# Patient Record
Sex: Male | Born: 1950
Health system: Southern US, Community
[De-identification: ages and names within clinical notes are randomized; demographics above are authoritative.]

## PROBLEM LIST (undated history)

## (undated) ENCOUNTER — Emergency Department: Discharge status: Absent without leave | Payer: PPO

## (undated) DIAGNOSIS — M1990 Unspecified osteoarthritis, unspecified site: Secondary | ICD-10-CM

## (undated) DIAGNOSIS — I340 Nonrheumatic mitral (valve) insufficiency: Secondary | ICD-10-CM

## (undated) DIAGNOSIS — M5030 Other cervical disc degeneration, unspecified cervical region: Secondary | ICD-10-CM

## (undated) DIAGNOSIS — M069 Rheumatoid arthritis, unspecified: Secondary | ICD-10-CM

## (undated) DIAGNOSIS — R771 Abnormality of globulin: Secondary | ICD-10-CM

## (undated) DIAGNOSIS — J45909 Unspecified asthma, uncomplicated: Secondary | ICD-10-CM

## (undated) DIAGNOSIS — N400 Enlarged prostate without lower urinary tract symptoms: Secondary | ICD-10-CM

## (undated) DIAGNOSIS — F325 Major depressive disorder, single episode, in full remission: Secondary | ICD-10-CM

## (undated) DIAGNOSIS — G43909 Migraine, unspecified, not intractable, without status migrainosus: Secondary | ICD-10-CM

## (undated) DIAGNOSIS — E782 Mixed hyperlipidemia: Secondary | ICD-10-CM

## (undated) DIAGNOSIS — G4733 Obstructive sleep apnea (adult) (pediatric): Secondary | ICD-10-CM

## (undated) DIAGNOSIS — F32A Depression, unspecified: Secondary | ICD-10-CM

## (undated) DIAGNOSIS — J439 Emphysema, unspecified: Secondary | ICD-10-CM

## (undated) DIAGNOSIS — F419 Anxiety disorder, unspecified: Secondary | ICD-10-CM

## (undated) DIAGNOSIS — M542 Cervicalgia: Secondary | ICD-10-CM

## (undated) DIAGNOSIS — J449 Chronic obstructive pulmonary disease, unspecified: Secondary | ICD-10-CM

## (undated) DIAGNOSIS — I10 Essential (primary) hypertension: Secondary | ICD-10-CM

## (undated) DIAGNOSIS — M48062 Spinal stenosis, lumbar region with neurogenic claudication: Secondary | ICD-10-CM

## (undated) DIAGNOSIS — F329 Major depressive disorder, single episode, unspecified: Secondary | ICD-10-CM

## (undated) DIAGNOSIS — I872 Venous insufficiency (chronic) (peripheral): Secondary | ICD-10-CM

## (undated) DIAGNOSIS — I2510 Atherosclerotic heart disease of native coronary artery without angina pectoris: Secondary | ICD-10-CM

## (undated) DIAGNOSIS — M5136 Other intervertebral disc degeneration, lumbar region: Secondary | ICD-10-CM

## (undated) DIAGNOSIS — M79673 Pain in unspecified foot: Secondary | ICD-10-CM

## (undated) DIAGNOSIS — M47816 Spondylosis without myelopathy or radiculopathy, lumbar region: Secondary | ICD-10-CM

## (undated) DIAGNOSIS — M1710 Unilateral primary osteoarthritis, unspecified knee: Secondary | ICD-10-CM

## (undated) HISTORY — DX: Obstructive sleep apnea (adult) (pediatric): G47.33

## (undated) HISTORY — DX: Cervicalgia: M54.2

## (undated) HISTORY — DX: Mixed hyperlipidemia: E78.2

## (undated) HISTORY — PX: OTHER SURGICAL HISTORY: SHX169

## (undated) HISTORY — PX: CARDIAC CATHETERIZATION: SHX172

## (undated) HISTORY — DX: Other cervical disc degeneration, unspecified cervical region: M50.30

## (undated) HISTORY — PX: NASAL SEPTUM SURGERY: SHX37

## (undated) HISTORY — DX: Essential (primary) hypertension: I10

## (undated) HISTORY — DX: Atherosclerotic heart disease of native coronary artery without angina pectoris: I25.10

## (undated) HISTORY — DX: Depression, unspecified: F32.A

## (undated) HISTORY — DX: Spondylosis without myelopathy or radiculopathy, lumbar region: M47.816

## (undated) HISTORY — DX: Unilateral primary osteoarthritis, unspecified knee: M17.10

## (undated) HISTORY — DX: Other intervertebral disc degeneration, lumbar region: M51.36

## (undated) HISTORY — DX: Major depressive disorder, single episode, in full remission: F32.5

## (undated) HISTORY — PX: CHOLECYSTECTOMY: SHX55

## (undated) HISTORY — DX: Spinal stenosis, lumbar region with neurogenic claudication: M48.062

## (undated) HISTORY — DX: Pain in unspecified foot: M79.673

## (undated) HISTORY — PX: KNEE SURGERY: SHX244

## (undated) HISTORY — DX: Major depressive disorder, single episode, unspecified: F32.9

## (undated) HISTORY — DX: Venous insufficiency (chronic) (peripheral): I87.2

## (undated) HISTORY — PX: COLONOSCOPY: SHX174

---

## 2002-03-12 ENCOUNTER — Encounter: Payer: Self-pay | Admitting: Cardiology

## 2002-03-12 ENCOUNTER — Encounter: Admission: RE | Admit: 2002-03-12 | Discharge: 2002-03-12 | Payer: Self-pay | Admitting: Cardiology

## 2004-06-27 ENCOUNTER — Ambulatory Visit: Payer: Self-pay | Admitting: Anesthesiology

## 2004-09-21 ENCOUNTER — Ambulatory Visit: Payer: Self-pay | Admitting: Anesthesiology

## 2004-10-04 ENCOUNTER — Ambulatory Visit: Payer: Self-pay | Admitting: Anesthesiology

## 2004-10-26 ENCOUNTER — Ambulatory Visit: Payer: Self-pay | Admitting: Anesthesiology

## 2004-11-02 ENCOUNTER — Ambulatory Visit: Payer: Self-pay | Admitting: Anesthesiology

## 2004-11-04 ENCOUNTER — Ambulatory Visit: Payer: Self-pay | Admitting: Gastroenterology

## 2004-11-24 ENCOUNTER — Ambulatory Visit: Payer: Self-pay | Admitting: Anesthesiology

## 2005-12-12 ENCOUNTER — Ambulatory Visit: Payer: Self-pay | Admitting: Unknown Physician Specialty

## 2006-08-22 ENCOUNTER — Ambulatory Visit: Payer: Self-pay | Admitting: Unknown Physician Specialty

## 2006-10-10 ENCOUNTER — Ambulatory Visit: Payer: Self-pay | Admitting: Unknown Physician Specialty

## 2008-04-24 ENCOUNTER — Ambulatory Visit: Payer: Self-pay | Admitting: Unknown Physician Specialty

## 2008-07-30 ENCOUNTER — Ambulatory Visit: Payer: Self-pay | Admitting: Internal Medicine

## 2008-09-01 ENCOUNTER — Ambulatory Visit: Payer: Self-pay | Admitting: Internal Medicine

## 2009-03-29 ENCOUNTER — Ambulatory Visit: Payer: Self-pay | Admitting: Physician Assistant

## 2009-06-29 ENCOUNTER — Ambulatory Visit: Payer: Self-pay | Admitting: Anesthesiology

## 2009-08-05 ENCOUNTER — Ambulatory Visit: Payer: Self-pay | Admitting: Anesthesiology

## 2009-08-26 ENCOUNTER — Ambulatory Visit: Payer: Self-pay | Admitting: Anesthesiology

## 2009-10-06 ENCOUNTER — Ambulatory Visit: Payer: Self-pay | Admitting: Anesthesiology

## 2010-11-23 ENCOUNTER — Ambulatory Visit: Payer: Self-pay

## 2010-11-29 ENCOUNTER — Ambulatory Visit: Payer: Self-pay | Admitting: Gastroenterology

## 2010-12-02 LAB — PATHOLOGY REPORT

## 2012-03-14 ENCOUNTER — Ambulatory Visit: Payer: Self-pay | Admitting: Internal Medicine

## 2014-02-16 DIAGNOSIS — E782 Mixed hyperlipidemia: Secondary | ICD-10-CM

## 2014-02-16 DIAGNOSIS — I2510 Atherosclerotic heart disease of native coronary artery without angina pectoris: Secondary | ICD-10-CM | POA: Insufficient documentation

## 2014-02-16 HISTORY — DX: Atherosclerotic heart disease of native coronary artery without angina pectoris: I25.10

## 2014-02-16 HISTORY — DX: Mixed hyperlipidemia: E78.2

## 2014-03-10 DIAGNOSIS — F325 Major depressive disorder, single episode, in full remission: Secondary | ICD-10-CM | POA: Insufficient documentation

## 2014-03-10 HISTORY — DX: Major depressive disorder, single episode, in full remission: F32.5

## 2014-04-13 DIAGNOSIS — R739 Hyperglycemia, unspecified: Secondary | ICD-10-CM | POA: Insufficient documentation

## 2014-04-13 DIAGNOSIS — E118 Type 2 diabetes mellitus with unspecified complications: Secondary | ICD-10-CM | POA: Insufficient documentation

## 2014-08-12 DIAGNOSIS — M179 Osteoarthritis of knee, unspecified: Secondary | ICD-10-CM

## 2014-08-12 DIAGNOSIS — M1710 Unilateral primary osteoarthritis, unspecified knee: Secondary | ICD-10-CM

## 2014-08-12 DIAGNOSIS — M1990 Unspecified osteoarthritis, unspecified site: Secondary | ICD-10-CM | POA: Insufficient documentation

## 2014-08-12 HISTORY — DX: Osteoarthritis of knee, unspecified: M17.9

## 2014-08-12 HISTORY — DX: Unilateral primary osteoarthritis, unspecified knee: M17.10

## 2015-01-25 ENCOUNTER — Other Ambulatory Visit: Payer: Self-pay | Admitting: Physical Medicine and Rehabilitation

## 2015-01-25 DIAGNOSIS — M5416 Radiculopathy, lumbar region: Secondary | ICD-10-CM

## 2015-02-05 ENCOUNTER — Ambulatory Visit
Admission: RE | Admit: 2015-02-05 | Discharge: 2015-02-05 | Disposition: A | Discharge status: Home / Self Care | Payer: PPO | Source: Ambulatory Visit | Attending: Physical Medicine and Rehabilitation | Admitting: Physical Medicine and Rehabilitation

## 2015-02-05 DIAGNOSIS — M5416 Radiculopathy, lumbar region: Secondary | ICD-10-CM

## 2015-02-05 DIAGNOSIS — M4806 Spinal stenosis, lumbar region: Secondary | ICD-10-CM | POA: Diagnosis not present

## 2015-02-05 DIAGNOSIS — M47896 Other spondylosis, lumbar region: Secondary | ICD-10-CM | POA: Insufficient documentation

## 2015-02-05 DIAGNOSIS — G544 Lumbosacral root disorders, not elsewhere classified: Secondary | ICD-10-CM | POA: Diagnosis not present

## 2015-02-24 DIAGNOSIS — Z0000 Encounter for general adult medical examination without abnormal findings: Secondary | ICD-10-CM | POA: Insufficient documentation

## 2015-02-24 DIAGNOSIS — Z79899 Other long term (current) drug therapy: Secondary | ICD-10-CM | POA: Insufficient documentation

## 2015-05-11 DIAGNOSIS — M5416 Radiculopathy, lumbar region: Secondary | ICD-10-CM | POA: Diagnosis not present

## 2015-05-11 DIAGNOSIS — M4806 Spinal stenosis, lumbar region: Secondary | ICD-10-CM | POA: Diagnosis not present

## 2015-05-11 DIAGNOSIS — M5136 Other intervertebral disc degeneration, lumbar region: Secondary | ICD-10-CM | POA: Diagnosis not present

## 2015-05-18 DIAGNOSIS — M47816 Spondylosis without myelopathy or radiculopathy, lumbar region: Secondary | ICD-10-CM | POA: Diagnosis not present

## 2015-05-18 DIAGNOSIS — M4806 Spinal stenosis, lumbar region: Secondary | ICD-10-CM | POA: Diagnosis not present

## 2015-05-18 DIAGNOSIS — M545 Low back pain: Secondary | ICD-10-CM | POA: Diagnosis not present

## 2015-05-18 DIAGNOSIS — M5136 Other intervertebral disc degeneration, lumbar region: Secondary | ICD-10-CM | POA: Diagnosis not present

## 2015-06-09 DIAGNOSIS — I25119 Atherosclerotic heart disease of native coronary artery with unspecified angina pectoris: Secondary | ICD-10-CM | POA: Diagnosis not present

## 2015-06-09 DIAGNOSIS — J069 Acute upper respiratory infection, unspecified: Secondary | ICD-10-CM | POA: Diagnosis not present

## 2015-06-24 DIAGNOSIS — R05 Cough: Secondary | ICD-10-CM | POA: Diagnosis not present

## 2015-06-24 DIAGNOSIS — E782 Mixed hyperlipidemia: Secondary | ICD-10-CM | POA: Diagnosis not present

## 2015-06-24 DIAGNOSIS — I10 Essential (primary) hypertension: Secondary | ICD-10-CM

## 2015-06-24 DIAGNOSIS — I2510 Atherosclerotic heart disease of native coronary artery without angina pectoris: Secondary | ICD-10-CM | POA: Diagnosis not present

## 2015-06-24 HISTORY — DX: Essential (primary) hypertension: I10

## 2015-08-11 DIAGNOSIS — J40 Bronchitis, not specified as acute or chronic: Secondary | ICD-10-CM | POA: Diagnosis not present

## 2015-11-16 DIAGNOSIS — J209 Acute bronchitis, unspecified: Secondary | ICD-10-CM | POA: Diagnosis not present

## 2015-11-16 DIAGNOSIS — R05 Cough: Secondary | ICD-10-CM | POA: Diagnosis not present

## 2016-01-03 DIAGNOSIS — I2510 Atherosclerotic heart disease of native coronary artery without angina pectoris: Secondary | ICD-10-CM | POA: Diagnosis not present

## 2016-01-03 DIAGNOSIS — E782 Mixed hyperlipidemia: Secondary | ICD-10-CM | POA: Diagnosis not present

## 2016-01-03 DIAGNOSIS — I10 Essential (primary) hypertension: Secondary | ICD-10-CM | POA: Diagnosis not present

## 2016-01-03 DIAGNOSIS — Z0000 Encounter for general adult medical examination without abnormal findings: Secondary | ICD-10-CM | POA: Diagnosis not present

## 2016-04-18 DIAGNOSIS — M48062 Spinal stenosis, lumbar region with neurogenic claudication: Secondary | ICD-10-CM

## 2016-04-18 DIAGNOSIS — Z0289 Encounter for other administrative examinations: Secondary | ICD-10-CM | POA: Insufficient documentation

## 2016-04-18 DIAGNOSIS — M5030 Other cervical disc degeneration, unspecified cervical region: Secondary | ICD-10-CM | POA: Insufficient documentation

## 2016-04-18 DIAGNOSIS — G894 Chronic pain syndrome: Secondary | ICD-10-CM | POA: Insufficient documentation

## 2016-04-18 DIAGNOSIS — M5136 Other intervertebral disc degeneration, lumbar region: Secondary | ICD-10-CM | POA: Insufficient documentation

## 2016-04-18 HISTORY — DX: Spinal stenosis, lumbar region with neurogenic claudication: M48.062

## 2016-07-05 DIAGNOSIS — R5383 Other fatigue: Secondary | ICD-10-CM | POA: Diagnosis not present

## 2016-07-05 DIAGNOSIS — F325 Major depressive disorder, single episode, in full remission: Secondary | ICD-10-CM | POA: Diagnosis not present

## 2016-07-05 DIAGNOSIS — G4733 Obstructive sleep apnea (adult) (pediatric): Secondary | ICD-10-CM | POA: Insufficient documentation

## 2016-07-05 DIAGNOSIS — R739 Hyperglycemia, unspecified: Secondary | ICD-10-CM | POA: Diagnosis not present

## 2016-07-05 DIAGNOSIS — I2510 Atherosclerotic heart disease of native coronary artery without angina pectoris: Secondary | ICD-10-CM | POA: Diagnosis not present

## 2016-07-05 DIAGNOSIS — E782 Mixed hyperlipidemia: Secondary | ICD-10-CM | POA: Diagnosis not present

## 2016-07-05 DIAGNOSIS — M1990 Unspecified osteoarthritis, unspecified site: Secondary | ICD-10-CM | POA: Diagnosis not present

## 2016-07-05 DIAGNOSIS — I10 Essential (primary) hypertension: Secondary | ICD-10-CM | POA: Diagnosis not present

## 2016-07-05 HISTORY — DX: Obstructive sleep apnea (adult) (pediatric): G47.33

## 2016-09-06 DIAGNOSIS — R5383 Other fatigue: Secondary | ICD-10-CM | POA: Diagnosis not present

## 2016-09-08 DIAGNOSIS — G4733 Obstructive sleep apnea (adult) (pediatric): Secondary | ICD-10-CM | POA: Diagnosis not present

## 2016-11-07 DIAGNOSIS — I10 Essential (primary) hypertension: Secondary | ICD-10-CM | POA: Diagnosis not present

## 2016-11-07 DIAGNOSIS — I2510 Atherosclerotic heart disease of native coronary artery without angina pectoris: Secondary | ICD-10-CM | POA: Diagnosis not present

## 2016-11-07 DIAGNOSIS — I872 Venous insufficiency (chronic) (peripheral): Secondary | ICD-10-CM | POA: Diagnosis not present

## 2016-11-07 DIAGNOSIS — E782 Mixed hyperlipidemia: Secondary | ICD-10-CM | POA: Diagnosis not present

## 2016-11-07 HISTORY — DX: Venous insufficiency (chronic) (peripheral): I87.2

## 2017-01-22 ENCOUNTER — Ambulatory Visit
Payer: PPO | Attending: Student in an Organized Health Care Education/Training Program | Admitting: Student in an Organized Health Care Education/Training Program

## 2017-01-22 ENCOUNTER — Encounter: Payer: Self-pay | Admitting: Student in an Organized Health Care Education/Training Program

## 2017-01-22 VITALS — BP 141/97 | HR 91 | Temp 98.0°F | Resp 18 | Ht 68.0 in | Wt 213.0 lb

## 2017-01-22 DIAGNOSIS — F329 Major depressive disorder, single episode, unspecified: Secondary | ICD-10-CM | POA: Diagnosis not present

## 2017-01-22 DIAGNOSIS — M51369 Other intervertebral disc degeneration, lumbar region without mention of lumbar back pain or lower extremity pain: Secondary | ICD-10-CM | POA: Insufficient documentation

## 2017-01-22 DIAGNOSIS — M48061 Spinal stenosis, lumbar region without neurogenic claudication: Secondary | ICD-10-CM | POA: Insufficient documentation

## 2017-01-22 DIAGNOSIS — G894 Chronic pain syndrome: Secondary | ICD-10-CM | POA: Insufficient documentation

## 2017-01-22 DIAGNOSIS — M5136 Other intervertebral disc degeneration, lumbar region: Secondary | ICD-10-CM | POA: Diagnosis not present

## 2017-01-22 DIAGNOSIS — M5030 Other cervical disc degeneration, unspecified cervical region: Secondary | ICD-10-CM

## 2017-01-22 DIAGNOSIS — M47816 Spondylosis without myelopathy or radiculopathy, lumbar region: Secondary | ICD-10-CM | POA: Insufficient documentation

## 2017-01-22 DIAGNOSIS — Z7982 Long term (current) use of aspirin: Secondary | ICD-10-CM | POA: Insufficient documentation

## 2017-01-22 HISTORY — DX: Other intervertebral disc degeneration, lumbar region without mention of lumbar back pain or lower extremity pain: M51.369

## 2017-01-22 HISTORY — DX: Other cervical disc degeneration, unspecified cervical region: M50.30

## 2017-01-22 HISTORY — DX: Spondylosis without myelopathy or radiculopathy, lumbar region: M47.816

## 2017-01-22 HISTORY — DX: Other intervertebral disc degeneration, lumbar region: M51.36

## 2017-01-22 NOTE — Patient Instructions (Addendum)
1. UDS today 2. Schedule for bilateral lumbar facets  Facet Blocks Patient Information  Description: The facets are joints in the spine between the vertebrae.  Like any joints in the body, facets can become irritated and painful.  Arthritis can also effect the facets.  By injecting steroids and local anesthetic in and around these joints, we can temporarily block the nerve supply to them.  Steroids act directly on irritated nerves and tissues to reduce selling and inflammation which often leads to decreased pain.  Facet blocks may be done anywhere along the spine from the neck to the low back depending upon the location of your pain.   After numbing the skin with local anesthetic (like Novocaine), a small needle is passed onto the facet joints under x-ray guidance.  You may experience a sensation of pressure while this is being done.  The entire block usually lasts about 15-25 minutes.   Conditions which may be treated by facet blocks:   Low back/buttock pain  Neck/shoulder pain  Certain types of headaches  Preparation for the injection:  1. Do not eat any solid food or dairy products within 8 hours of your appointment. 2. You may drink clear liquid up to 3 hours before appointment.  Clear liquids include water, black coffee, juice or soda.  No milk or cream please. 3. You may take your regular medication, including pain medications, with a sip of water before your appointment.  Diabetics should hold regular insulin (if taken separately) and take 1/2 normal NPH dose the morning of the procedure.  Carry some sugar containing items with you to your appointment. 4. A driver must accompany you and be prepared to drive you home after your procedure. 5. Bring all your current medications with you. 6. An IV may be inserted and sedation may be given at the discretion of the physician. 7. A blood pressure cuff, EKG and other monitors will often be applied during the procedure.  Some patients may  need to have extra oxygen administered for a short period. 8. You will be asked to provide medical information, including your allergies and medications, prior to the procedure.  We must know immediately if you are taking blood thinners (like Coumadin/Warfarin) or if you are allergic to IV iodine contrast (dye).  We must know if you could possible be pregnant.  Possible side-effects:   Bleeding from needle site  Infection (rare, may require surgery)  Nerve injury (rare)  Numbness & tingling (temporary)  Difficulty urinating (rare, temporary)  Spinal headache (a headache worse with upright posture)  Light-headedness (temporary)  Pain at injection site (serveral days)  Decreased blood pressure (rare, temporary)  Weakness in arm/leg (temporary)  Pressure sensation in back/neck (temporary)   Call if you experience:   Fever/chills associated with headache or increased back/neck pain  Headache worsened by an upright position  New onset, weakness or numbness of an extremity below the injection site  Hives or difficulty breathing (go to the emergency room)  Inflammation or drainage at the injection site(s)  Severe back/neck pain greater than usual  New symptoms which are concerning to you  Please note:  Although the local anesthetic injected can often make your back or neck feel good for several hours after the injection, the pain will likely return. It takes 3-7 days for steroids to work.  You may not notice any pain relief for at least one week.  If effective, we will often do a series of 2-3 injections spaced 3-6 weeks  apart to maximally decrease your pain.  After the initial series, you may be a candidate for a more permanent nerve block of the facets.  If you have any questions, please call #[phone] [facility]

## 2017-01-22 NOTE — Progress Notes (Signed)
Patient's Name: Gary [name]  MRN: [id number]  Referring Provider: Verline Lema, MD  DOB: [birth year]  PCP: Kirk Ruths, MD  DOS: 01/22/2017  Note by: Gillis Santa, MD  Service setting: Ambulatory outpatient  Specialty: Interventional Pain Management  Location: [facility] (AMB) Pain Management Facility  Visit type: Initial Patient Evaluation  Patient type: New Patient   Primary Reason(s) for Visit: Encounter for initial evaluation of one or more chronic problems (new to examiner) potentially causing chronic pain, and posing a threat to normal musculoskeletal function. (Level of risk: High) CC: Back Pain (lower)  HPI  Gary [name] is a 66 y.o. year old, male patient, who comes today to see [place] for the first time for an initial evaluation of his chronic pain. He has Spondylosis without myelopathy or radiculopathy, lumbar region; Lumbar degenerative disc disease; Degenerative disc disease, cervical; Chronic pain syndrome; and Facet arthropathy, lumbar on his problem list. Today he comes in for evaluation of his Back Pain (lower)  Pain Assessment: Location: Lower Back Radiating: both buttocks and down both legs to the ankles Onset: More than a month ago Duration: Chronic pain Quality: Constant (annoying) Severity: 7 /10 (self-reported pain score)  Note: Reported level is compatible with observation.                   When using our objective Pain Scale, levels between 6 and 10/10 are said to belong in an emergency room, as it progressively worsens from a 6/10, described as severely limiting, requiring emergency care not usually available at an outpatient pain management facility. At a 6/10 level, communication becomes difficult and requires great effort. Assistance to reach the emergency department may be required. Facial flushing and profuse sweating along with potentially dangerous increases in heart rate and blood pressure will be evident. Effect on ADL: difficulty doing daily  activities Timing: Constant Modifying factors: nothing  Onset and Duration: Present longer than 3 months Cause of pain: Work related accident or event Severity: Getting worse, NAS-11 at its worse: 8/10, NAS-11 at its best: 86/10, NAS-11 now: 7/10 and NAS-11 on the average: 8/10 Timing: Morning, Noon, Afternoon, Evening, Night, Not influenced by the time of the day, During activity or exercise, After activity or exercise and After a period of immobility Aggravating Factors: Bending, Kneeling, Lifiting, Squatting and Stooping  Alleviating Factors: nothing alleviates pain Associated Problems: Depression, Swelling and Pain that wakes patient up   Quality of Pain: Aching, Annoying and Uncomfortable Previous Examinations or Tests: MRI scan Previous Treatments: Chiropractic manipulations, Epidural steroid injections, Narcotic medications, Physical Therapy, Stretching exercises and TENS  The patient comes into the clinics today for the first time for a chronic pain management evaluation.    66 year old male with a history of chronic low back pain bilaterally that extends into the bilateral buttocks and posterior thighs and occasionally radiates past his knee into his toes. Patient states that he was in the [place] [employer] and was an Engineer, technical sales for approximately 8 years and that his occupation has resulted in his worsening low back pain and disability. Patient has had lumbar epidural steroid injections in the past, 8 total. He has lumbar spinal stenosis, lumbar degenerative disc disease and lumbar spondylosis as well as cervical spondylosis, And degenerative disc disease. Patient is status post left knee replacement and endorses bilateral knee pain.  Patient is currently on hydrocodone 10 mg up to 3 times a day as needed for severe pain. Patient is also on Cymbalta 60 mg, ibuprofen  400 mg couple of times a week and tramadol 50 mg for breakthrough pain. He is done physical therapy in the past and tries  to do some of those exercises at home.  Today I took the time to provide the patient with information regarding my pain practice. The patient was informed that my practice is divided into two sections: an interventional pain management section, as well as a completely separate and distinct medication management section. I explained that I have procedure days for my interventional therapies, and evaluation days for follow-ups and medication management. Because of the amount of documentation required during both, they are kept separated. This means that there is the possibility that he may be scheduled for a procedure on one day, and medication management the next. I have also informed him that because of staffing and facility limitations, I no longer take patients for medication management only. To illustrate the reasons for this, I gave the patient the example of surgeons, and how inappropriate it would be to refer a patient to his/her care, just to write for the post-surgical antibiotics on a surgery done by a different surgeon.   Because interventional pain management is my board-certified specialty, the patient was informed that joining my practice means that they are open to any and all interventional therapies. I made it clear that this does not mean that they will be forced to have any procedures done. What this means is that I believe interventional therapies to be essential part of the diagnosis and proper management of chronic pain conditions. Therefore, patients not interested in these interventional alternatives will be better served under the care of a different practitioner.  The patient was also made aware of my Comprehensive Pain Management Safety Guidelines where by joining my practice, they limit all of their nerve blocks and joint injections to those done by our practice, for as long as we are retained to manage their care.   Historic Controlled Substance Pharmacotherapy Review  PMP and  historical list of controlled substances: hydrocodone 10 mg, quantity 90, last fill 01/11/2017 MME/day: 49m/day Medications: The patient did not bring the medication(s) to the appointment, as requested in our "New Patient Package" Pharmacodynamics: Desired effects: Analgesia: The patient reports >50% benefit. Reported improvement in function: The patient reports medication allows him to accomplish basic ADLs. Clinically meaningful improvement in function (CMIF): Sustained CMIF goals met Perceived effectiveness: Described as relatively effective, allowing for increase in activities of daily living (ADL) Undesirable effects: Side-effects or Adverse reactions: None reported Historical Monitoring: The patient  reports that he does not use drugs. List of all UDS Test(s): No results found for: MDMA, COCAINSCRNUR, PCPSCRNUR, PCPQUANT, CANNABQUANT, THCU, EWynantskillList of all Serum Drug Screening Test(s):  No results found for: AMPHSCRSER, BARBSCRSER, BENZOSCRSER, COCAINSCRSER, PCPSCRSER, PCPQUANT, THCSCRSER, CANNABQUANT, OPIATESCRSER, OXYSCRSER, PROPOXSCRSER Historical Background Evaluation: [place] PMP: Six (6) year initial data search conducted.             PMP NARX Score Report:  Narcotic: 431 Sedative: 160 Stimulant: 0  Department of public safety, offender search: (Editor, commissioningInformation) Non-contributory Risk Assessment Profile: Aberrant behavior: None observed or detected today Risk factors for fatal opioid overdose: None identified today PMP NARX Overdose Risk Score: 210 Fatal overdose hazard ratio (HR): Calculation deferred Non-fatal overdose hazard ratio (HR): Calculation deferred Risk of opioid abuse or dependence: 0.7-3.0% with doses ? 36 MME/day and 6.1-26% with doses ? 120 MME/day. Substance use disorder (SUD) risk level: Moderate Opioid risk tool (ORT) (Total Score): 4  Opioid Risk Tool - 01/22/17 1034      Family History of Substance Abuse   Alcohol Positive Male   Illegal  Drugs Negative   Rx Drugs Negative     Personal History of Substance Abuse   Alcohol Negative   Illegal Drugs Negative   Rx Drugs Negative     Age   Age between 68-45 years  No     History of Preadolescent Sexual Abuse   History of Preadolescent Sexual Abuse Negative or Male     Psychological Disease   Psychological Disease Negative   Depression Positive     Total Score   Opioid Risk Tool Scoring 4   Opioid Risk Interpretation Moderate Risk     ORT Scoring interpretation table:  Score <3 = Low Risk for SUD  Score between 4-7 = Moderate Risk for SUD  Score >8 = High Risk for Opioid Abuse     Pharmacologic Plan: Continue therapy as ispatient prefers to continue medication management with primary care physician.            Initial impression: No immediate contraindications found.   Previous Procedures: 03/18/2015: Bilateral L3-4 transforaminal ESI (minimal relief) 02/25/2015: Bilateral L5-S1 transforaminal ESI (minimal relief) 08/26/2009: L3-4 interlaminar ESI 08/05/2009: L5-S1 interlaminar ESI 06/29/2009: L4-5 interlaminar ESI 11/24/2004: L3-4 interlaminar ESI 10/26/2004: L2-3 interlaminar ESI 06/27/2004: L4-5 interlaminar ESI He had multiple epidural steroid injections prior to 2006.  Meds   Current Outpatient Prescriptions:  .  aspirin 325 MG tablet, Take 325 mg by mouth daily., Disp: , Rfl:  .  buPROPion (WELLBUTRIN XL) 300 MG 24 hr tablet, Take 300 mg by mouth daily., Disp: , Rfl:  .  busPIRone (BUSPAR) 15 MG tablet, Take 15 mg by mouth 2 (two) times daily., Disp: , Rfl:  .  esomeprazole (NEXIUM) 20 MG packet, Take 20 mg by mouth daily before breakfast., Disp: , Rfl:  .  gabapentin (NEURONTIN) 300 MG capsule, Take 300 mg by mouth 3 (three) times daily., Disp: , Rfl:   Imaging Review  Cervical Imaging:  Cervical MR wo contrast:  Results for orders placed in visit on 11/02/04  MR C [facility] W/O Cm   Narrative   PRIOR REPORT IMPORTED FROM THE [facility]  WORKFLOW SYSTEM   REASON FOR EXAM:  Cervical radiculopathy  COMMENTS:   PROCEDURE:     MR  - MR CERVICAL SPINE WO CONT  - Nov 02 2004  2:59PM   RESULT:     Non-Gadolinium MR images of the cervical spine show severe  spinal stenosis at the C3-4 level with obliteration of the CSF signal on  the  sagittal and axial T2 weighted images. This appears to be a multifactorial  stenosis with bony spurring, facet hypertrophy and some diffuse disc bulge  contributing to the stenosis. There does appear to be foraminal stenosis  bilaterally at this level.   At the C4-5 level, there is similar stenosis to a lesser extent showing a  mild to moderate stenosis which is also multifactorial.   At the C5-6 level there is diffuse disc bulge, greater to the RIGHT,  slightly deforming the spinal cord. This appears to be combined with some  bony protrusion and facet hypertrophy. There is evidence of bilateral  foraminal narrowing, greater on the RIGHT than the LEFT, at this level.   At the C6-7 level there is moderate to severe spinal stenosis which also  appears to be multifactorial. There is disc space narrowing at these  levels.  No definite abnormal cord signal is seen.   IMPRESSION:     Multifocal/multilevel areas of spinal stenosis. There are  areas of foraminal stenosis present bilaterally at multiple levels.  Neurosurgical consultation and follow-up would be recommended. No evidence  of syrinx or abnormal cord signal. There is some mild cord deformity noted  at the C5-6 level.       Lumbosacral Imaging: Lumbar MR wo contrast:  Results for orders placed during the hospital encounter of 02/05/15  MR Lumbar Spine Wo Contrast   Narrative CLINICAL DATA:  Low back pain extending into both legs. Pain on and off for years.  EXAM: MRI LUMBAR SPINE WITHOUT CONTRAST  TECHNIQUE: Multiplanar, multisequence MR imaging of the lumbar spine was performed. No intravenous contrast was  administered.  COMPARISON:  11/23/2010.  FINDINGS: Segmentation: Normal.  Alignment: 3 mm anterolisthesis L4-5. 5 mm retrolisthesis L2-3. These are degenerative/ facet related.  Vertebrae: No worrisome osseous lesion.  Conus medullaris: Normal in size, signal, and location.  Paraspinal tissues: No evidence for hydronephrosis or paravertebral mass.  Disc levels:  L1-L2: Central and leftward protrusion. Mild facet arthropathy. LEFT subarticular zone and foraminal zone narrowing. LEFT L2 nerve root impingement is possible.  L2-L3: 5 mm retrolisthesis. Near-complete loss of interspace height. Osseous spurring predominantly from the inferior aspect of L2 contributes to severe spinal stenosis. Shallow central protrusion. Superimposed facet arthropathy and ligamentum flavum hypertrophy. BILATERAL L2 and L3 nerve root impingement.  L3-L4: Mild bulge. Asymmetric posterior element hypertrophy on the LEFT. Mild stenosis. Subarticular zone and foraminal zone narrowing is not established.  L4-L5: 3 mm anterolisthesis is facet mediated. There is moderate facet and severe ligamentum flavum hypertrophy. Nodular character to the LEFT ligamentum flavum suggests possible superimposed 6 mm synovial cyst. Severe spinal stenosis. Broad-based central protrusion. LEFT greater than RIGHT subarticular zone and foraminal zone narrowing affecting the L4 and L5 nerve root impingement.  L5-S1:  Mild bulge.  Moderate facet arthropathy.  No impingement.  Unremarkable SI joints and visualized pelvis.  Compared with 2012, there is progression of disease at L2-3 and L4-5.  IMPRESSION: Severe spinal stenosis at L2-3 is related to retrolisthesis, osseous spurring, disc material, and posterior element hypertrophy. BILATERAL L2 and L3 nerve root impingement.  Severe spinal stenosis is also present at L4-5, with 3 mm of anterolisthesis, posterior element hypertrophy, disc material, possible 6 mm LEFT  synovial cyst contribute to LEFT greater than RIGHT L4 and L5 nerve root impingement.  Multilevel spondylosis elsewhere, with possible subarticular zone narrowing at L1-2 on the LEFT. Disc disease.   Electronically Signed   By: Staci Righter M.D.   On: 02/05/2015 15:28    Lumbar MR wo contrast:  Results for orders placed in visit on 11/23/10  MR L Spine Ltd W/O Cm   Narrative   PRIOR REPORT IMPORTED FROM THE [facility] WORKFLOW SYSTEM   REASON FOR EXAM:    low back pain  COMMENTS:   PROCEDURE:     MR  - MR LUMBAR SPINE WO CONTRAST  - Nov 23 2010  8:47AM   RESULT:     This study was compared to a prior study dated 08/26/2009.   TECHNIQUE: Multiplanar and multisequence imaging of the lumbar spine was  obtained without the administration of gadolinium.   FINDINGS: Multiplanar and multisequence imaging of the lumbar spine was  obtained without the administration of gadolinium.   The conus medullaris terminates at an L1-L2 level. The cauda equina  demonstrate no evidence of clumping or thickening.  The osseous structures demonstrates moderate to severe Modic endplate  changes at the L2-L3 disc space level. There significant disc space  narrowing as well as endplate hypertrophic spurring. These findings appear  stable when compared to the previous study.   T12-L1 level, there is no evidence of thecal sac stenosis or  neuroforaminal  narrowing.   At the L1-L2 level, stable thecal sac narrowing is appreciated primarily  secondary to a broad-based disc bulge. The neural foramina at this level  appear unchanged and patent.   At the L2-L3 disc space level, severe multifactorial thecal sac stenosis  is  appreciated which appears slightly increased. This is secondary to a  broad-based disc bulge, endplate hypertrophic spurring as well as facet  and  ligamentum flavum hypertrophy. There is neuroforaminal narrowing on the  right which appears stable and appears the moderate to  severe.  Neuroforaminal narrowing on the left also appears to be moderate to severe  and is unchanged.   At the L3-L4 disc space level, there is no evidence of significant thecal  sac stenosis. Minimal neuroforaminal narrowing bilaterally is appreciated.  This appears to be secondary to facet hypertrophy.   At the L4-L5 disc space level, mild to moderate multifactorial thecal sac  stenosis is appreciated secondary to ligamentum flavum and facet  hypertrophy  as well as a broad-based disc bulge. There is mild bilateral  neuroforaminal  narrowing which appears stable.   At the L5-S1 level, mild subligamentous disc bulge is appreciated which  appears stable. There is no evidence of significant neuroforaminal  narrowing.   IMPRESSION:   Slight increase in thecal sac stenosis at the L2-3 disc space level and  otherwise no significant change in the lumbar spine when compared to  previous study.   Thank you for the opportunity to contribute to the care of your patient.         Complexity Note: Imaging results reviewed. Results shared with Mr. Kreiter, using Layman's terms.                         ROS  Cardiovascular History: Daily Aspirin intake Pulmonary or Respiratory History: Difficulty blowing air out (Emphysema), Shortness of breath, Smoking, Snoring , Coughing up mucus (Bronchitis) and Temporary stoppage of breathing during sleep Neurological History: No reported neurological signs or symptoms such as seizures, abnormal skin sensations, urinary and/or fecal incontinence, being born with an abnormal open spine and/or a tethered spinal cord Review of Past Neurological Studies: No results found for this or any previous visit. Psychological-Psychiatric History: Anxiousness and Depressed Gastrointestinal History: Vomiting blood (Ulcers), Heartburn due to stomach pushing into lungs (Hiatal hernia) and Reflux or heatburn Genitourinary History: No reported renal or genitourinary signs  or symptoms such as difficulty voiding or producing urine, peeing blood, non-functioning kidney, kidney stones, difficulty emptying the bladder, difficulty controlling the flow of urine, or chronic kidney disease Hematological History: No reported hematological signs or symptoms such as prolonged bleeding, low or poor functioning platelets, bruising or bleeding easily, hereditary bleeding problems, low energy levels due to low hemoglobin or being anemic Endocrine History: No reported endocrine signs or symptoms such as high or low blood sugar, rapid heart rate due to high thyroid levels, obesity or weight gain due to slow thyroid or thyroid disease Rheumatologic History: No reported rheumatological signs and symptoms such as fatigue, joint pain, tenderness, swelling, redness, heat, stiffness, decreased range of motion, with or without associated rash Musculoskeletal History: Negative for myasthenia  gravis, muscular dystrophy, multiple sclerosis or malignant hyperthermia Work History: Disabled  Allergies  Mr. Steig has No Known Allergies.  Laboratory Chemistry  Inflammation Markers (CRP: Acute Phase) (ESR: Chronic Phase) No results found for: CRP, ESRSEDRATE               Renal Function Markers No results found for: BUN, CREATININE, GFRAA, GFRNONAA               Hepatic Function Markers No results found for: AST, ALT, ALBUMIN, ALKPHOS, HCVAB               Electrolytes No results found for: NA, K, CL, CALCIUM, MG               Neuropathy Markers No results found for: [id number]               Bone Pathology Markers No results found for: Hendricks Milo, VD125OH2TOT, [id number], [id number], 25OHVITD1, 25OHVITD2, 25OHVITD3, CALCIUM, TESTOFREE, TESTOSTERONE               Coagulation Parameters No results found for: INR, LABPROT, APTT, PLT               Cardiovascular Markers No results found for: BNP, HGB, HCT               Note: Lab results reviewed.  [place]  Drug: Mr. Mathers   reports that he does not use drugs. Alcohol:  reports that he does not drink alcohol. Tobacco:  reports that he quit smoking about 4 years ago. He has never used smokeless tobacco. Medical:  has a past medical history of Depression. Family: family history includes Heart disease in his mother.  Past Surgical History:  Procedure Laterality Date  . CHOLECYSTECTOMY    . deviated septum repair    . KNEE SURGERY Left    Active Ambulatory Problems    Diagnosis Date Noted  . Spondylosis without myelopathy or radiculopathy, lumbar region 01/22/2017  . Lumbar degenerative disc disease 01/22/2017  . Degenerative disc disease, cervical 01/22/2017  . Chronic pain syndrome 01/22/2017  . Facet arthropathy, lumbar 01/22/2017   Resolved Ambulatory Problems    Diagnosis Date Noted  . No Resolved Ambulatory Problems   Past Medical History:  Diagnosis Date  . Depression    Constitutional Exam  General appearance: Well nourished, well developed, and well hydrated. In no apparent acute distress Vitals:   01/22/17 1025  BP: (!) 141/97  Pulse: 91  Resp: 18  Temp: 98 F (36.7 C)  TempSrc: Oral  SpO2: 95%  Weight: 213 lb (96.6 kg)  Height: '5\' 8"'  (1.727 m)   BMI Assessment: Estimated body mass index is 32.39 kg/m as calculated from the following:   Height as of this encounter: '5\' 8"'  (1.727 m).   Weight as of this encounter: 213 lb (96.6 kg).  BMI interpretation table: BMI level Category Range association with higher incidence of chronic pain  <18 kg/m2 Underweight   18.5-24.9 kg/m2 Ideal body weight   25-29.9 kg/m2 Overweight Increased incidence by 20%  30-34.9 kg/m2 Obese (Class I) Increased incidence by 68%  35-39.9 kg/m2 Severe obesity (Class II) Increased incidence by 136%  >40 kg/m2 Extreme obesity (Class III) Increased incidence by 254%   BMI Readings from Last 4 Encounters:  01/22/17 32.39 kg/m   Wt Readings from Last 4 Encounters:  01/22/17 213 lb (96.6 kg)  02/05/15 192 lb  (87.1 kg)  Psych/Mental status: Alert, oriented x 3 (person, place, & time)  Eyes: PERLA Respiratory: No evidence of acute respiratory distress  Cervical Spine Area Exam  Skin & Axial Inspection: No masses, redness, edema, swelling, or associated skin lesions Alignment: Symmetrical Functional ROM: Unrestricted ROM      Stability: No instability detected Muscle Tone/Strength: Functionally intact. No obvious neuro-muscular anomalies detected. Sensory (Neurological): Unimpaired Palpation: No palpable anomalies              Upper Extremity (UE) Exam    Side: Right upper extremity  Side: Left upper extremity  Skin & Extremity Inspection: Skin color, temperature, and hair growth are WNL. No peripheral edema or cyanosis. No masses, redness, swelling, asymmetry, or associated skin lesions. No contractures.  Skin & Extremity Inspection: Skin color, temperature, and hair growth are WNL. No peripheral edema or cyanosis. No masses, redness, swelling, asymmetry, or associated skin lesions. No contractures.  Functional ROM: Unrestricted ROM          Functional ROM: Unrestricted ROM          Muscle Tone/Strength: Functionally intact. No obvious neuro-muscular anomalies detected.  Muscle Tone/Strength: Functionally intact. No obvious neuro-muscular anomalies detected.  Sensory (Neurological): Unimpaired          Sensory (Neurological): Unimpaired          Palpation: No palpable anomalies              Palpation: No palpable anomalies              Specialized Test(s): Deferred         Specialized Test(s): Deferred          Thoracic Spine Area Exam  Skin & Axial Inspection: No masses, redness, or swelling Alignment: Symmetrical Functional ROM: Unrestricted ROM Stability: No instability detected Muscle Tone/Strength: Functionally intact. No obvious neuro-muscular anomalies detected. Sensory (Neurological): Unimpaired Muscle strength & Tone: No palpable anomalies  Lumbar Spine Area Exam  Skin &  Axial Inspection: No masses, redness, or swelling Alignment: Symmetrical Functional ROM: Unrestricted ROM      Stability: No instability detected Muscle Tone/Strength: Functionally intact. No obvious neuro-muscular anomalies detected. Sensory (Neurological): Unimpaired Palpation: Complains of area being tender to palpation Bilateral Fist Percussion Testoverlying lumbar facets Provocative Tests: Lumbar Hyperextension and rotation test: Positive bilaterally for facet joint pain. Lumbar Lateral bending test: Positive due to pain. Patrick's Maneuver: evaluation deferred today                    Gait & Posture Assessment  Ambulation: Unassisted Gait: Relatively normal for age and body habitus Posture: WNL   Lower Extremity Exam    Side: Right lower extremity  Side: Left lower extremity  Skin & Extremity Inspection: Skin color, temperature, and hair growth are WNL. No peripheral edema or cyanosis. No masses, redness, swelling, asymmetry, or associated skin lesions. No contractures.  Skin & Extremity Inspection: Skin color, temperature, and hair growth are WNL. No peripheral edema or cyanosis. No masses, redness, swelling, asymmetry, or associated skin lesions. No contractures.  Functional ROM: Unrestricted ROM          Functional ROM: Unrestricted ROM          Muscle Tone/Strength: Functionally intact. No obvious neuro-muscular anomalies detected.  Muscle Tone/Strength: Functionally intact. No obvious neuro-muscular anomalies detected.  Sensory (Neurological): Unimpaired  Sensory (Neurological): Unimpaired  Palpation: No palpable anomalies  Palpation: No palpable anomalies   Assessment  Primary Diagnosis & Pertinent Problem List: The primary encounter diagnosis was Spondylosis without myelopathy or radiculopathy, lumbar region. Diagnoses of Lumbar  degenerative disc disease, Degenerative disc disease, cervical, Chronic pain syndrome, and Facet arthropathy, lumbar were also pertinent to this  visit.  Visit Diagnosis (New problems to examiner): 1. Spondylosis without myelopathy or radiculopathy, lumbar region   2. Lumbar degenerative disc disease   3. Degenerative disc disease, cervical   4. Chronic pain syndrome   5. Facet arthropathy, lumbar    General Recommendations: The pain condition that the patient suffers from is best treated with a multidisciplinary approach that involves an increase in physical activity to prevent de-conditioning and worsening of the pain cycle, as well as psychological counseling (formal and/or informal) to address the co-morbid psychological affects of pain. Treatment will often involve judicious use of pain medications and interventional procedures to decrease the pain, allowing the patient to participate in the physical activity that will ultimately produce long-lasting pain reductions. The goal of the multidisciplinary approach is to return the patient to a higher level of overall function and to restore their ability to perform activities of daily living.  66 year old male with chronic axial low back pain that radiates into bilateral buttocks and thighs secondary to severe lumbar degenerative disc disease, lumbar stenosis, lumbar spondylosis. Patient also has neck pain related to cervical degenerative disc disease, bilateral knee pain status post left total knee arthroplasty. Primary pain complaint is axial low back. He states that the medications do help to a certain extent. He has had epidural steroid injections with variable effectiveness.  On exam today the patient has severe pain with facet loading and lateral rotation bilaterally. He has pain overlying the lumbar facets to palpation. His radiographic imaging is significant for lumbar spondylosis and lumbar facet arthropathy most pronounced at L3-L5 bilaterally. We discussed performing bilateral lumbar facet blocks with goal radiofrequency ablation if effective. Risks and benefits were discussed. Patient  agreement with plan was to proceed.  In regards to medication management, the patient is comfortable seeing his [place] provider in [place]. He prefers to continue medication management with them. This is reasonable. I will help the patient through interventional therapeutic modalities and if need be can provide medication recommendations for now continue as is.  Plan: -Urine drug screen today. -Bilateral lumbar facet blocks for lumbar spondylosis at L3/L4, L4/L5 and L5/S1 with sedation.  Ordered Lab-work, Procedure(s), Referral(s), & Consult(s): Orders Placed This Encounter  Procedures  . LUMBAR FACET(MEDIAL BRANCH NERVE BLOCK) MBNB  . Compliance Drug Analysis, Ur   Pharmacotherapy (current): Medications ordered:  No orders of the defined types were placed in this encounter.  Medications administered during this visit: Mr. Haug had no medications administered during this visit.   Pharmacological management options:  Continue medication management per PCP Interventional management options: Mr. Debold was informed that there is no guarantee that he would be a candidate for interventional therapies. The decision will be based on the results of diagnostic studies, as well as Mr. Abdelaziz risk profile.  Procedure(s) under consideration:  -bilateral lumbar facets -Lumbar epidural steroid injection -SI joint injection -Cervical medial branch nerve blocks   Provider-requested follow-up: Return for Procedure.  Future Appointments Date Time Provider [place]  01/29/2017 10:15 AM Gillis Santa, MD [facility] None    Primary Care Physician: Kirk Ruths, MD Location: [facility] Note by: Gillis Santa, M.D, Date: 01/22/2017; Time: 12:33 PM  Patient Instructions  1. UDS today 2. Schedule for bilateral lumbar facets  Facet Blocks Patient Information  Description: The facets are joints in the spine between the vertebrae.  Like any  joints in the body, facets can become irritated and painful.  Arthritis can also effect the facets.  By injecting steroids and local anesthetic in and around these joints, we can temporarily block the nerve supply to them.  Steroids act directly on irritated nerves and tissues to reduce selling and inflammation which often leads to decreased pain.  Facet blocks may be done anywhere along the spine from the neck to the low back depending upon the location of your pain.   After numbing the skin with local anesthetic (like Novocaine), a small needle is passed onto the facet joints under x-ray guidance.  You may experience a sensation of pressure while this is being done.  The entire block usually lasts about 15-25 minutes.   Conditions which may be treated by facet blocks:   Low back/buttock pain  Neck/shoulder pain  Certain types of headaches  Preparation for the injection:  1. Do not eat any solid food or dairy products within 8 hours of your appointment. 2. You may drink clear liquid up to 3 hours before appointment.  Clear liquids include water, black coffee, juice or soda.  No milk or cream please. 3. You may take your regular medication, including pain medications, with a sip of water before your appointment.  Diabetics should hold regular insulin (if taken separately) and take 1/2 normal NPH dose the morning of the procedure.  Carry some sugar containing items with you to your appointment. 4. A driver must accompany you and be prepared to drive you home after your procedure. 5. Bring all your current medications with you. 6. An IV may be inserted and sedation may be given at the discretion of the physician. 7. A blood pressure cuff, EKG and other monitors will often be applied during the procedure.  Some patients may need to have extra oxygen administered for a short period. 8. You will be asked to provide medical information, including your allergies and medications, prior to the procedure.   We must know immediately if you are taking blood thinners (like Coumadin/Warfarin) or if you are allergic to IV iodine contrast (dye).  We must know if you could possible be pregnant.  Possible side-effects:   Bleeding from needle site  Infection (rare, may require surgery)  Nerve injury (rare)  Numbness & tingling (temporary)  Difficulty urinating (rare, temporary)  Spinal headache (a headache worse with upright posture)  Light-headedness (temporary)  Pain at injection site (serveral days)  Decreased blood pressure (rare, temporary)  Weakness in arm/leg (temporary)  Pressure sensation in back/neck (temporary)   Call if you experience:   Fever/chills associated with headache or increased back/neck pain  Headache worsened by an upright position  New onset, weakness or numbness of an extremity below the injection site  Hives or difficulty breathing (go to the emergency room)  Inflammation or drainage at the injection site(s)  Severe back/neck pain greater than usual  New symptoms which are concerning to you  Please note:  Although the local anesthetic injected can often make your back or neck feel good for several hours after the injection, the pain will likely return. It takes 3-7 days for steroids to work.  You may not notice any pain relief for at least one week.  If effective, we will often do a series of 2-3 injections spaced 3-6 weeks apart to maximally decrease your pain.  After the initial series, you may be a candidate for a more permanent nerve block of the facets.  If you have  any questions, please call #[phone]) [facility]

## 2017-01-22 NOTE — Progress Notes (Signed)
Safety precautions to be maintained throughout the outpatient stay will include: orient to surroundings, keep bed in low position, maintain call bell within reach at all times, provide assistance with transfer out of bed and ambulation.  

## 2017-01-23 ENCOUNTER — Telehealth: Payer: Self-pay | Admitting: *Deleted

## 2017-01-23 ENCOUNTER — Other Ambulatory Visit: Payer: Self-pay

## 2017-01-23 NOTE — Telephone Encounter (Signed)
Patients wife called about the patient's pain. He was up all night, not able to get any relief. Please call asap.

## 2017-01-23 NOTE — Telephone Encounter (Signed)
Patient states having some neck pain and increased low back pain since yesterday's exam with Dr. Holley Raring. Wanted to know if the exam yesterday could have caused this. I explained that sometimes the exam may aggravate the symptoms but do not cause any harm to the patient. Instructed patient to use ice and take medications as prescribed. And to call us tomorrow or go to ED if persist.

## 2017-01-24 DIAGNOSIS — M542 Cervicalgia: Secondary | ICD-10-CM

## 2017-01-24 DIAGNOSIS — M545 Low back pain, unspecified: Secondary | ICD-10-CM | POA: Insufficient documentation

## 2017-01-24 HISTORY — DX: Cervicalgia: M54.2

## 2017-01-27 LAB — COMPLIANCE DRUG ANALYSIS, UR

## 2017-01-29 ENCOUNTER — Encounter: Payer: Self-pay | Admitting: Student in an Organized Health Care Education/Training Program

## 2017-01-29 ENCOUNTER — Ambulatory Visit (HOSPITAL_BASED_OUTPATIENT_CLINIC_OR_DEPARTMENT_OTHER): Payer: Non-veteran care | Admitting: Student in an Organized Health Care Education/Training Program

## 2017-01-29 ENCOUNTER — Ambulatory Visit
Admission: RE | Admit: 2017-01-29 | Discharge: 2017-01-29 | Disposition: A | Discharge status: Home / Self Care | Payer: Non-veteran care | Source: Ambulatory Visit | Attending: Student in an Organized Health Care Education/Training Program | Admitting: Student in an Organized Health Care Education/Training Program

## 2017-01-29 VITALS — BP 113/83 | HR 82 | Temp 96.4°F | Resp 14 | Ht 68.0 in | Wt 210.0 lb

## 2017-01-29 DIAGNOSIS — M5030 Other cervical disc degeneration, unspecified cervical region: Secondary | ICD-10-CM | POA: Insufficient documentation

## 2017-01-29 DIAGNOSIS — M47816 Spondylosis without myelopathy or radiculopathy, lumbar region: Secondary | ICD-10-CM

## 2017-01-29 DIAGNOSIS — G894 Chronic pain syndrome: Secondary | ICD-10-CM

## 2017-01-29 DIAGNOSIS — M5136 Other intervertebral disc degeneration, lumbar region: Secondary | ICD-10-CM | POA: Diagnosis not present

## 2017-01-29 MED ORDER — ROPIVACAINE HCL 2 MG/ML IJ SOLN
10.0000 mL | Freq: Once | INTRAMUSCULAR | Status: AC
  Administered 2017-01-29: 10 mL

## 2017-01-29 MED ORDER — LIDOCAINE HCL (PF) 1 % IJ SOLN
INTRAMUSCULAR | Status: AC
  Filled 2017-01-29: qty 10

## 2017-01-29 MED ORDER — FENTANYL CITRATE (PF) 100 MCG/2ML IJ SOLN
25.0000 ug | INTRAMUSCULAR | Status: DC | PRN
  Administered 2017-01-29: 50 ug via INTRAVENOUS

## 2017-01-29 MED ORDER — FENTANYL CITRATE (PF) 100 MCG/2ML IJ SOLN
INTRAMUSCULAR | Status: AC
  Filled 2017-01-29: qty 2

## 2017-01-29 MED ORDER — ROPIVACAINE HCL 2 MG/ML IJ SOLN
INTRAMUSCULAR | Status: AC
  Filled 2017-01-29: qty 10

## 2017-01-29 MED ORDER — LACTATED RINGERS IV SOLN
1000.0000 mL | Freq: Once | INTRAVENOUS | Status: AC
  Administered 2017-01-29: 1000 mL via INTRAVENOUS

## 2017-01-29 MED ORDER — LIDOCAINE HCL (PF) 1 % IJ SOLN
10.0000 mL | Freq: Once | INTRAMUSCULAR | Status: AC
  Administered 2017-01-29: 5 mL

## 2017-01-29 NOTE — Progress Notes (Signed)
Patient's Name: Gary [name]  MRN: [id number]  Referring Provider: Kirk Ruths, MD  DOB: 01/20/1951  PCP: Kirk Ruths, MD  DOS: 01/29/2017  Note by: Gillis Santa, MD  Service setting: Ambulatory outpatient  Specialty: Interventional Pain Management  Patient type: Established  Location: [facility] (AMB) Pain Management Facility  Visit type: Interventional Procedure   Primary Reason for Visit: Interventional Pain Management Treatment. CC: Back Pain (low)  Procedure:  Anesthesia, Analgesia, Anxiolysis:  Type: Diagnostic Medial Branch Facet Block #1 Region: Lumbar Level:  L3, L4, L5,  Medial Branch Level(s) Laterality: Bilateral  Type: Local Anesthesia with Moderate (Conscious) Sedation Local Anesthetic: Lidocaine 1% Route: Intravenous (IV) IV Access: Secured Sedation: Meaningful verbal contact was maintained at all times during the procedure  Indication(s): Analgesia and Anxiety   Indications: 1. Spondylosis without myelopathy or radiculopathy, lumbar region   2. Lumbar degenerative disc disease   3. Degenerative disc disease, cervical   4. Chronic pain syndrome   5. Facet arthropathy, lumbar    Pain Score: Pre-procedure: 8 /10 Post-procedure: 0-No pain/10  Pre-op Assessment:  Gary [name] is a 66 y.o. (year old), male patient, seen today for interventional treatment. He  has a past surgical history that includes Cholecystectomy; Knee surgery (Left); and deviated septum repair. Gary [name] has a current medication list which includes the following prescription(s): acetaminophen, aspirin, bupropion, buspirone, cholecalciferol, duloxetine, esomeprazole, fenofibrate, finasteride, gabapentin, hydrocodone-acetaminophen, albuterol sulfate, aspirin, bupropion, buspirone, duloxetine, esomeprazole, gabapentin, niacin-lovastatin, and pravastatin, and the following Facility-Administered Medications: fentanyl and lactated ringers. His primarily concern today is the Back Pain  (low)  Initial Vital Signs: Blood pressure 121/86, pulse 77, temperature 97.6 F (36.4 C), resp. rate 18, height 5\' 8"  (1.727 m), weight 210 lb (95.3 kg), SpO2 96 %. BMI: Estimated body mass index is 31.93 kg/m as calculated from the following:   Height as of this encounter: 5\' 8"  (1.727 m).   Weight as of this encounter: 210 lb (95.3 kg).  Risk Assessment: Allergies: Reviewed. He is allergic to niacin-lovastatin er; atorvastatin; simvastatin; and propoxyphene.  Allergy Precautions: None required Coagulopathies: Reviewed. None identified.  Blood-thinner therapy: None at this time Active Infection(s): Reviewed. None identified. Gary [name] is afebrile  Site Confirmation: Gary [name] was asked to confirm the procedure and laterality before marking the site Procedure checklist: Completed Consent: Before the procedure and under the influence of no sedative(s), amnesic(s), or anxiolytics, the patient was informed of the treatment options, risks and possible complications. To fulfill our ethical and legal obligations, as recommended by the American Medical Association's Code of Ethics, I have informed the patient of my clinical impression; the nature and purpose of the treatment or procedure; the risks, benefits, and possible complications of the intervention; the alternatives, including doing nothing; the risk(s) and benefit(s) of the alternative treatment(s) or procedure(s); and the risk(s) and benefit(s) of doing nothing. The patient was provided information about the general risks and possible complications associated with the procedure. These may include, but are not limited to: failure to achieve desired goals, infection, bleeding, organ or nerve damage, allergic reactions, paralysis, and death. In addition, the patient was informed of those risks and complications associated to Spine-related procedures, such as failure to decrease pain; infection (i.e.: Meningitis, epidural or intraspinal  abscess); bleeding (i.e.: epidural hematoma, subarachnoid hemorrhage, or any other type of intraspinal or peri-dural bleeding); organ or nerve damage (i.e.: Any type of peripheral nerve, nerve root, or spinal cord injury) with subsequent damage to sensory, motor, and/or autonomic systems, resulting  in permanent pain, numbness, and/or weakness of one or several areas of the body; allergic reactions; (i.e.: anaphylactic reaction); and/or death. Furthermore, the patient was informed of those risks and complications associated with the medications. These include, but are not limited to: allergic reactions (i.e.: anaphylactic or anaphylactoid reaction(s)); adrenal axis suppression; blood sugar elevation that in diabetics may result in ketoacidosis or comma; water retention that in patients with history of congestive heart failure may result in shortness of breath, pulmonary edema, and decompensation with resultant heart failure; weight gain; swelling or edema; medication-induced neural toxicity; particulate matter embolism and blood vessel occlusion with resultant organ, and/or nervous system infarction; and/or aseptic necrosis of one or more joints. Finally, the patient was informed that Medicine is not an exact science; therefore, there is also the possibility of unforeseen or unpredictable risks and/or possible complications that may result in a catastrophic outcome. The patient indicated having understood very clearly. We have given the patient no guarantees and we have made no promises. Enough time was given to the patient to ask questions, all of which were answered to the patient's satisfaction. Gary [name] has indicated that he wanted to continue with the procedure. Attestation: I, the ordering provider, attest that I have discussed with the patient the benefits, risks, side-effects, alternatives, likelihood of achieving goals, and potential problems during recovery for the procedure that I have provided  informed consent. Date: 01/29/2017; Time: 10:06 AM  Pre-Procedure Preparation:  Monitoring: As per clinic protocol. Respiration, ETCO2, SpO2, BP, heart rate and rhythm monitor placed and checked for adequate function Safety Precautions: Patient was assessed for positional comfort and pressure points before starting the procedure. Time-out: I initiated and conducted the "Time-out" before starting the procedure, as per protocol. The patient was asked to participate by confirming the accuracy of the "Time Out" information. Verification of the correct person, site, and procedure were performed and confirmed by me, the nursing staff, and the patient. "Time-out" conducted as per Joint Commission's Universal Protocol (UP.01.01.01). "Time-out" Date & Time: 01/29/2017; 1031 hrs.  Description of Procedure Process:   Position: Prone Target Area: For Lumbar Facet blocks, the target is the groove formed by the junction of the transverse process and superior articular process. For the L5 dorsal ramus, the target is the notch between superior articular process and sacral ala. Approach: Paramedial approach. Area Prepped: Entire Posterior Lumbosacral Region Prepping solution: ChloraPrep (2% chlorhexidine gluconate and 70% isopropyl alcohol) Safety Precautions: Aspiration looking for blood return was conducted prior to all injections. At no point did we inject any substances, as a needle was being advanced. No attempts were made at seeking any paresthesias. Safe injection practices and needle disposal techniques used. Medications properly checked for expiration dates. SDV (single dose vial) medications used. Description of the Procedure: Protocol guidelines were followed. The patient was placed in position over the fluoroscopy table. The target area was identified and the area prepped in the usual manner. Skin desensitized using vapocoolant spray. Skin & deeper tissues infiltrated with local anesthetic. Appropriate  amount of time allowed to pass for local anesthetics to take effect. The procedure needle was introduced through the skin, ipsilateral to the reported pain, and advanced to the target area. Employing the "Medial Branch Technique", the needles were advanced to the angle made by the superior and medial portion of the transverse process, and the lateral and inferior portion of the superior articulating process of the targeted vertebral bodies. This area is known as "Burton's Eye" or the "Eye of the  Scottish Dog". A procedure needle was introduced through the skin, and this time advanced to the angle made by the superior and medial border of the sacral ala, and the lateral border of the S1 vertebral body. Negative aspiration confirmed. Solution injected in intermittent fashion, asking for systemic symptoms every 0.5cc of injectate. The needles were then removed and the area cleansed, making sure to leave some of the prepping solution back to take advantage of its long term bactericidal properties.   Illustration of the posterior view of the lumbar spine and the posterior neural structures. Laminae of L2 through S1 are labeled. DPRL5, dorsal primary ramus of L5; DPRS1, dorsal primary ramus of S1; DPR3, dorsal primary ramus of L3; FJ, facet (zygapophyseal) joint L3-L4; I, inferior articular process of L4; LB1, lateral branch of dorsal primary ramus of L1; IAB, inferior articular branches from L3 medial branch (supplies L4-L5 facet joint); IBP, intermediate branch plexus; MB3, medial branch of dorsal primary ramus of L3; NR3, third lumbar nerve root; S, superior articular process of L5; SAB, superior articular branches from L4 (supplies L4-5 facet joint also); TP3, transverse process of L3.  Vitals:   01/29/17 1050 01/29/17 1055 01/29/17 1100 01/29/17 1110  BP: 114/76 92/63 94/83  113/83  Pulse: 80 79 82 80  Resp: 13 12 18 17   Temp:  (!) 96.4 F (35.8 C)    SpO2: 92% 94% 95% 95%  Weight:      Height:         Start Time: 1032 hrs. End Time: 1050 hrs. Materials:  Needle(s) Type: Regular needle Gauge: 22G Length: 3.5-in Medication(s): We administered lactated ringers, lidocaine (PF), ropivacaine (PF) 2 mg/mL (0.2%), and fentaNYL. Please see chart orders for dosing details. 1.5 cc of 0.2% ropivacaine at each level  Imaging Guidance (Spinal):  Type of Imaging Technique: Fluoroscopy Guidance (Spinal) Indication(s): Assistance in needle guidance and placement for procedures requiring needle placement in or near specific anatomical locations not easily accessible without such assistance. Exposure Time: Please see nurses notes. Contrast: None used. Fluoroscopic Guidance: I was personally present during the use of fluoroscopy. "Tunnel Vision Technique" used to obtain the best possible view of the target area. Parallax error corrected before commencing the procedure. "Direction-depth-direction" technique used to introduce the needle under continuous pulsed fluoroscopy. Once target was reached, antero-posterior, oblique, and lateral fluoroscopic projection used confirm needle placement in all planes. Images permanently stored in EMR. Interpretation: No contrast injected. I personally interpreted the imaging intraoperatively. Adequate needle placement confirmed in multiple planes. Permanent images saved into the patient's record.  Antibiotic Prophylaxis:  Indication(s): None identified Antibiotic given: None  Post-operative Assessment:  EBL: None Complications: No immediate post-treatment complications observed by team, or reported by patient. Note: The patient tolerated the entire procedure well. A repeat set of vitals were taken after the procedure and the patient was kept under observation following institutional policy, for this type of procedure. Post-procedural neurological assessment was performed, showing return to baseline, prior to discharge. The patient was provided with post-procedure discharge  instructions, including a section on how to identify potential problems. Should any problems arise concerning this procedure, the patient was given instructions to immediately contact us, at any time, without hesitation. In any case, we plan to contact the patient by telephone for a follow-up status report regarding this interventional procedure. Comments:  No additional relevant information. 5 out of 5 strength bilateral lower extremity: Plantar flexion, dorsiflexion, knee flexion, knee extension. Plan of Care    Imaging Orders  DG C-Arm 1-60 Min-No Report Procedure Orders    No procedure(s) ordered today    Medications ordered for procedure: Meds ordered this encounter  Medications  . lactated ringers infusion 1,000 mL  . lidocaine (PF) (XYLOCAINE) 1 % injection 10 mL  . ropivacaine (PF) 2 mg/mL (0.2%) (NAROPIN) injection 10 mL  . fentaNYL (SUBLIMAZE) injection 25-50 mcg    Make sure Narcan is available in the pyxis when using this medication. In the event of respiratory depression (RR< 8/min): Titrate NARCAN (naloxone) in increments of 0.1 to 0.2 mg IV at 2-3 minute intervals, until desired degree of reversal.   Medications administered: We administered lactated ringers, lidocaine (PF), ropivacaine (PF) 2 mg/mL (0.2%), and fentaNYL.  See the medical record for exact dosing, route, and time of administration.  New Prescriptions   No medications on file   Disposition: Discharge home  Discharge Date & Time: 01/29/2017;   hrs.   Physician-requested Follow-up: Return in about 2 weeks (around 02/12/2017) for Post Procedure Evaluation. No future appointments. Primary Care Physician: Kirk Ruths, MD Location: [facility] Outpatient Pain Management Facility Note by: Gillis Santa, MD Date: 01/29/2017; Time: 11:18 AM  Disclaimer:  Medicine is not an exact science. The only guarantee in medicine is that nothing is guaranteed. It is important to note that the decision to proceed  with this intervention was based on the information collected from the patient. The Data and conclusions were drawn from the patient's questionnaire, the interview, and the physical examination. Because the information was provided in large part by the patient, it cannot be guaranteed that it has not been purposely or unconsciously manipulated. Every effort has been made to obtain as much relevant data as possible for this evaluation. It is important to note that the conclusions that lead to this procedure are derived in large part from the available data. Always take into account that the treatment will also be dependent on availability of resources and existing treatment guidelines, considered by other Pain Management Practitioners as being common knowledge and practice, at the time of the intervention. For Medico-Legal purposes, it is also important to point out that variation in procedural techniques and pharmacological choices are the acceptable norm. The indications, contraindications, technique, and results of the above procedure should only be interpreted and judged by a Board-Certified Interventional Pain Specialist with extensive familiarity and expertise in the same exact procedure and technique.

## 2017-01-29 NOTE — Patient Instructions (Signed)
Pain Management Discharge Instructions  General Discharge Instructions :  If you need to reach your doctor call: Monday-Friday 8:00 am - 4:00 pm at [phone] or toll free [phone].  After clinic hours [phone] to have operator reach doctor.  Bring all of your medication bottles to all your appointments in the pain clinic.  To cancel or reschedule your appointment with Pain Management please remember to call 24 hours in advance to avoid a fee.  Refer to the educational materials which you have been given on: General Risks, I had my Procedure. Discharge Instructions, Post Sedation.  Post Procedure Instructions:  The drugs you were given will stay in your system until tomorrow, so for the next 24 hours you should not drive, make any legal decisions or drink any alcoholic beverages.  You may eat anything you prefer, but it is better to start with liquids then soups and crackers, and gradually work up to solid foods.  Please notify your doctor immediately if you have any unusual bleeding, trouble breathing or pain that is not related to your normal pain.  Depending on the type of procedure that was done, some parts of your body may feel week and/or numb.  This usually clears up by tonight or the next day.  Walk with the use of an assistive device or accompanied by an adult for the 24 hours.  You may use ice on the affected area for the first 24 hours.  Put ice in a Ziploc bag and cover with a towel and place against area 15 minutes on 15 minutes off.  You may switch to heat after 24 hours.GENERAL RISKS AND COMPLICATIONS  What are the risk, side effects and possible complications? Generally speaking, most procedures are safe.  However, with any procedure there are risks, side effects, and the possibility of complications.  The risks and complications are dependent upon the sites that are lesioned, or the type of nerve block to be performed.  The closer the procedure is to the spine,  the more serious the risks are.  Great care is taken when placing the radio frequency needles, block needles or lesioning probes, but sometimes complications can occur. 1. Infection: Any time there is an injection through the skin, there is a risk of infection.  This is why sterile conditions are used for these blocks.  There are four possible types of infection. 1. Localized skin infection. 2. Central Nervous System Infection-This can be in the form of Meningitis, which can be deadly. 3. Epidural Infections-This can be in the form of an epidural abscess, which can cause pressure inside of the spine, causing compression of the spinal cord with subsequent paralysis. This would require an emergency surgery to decompress, and there are no guarantees that the patient would recover from the paralysis. 4. Discitis-This is an infection of the intervertebral discs.  It occurs in about 1% of discography procedures.  It is difficult to treat and it may lead to surgery.        2. Pain: the needles have to go through skin and soft tissues, will cause soreness.       3. Damage to internal structures:  The nerves to be lesioned may be near blood vessels or    other nerves which can be potentially damaged.       4. Bleeding: Bleeding is more common if the patient is taking blood thinners such as  aspirin, Coumadin, Ticiid, Plavix, etc., or if he/she have some genetic predisposition  such as   hemophilia. Bleeding into the spinal canal can cause compression of the spinal  cord with subsequent paralysis.  This would require an emergency surgery to  decompress and there are no guarantees that the patient would recover from the  paralysis.       5. Pneumothorax:  Puncturing of a lung is a possibility, every time a needle is introduced in  the area of the chest or upper back.  Pneumothorax refers to free air around the  collapsed lung(s), inside of the thoracic cavity (chest cavity).  Another two possible  complications  related to a similar event would include: Hemothorax and Chylothorax.   These are variations of the Pneumothorax, where instead of air around the collapsed  lung(s), you may have blood or chyle, respectively.       6. Spinal headaches: They may occur with any procedures in the area of the spine.       7. Persistent CSF (Cerebro-Spinal Fluid) leakage: This is a rare problem, but may occur  with prolonged intrathecal or epidural catheters either due to the formation of a fistulous  track or a dural tear.       8. Nerve damage: By working so close to the spinal cord, there is always a possibility of  nerve damage, which could be as serious as a permanent spinal cord injury with  paralysis.       9. Death:  Although rare, severe deadly allergic reactions known as "Anaphylactic  reaction" can occur to any of the medications used.      10. Worsening of the symptoms:  We can always make thing worse.  What are the chances of something like this happening? Chances of any of this occuring are extremely low.  By statistics, you have more of a chance of getting killed in a motor vehicle accident: while driving to the hospital than any of the above occurring .  Nevertheless, you should be aware that they are possibilities.  In general, it is similar to taking a shower.  Everybody knows that you can slip, hit your head and get killed.  Does that mean that you should not shower again?  Nevertheless always keep in mind that statistics do not mean anything if you happen to be on the wrong side of them.  Even if a procedure has a 1 (one) in a 1,000,000 (million) chance of going wrong, it you happen to be that one..Also, keep in mind that by statistics, you have more of a chance of having something go wrong when taking medications.  Who should not have this procedure? If you are on a blood thinning medication (e.g. Coumadin, Plavix, see list of "Blood Thinners"), or if you have an active infection going on, you should not  have the procedure.  If you are taking any blood thinners, please inform your physician.  How should I prepare for this procedure?  Do not eat or drink anything at least six hours prior to the procedure.  Bring a driver with you .  It cannot be a taxi.  Come accompanied by an adult that can drive you back, and that is strong enough to help you if your legs get weak or numb from the local anesthetic.  Take all of your medicines the morning of the procedure with just enough water to swallow them.  If you have diabetes, make sure that you are scheduled to have your procedure done first thing in the morning, whenever possible.  If you have diabetes,   take only half of your insulin dose and notify our nurse that you have done so as soon as you arrive at the clinic.  If you are diabetic, but only take blood sugar pills (oral hypoglycemic), then do not take them on the morning of your procedure.  You may take them after you have had the procedure.  Do not take aspirin or any aspirin-containing medications, at least eleven (11) days prior to the procedure.  They may prolong bleeding.  Wear loose fitting clothing that may be easy to take off and that you would not mind if it got stained with Betadine or blood.  Do not wear any jewelry or perfume  Remove any nail coloring.  It will interfere with some of our monitoring equipment.  NOTE: Remember that this is not meant to be interpreted as a complete list of all possible complications.  Unforeseen problems may occur.  BLOOD THINNERS The following drugs contain aspirin or other products, which can cause increased bleeding during surgery and should not be taken for 2 weeks prior to and 1 week after surgery.  If you should need take something for relief of minor pain, you may take acetaminophen which is found in Tylenol,m Datril, Anacin-3 and Panadol. It is not blood thinner. The products listed below are.  Do not take any of the products listed below  in addition to any listed on your instruction sheet.  A.P.C or A.P.C with Codeine Codeine Phosphate Capsules #3 Ibuprofen Ridaura  ABC compound Congesprin Imuran rimadil  Advil Cope Indocin Robaxisal  Alka-Seltzer Effervescent Pain Reliever and Antacid Coricidin or Coricidin-D  Indomethacin Rufen  Alka-Seltzer plus Cold Medicine Cosprin Ketoprofen S-A-C Tablets  Anacin Analgesic Tablets or Capsules Coumadin Korlgesic Salflex  Anacin Extra Strength Analgesic tablets or capsules CP-2 Tablets Lanoril Salicylate  Anaprox Cuprimine Capsules Levenox Salocol  Anexsia-D Dalteparin Magan Salsalate  Anodynos Darvon compound Magnesium Salicylate Sine-off  Ansaid Dasin Capsules Magsal Sodium Salicylate  Anturane Depen Capsules Marnal Soma  APF Arthritis pain formula Dewitt's Pills Measurin Stanback  Argesic Dia-Gesic Meclofenamic Sulfinpyrazone  Arthritis Bayer Timed Release Aspirin Diclofenac Meclomen Sulindac  Arthritis pain formula Anacin Dicumarol Medipren Supac  Analgesic (Safety coated) Arthralgen Diffunasal Mefanamic Suprofen  Arthritis Strength Bufferin Dihydrocodeine Mepro Compound Suprol  Arthropan liquid Dopirydamole Methcarbomol with Aspirin Synalgos  ASA tablets/Enseals Disalcid Micrainin Tagament  Ascriptin Doan's Midol Talwin  Ascriptin A/D Dolene Mobidin Tanderil  Ascriptin Extra Strength Dolobid Moblgesic Ticlid  Ascriptin with Codeine Doloprin or Doloprin with Codeine Momentum Tolectin  Asperbuf Duoprin Mono-gesic Trendar  Aspergum Duradyne Motrin or Motrin IB Triminicin  Aspirin plain, buffered or enteric coated Durasal Myochrisine Trigesic  Aspirin Suppositories Easprin Nalfon Trillsate  Aspirin with Codeine Ecotrin Regular or Extra Strength Naprosyn Uracel  Atromid-S Efficin Naproxen Ursinus  Auranofin Capsules Elmiron Neocylate Vanquish  Axotal Emagrin Norgesic Verin  Azathioprine Empirin or Empirin with Codeine Normiflo Vitamin E  Azolid Emprazil Nuprin Voltaren  Bayer  Aspirin plain, buffered or children's or timed BC Tablets or powders Encaprin Orgaran Warfarin Sodium  Buff-a-Comp Enoxaparin Orudis Zorpin  Buff-a-Comp with Codeine Equegesic Os-Cal-Gesic   Buffaprin Excedrin plain, buffered or Extra Strength Oxalid   Bufferin Arthritis Strength Feldene Oxphenbutazone   Bufferin plain or Extra Strength Feldene Capsules Oxycodone with Aspirin   Bufferin with Codeine Fenoprofen Fenoprofen Pabalate or Pabalate-SF   Buffets II Flogesic Panagesic   Buffinol plain or Extra Strength Florinal or Florinal with Codeine Panwarfarin   Buf-Tabs Flurbiprofen Penicillamine   Butalbital Compound Four-way cold tablets   Penicillin   Butazolidin Fragmin Pepto-Bismol   Carbenicillin Geminisyn Percodan   Carna Arthritis Reliever Geopen Persantine   Carprofen Gold's salt Persistin   Chloramphenicol Goody's Phenylbutazone   Chloromycetin Haltrain Piroxlcam   Clmetidine heparin Plaquenil   Cllnoril Hyco-pap Ponstel   Clofibrate Hydroxy chloroquine Propoxyphen         Before stopping any of these medications, be sure to consult the physician who ordered them.  Some, such as Coumadin (Warfarin) are ordered to prevent or treat serious conditions such as "deep thrombosis", "pumonary embolisms", and other heart problems.  The amount of time that you may need off of the medication may also vary with the medication and the reason for which you were taking it.  If you are taking any of these medications, please make sure you notify your pain physician before you undergo any procedures.         Facet Joint Block, Care After Refer to this sheet in the next few weeks. These instructions provide you with information about caring for yourself after your procedure. Your health care provider may also give you more specific instructions. Your treatment has been planned according to current medical practices, but problems sometimes occur. Call your health care provider if you have any  problems or questions after your procedure. What can I expect after the procedure? After the procedure, it is common to have:  Some tenderness over the injection sites for 2 days after the procedure.  A temporary increase in blood sugar if you have diabetes.  Follow these instructions at home:  Keep track of the amount of pain relief you feel and how long it lasts.  Take over-the-counter and prescription medicines only as told by your health care provider. You may need to limit pain medicine within the first 4-6 hours after the procedure.  Remove your bandages (dressings) the morning after the procedure.  For the first 24 hours after the procedure: ? Do not apply heat near or over the injection sites. ? Do not take a bath or soak in water, such as in a pool or lake. ? Do not drive or operate heavy machinery unless approved by your health care provider. ? Avoid activities that require a lot of energy.  If the injection site is tender, try applying ice to the area. To do this: ? Put ice in a plastic bag. ? Place a towel between your skin and the bag. ? Leave the ice on for 20 minutes, 2-3 times a day.  Keep all follow-up visits as told by your health care provider. This is important. Contact a health care provider if:  Fluid is coming from an injection site.  There is significant bleeding or swelling at an injection site.  You have diabetes and your blood sugar is above 180 mg/dL. Get help right away if:  You have a fever.  You have worsening pain or swelling around an injection site.  There are red streaks around an injection site.  You develop severe pain that is not controlled by your medicines.  You develop a headache, stiff neck, nausea, or vomiting.  Your eyes become very sensitive to light.  You have weakness, paralysis, or tingling in your arms or legs that was not present before the procedure.  You have difficulty urinating or breathing. This information is  not intended to replace advice given to you by your health care provider. Make sure you discuss any questions you have with your health care provider. Document Released: 03/13/2012  Document Revised: 08/11/2015 Document Reviewed: 12/21/2014 Elsevier Interactive Patient Education  2018 [employer]. Facet Joint Block The facet joints connect the bones of the spine (vertebrae). They make it possible for you to bend, twist, and make other movements with your spine. They also keep you from bending too far, twisting too far, and making other excessive movements. A facet joint block is a procedure where a numbing medicine (anesthetic) is injected into a facet joint. Often, a type of anti-inflammatory medicine called a steroid is also injected. A facet joint block may be done to diagnose neck or back pain. If the pain gets better after a facet joint block, it means the pain is probably coming from the facet joint. If the pain does not get better, it means the pain is probably not coming from the facet joint. A facet joint block may also be done to relieve neck or back pain caused by an inflamed facet joint. A facet joint block is only done to relieve pain if the pain does not improve with other methods, such as medicine, exercise programs, and physical therapy. Tell a health care provider about:  Any allergies you have.  All medicines you are taking, including vitamins, herbs, eye drops, creams, and over-the-counter medicines.  Any problems you or family members have had with anesthetic medicines.  Any blood disorders you have.  Any surgeries you have had.  Any medical conditions you have.  Whether you are pregnant or may be pregnant. What are the risks? Generally, this is a safe procedure. However, problems may occur, including:  Bleeding.  Injury to a nerve near the injection site.  Pain at the injection site.  Weakness or numbness in areas controlled by nerves near the injection  site.  Infection.  Temporary fluid retention.  Allergic reactions to medicines or dyes.  Injury to other structures or organs near the injection site.  What happens before the procedure?  Follow instructions from your health care provider about eating or drinking restrictions.  Ask your health care provider about: ? Changing or stopping your regular medicines. This is especially important if you are taking diabetes medicines or blood thinners. ? Taking medicines such as aspirin and ibuprofen. These medicines can thin your blood. Do not take these medicines before your procedure if your health care provider instructs you not to.  Do not take any new dietary supplements or medicines without asking your health care provider first.  Plan to have someone take you home after the procedure. What happens during the procedure?  You may need to remove your clothing and dress in an open-back gown.  The procedure will be done while you are lying on an X-ray table. You will most likely be asked to lie on your stomach, but you may be asked to lie in a different position if an injection will be made in your neck.  Machines will be used to monitor your oxygen levels, heart rate, and blood pressure.  If an injection will be made in your neck, an IV tube will be inserted into one of your veins. Fluids and medicine will flow directly into your body through the IV tube.  The area over the facet joint where the injection will be made will be cleaned with soap. The surrounding skin will be covered with clean drapes.  A numbing medicine (local anesthetic) will be applied to your skin. Your skin may sting or burn for a moment.  A video X-ray machine (fluoroscopy) will be used to  locate the joint. In some cases, a CT scan may be used.  A contrast dye may be injected into the facet joint area to help locate the joint.  When the joint is located, an anesthetic will be injected into the joint through the  needle.  Your health care provider will ask you whether you feel pain relief. If you do feel relief, a steroid may be injected to provide pain relief for a longer period of time. If you do not feel relief or feel only partial relief, additional injections of an anesthetic may be made in other facet joints.  The needle will be removed.  Your skin will be cleaned.  A bandage (dressing) will be applied over each injection site. The procedure may vary among health care providers and hospitals. What happens after the procedure?  You will be observed for 15-30 minutes before being allowed to go home. This information is not intended to replace advice given to you by your health care provider. Make sure you discuss any questions you have with your health care provider. Document Released: 08/16/2006 Document Revised: 04/28/2015 Document Reviewed: 12/21/2014 Elsevier Interactive Patient Education  [employer].

## 2017-01-29 NOTE — Progress Notes (Signed)
Safety precautions to be maintained throughout the outpatient stay will include: orient to surroundings, keep bed in low position, maintain call bell within reach at all times, provide assistance with transfer out of bed and ambulation.  

## 2017-02-12 ENCOUNTER — Encounter: Payer: Self-pay | Admitting: Student in an Organized Health Care Education/Training Program

## 2017-02-12 ENCOUNTER — Ambulatory Visit
Payer: Non-veteran care | Attending: Student in an Organized Health Care Education/Training Program | Admitting: Student in an Organized Health Care Education/Training Program

## 2017-02-12 VITALS — BP 159/85 | HR 100 | Temp 97.7°F | Resp 16 | Ht 68.5 in | Wt 210.0 lb

## 2017-02-12 DIAGNOSIS — M47816 Spondylosis without myelopathy or radiculopathy, lumbar region: Secondary | ICD-10-CM | POA: Diagnosis not present

## 2017-02-12 DIAGNOSIS — G894 Chronic pain syndrome: Secondary | ICD-10-CM | POA: Insufficient documentation

## 2017-02-12 DIAGNOSIS — F329 Major depressive disorder, single episode, unspecified: Secondary | ICD-10-CM | POA: Diagnosis not present

## 2017-02-12 DIAGNOSIS — M542 Cervicalgia: Secondary | ICD-10-CM | POA: Insufficient documentation

## 2017-02-12 DIAGNOSIS — Z7982 Long term (current) use of aspirin: Secondary | ICD-10-CM | POA: Insufficient documentation

## 2017-02-12 DIAGNOSIS — M48061 Spinal stenosis, lumbar region without neurogenic claudication: Secondary | ICD-10-CM | POA: Diagnosis not present

## 2017-02-12 DIAGNOSIS — M545 Low back pain: Secondary | ICD-10-CM | POA: Diagnosis present

## 2017-02-12 DIAGNOSIS — M79605 Pain in left leg: Secondary | ICD-10-CM | POA: Diagnosis present

## 2017-02-12 DIAGNOSIS — M79604 Pain in right leg: Secondary | ICD-10-CM | POA: Diagnosis present

## 2017-02-12 DIAGNOSIS — Z79899 Other long term (current) drug therapy: Secondary | ICD-10-CM | POA: Insufficient documentation

## 2017-02-12 DIAGNOSIS — M25562 Pain in left knee: Secondary | ICD-10-CM | POA: Insufficient documentation

## 2017-02-12 DIAGNOSIS — M25561 Pain in right knee: Secondary | ICD-10-CM | POA: Insufficient documentation

## 2017-02-12 DIAGNOSIS — G629 Polyneuropathy, unspecified: Secondary | ICD-10-CM | POA: Insufficient documentation

## 2017-02-12 DIAGNOSIS — M5030 Other cervical disc degeneration, unspecified cervical region: Secondary | ICD-10-CM | POA: Insufficient documentation

## 2017-02-12 DIAGNOSIS — Z96652 Presence of left artificial knee joint: Secondary | ICD-10-CM | POA: Diagnosis not present

## 2017-02-12 DIAGNOSIS — M5136 Other intervertebral disc degeneration, lumbar region: Secondary | ICD-10-CM | POA: Insufficient documentation

## 2017-02-12 NOTE — Progress Notes (Signed)
Patient's Name: Gary [name]  MRN: [id number]  Referring Provider: Kirk Ruths, MD  DOB: Aug 19, 1950  PCP: Kirk Ruths, MD  DOS: 02/12/2017  Note by: Gillis Santa, MD  Service setting: Ambulatory outpatient  Specialty: Interventional Pain Management  Location: [facility] (AMB) Pain Management Facility    Patient type: Established   Primary Reason(s) for Visit: Encounter for post-procedure evaluation of chronic illness with mild to moderate exacerbation CC: Back Pain (lower) and Leg Pain (bilateral)  HPI  Gary [name] is a 66 y.o. year old, male patient, who comes today for a post-procedure evaluation. He has Spondylosis without myelopathy or radiculopathy, lumbar region; Lumbar degenerative disc disease; Degenerative disc disease, cervical; Chronic pain syndrome; and Facet arthropathy, lumbar on their problem list. His primarily concern today is the Back Pain (lower) and Leg Pain (bilateral)  Pain Assessment: Location: Lower, Left, Right Back Radiating: down the legs bilterally into the calves causing the legs to feel weak and non supporitve  Onset: More than a month ago Duration: Chronic pain Quality: Aching, Constant(weakness) Severity: 8 /10 (self-reported pain score)  Note: Reported level is inconsistent with clinical observations. Clinically the patient looks like a 3/10 A 3/10 is viewed as "Moderate" and described as significantly interfering with activities of daily living (ADL). It becomes difficult to feed, bathe, get dressed, get on and off the toilet or to perform personal hygiene functions. Difficult to get in and out of bed or a chair without assistance. Very distracting. With effort, it can be ignored when deeply involved in activities. Information on the proper use of the pain scale provided to the patient today. When using our objective Pain Scale, levels between 6 and 10/10 are said to belong in an emergency room, as it progressively worsens from a 6/10, described as  severely limiting, requiring emergency care not usually available at an outpatient pain management facility. At a 6/10 level, communication becomes difficult and requires great effort. Assistance to reach the emergency department may be required. Facial flushing and profuse sweating along with potentially dangerous increases in heart rate and blood pressure will be evident. Effect on ADL: gait is being affected by legs being weak.  denies any falls  Timing: Constant Modifying factors: ice  Gary [name] comes in today for post-procedure evaluation after the treatment done on 01/29/2017.  Further details on both, my assessment(s), as well as the proposed treatment plan, please see below.  Post-Procedure Assessment  01/29/2017 Procedure: Bilateral L3, L4, L5 medial branch nerve block Pre-procedure pain score:  8/10 Post-procedure pain score: 0/10         Influential Factors: BMI: 31.47 kg/m Intra-procedural challenges: None observed.         Assessment challenges: None detected.              Reported side-effects: None.        Post-procedural adverse reactions or complications: None reported         Sedation: Please see nurses note. When no sedatives are used, the analgesic levels obtained are directly associated to the effectiveness of the local anesthetics. However, when sedation is provided, the level of analgesia obtained during the initial 1 hour following the intervention, is believed to be the result of a combination of factors. These factors may include, but are not limited to: 1. The effectiveness of the local anesthetics used. 2. The effects of the analgesic(s) and/or anxiolytic(s) used. 3. The degree of discomfort experienced by the patient at the time of the procedure. 4.  The patients ability and reliability in recalling and recording the events. 5. The presence and influence of possible secondary gains and/or psychosocial factors. Reported result: Relief experienced during the 1st  hour after the procedure: 40 % (Ultra-Short Term Relief)            Interpretative annotation: Clinically appropriate result. Analgesia during this period is likely to be Local Anesthetic and/or IV Sedative (Analgesic/Anxiolytic) related.          Effects of local anesthetic: The analgesic effects attained during this period are directly associated to the localized infiltration of local anesthetics and therefore cary significant diagnostic value as to the etiological location, or anatomical origin, of the pain. Expected duration of relief is directly dependent on the pharmacodynamics of the local anesthetic used. Long-acting (4-6 hours) anesthetics used.  Reported result: Relief during the next 4 to 6 hour after the procedure: 40 % (Short-Term Relief)            Interpretative annotation: Clinically appropriate result. Analgesia during this period is likely to be Local Anesthetic-related.          Long-term benefit: Defined as the period of time past the expected duration of local anesthetics (1 hour for short-acting and 4-6 hours for long-acting). With the possible exception of prolonged sympathetic blockade from the local anesthetics, benefits during this period are typically attributed to, or associated with, other factors such as analgesic sensory neuropraxia, antiinflammatory effects, or beneficial biochemical changes provided by agents other than the local anesthetics.  Reported result: Extended relief following procedure: 20 % (Long-Term Relief)            Interpretative annotation: Clinically appropriate result. Good relief. No permanent benefit expected. Limited inflammation. Possible mechanical aggravating factors.          Current benefits: Defined as reported results that persistent at this point in time.   Analgesia: 0 %            Function: Back to baseline ROM: No improvement Interpretative annotation: No benefit. No long-term benefit expected. Results would argue against repeating  therapy.  Patient states that bilateral lumbar facet blocks were not particularly helpful and does not endorse greater than 80% improvement in his axial low back pain for a specified period of time.          Interpretation: Results would suggest failure of therapy in achieving desired goal(s).                  Plan:  Please see "Plan of Care" for details.        Laboratory Chemistry  Inflammation Markers (CRP: Acute Phase) (ESR: Chronic Phase) No results found for: CRP, ESRSEDRATE               Renal Function Markers No results found for: BUN, CREATININE, GFRAA, GFRNONAA               Hepatic Function Markers No results found for: AST, ALT, ALBUMIN, ALKPHOS, HCVAB               Electrolytes No results found for: NA, K, CL, CALCIUM, MG               Neuropathy Markers No results found for: [id number]               Bone Pathology Markers No results found for: Hendricks Milo, VD125OH2TOT, [id number], [id number], 25OHVITD1, 25OHVITD2, 25OHVITD3, CALCIUM, TESTOFREE, TESTOSTERONE  Coagulation Parameters No results found for: INR, LABPROT, APTT, PLT               Cardiovascular Markers No results found for: BNP, HGB, HCT               Note: Lab results reviewed.  Recent Diagnostic Imaging Results  DG C-Arm 1-60 Min-No Report Fluoroscopy was utilized by the requesting physician.  No radiographic  interpretation.   Complexity Note: Imaging results reviewed. Results shared with Mr. Chamberlin, using Layman's terms.                         Meds   Current Outpatient Medications:  .  acetaminophen (TYLENOL) 500 MG tablet, Take as needed by mouth. , Disp: , Rfl:  .  Albuterol Sulfate 108 (90 Base) MCG/ACT AEPB, Inhale as needed into the lungs. , Disp: , Rfl:  .  aspirin 81 MG tablet, Take by mouth., Disp: , Rfl:  .  buPROPion (WELLBUTRIN XL) 300 MG 24 hr tablet, Take 300 mg by mouth daily., Disp: , Rfl:  .  busPIRone (BUSPAR) 15 MG tablet, Take 15 mg by mouth 2 (two) times  daily., Disp: , Rfl:  .  DULoxetine (CYMBALTA) 60 MG capsule, Take by mouth., Disp: , Rfl:  .  esomeprazole (NEXIUM) 20 MG packet, Take 20 mg by mouth daily before breakfast., Disp: , Rfl:  .  fenofibrate (TRICOR) 48 MG tablet, Take 48 mg daily by mouth. , Disp: , Rfl:  .  finasteride (PROSCAR) 5 MG tablet, Take 5 mg daily by mouth. , Disp: , Rfl:  .  gabapentin (NEURONTIN) 300 MG capsule, Take 600 mg at bedtime by mouth. , Disp: , Rfl:  .  HYDROcodone-acetaminophen (NORCO) 10-325 MG tablet, Take 1 tablet by mouth 3 (three) times daily as needed. , Disp: , Rfl:  .  aspirin 325 MG tablet, Take 325 mg by mouth daily., Disp: , Rfl:  .  buPROPion (WELLBUTRIN XL) 300 MG 24 hr tablet, Take by mouth., Disp: , Rfl:  .  busPIRone (BUSPAR) 15 MG tablet, Take by mouth., Disp: , Rfl:  .  cholecalciferol (VITAMIN D) 1000 units tablet, Take by mouth., Disp: , Rfl:  .  DULoxetine (CYMBALTA) 60 MG capsule, , Disp: , Rfl:  .  esomeprazole (NEXIUM) 40 MG capsule, Take by mouth., Disp: , Rfl:  .  gabapentin (NEURONTIN) 300 MG capsule, Take by mouth., Disp: , Rfl:  .  niacin-lovastatin (ADVICOR) 1000-20 MG 24 hr tablet, Take by mouth., Disp: , Rfl:  .  pravastatin (PRAVACHOL) 20 MG tablet, Take by mouth., Disp: , Rfl:   ROS  Constitutional: Denies any fever or chills Gastrointestinal: No reported hemesis, hematochezia, vomiting, or acute GI distress Musculoskeletal: Denies any acute onset joint swelling, redness, loss of ROM, or weakness Neurological: No reported episodes of acute onset apraxia, aphasia, dysarthria, agnosia, amnesia, paralysis, loss of coordination, or loss of consciousness  Allergies  Mr. Penna is allergic to niacin-lovastatin er; atorvastatin; simvastatin; and propoxyphene.  [place]  Drug: Mr. Calvert  reports that he does not use drugs. Alcohol:  reports that he does not drink alcohol. Tobacco:  reports that he quit smoking about 4 years ago. he has never used smokeless tobacco. Medical:   has a past medical history of Depression. Surgical: Mr. Mansfield  has a past surgical history that includes Cholecystectomy; Knee surgery (Left); and deviated septum repair. Family: family history includes Heart disease in his mother.  Constitutional  Exam  General appearance: Well nourished, well developed, and well hydrated. In no apparent acute distress Vitals:   02/12/17 1333  BP: (!) 159/85  Pulse: 100  Resp: 16  Temp: 97.7 F (36.5 C)  TempSrc: Oral  SpO2: 96%  Weight: 210 lb (95.3 kg)  Height: 5' 8.5" (1.74 m)   BMI Assessment: Estimated body mass index is 31.47 kg/m as calculated from the following:   Height as of this encounter: 5' 8.5" (1.74 m).   Weight as of this encounter: 210 lb (95.3 kg).  BMI interpretation table: BMI level Category Range association with higher incidence of chronic pain  <18 kg/m2 Underweight   18.5-24.9 kg/m2 Ideal body weight   25-29.9 kg/m2 Overweight Increased incidence by 20%  30-34.9 kg/m2 Obese (Class I) Increased incidence by 68%  35-39.9 kg/m2 Severe obesity (Class II) Increased incidence by 136%  >40 kg/m2 Extreme obesity (Class III) Increased incidence by 254%   BMI Readings from Last 4 Encounters:  02/12/17 31.47 kg/m  01/29/17 31.93 kg/m  01/22/17 32.39 kg/m   Wt Readings from Last 4 Encounters:  02/12/17 210 lb (95.3 kg)  01/29/17 210 lb (95.3 kg)  01/22/17 213 lb (96.6 kg)  02/05/15 192 lb (87.1 kg)  Psych/Mental status: Alert, oriented x 3 (person, place, & time)       Eyes: PERLA Respiratory: No evidence of acute respiratory distress Cervical Spine Area Exam  Skin & Axial Inspection: No masses, redness, edema, swelling, or associated skin lesions Alignment: Symmetrical Functional ROM: Unrestricted ROM      Stability: No instability detected Muscle Tone/Strength: Functionally intact. No obvious neuro-muscular anomalies detected. Sensory (Neurological): Unimpaired Palpation: No palpable anomalies                      Upper Extremity (UE) Exam    Side: Right upper extremity  Side: Left upper extremity   Skin & Extremity Inspection: Skin color, temperature, and hair growth are WNL. No peripheral edema or cyanosis. No masses, redness, swelling, asymmetry, or associated skin lesions. No contractures.  Skin & Extremity Inspection: Skin color, temperature, and hair growth are WNL. No peripheral edema or cyanosis. No masses, redness, swelling, asymmetry, or associated skin lesions. No contractures.   Functional ROM: Unrestricted ROM          Functional ROM: Unrestricted ROM           Muscle Tone/Strength: Functionally intact. No obvious neuro-muscular anomalies detected.  Muscle Tone/Strength: Functionally intact. No obvious neuro-muscular anomalies detected.   Sensory (Neurological): Unimpaired          Sensory (Neurological): Unimpaired           Palpation: No palpable anomalies              Palpation: No palpable anomalies               Specialized Test(s): Deferred         Specialized Test(s): Deferred           Thoracic Spine Area Exam  Skin & Axial Inspection: No masses, redness, or swelling Alignment: Symmetrical Functional ROM: Unrestricted ROM Stability: No instability detected Muscle Tone/Strength: Functionally intact. No obvious neuro-muscular anomalies detected. Sensory (Neurological): Unimpaired Muscle strength & Tone: No palpable anomalies  Lumbar Spine Area Exam  Skin & Axial Inspection: No masses, redness, or swelling Alignment: Symmetrical Functional ROM: Unrestricted ROM      Stability: No instability detected Muscle Tone/Strength: Functionally intact. No obvious neuro-muscular anomalies detected. Sensory (  Neurological): Unimpaired Palpation: Complains of area being tender to palpation Bilateral Fist Percussion Testoverlying lumbar facets Provocative Tests: Lumbar Hyperextension and rotation test: Positive bilaterally for facet joint pain. Lumbar Lateral bending test:  Positive due to pain. Patrick's Maneuver: evaluation deferred today                    Gait & Posture Assessment  Ambulation: Unassisted Gait: Relatively normal for age and body habitus Posture: WNL   Lower Extremity Exam    Side: Right lower extremity  Side: Left lower extremity  Skin & Extremity Inspection: Skin color, temperature, and hair growth are WNL. No peripheral edema or cyanosis. No masses, redness, swelling, asymmetry, or associated skin lesions. No contractures.  Skin & Extremity Inspection: Skin color, temperature, and hair growth are WNL. No peripheral edema or cyanosis. No masses, redness, swelling, asymmetry, or associated skin lesions. No contractures.  Functional ROM: Unrestricted ROM          Functional ROM: Unrestricted ROM          Muscle Tone/Strength: Functionally intact. No obvious neuro-muscular anomalies detected.  Muscle Tone/Strength: Functionally intact. No obvious neuro-muscular anomalies detected.  Sensory (Neurological): Unimpaired  Sensory (Neurological): Unimpaired  Palpation: No palpable anomalies  Palpation: No palpable anomalies    Assessment  Primary Diagnosis & Pertinent Problem List: The primary encounter diagnosis was Spondylosis without myelopathy or radiculopathy, lumbar region. Diagnoses of Lumbar degenerative disc disease, Degenerative disc disease, cervical, and Chronic pain syndrome were also pertinent to this visit.  Status Diagnosis  Persistent Persistent Persistent 1. Spondylosis without myelopathy or radiculopathy, lumbar region   2. Lumbar degenerative disc disease   3. Degenerative disc disease, cervical   4. Chronic pain syndrome      General Recommendations: The pain condition that the patient suffers from is best treated with a multidisciplinary approach that involves an increase in physical activity to prevent de-conditioning and worsening of the pain cycle, as well as psychological counseling (formal and/or informal)  to address the co-morbid psychological affects of pain. Treatment will often involve judicious use of pain medications and interventional procedures to decrease the pain, allowing the patient to participate in the physical activity that will ultimately produce long-lasting pain reductions. The goal of the multidisciplinary approach is to return the patient to a higher level of overall function and to restore their ability to perform activities of daily living.  66 year old male with chronic axial low back pain that radiates into bilateral buttocks and thighs secondary to severe lumbar degenerative disc disease, lumbar stenosis, lumbar spondylosis. Patient also has neck pain related to cervical degenerative disc disease, bilateral knee pain status post left total knee arthroplasty. Primary pain complaint is axial low back. He states that the medications do help to a certain extent. He has had epidural steroid injections with variable effectiveness.  Patient is status post diagnostic bilateral lumbar facet medial branch nerve blocks on January 29, 2017.  Patient does not endorse any significant improvement in his pain symptoms or his functional status even for a short period of time after the procedure.  This would suggest a negative diagnostic block.  At this point we will not continue with additional diagnostic facet blocks or radiofrequency ablation.  In regards to the patient's chronic lumbar radiculopathy, we can consider repeating epidural steroid injection which the patient has had in the past.  In regards to medication management, [facility] and see if it is okay for him  to transfer his pain management care with the [facility].  Plan: -Follow-up in 3 months or earlier for medication management or procedure.  I can take over opioid therapy at that time. -Can consider repeating lumbar epidural steroid injection when worsening symptoms of  lumbar radiculopathy.  Patient wants to hold off at this time.  Interventional management options: Mr. Fouch was informed that there is no guarantee that he would be a candidate for interventional therapies. The decision will be based on the results of diagnostic studies, as well as Mr. Gorelik risk profile.  Procedure(s) under consideration:  -Lumbar epidural steroid injection -SI joint injection -Cervical medial branch nerve blocks  ------ -lumbar facets NOT effective (01/29/17)     Provider-requested follow-up: Return in about 10 weeks (around 04/23/2017) for Medication Management.  Future Appointments  Date Time Provider [place]  04/24/2017 12:15 PM Gillis Santa, MD [facility] None    Primary Care Physician: Kirk Ruths, MD Location: [facility] Note by: Gillis Santa, M.D Date: 02/12/2017; Time: 3:43 PM  Patient Instructions  Follow up in 10 weeks for medication management or earlier if having worsening pain.

## 2017-02-12 NOTE — Patient Instructions (Signed)
Follow up in 10 weeks for medication management or earlier if having worsening pain.

## 2017-02-12 NOTE — Progress Notes (Signed)
Safety precautions to be maintained throughout the outpatient stay will include: orient to surroundings, keep bed in low position, maintain call bell within reach at all times, provide assistance with transfer out of bed and ambulation.  

## 2017-03-05 ENCOUNTER — Telehealth: Payer: Self-pay

## 2017-03-05 NOTE — Telephone Encounter (Signed)
Dr Holley Raring,  Are you OK with prescribing medications for this patient. If so, please route to secretaries so they can schedule. Thanks!

## 2017-03-12 ENCOUNTER — Encounter: Payer: Self-pay | Admitting: Student in an Organized Health Care Education/Training Program

## 2017-03-12 ENCOUNTER — Other Ambulatory Visit: Payer: Self-pay

## 2017-03-12 ENCOUNTER — Ambulatory Visit
Payer: Non-veteran care | Attending: Student in an Organized Health Care Education/Training Program | Admitting: Student in an Organized Health Care Education/Training Program

## 2017-03-12 VITALS — BP 138/92 | HR 94 | Temp 98.2°F | Resp 18 | Ht 68.0 in | Wt 210.0 lb

## 2017-03-12 DIAGNOSIS — Z7982 Long term (current) use of aspirin: Secondary | ICD-10-CM | POA: Diagnosis not present

## 2017-03-12 DIAGNOSIS — M1288 Other specific arthropathies, not elsewhere classified, other specified site: Secondary | ICD-10-CM | POA: Diagnosis not present

## 2017-03-12 DIAGNOSIS — Z888 Allergy status to other drugs, medicaments and biological substances status: Secondary | ICD-10-CM | POA: Insufficient documentation

## 2017-03-12 DIAGNOSIS — Z9049 Acquired absence of other specified parts of digestive tract: Secondary | ICD-10-CM | POA: Insufficient documentation

## 2017-03-12 DIAGNOSIS — G894 Chronic pain syndrome: Secondary | ICD-10-CM | POA: Diagnosis not present

## 2017-03-12 DIAGNOSIS — Z79899 Other long term (current) drug therapy: Secondary | ICD-10-CM | POA: Insufficient documentation

## 2017-03-12 DIAGNOSIS — Z79891 Long term (current) use of opiate analgesic: Secondary | ICD-10-CM | POA: Insufficient documentation

## 2017-03-12 DIAGNOSIS — Z8249 Family history of ischemic heart disease and other diseases of the circulatory system: Secondary | ICD-10-CM | POA: Insufficient documentation

## 2017-03-12 DIAGNOSIS — M5136 Other intervertebral disc degeneration, lumbar region: Secondary | ICD-10-CM | POA: Insufficient documentation

## 2017-03-12 DIAGNOSIS — Z87891 Personal history of nicotine dependence: Secondary | ICD-10-CM | POA: Diagnosis not present

## 2017-03-12 DIAGNOSIS — M545 Low back pain: Secondary | ICD-10-CM | POA: Diagnosis present

## 2017-03-12 DIAGNOSIS — M47816 Spondylosis without myelopathy or radiculopathy, lumbar region: Secondary | ICD-10-CM | POA: Insufficient documentation

## 2017-03-12 DIAGNOSIS — F329 Major depressive disorder, single episode, unspecified: Secondary | ICD-10-CM | POA: Diagnosis not present

## 2017-03-12 MED ORDER — OXYCODONE-ACETAMINOPHEN 10-325 MG PO TABS: 1.0000 | ORAL_TABLET | Freq: Three times a day (TID) | ORAL | 0 refills | Status: DC | PRN

## 2017-03-12 MED ORDER — GABAPENTIN 300 MG PO CAPS: 600.0000 mg | ORAL_CAPSULE | Freq: Every day | ORAL | 4 refills | Status: DC

## 2017-03-12 MED ORDER — DULOXETINE HCL 60 MG PO CPEP: 60.0000 mg | ORAL_CAPSULE | Freq: Every day | ORAL | 4 refills | Status: DC

## 2017-03-12 NOTE — Patient Instructions (Signed)
1. Prescription for Gabapentin, Cymbalta 2. Opioid contract 3. Percocet prescription 10 mg up to three times a day as needed, Rx for 2 months

## 2017-03-12 NOTE — Progress Notes (Signed)
Safety precautions to be maintained throughout the outpatient stay will include: orient to surroundings, keep bed in low position, maintain call bell within reach at all times, provide assistance with transfer out of bed and ambulation.  

## 2017-03-12 NOTE — Progress Notes (Signed)
Patient's Name: Gary [name]  MRN: [id number]  Referring Provider: Kirk Ruths, MD  DOB: 09-18-1950  PCP: Kirk Ruths, MD  DOS: 03/12/2017  Note by: Gillis Santa, MD  Service setting: Ambulatory outpatient  Specialty: Interventional Pain Management  Location: [facility] (AMB) Pain Management Facility    Patient type: Established   Primary Reason(s) for Visit: Encounter for prescription drug management. (Level of risk: moderate)  CC: Back Pain (lower)  HPI  Gary [name] is a 66 y.o. year old, male patient, who comes today for a medication management evaluation. He has Spondylosis without myelopathy or radiculopathy, lumbar region; Lumbar degenerative disc disease; Degenerative disc disease, cervical; Chronic pain syndrome; and Facet arthropathy, lumbar on their problem list. His primarily concern today is the Back Pain (lower)  Pain Assessment: Location: Lower Back Radiating: backs of both upper legs Onset:   Duration: Chronic pain Quality: (aggravating) Severity: 8 /10 (self-reported pain score)  Note: Reported level is inconsistent with clinical observations. Clinically the patient looks like a 3/10 A 3/10 is viewed as "Moderate" and described as significantly interfering with activities of daily living (ADL). It becomes difficult to feed, bathe, get dressed, get on and off the toilet or to perform personal hygiene functions. Difficult to get in and out of bed or a chair without assistance. Very distracting. With effort, it can be ignored when deeply involved in activities.       When using our objective Pain Scale, levels between 6 and 10/10 are said to belong in an emergency room, as it progressively worsens from a 6/10, described as severely limiting, requiring emergency care not usually available at an outpatient pain management facility. At a 6/10 level, communication becomes difficult and requires great effort. Assistance to reach the emergency department may be required.  Facial flushing and profuse sweating along with potentially dangerous increases in heart rate and blood pressure will be evident. Effect on ADL:   Timing: Constant Modifying factors: nothing  Gary [name] was last scheduled for an appointment on 02/12/2017 for medication management. During today's appointment we reviewed [name] chronic pain status, as well as his outpatient medication regimen.  Patient returns today for follow-up.  Is hoping to have his medication management transferred from the [place] to our pain clinic.  Patient status post bilateral lumbar medial branch blocks on January 29, 2017 which were not effective even short-term for his axial low back pain that radiates into his buttocks.  Otherwise he describes his pain at baseline.  States that hydrocodone is not as effective and is utilizing an extra tablet a day, sometimes 4 tablets daily.  The patient  reports that he does not use drugs. His body mass index is 31.93 kg/m.  Further details on both, my assessment(s), as well as the proposed treatment plan, please see below.  Controlled Substance Pharmacotherapy Assessment REMS (Risk Evaluation and Mitigation Strategy)  Analgesic: Hydrocodone 10 mg 3 times daily as needed, quantity 90 MME/day: Approximately 30 mg/day.  [name], RN  03/12/2017 12:23 PM  Sign at close encounter Safety precautions to be maintained throughout the outpatient stay will include: orient to surroundings, keep bed in low position, maintain call bell within reach at all times, provide assistance with transfer out of bed and ambulation.    Pharmacokinetics: Liberation and absorption (onset of action): WNL Distribution (time to peak effect): WNL Metabolism and excretion (duration of action): WNL         Pharmacodynamics: Desired effects: Analgesia: [name] reports 50%  benefit. Functional ability: Patient reports that medication does help, but not nearly as much as he would like Clinically  meaningful improvement in function (CMIF): Sustained CMIF goals met Perceived effectiveness: Described as ineffective and would like to make some changes Undesirable effects: Side-effects or Adverse reactions: None reported Monitoring: [place] PMP: Online review of the past 79-monthperiod conducted. Compliant with practice rules and regulations Last UDS on record: Summary  Date Value Ref Range Status  01/22/2017 FINAL  Final    Comment:    ==================================================================== TOXAS[facility]RE COMP DRUG ANALYSIS,UR ==================================================================== Test                             Result       Flag       Units Drug Present and Declared for Prescription Verification   Gabapentin                     PRESENT      EXPECTED Drug Present not Declared for Prescription Verification   Hydrocodone                    62           UNEXPECTED ng/mg creat   Hydromorphone                  49           UNEXPECTED ng/mg creat    Sources of hydrocodone include scheduled prescription    medications. Hydromorphone is an expected metabolite of    hydrocodone. Hydromorphone is also available as a scheduled    prescription medication.   Acetaminophen                  PRESENT      UNEXPECTED Drug Absent but Declared for Prescription Verification   Bupropion                      Not Detected UNEXPECTED   Salicylate                     Not Detected UNEXPECTED    Aspirin, as indicated in the declared medication list, is not    always detected even when used as directed. ==================================================================== Test                      Result    Flag   Units      Ref Range   Creatinine              145              mg/dL      >=20 ==================================================================== Declared Medications:  The flagging and interpretation on this report are based on the  following declared medications.   Unexpected results may arise from  inaccuracies in the declared medications.  **Note: The testing scope of this panel includes these medications:  Bupropion (Wellbutrin)  Gabapentin (Neurontin)  **Note: The testing scope of this panel does not include small to  moderate amounts of these reported medications:  Aspirin  **Note: The testing scope of this panel does not include following  reported medications:  Buspirone (BuSpar)  Omeprazole (Nexium) ==================================================================== For clinical consultation, please call [phone]====    UDS interpretation: Compliant          Medication Assessment Form: Reviewed. Patient indicates being compliant with therapy Treatment  compliance: Compliant Risk Assessment Profile: Aberrant behavior: See prior evaluations. None observed or detected today Comorbid factors increasing risk of overdose: See prior notes. No additional risks detected today Risk of substance use disorder (SUD): Moderate Opioid Risk Tool - 01/22/17 1034      Family History of Substance Abuse   Alcohol  Positive Male    Illegal Drugs  Negative    Rx Drugs  Negative      Personal History of Substance Abuse   Alcohol  Negative    Illegal Drugs  Negative    Rx Drugs  Negative      Age   Age between 75-45 years   No      History of Preadolescent Sexual Abuse   History of Preadolescent Sexual Abuse  Negative or Male      Psychological Disease   Psychological Disease  Negative    Depression  Positive      Total Score   Opioid Risk Tool Scoring  4    Opioid Risk Interpretation  Moderate Risk      ORT Scoring interpretation table:  Score <3 = Low Risk for SUD  Score between 4-7 = Moderate Risk for SUD  Score >8 = High Risk for Opioid Abuse   Risk Mitigation Strategies:  Patient Counseling: Completed today. Counseling provided to patient as per "Patient  Counseling Document". Document signed by patient, attesting to counseling and understanding Patient-Prescriber Agreement (PPA): Obtained today  Notification to other healthcare providers: Done  Pharmacologic Plan: Hydrocodone to oxycodone change, 10 mg which is a dose escalation.  We will continue at this dose.  Notified patient no further dose increases.   Meds   Current Outpatient Medications:  .  acetaminophen (TYLENOL) 500 MG tablet, Take as needed by mouth. , Disp: , Rfl:  .  Albuterol Sulfate 108 (90 Base) MCG/ACT AEPB, Inhale as needed into the lungs. , Disp: , Rfl:  .  aspirin 325 MG tablet, Take 325 mg by mouth daily., Disp: , Rfl:  .  buPROPion (WELLBUTRIN XL) 300 MG 24 hr tablet, Take 300 mg by mouth daily., Disp: , Rfl:  .  busPIRone (BUSPAR) 15 MG tablet, Take 15 mg by mouth 2 (two) times daily., Disp: , Rfl:  .  DULoxetine (CYMBALTA) 60 MG capsule, Take 1 capsule (60 mg total) by mouth daily., Disp: 30 capsule, Rfl: 4 .  esomeprazole (NEXIUM) 20 MG packet, Take 20 mg by mouth daily before breakfast., Disp: , Rfl:  .  fenofibrate (TRICOR) 48 MG tablet, Take 48 mg daily by mouth. , Disp: , Rfl:  .  finasteride (PROSCAR) 5 MG tablet, Take 5 mg daily by mouth. , Disp: , Rfl:  .  gabapentin (NEURONTIN) 300 MG capsule, Take 2 capsules (600 mg total) by mouth at bedtime., Disp: 60 capsule, Rfl: 4 .  aspirin 81 MG tablet, Take by mouth., Disp: , Rfl:  .  buPROPion (WELLBUTRIN XL) 300 MG 24 hr tablet, Take by mouth., Disp: , Rfl:  .  busPIRone (BUSPAR) 15 MG tablet, Take by mouth., Disp: , Rfl:  .  cholecalciferol (VITAMIN D) 1000 units tablet, Take by mouth., Disp: , Rfl:  .  esomeprazole (NEXIUM) 40 MG capsule, Take by mouth., Disp: , Rfl:  .  niacin-lovastatin (ADVICOR) 1000-20 MG 24 hr tablet, Take by mouth., Disp: , Rfl:  .  oxyCODONE-acetaminophen (PERCOCET) 10-325 MG tablet, Take 1 tablet by mouth every 8 (eight) hours as needed for pain. For chronic pain, Disp: 90 tablet, Rfl:  0 .  pravastatin (PRAVACHOL) 20 MG tablet, Take by mouth., Disp: , Rfl:   ROS  Constitutional: Denies any fever or chills Gastrointestinal: No reported hemesis, hematochezia, vomiting, or acute GI distress Musculoskeletal: Denies any acute onset joint swelling, redness, loss of ROM, or weakness Neurological: No reported episodes of acute onset apraxia, aphasia, dysarthria, agnosia, amnesia, paralysis, loss of coordination, or loss of consciousness  Allergies  Mr. Daniello is allergic to niacin-lovastatin er; atorvastatin; simvastatin; and propoxyphene.  [place]  Drug: Mr. Peddie  reports that he does not use drugs. Alcohol:  reports that he does not drink alcohol. Tobacco:  reports that he quit smoking about 4 years ago. he has never used smokeless tobacco. Medical:  has a past medical history of Depression. Surgical: Mr. Tidwell  has a past surgical history that includes Cholecystectomy; Knee surgery (Left); and deviated septum repair. Family: family history includes Heart disease in his mother.  Constitutional Exam  General appearance: Well nourished, well developed, and well hydrated. In no apparent acute distress Vitals:   03/12/17 1221  BP: (!) 138/92  Pulse: 94  Resp: 18  Temp: 98.2 F (36.8 C)  TempSrc: Oral  SpO2: 96%  Weight: 210 lb (95.3 kg)  Height: '5\' 8"'  (1.727 m)   BMI Assessment: Estimated body mass index is 31.93 kg/m as calculated from the following:   Height as of this encounter: '5\' 8"'  (1.727 m).   Weight as of this encounter: 210 lb (95.3 kg).  BMI interpretation table: BMI level Category Range association with higher incidence of chronic pain  <18 kg/m2 Underweight   18.5-24.9 kg/m2 Ideal body weight   25-29.9 kg/m2 Overweight Increased incidence by 20%  30-34.9 kg/m2 Obese (Class I) Increased incidence by 68%  35-39.9 kg/m2 Severe obesity (Class II) Increased incidence by 136%  >40 kg/m2 Extreme obesity (Class III) Increased incidence by 254%   BMI  Readings from Last 4 Encounters:  03/12/17 31.93 kg/m  02/12/17 31.47 kg/m  01/29/17 31.93 kg/m  01/22/17 32.39 kg/m   Wt Readings from Last 4 Encounters:  03/12/17 210 lb (95.3 kg)  02/12/17 210 lb (95.3 kg)  01/29/17 210 lb (95.3 kg)  01/22/17 213 lb (96.6 kg)  Psych/Mental status: Alert, oriented x 3 (person, place, & time)       Eyes: PERLA Respiratory: No evidence of acute respiratory distress  Cervical Spine Area Exam  Skin & Axial Inspection: No masses, redness, edema, swelling, or associated skin lesions Alignment: Symmetrical Functional ROM: Unrestricted ROM      Stability: No instability detected Muscle Tone/Strength: Functionally intact. No obvious neuro-muscular anomalies detected. Sensory (Neurological): Unimpaired Palpation: No palpable anomalies              Upper Extremity (UE) Exam    Side: Right upper extremity  Side: Left upper extremity  Skin & Extremity Inspection: Skin color, temperature, and hair growth are WNL. No peripheral edema or cyanosis. No masses, redness, swelling, asymmetry, or associated skin lesions. No contractures.  Skin & Extremity Inspection: Skin color, temperature, and hair growth are WNL. No peripheral edema or cyanosis. No masses, redness, swelling, asymmetry, or associated skin lesions. No contractures.  Functional ROM: Unrestricted ROM          Functional ROM: Unrestricted ROM          Muscle Tone/Strength: Functionally intact. No obvious neuro-muscular anomalies detected.  Muscle Tone/Strength: Functionally intact. No obvious neuro-muscular anomalies detected.  Sensory (Neurological): Unimpaired          Sensory (  Neurological): Unimpaired          Palpation: No palpable anomalies              Palpation: No palpable anomalies              Specialized Test(s): Deferred         Specialized Test(s): Deferred          Thoracic Spine Area Exam  Skin & Axial Inspection: No masses, redness, or swelling Alignment: Symmetrical Functional  ROM: Unrestricted ROM Stability: No instability detected Muscle Tone/Strength: Functionally intact. No obvious neuro-muscular anomalies detected. Sensory (Neurological): Unimpaired Muscle strength & Tone: No palpable anomalies  Lumbar Spine Area Exam  Skin & Axial Inspection: No masses, redness, or swelling Alignment: Symmetrical Functional ROM: Unrestricted ROM      Stability: No instability detected Muscle Tone/Strength: Functionally intact. No obvious neuro-muscular anomalies detected. Sensory (Neurological): Unimpaired Palpation: No palpable anomalies       Provocative Tests: Lumbar Hyperextension and rotation test: Positive bilaterally for facet joint pain. Lumbar Lateral bending test: Positive due to pain. Patrick's Maneuver: evaluation deferred today                    Gait & Posture Assessment  Ambulation: Unassisted Gait: Relatively normal for age and body habitus Posture: WNL   Lower Extremity Exam    Side: Right lower extremity  Side: Left lower extremity  Skin & Extremity Inspection: Skin color, temperature, and hair growth are WNL. No peripheral edema or cyanosis. No masses, redness, swelling, asymmetry, or associated skin lesions. No contractures.  Skin & Extremity Inspection: Skin color, temperature, and hair growth are WNL. No peripheral edema or cyanosis. No masses, redness, swelling, asymmetry, or associated skin lesions. No contractures.  Functional ROM: Unrestricted ROM          Functional ROM: Unrestricted ROM          Muscle Tone/Strength: Functionally intact. No obvious neuro-muscular anomalies detected.  Muscle Tone/Strength: Functionally intact. No obvious neuro-muscular anomalies detected.  Sensory (Neurological): Unimpaired  Sensory (Neurological): Unimpaired  Palpation: No palpable anomalies  Palpation: No palpable anomalies   Assessment  Primary Diagnosis & Pertinent Problem List: The primary encounter diagnosis was Spondylosis without myelopathy or  radiculopathy, lumbar region. Diagnoses of Lumbar degenerative disc disease, Chronic pain syndrome, and Facet arthropathy, lumbar were also pertinent to this visit.  Status Diagnosis  Persistent Persistent Persistent 1. Spondylosis without myelopathy or radiculopathy, lumbar region   2. Lumbar degenerative disc disease   3. Chronic pain syndrome   4. Facet arthropathy, lumbar       66 year old male with axial low back pain that  radiates to bilateral lower extremities secondary to lumbar degenerative disc disease, lumbar facet arthropathy, lumbar spondylosis status post bilateral lumbar medial branch blocks which were not effective for his low back pain.  Patient has been receiving chronic pain management at the [facility] and is hoping to transfer his care here.  This is reasonable.  I will have the patient complete an opiate agreement and we will change his hydrocodone to oxycodone 10 mg 3 times daily.  I explained to the patient that going from hydrocodone 10 mg to oxycodone 10 mg is actually a dose escalation since hydrocodone is approximately 1.5 times stronger than oxycodone.  This should help with his worsening pain.  I also had a discussion about opioid-induced hyperalgesia and notify the patient that we would not be increasing his dose beyond this and that if he was  continuing to have ineffective pain relief from this dose that we could try opioid rotation or opioid holiday.  Plan: -Sign opiate agreement -Oxycodone 10 mg 3 times daily as needed, quantity 61-month  Prescription provided for 2 months -Refill for gabapentin 600 mg nightly -Refill for Cymbalta 60 mg nightly -Follow-up in 2 months    Plan of Care  Pharmacotherapy (Medications Ordered): Meds ordered this encounter  Medications  . gabapentin (NEURONTIN) 300 MG capsule    Sig: Take 2 capsules (600 mg total) by mouth at bedtime.    Dispense:  60 capsule    Refill:  4  . DULoxetine (CYMBALTA) 60 MG capsule    Sig: Take 1  capsule (60 mg total) by mouth daily.    Dispense:  30 capsule    Refill:  4  . DISCONTD: oxyCODONE-acetaminophen (PERCOCET) 10-325 MG tablet    Sig: Take 1 tablet by mouth every 8 (eight) hours as needed for pain.    Dispense:  90 tablet    Refill:  0    Do not place this medication, or any other prescription from our practice, on "Automatic Refill". Patient may have prescription filled one day early if pharmacy is closed on scheduled refill date.  To fill on or after: 03/12/17, 04/11/16  . DISCONTD: oxyCODONE-acetaminophen (PERCOCET) 10-325 MG tablet    Sig: Take 1 tablet by mouth every 8 (eight) hours as needed for pain.    Dispense:  90 tablet    Refill:  0    Do not place this medication, or any other prescription from our practice, on "Automatic Refill". Patient may have prescription filled one day early if pharmacy is closed on scheduled refill date.  To fill on or after: 03/12/17, 04/11/16  . DISCONTD: oxyCODONE-acetaminophen (PERCOCET) 10-325 MG tablet    Sig: Take 1 tablet by mouth every 8 (eight) hours as needed for pain. For chronic pain    Dispense:  90 tablet    Refill:  0    Do not place this medication, or any other prescription from our practice, on "Automatic Refill". Patient may have prescription filled one day early if pharmacy is closed on scheduled refill date.  To fill on or after: 03/12/17, 04/11/17  . oxyCODONE-acetaminophen (PERCOCET) 10-325 MG tablet    Sig: Take 1 tablet by mouth every 8 (eight) hours as needed for pain. For chronic pain    Dispense:  90 tablet    Refill:  0    Do not place this medication, or any other prescription from our practice, on "Automatic Refill". Patient may have prescription filled one day early if pharmacy is closed on scheduled refill date.  To fill on or after: 03/12/17, 04/11/17   Lab-work, procedure(s), and/or referral(s): No orders of the defined types were placed in this encounter.   Provider-requested follow-up: Return in about  8 weeks (around 05/07/2017) for Medication Management.  Future Appointments  Date Time Provider [place] 04/24/2017 12:15 PM [facility]illis Santa [facility]    Primary Care Physician: AKirk Ruths MD [facility] Note by: BGillis Santa M.D Date: 03/12/2017; Time: 3:59 PM  Patient Instructions  1. Prescription for Gabapentin, Cymbalta 2. Opioid contract 3. Percocet prescription 10 mg up to three times a day as needed, Rx for 2 months

## 2017-04-10 DIAGNOSIS — C61 Malignant neoplasm of prostate: Secondary | ICD-10-CM

## 2017-04-10 HISTORY — DX: Malignant neoplasm of prostate: C61

## 2017-04-24 ENCOUNTER — Other Ambulatory Visit: Payer: Self-pay

## 2017-04-24 ENCOUNTER — Ambulatory Visit
Payer: PPO | Attending: Student in an Organized Health Care Education/Training Program | Admitting: Student in an Organized Health Care Education/Training Program

## 2017-04-24 ENCOUNTER — Encounter: Payer: Self-pay | Admitting: Student in an Organized Health Care Education/Training Program

## 2017-04-24 VITALS — BP 140/93 | HR 95 | Temp 98.0°F | Resp 16 | Ht 68.0 in | Wt 210.0 lb

## 2017-04-24 DIAGNOSIS — M79652 Pain in left thigh: Secondary | ICD-10-CM | POA: Diagnosis not present

## 2017-04-24 DIAGNOSIS — Z87891 Personal history of nicotine dependence: Secondary | ICD-10-CM | POA: Insufficient documentation

## 2017-04-24 DIAGNOSIS — Z7982 Long term (current) use of aspirin: Secondary | ICD-10-CM | POA: Insufficient documentation

## 2017-04-24 DIAGNOSIS — Z79891 Long term (current) use of opiate analgesic: Secondary | ICD-10-CM | POA: Insufficient documentation

## 2017-04-24 DIAGNOSIS — M79651 Pain in right thigh: Secondary | ICD-10-CM | POA: Diagnosis not present

## 2017-04-24 DIAGNOSIS — M5136 Other intervertebral disc degeneration, lumbar region: Secondary | ICD-10-CM | POA: Diagnosis not present

## 2017-04-24 DIAGNOSIS — F329 Major depressive disorder, single episode, unspecified: Secondary | ICD-10-CM | POA: Diagnosis not present

## 2017-04-24 DIAGNOSIS — M47816 Spondylosis without myelopathy or radiculopathy, lumbar region: Secondary | ICD-10-CM | POA: Diagnosis not present

## 2017-04-24 DIAGNOSIS — M5030 Other cervical disc degeneration, unspecified cervical region: Secondary | ICD-10-CM | POA: Diagnosis not present

## 2017-04-24 DIAGNOSIS — G894 Chronic pain syndrome: Secondary | ICD-10-CM | POA: Diagnosis not present

## 2017-04-24 DIAGNOSIS — Z5181 Encounter for therapeutic drug level monitoring: Secondary | ICD-10-CM | POA: Insufficient documentation

## 2017-04-24 MED ORDER — OXYCODONE-ACETAMINOPHEN 10-325 MG PO TABS: 1.0000 | ORAL_TABLET | Freq: Three times a day (TID) | ORAL | 0 refills | Status: DC | PRN

## 2017-04-24 NOTE — Progress Notes (Signed)
Nursing Pain Medication Assessment:  Safety precautions to be maintained throughout the outpatient stay will include: orient to surroundings, keep bed in low position, maintain call bell within reach at all times, provide assistance with transfer out of bed and ambulation.  Medication Inspection Compliance: Pill count conducted under aseptic conditions, in front of the patient. Neither the pills nor the bottle was removed from the patient's sight at any time. Once count was completed pills were immediately returned to the patient in their original bottle.  Medication: Oxycodone/APAP Pill/Patch Count: 50 of 90 pills remain Pill/Patch Appearance: Markings consistent with prescribed medication Bottle Appearance: Standard pharmacy container. Clearly labeled. Filled Date: 01 / 02 / 2019 Last Medication intake:  Today

## 2017-04-24 NOTE — Progress Notes (Signed)
Patient's Name: Gary [name]  MRN: [id number]  Referring Provider: Kirk Ruths, MD  DOB: 07-18-1950  PCP: Kirk Ruths, MD  DOS: 04/24/2017  Note by: Gillis Santa, MD  Service setting: Ambulatory outpatient  Specialty: Interventional Pain Management  Location: [facility] (AMB) Pain Management Facility    Patient type: Established   Primary Reason(s) for Visit: Encounter for prescription drug management. (Level of risk: moderate)  CC: Back Pain (lower back) and Leg Pain (top of thighs bilaterally)  HPI  Gary [name] is a 67 y.o. year old, male patient, who comes today for a medication management evaluation. He has Spondylosis without myelopathy or radiculopathy, lumbar region; Lumbar degenerative disc disease; Degenerative disc disease, cervical; Chronic pain syndrome; and Facet arthropathy, lumbar on their problem list. His primarily concern today is the Back Pain (lower back) and Leg Pain (top of thighs bilaterally)  Pain Assessment: Location: Lower Back Radiating: to hips and buttocks bilaterally, to tops and back sides of both thighs Onset: More than a month ago Duration: Chronic pain Quality: Aching, Constant Severity: 7 /10 (self-reported pain score)  Note: Reported level is inconsistent with clinical observations. Clinically the patient looks like a 3/10 A 3/10 is viewed as "Moderate" and described as significantly interfering with activities of daily living (ADL). It becomes difficult to feed, bathe, get dressed, get on and off the toilet or to perform personal hygiene functions. Difficult to get in and out of bed or a chair without assistance. Very distracting. With effort, it can be ignored when deeply involved in activities.       When using our objective Pain Scale, levels between 6 and 10/10 are said to belong in an emergency room, as it progressively worsens from a 6/10, described as severely limiting, requiring emergency care not usually available at an outpatient pain  management facility. At a 6/10 level, communication becomes difficult and requires great effort. Assistance to reach the emergency department may be required. Facial flushing and profuse sweating along with potentially dangerous increases in heart rate and blood pressure will be evident. Effect on ADL: unable to mow lawn at one time, difficult to get up from chair/recliner Timing: Constant Modifying factors: medications, ice  Gary [name] was last scheduled for an appointment on 03/12/2017 for medication management. During today's appointment we reviewed Gary [name] chronic pain status, as well as his outpatient medication regimen.  The patient  reports that he does not use drugs. His body mass index is 31.93 kg/m.  Further details on both, my assessment(s), as well as the proposed treatment plan, please see below.  Controlled Substance Pharmacotherapy Assessment REMS (Risk Evaluation and Mitigation Strategy)  Analgesic: Oxycodone 10 mg 3 times daily as needed, quantity 90 MME/day: Approximately 45 mg/day.  Rise Patience  04/24/2017 12:58 PM  Signed Nursing Pain Medication Assessment:  Safety precautions to be maintained throughout the outpatient stay will include: orient to surroundings, keep bed in low position, maintain call bell within reach at all times, provide assistance with transfer out of bed and ambulation.  Medication Inspection Compliance: Pill count conducted under aseptic conditions, in front of the patient. Neither the pills nor the bottle was removed from the patient's sight at any time. Once count was completed pills were immediately returned to the patient in their original bottle.  Medication: Oxycodone/APAP Pill/Patch Count: 50 of 90 pills remain Pill/Patch Appearance: Markings consistent with prescribed medication Bottle Appearance: Standard pharmacy container. Clearly labeled. Filled Date: 01 / 02 / 2019 Last Medication intake:  Today   Pharmacokinetics: Liberation  and absorption (onset of action): WNL Distribution (time to peak effect): WNL Metabolism and excretion (duration of action): WNL         Pharmacodynamics: Desired effects: Analgesia: Gary [name] reports >50% benefit. Functional ability: Patient reports that medication allows him to accomplish basic ADLs Clinically meaningful improvement in function (CMIF): Sustained CMIF goals met Perceived effectiveness: Described as relatively effective, allowing for increase in activities of daily living (ADL) Undesirable effects: Side-effects or Adverse reactions: None reported Monitoring: [place] PMP: Online review of the past 11-monthperiod conducted. Unreported sources of opioid analgesics detected patient did have hydrocodone prescription, quantity 90 filled [place] [facility] who has managed his opioid therapy in the past.  I instructed the patient that this was against our clinic policy and that with future refills of opioid medications for chronic pain management will be grounds to avoid his opioid contract.  Patient endorsed understanding.  States that he will not obtain any additional prescriptions from his [facility] [place]. Last UDS on record: Summary  Date Value Ref Range Status  01/22/2017 FINAL  Final    Comment:    ==================================================================== [facility]RE COMP DRUG ANALYSIS,UR ==================================================================== Test                             Result       Flag       Units Drug Present and Declared for Prescription Verification   Gabapentin                     PRESENT      EXPECTED Drug Present not Declared for Prescription Verification   Hydrocodone                    62           UNEXPECTED ng/mg creat   Hydromorphone                  49           UNEXPECTED ng/mg creat    Sources of hydrocodone include scheduled prescription    medications. Hydromorphone is an expected metabolite of    hydrocodone.  Hydromorphone is also available as a scheduled    prescription medication.   Acetaminophen                  PRESENT      UNEXPECTED Drug Absent but Declared for Prescription Verification   Bupropion                      Not Detected UNEXPECTED   Salicylate                     Not Detected UNEXPECTED    Aspirin, as indicated in the declared medication list, is not    always detected even when used as directed. ==================================================================== Test                      Result    Flag   Units      Ref Range   Creatinine              145              mg/dL      >=20 ==================================================================== Declared Medications:  The flagging and interpretation on this report are based on the  following declared medications.  Unexpected results may arise from  inaccuracies in the declared medications.  **Note: The testing scope of this panel includes these medications:  Bupropion (Wellbutrin)  Gabapentin (Neurontin)  **Note: The testing scope of this panel does not include small to  moderate amounts of these reported medications:  Aspirin  **Note: The testing scope of this panel does not include following  reported medications:  Buspirone (BuSpar)  Omeprazole (Nexium) ==================================================================== For clinical consultation, please call [phone]. ====================================================================    UDS interpretation: Compliant          Medication Assessment Form: Reviewed. Patient indicates being compliant with therapy Treatment compliance: Compliant Risk Assessment Profile: Aberrant behavior: See prior evaluations. None observed or detected today Comorbid factors increasing risk of overdose: See prior notes. No additional risks detected today Risk of substance use disorder (SUD): Low Opioid Risk Tool - 04/24/17 1249      Family History of Substance Abuse    Alcohol  Negative    Illegal Drugs  Negative    Rx Drugs  Negative      Personal History of Substance Abuse   Alcohol  Negative    Illegal Drugs  Negative    Rx Drugs  Negative      Age   Age between 28-45 years   No      History of Preadolescent Sexual Abuse   History of Preadolescent Sexual Abuse  Negative or Male      Psychological Disease   Psychological Disease  Negative    Depression  Positive      Total Score   Opioid Risk Tool Scoring  1    Opioid Risk Interpretation  Low Risk      ORT Scoring interpretation table:  Score <3 = Low Risk for SUD  Score between 4-7 = Moderate Risk for SUD  Score >8 = High Risk for Opioid Abuse   Risk Mitigation Strategies:  Patient Counseling: Covered Patient-Prescriber Agreement (PPA): Present and active  Notification to other healthcare providers: Done  Pharmacologic Plan: No change in therapy, at this time.             Laboratory Chemistry  Inflammation Markers (CRP: Acute Phase) (ESR: Chronic Phase) No results found for: CRP, ESRSEDRATE, LATICACIDVEN               Rheumatology Markers No results found for: RF, ANA, LABURIC, URICUR, LYMEIGGIGMAB, LYMEABIGMQN              Renal Function Markers No results found for: BUN, CREATININE, GFRAA, GFRNONAA               Hepatic Function Markers No results found for: AST, ALT, ALBUMIN, ALKPHOS, HCVAB, AMYLASE, LIPASE, AMMONIA               Electrolytes No results found for: NA, K, CL, CALCIUM, MG, PHOS               Neuropathy Markers No results found for: VITAMINB12, FOLATE, HGBA1C, HIV               Bone Pathology Markers No results found for: VD25OH, [id number], TD3220UR4, [id number], 25OHVITD1, 25OHVITD2, 25OHVITD3, TESTOFREE, TESTOSTERONE               Coagulation Parameters No results found for: INR, LABPROT, APTT, PLT, DDIMER               Cardiovascular Markers No results found for: BNP, CKTOTAL, CKMB, TROPONINI, HGB, HCT  CA Markers No results  found for: CEA, CA125, LABCA2               Note: Lab results reviewed.  Recent Diagnostic Imaging Results  DG C-Arm 1-60 Min-No Report Fluoroscopy was utilized by the requesting physician.  No radiographic  interpretation.   Complexity Note: Imaging results reviewed. Results shared with Gary [name], using Layman's terms.                         Meds   Current Outpatient Medications:  .  acetaminophen (TYLENOL) 500 MG tablet, Take as needed by mouth. , Disp: , Rfl:  .  Albuterol Sulfate 108 (90 Base) MCG/ACT AEPB, Inhale as needed into the lungs. , Disp: , Rfl:  .  aspirin 81 MG tablet, Take by mouth., Disp: , Rfl:  .  buPROPion (WELLBUTRIN XL) 300 MG 24 hr tablet, Take 300 mg by mouth daily., Disp: , Rfl:  .  busPIRone (BUSPAR) 15 MG tablet, Take 15 mg by mouth 2 (two) times daily., Disp: , Rfl:  .  DULoxetine (CYMBALTA) 60 MG capsule, Take 1 capsule (60 mg total) by mouth daily., Disp: 30 capsule, Rfl: 4 .  esomeprazole (NEXIUM) 40 MG capsule, Take by mouth., Disp: , Rfl:  .  fenofibrate (TRICOR) 48 MG tablet, Take 48 mg daily by mouth. , Disp: , Rfl:  .  finasteride (PROSCAR) 5 MG tablet, Take 5 mg daily by mouth. , Disp: , Rfl:  .  gabapentin (NEURONTIN) 300 MG capsule, Take 2 capsules (600 mg total) by mouth at bedtime., Disp: 60 capsule, Rfl: 4 .  oxyCODONE-acetaminophen (PERCOCET) 10-325 MG tablet, Take 1 tablet by mouth every 8 (eight) hours as needed for pain. For chronic pain, Disp: 90 tablet, Rfl: 0 .  aspirin 325 MG tablet, Take 325 mg by mouth daily., Disp: , Rfl:  .  buPROPion (WELLBUTRIN XL) 300 MG 24 hr tablet, Take by mouth., Disp: , Rfl:  .  busPIRone (BUSPAR) 15 MG tablet, Take by mouth., Disp: , Rfl:  .  cholecalciferol (VITAMIN D) 1000 units tablet, Take by mouth., Disp: , Rfl:  .  esomeprazole (NEXIUM) 20 MG packet, Take 20 mg by mouth daily before breakfast., Disp: , Rfl:  .  niacin-lovastatin (ADVICOR) 1000-20 MG 24 hr tablet, Take by mouth., Disp: , Rfl:  .   pravastatin (PRAVACHOL) 20 MG tablet, Take by mouth., Disp: , Rfl:   ROS  Constitutional: Denies any fever or chills Gastrointestinal: No reported hemesis, hematochezia, vomiting, or acute GI distress Musculoskeletal: Denies any acute onset joint swelling, redness, loss of ROM, or weakness Neurological: No reported episodes of acute onset apraxia, aphasia, dysarthria, agnosia, amnesia, paralysis, loss of coordination, or loss of consciousness  Allergies  Gary [name] is allergic to niacin-lovastatin er; atorvastatin; simvastatin; and propoxyphene.  [place]  Drug: Gary [name]  reports that he does not use drugs. Alcohol:  reports that he does not drink alcohol. Tobacco:  reports that he quit smoking about 5 years ago. he has never used smokeless tobacco. Medical:  has a past medical history of Depression. Surgical: Gary [name]  has a past surgical history that includes Cholecystectomy; Knee surgery (Left); and deviated septum repair. Family: family history includes Heart disease in his mother.  Constitutional Exam  General appearance: Well nourished, well developed, and well hydrated. In no apparent acute distress Vitals:   04/24/17 1233  BP: (!) 140/93  Pulse: 95  Resp: 16  Temp: 98 F (  36.7 C)  TempSrc: Oral  SpO2: 95%  Weight: 210 lb (95.3 kg)  Height: '5\' 8"'  (1.727 m)   BMI Assessment: Estimated body mass index is 31.93 kg/m as calculated from the following:   Height as of this encounter: '5\' 8"'  (1.727 m).   Weight as of this encounter: 210 lb (95.3 kg).  BMI interpretation table: BMI level Category Range association with higher incidence of chronic pain  <18 kg/m2 Underweight   18.5-24.9 kg/m2 Ideal body weight   25-29.9 kg/m2 Overweight Increased incidence by 20%  30-34.9 kg/m2 Obese (Class I) Increased incidence by 68%  35-39.9 kg/m2 Severe obesity (Class II) Increased incidence by 136%  >40 kg/m2 Extreme obesity (Class III) Increased incidence by 254%   BMI Readings  from Last 4 Encounters:  04/24/17 31.93 kg/m  03/12/17 31.93 kg/m  02/12/17 31.47 kg/m  01/29/17 31.93 kg/m   Wt Readings from Last 4 Encounters:  04/24/17 210 lb (95.3 kg)  03/12/17 210 lb (95.3 kg)  02/12/17 210 lb (95.3 kg)  01/29/17 210 lb (95.3 kg)  Psych/Mental status: Alert, oriented x 3 (person, place, & time)       Eyes: PERLA Respiratory: No evidence of acute respiratory distress  Cervical Spine Area Exam  Skin & Axial Inspection: No masses, redness, edema, swelling, or associated skin lesions Alignment: Symmetrical Functional ROM: Unrestricted ROM      Stability: No instability detected Muscle Tone/Strength: Functionally intact. No obvious neuro-muscular anomalies detected. Sensory (Neurological): Unimpaired Palpation: No palpable anomalies              Upper Extremity (UE) Exam    Side: Right upper extremity  Side: Left upper extremity  Skin & Extremity Inspection: Skin color, temperature, and hair growth are WNL. No peripheral edema or cyanosis. No masses, redness, swelling, asymmetry, or associated skin lesions. No contractures.  Skin & Extremity Inspection: Skin color, temperature, and hair growth are WNL. No peripheral edema or cyanosis. No masses, redness, swelling, asymmetry, or associated skin lesions. No contractures.  Functional ROM: Unrestricted ROM          Functional ROM: Unrestricted ROM          Muscle Tone/Strength: Functionally intact. No obvious neuro-muscular anomalies detected.  Muscle Tone/Strength: Functionally intact. No obvious neuro-muscular anomalies detected.  Sensory (Neurological): Unimpaired          Sensory (Neurological): Unimpaired          Palpation: No palpable anomalies              Palpation: No palpable anomalies              Specialized Test(s): Deferred         Specialized Test(s): Deferred          Thoracic Spine Area Exam  Skin & Axial Inspection: No masses, redness, or swelling Alignment: Symmetrical Functional ROM:  Unrestricted ROM Stability: No instability detected Muscle Tone/Strength: Functionally intact. No obvious neuro-muscular anomalies detected. Sensory (Neurological): Unimpaired Muscle strength & Tone: No palpable anomalies   Lumbar Spine Area Exam  Skin & Axial Inspection: No masses, redness, or swelling Alignment: Symmetrical Functional ROM: Unrestricted ROM      Stability: No instability detected Muscle Tone/Strength: Functionally intact. No obvious neuro-muscular anomalies detected. Sensory (Neurological): Unimpaired Palpation: No palpable anomalies       Provocative Tests: Lumbar Hyperextension and rotation test: Positive bilaterally for facet joint pain. Lumbar Lateral bending test: Positive due to pain. Patrick's Maneuver: evaluation deferred today  Gait & Posture Assessment  Ambulation: Unassisted Gait: Relatively normal for age and body habitus Posture: WNL   Lower Extremity Exam    Side: Right lower extremity  Side: Left lower extremity  Skin & Extremity Inspection: Skin color, temperature, and hair growth are WNL. No peripheral edema or cyanosis. No masses, redness, swelling, asymmetry, or associated skin lesions. No contractures.  Skin & Extremity Inspection: Skin color, temperature, and hair growth are WNL. No peripheral edema or cyanosis. No masses, redness, swelling, asymmetry, or associated skin lesions. No contractures.  Functional ROM: Unrestricted ROM          Functional ROM: Unrestricted ROM          Muscle Tone/Strength: Functionally intact. No obvious neuro-muscular anomalies detected.  Muscle Tone/Strength: Functionally intact. No obvious neuro-muscular anomalies detected.  Sensory (Neurological): Unimpaired  Sensory (Neurological): Unimpaired  Palpation: No palpable anomalies  Palpation: No palpable anomalies     Assessment  Primary Diagnosis & Pertinent Problem List: The primary encounter diagnosis was Spondylosis without  myelopathy or radiculopathy, lumbar region. Diagnoses of Lumbar degenerative disc disease, Chronic pain syndrome, Facet arthropathy, lumbar, and Degenerative disc disease, cervical were also pertinent to this visit.  Status Diagnosis  Controlled Controlled Controlled 1. Spondylosis without myelopathy or radiculopathy, lumbar region   2. Lumbar degenerative disc disease   3. Chronic pain syndrome   4. Facet arthropathy, lumbar   5. Degenerative disc disease, cervical      67 year old male with axial low back pain that  radiates to bilateral lower extremities secondary to lumbar degenerative disc disease, lumbar facet arthropathy, lumbar spondylosis status post bilateral lumbar medial branch blocks which were not effective for his low back pain.  Patient presents today for follow-up and medication refill.  I had a discussion with the patient about his prescription for hydrocodone on December 6, quantity 90 from his [place] provider Dr. Henrene Pastor who had previously managed his opioid medications (confirmed [place] PMP).  I had an extensive discussion with the patient about our clinic policies and that it is unacceptable for him to fill opioid medications for chronic pain from another provider.  Patient was apologetic and endorsed understanding.  I instructed him that if this were to happen again in the future, his opioid contract with our clinic will be voided.   Otherwise we will refill the patient's medications as below, Percocet 10 mg 3 times daily as needed for 2 months.  Patient will continue his gabapentin 600 mg nightly, Cymbalta 600 mg nightly.  Patient will follow-up in 2 months for medication management.  Plan: -Oxycodone 10 mg 3 times daily as needed, quantity 20-month  Prescription provided for 2 months -Continue gabapentin 600 mg nightly -Continue Cymbalta 60 mg nightly -Follow-up in 2 months  Plan of Care  Pharmacotherapy (Medications Ordered): Meds ordered this encounter   Medications  . DISCONTD: oxyCODONE-acetaminophen (PERCOCET) 10-325 MG tablet    Sig: Take 1 tablet by mouth every 8 (eight) hours as needed for pain. For chronic pain    Dispense:  90 tablet    Refill:  0    Do not place this medication, or any other prescription from our practice, on "Automatic Refill". Patient may have prescription filled one day early if pharmacy is closed on scheduled refill date.  To fill on or after: 05/11/17, 06/08/17  . oxyCODONE-acetaminophen (PERCOCET) 10-325 MG tablet    Sig: Take 1 tablet by mouth every 8 (eight) hours as needed for pain. For chronic pain  Dispense:  90 tablet    Refill:  0    Do not place this medication, or any other prescription from our practice, on "Automatic Refill". Patient may have prescription filled one day early if pharmacy is closed on scheduled refill date.  To fill on or after: 05/11/17, 06/08/17    Time Note: Greater than 50% of the 25 minute(s) of face-to-face time spent with Gary [name], was spent in counseling/coordination of care regarding: the appropriate use of the pain scale, opioid tolerance, "Drug Holidays", the treatment plan, medication side effects, the appropriate use of his medications, realistic expectations, the medication agreement and the patient's responsibilities when it comes to controlled substances. Provider-requested follow-up: Return in about 8 weeks (around 06/19/2017) for Medication Management.  Future Appointments  Date Time Provider [place]  06/19/2017 11:30 AM Gillis Santa, MD [facility] None    Primary Care Physician: Kirk Ruths, MD Location: [facility] Note by: Gillis Santa, M.D Date: 04/24/2017; Time: 2:30 PM  There are no Patient Instructions on file for this visit.

## 2017-04-30 DIAGNOSIS — I10 Essential (primary) hypertension: Secondary | ICD-10-CM | POA: Diagnosis not present

## 2017-04-30 DIAGNOSIS — I2510 Atherosclerotic heart disease of native coronary artery without angina pectoris: Secondary | ICD-10-CM | POA: Diagnosis not present

## 2017-04-30 DIAGNOSIS — R0689 Other abnormalities of breathing: Secondary | ICD-10-CM | POA: Diagnosis not present

## 2017-04-30 DIAGNOSIS — I872 Venous insufficiency (chronic) (peripheral): Secondary | ICD-10-CM | POA: Diagnosis not present

## 2017-04-30 DIAGNOSIS — R0600 Dyspnea, unspecified: Secondary | ICD-10-CM | POA: Diagnosis not present

## 2017-04-30 DIAGNOSIS — G4733 Obstructive sleep apnea (adult) (pediatric): Secondary | ICD-10-CM | POA: Diagnosis not present

## 2017-04-30 DIAGNOSIS — E782 Mixed hyperlipidemia: Secondary | ICD-10-CM | POA: Diagnosis not present

## 2017-05-09 DIAGNOSIS — E782 Mixed hyperlipidemia: Secondary | ICD-10-CM | POA: Diagnosis not present

## 2017-05-10 DIAGNOSIS — R0689 Other abnormalities of breathing: Secondary | ICD-10-CM | POA: Diagnosis not present

## 2017-05-10 DIAGNOSIS — R0600 Dyspnea, unspecified: Secondary | ICD-10-CM | POA: Diagnosis not present

## 2017-05-14 DIAGNOSIS — F325 Major depressive disorder, single episode, in full remission: Secondary | ICD-10-CM | POA: Diagnosis not present

## 2017-05-14 DIAGNOSIS — R739 Hyperglycemia, unspecified: Secondary | ICD-10-CM | POA: Diagnosis not present

## 2017-05-14 DIAGNOSIS — E782 Mixed hyperlipidemia: Secondary | ICD-10-CM | POA: Diagnosis not present

## 2017-05-14 DIAGNOSIS — I10 Essential (primary) hypertension: Secondary | ICD-10-CM | POA: Diagnosis not present

## 2017-05-14 DIAGNOSIS — G4733 Obstructive sleep apnea (adult) (pediatric): Secondary | ICD-10-CM | POA: Diagnosis not present

## 2017-05-14 DIAGNOSIS — I2510 Atherosclerotic heart disease of native coronary artery without angina pectoris: Secondary | ICD-10-CM | POA: Diagnosis not present

## 2017-05-14 DIAGNOSIS — R6889 Other general symptoms and signs: Secondary | ICD-10-CM | POA: Diagnosis not present

## 2017-05-17 DIAGNOSIS — I25118 Atherosclerotic heart disease of native coronary artery with other forms of angina pectoris: Secondary | ICD-10-CM | POA: Diagnosis not present

## 2017-05-17 DIAGNOSIS — I10 Essential (primary) hypertension: Secondary | ICD-10-CM | POA: Diagnosis not present

## 2017-05-17 DIAGNOSIS — G4733 Obstructive sleep apnea (adult) (pediatric): Secondary | ICD-10-CM | POA: Diagnosis not present

## 2017-05-17 DIAGNOSIS — E782 Mixed hyperlipidemia: Secondary | ICD-10-CM | POA: Diagnosis not present

## 2017-06-19 ENCOUNTER — Other Ambulatory Visit: Payer: Self-pay

## 2017-06-19 ENCOUNTER — Ambulatory Visit
Payer: No Typology Code available for payment source | Attending: Student in an Organized Health Care Education/Training Program | Admitting: Student in an Organized Health Care Education/Training Program

## 2017-06-19 ENCOUNTER — Encounter: Payer: Self-pay | Admitting: Student in an Organized Health Care Education/Training Program

## 2017-06-19 VITALS — BP 131/92 | HR 95 | Temp 97.7°F | Resp 18 | Ht 68.0 in | Wt 210.0 lb

## 2017-06-19 DIAGNOSIS — M47816 Spondylosis without myelopathy or radiculopathy, lumbar region: Secondary | ICD-10-CM | POA: Insufficient documentation

## 2017-06-19 DIAGNOSIS — Z87891 Personal history of nicotine dependence: Secondary | ICD-10-CM | POA: Insufficient documentation

## 2017-06-19 DIAGNOSIS — Z5181 Encounter for therapeutic drug level monitoring: Secondary | ICD-10-CM | POA: Insufficient documentation

## 2017-06-19 DIAGNOSIS — G894 Chronic pain syndrome: Secondary | ICD-10-CM | POA: Diagnosis not present

## 2017-06-19 DIAGNOSIS — Z79899 Other long term (current) drug therapy: Secondary | ICD-10-CM | POA: Diagnosis not present

## 2017-06-19 DIAGNOSIS — M5030 Other cervical disc degeneration, unspecified cervical region: Secondary | ICD-10-CM

## 2017-06-19 DIAGNOSIS — M5136 Other intervertebral disc degeneration, lumbar region: Secondary | ICD-10-CM

## 2017-06-19 DIAGNOSIS — F329 Major depressive disorder, single episode, unspecified: Secondary | ICD-10-CM | POA: Insufficient documentation

## 2017-06-19 DIAGNOSIS — Z7982 Long term (current) use of aspirin: Secondary | ICD-10-CM | POA: Diagnosis not present

## 2017-06-19 MED ORDER — OXYCODONE-ACETAMINOPHEN 10-325 MG PO TABS: 1.0000 | ORAL_TABLET | Freq: Three times a day (TID) | ORAL | 0 refills | Status: DC | PRN

## 2017-06-19 NOTE — Patient Instructions (Signed)
You have been given 2 scripts for Percocet today.

## 2017-06-19 NOTE — Progress Notes (Signed)
Patient's Name: Gary [name]  MRN: [id number]  Referring Provider: Kirk Ruths, MD  DOB: Sep 19, 1950  PCP: Kirk Ruths, MD  DOS: 06/19/2017  Note by: Gillis Santa, MD  Service setting: Ambulatory outpatient  Specialty: Interventional Pain Management  Location: [facility] (AMB) Pain Management Facility    Patient type: Established   Primary Reason(s) for Visit: Encounter for prescription drug management. (Level of risk: moderate)  CC: Back Pain (low)  HPI  Gary [name] is a 67 y.o. year old, male patient, who comes today for a medication management evaluation. He has Spondylosis without myelopathy or radiculopathy, lumbar region; Lumbar degenerative disc disease; Degenerative disc disease, cervical; Chronic pain syndrome; and Facet arthropathy, lumbar on their problem list. His primarily concern today is the Back Pain (low)  Pain Assessment: Location: Lower Back Radiating: radiates to buttocks, hips and down legs to top of thigh Onset: More than a month ago Duration: Chronic pain Quality: Aching, Constant Severity: 8 /10 (self-reported pain score)  Note: Reported level is inconsistent with clinical observations. Clinically the patient looks like a 2/10 A 2/10 is viewed as "Mild to Moderate" and described as noticeable and distracting. Impossible to hide from other people. More frequent flare-ups. Still possible to adapt and function close to normal. It can be very annoying and may have occasional stronger flare-ups. With discipline, patients may get used to it and adapt.       When using our objective Pain Scale, levels between 6 and 10/10 are said to belong in an emergency room, as it progressively worsens from a 6/10, described as severely limiting, requiring emergency care not usually available at an outpatient pain management facility. At a 6/10 level, communication becomes difficult and requires great effort. Assistance to reach the emergency department may be required. Facial  flushing and profuse sweating along with potentially dangerous increases in heart rate and blood pressure will be evident. Effect on ADL:  limits Timing: Constant Modifying factors: meds, ice  Gary [name] was last scheduled for an appointment on 04/24/2017 for medication management. During today's appointment we reviewed Gary [name] chronic pain status, as well as his outpatient medication regimen.  The patient  reports that he does not use drugs. His body mass index is 31.93 kg/m.  Further details on both, my assessment(s), as well as the proposed treatment plan, please see below.  Controlled Substance Pharmacotherapy Assessment REMS (Risk Evaluation and Mitigation Strategy)  Analgesic:Oxycodone 10 mg 3 times daily as needed, quantity 90 MME/day:Approximately 11m/day.  TDewayne Shorter RN  06/19/2017 11:39 AM  Signed Nursing Pain Medication Assessment:  Safety precautions to be maintained throughout the outpatient stay will include: orient to surroundings, keep bed in low position, maintain call bell within reach at all times, provide assistance with transfer out of bed and ambulation.  Medication Inspection Compliance: Pill count conducted under aseptic conditions, in front of the patient. Neither the pills nor the bottle was removed from the patient's sight at any time. Once count was completed pills were immediately returned to the patient in their original bottle.  Medication: Oxycodone/APAP Pill/Patch Count: 57 of 90 pills remain Pill/Patch Appearance: Markings consistent with prescribed medication Bottle Appearance: Standard pharmacy container. Clearly labeled. Filled Date: 03 / 01/ 2019 Last Medication intake:  Today   Pharmacokinetics: Liberation and absorption (onset of action): WNL Distribution (time to peak effect): WNL Metabolism and excretion (duration of action): WNL         Pharmacodynamics: Desired effects: Analgesia: Mr. RAmsdenreports >50% benefit.  Functional  ability: Patient reports that medication allows him to accomplish basic ADLs Clinically meaningful improvement in function (CMIF): Sustained CMIF goals met Perceived effectiveness: Described as relatively effective, allowing for increase in activities of daily living (ADL) Undesirable effects: Side-effects or Adverse reactions: None reported Monitoring: [place] P[place]: Online review of the past 62-monthperiod conducted. Compliant with practice rules and regulations Last UDS on record: Summary  Date Value Ref Range Status  01/22/2017 FINAL  Final    Comment:    ==================================================================== [facility] COMP DRUG ANALYSIS,UR ==================================================================== Test                             Result       Flag       Units Drug Present and Declared for Prescription Verification   Gabapentin                     PRESENT      EXPECTED Drug Present not Declared for Prescription Verification   Hydrocodone                    62           UNEXPECTED ng/mg creat   Hydromorphone                  49           UNEXPECTED ng/mg creat    Sources of hydrocodone include scheduled prescription    medications. Hydromorphone is an expected metabolite of    hydrocodone. Hydromorphone is also available as a scheduled    prescription medication.   Acetaminophen                  PRESENT      UNEXPECTED Drug Absent but Declared for Prescription Verification   Bupropion                      Not Detected UNEXPECTED   Salicylate                     Not Detected UNEXPECTED    Aspirin, as indicated in the declared medication list, is not    always detected even when used as directed. ==================================================================== Test                      Result    Flag   Units      Ref Range   Creatinine              145              mg/dL      >=20 ==================================================================== Declared  Medications:  The flagging and interpretation on this report are based on the  following declared medications.  Unexpected results may arise from  inaccuracies in the declared medications.  **Note: The testing scope of this panel includes these medications:  Bupropion (Wellbutrin)  Gabapentin (Neurontin)  **Note: The testing scope of this panel does not include small to  moderate amounts of these reported medications:  Aspirin  **Note: The testing scope of this panel does not include following  reported medications:  Buspirone (BuSpar)  Omeprazole (Nexium) ==================================================================== For clinical consultation, please call [phone] ====================================================================    UDS interpretation: Compliant          Medication Assessment Form: Reviewed. Patient indicates being compliant with therapy Treatment compliance: Compliant Risk  Assessment Profile: Aberrant behavior: See prior evaluations. None observed or detected today Comorbid factors increasing risk of overdose: See prior notes. No additional risks detected today Risk of substance use disorder (SUD): Low Opioid Risk Tool - 04/24/17 1249      Family History of Substance Abuse   Alcohol  Negative    Illegal Drugs  Negative    Rx Drugs  Negative      Personal History of Substance Abuse   Alcohol  Negative    Illegal Drugs  Negative    Rx Drugs  Negative      Age   Age between 28-45 years   No      History of Preadolescent Sexual Abuse   History of Preadolescent Sexual Abuse  Negative or Male      Psychological Disease   Psychological Disease  Negative    Depression  Positive      Total Score   Opioid Risk Tool Scoring  1    Opioid Risk Interpretation  Low Risk      ORT Scoring interpretation table:  Score <3 = Low Risk for SUD  Score between 4-7 = Moderate Risk for SUD  Score >8 = High Risk for Opioid Abuse   Risk Mitigation  Strategies:  Patient Counseling: Covered Patient-Prescriber Agreement (PPA): Present and active  Notification to other healthcare providers: Done  Pharmacologic Plan: No change in therapy, at this time.             Laboratory Chemistry  Inflammation Markers (CRP: Acute Phase) (ESR: Chronic Phase) No results found for: CRP, ESRSEDRATE, LATICACIDVEN                       Rheumatology Markers No results found for: RF, ANA, LABURIC, URICUR, LYMEIGGIGMAB, LYMEABIGMQN              Renal Function Markers No results found for: BUN, CREATININE, GFRAA, GFRNONAA               Hepatic Function Markers No results found for: AST, ALT, ALBUMIN, ALKPHOS, HCVAB, AMYLASE, LIPASE, AMMONIA               Electrolytes No results found for: NA, K, CL, CALCIUM, MG, PHOS                      Neuropathy Markers No results found for: VITAMINB12, FOLATE, HGBA1C, HIV               Bone Pathology Markers No results found for: VD25OH, DC301TH4HOO, IL5797KQ2, SU0156FB3, 25OHVITD1, 25OHVITD2, 25OHVITD3, TESTOFREE, TESTOSTERONE                       Coagulation Parameters No results found for: INR, LABPROT, APTT, PLT, DDIMER               Cardiovascular Markers No results found for: BNP, CKTOTAL, CKMB, TROPONINI, HGB, HCT               CA Markers No results found for: CEA, CA125, LABCA2               Note: Lab results reviewed.  Recent Diagnostic Imaging Results  DG C-Arm 1-60 Min-No Report Fluoroscopy was utilized by the requesting physician.  No radiographic  interpretation.   Complexity Note: Imaging results reviewed. Results shared with Mr. Kriz, using Layman's terms.  Meds   Current Outpatient Medications:  .  acetaminophen (TYLENOL) 500 MG tablet, Take as needed by mouth. , Disp: , Rfl:  .  Albuterol Sulfate 108 (90 Base) MCG/ACT AEPB, Inhale as needed into the lungs. , Disp: , Rfl:  .  aspirin 81 MG tablet, Take by mouth., Disp: , Rfl:  .  buPROPion (WELLBUTRIN  XL) 300 MG 24 hr tablet, Take 300 mg by mouth daily., Disp: , Rfl:  .  busPIRone (BUSPAR) 15 MG tablet, Take 15 mg by mouth 2 (two) times daily., Disp: , Rfl:  .  cholecalciferol (VITAMIN D) 1000 units tablet, Take by mouth., Disp: , Rfl:  .  DULoxetine (CYMBALTA) 60 MG capsule, Take 1 capsule (60 mg total) by mouth daily., Disp: 30 capsule, Rfl: 4 .  esomeprazole (NEXIUM) 20 MG packet, Take 20 mg by mouth daily before breakfast., Disp: , Rfl:  .  fenofibrate (TRICOR) 48 MG tablet, Take 48 mg daily by mouth. , Disp: , Rfl:  .  finasteride (PROSCAR) 5 MG tablet, Take 5 mg daily by mouth. , Disp: , Rfl:  .  gabapentin (NEURONTIN) 300 MG capsule, Take 2 capsules (600 mg total) by mouth at bedtime., Disp: 60 capsule, Rfl: 4 .  niacin-lovastatin (ADVICOR) 1000-20 MG 24 hr tablet, Take by mouth., Disp: , Rfl:  .  oxyCODONE-acetaminophen (PERCOCET) 10-325 MG tablet, Take 1 tablet by mouth every 8 (eight) hours as needed for pain. For chronic pain, Disp: 90 tablet, Rfl: 0 .  pravastatin (PRAVACHOL) 20 MG tablet, Take by mouth., Disp: , Rfl:  .  aspirin 325 MG tablet, Take 325 mg by mouth daily., Disp: , Rfl:  .  buPROPion (WELLBUTRIN XL) 300 MG 24 hr tablet, Take by mouth., Disp: , Rfl:  .  busPIRone (BUSPAR) 15 MG tablet, Take by mouth., Disp: , Rfl:  .  esomeprazole (NEXIUM) 40 MG capsule, Take by mouth., Disp: , Rfl:   ROS  Constitutional: Denies any fever or chills Gastrointestinal: No reported hemesis, hematochezia, vomiting, or acute GI distress Musculoskeletal: Denies any acute onset joint swelling, redness, loss of ROM, or weakness Neurological: No reported episodes of acute onset apraxia, aphasia, dysarthria, agnosia, amnesia, paralysis, loss of coordination, or loss of consciousness  Allergies  Mr. Pelcher is allergic to niacin-lovastatin er; atorvastatin; simvastatin; and propoxyphene.  [place]  Drug: Mr. Lange  reports that he does not use drugs. Alcohol:  reports that he does not drink  alcohol. Tobacco:  reports that he quit smoking about 5 years ago. he has never used smokeless tobacco. Medical:  has a past medical history of Depression. Surgical: Mr. Mcnairy  has a past surgical history that includes Cholecystectomy; Knee surgery (Left); and deviated septum repair. Family: family history includes Heart disease in his mother.  Constitutional Exam  General appearance: Well nourished, well developed, and well hydrated. In no apparent acute distress Vitals:   06/19/17 1131 06/19/17 1133  BP:  (!) 131/92  Pulse: 95   Resp: 18   Temp: 97.7 F (36.5 C)   SpO2: 99%   Weight: 210 lb (95.3 kg)   Height: '5\' 8"'  (1.727 m)    BMI Assessment: Estimated body mass index is 31.93 kg/m as calculated from the following:   Height as of this encounter: '5\' 8"'  (1.727 m).   Weight as of this encounter: 210 lb (95.3 kg).  BMI interpretation table: BMI level Category Range association with higher incidence of chronic pain  <18 kg/m2 Underweight   18.5-24.9 kg/m2 Ideal body  weight   25-29.9 kg/m2 Overweight Increased incidence by 20%  30-34.9 kg/m2 Obese (Class I) Increased incidence by 68%  35-39.9 kg/m2 Severe obesity (Class II) Increased incidence by 136%  >40 kg/m2 Extreme obesity (Class III) Increased incidence by 254%   BMI Readings from Last 4 Encounters:  06/19/17 31.93 kg/m  04/24/17 31.93 kg/m  03/12/17 31.93 kg/m  02/12/17 31.47 kg/m   Wt Readings from Last 4 Encounters:  06/19/17 210 lb (95.3 kg)  04/24/17 210 lb (95.3 kg)  03/12/17 210 lb (95.3 kg)  02/12/17 210 lb (95.3 kg)  Psych/Mental status: Alert, oriented x 3 (person, place, & time)       Eyes: PERLA Respiratory: No evidence of acute respiratory distress  Cervical Spine Area Exam  Skin & Axial Inspection: No masses, redness, edema, swelling, or associated skin lesions Alignment: Symmetrical Functional ROM: Unrestricted ROM      Stability: No instability detected Muscle Tone/Strength: Functionally  intact. No obvious neuro-muscular anomalies detected. Sensory (Neurological): Unimpaired Palpation: No palpable anomalies              Upper Extremity (UE) Exam    Side: Right upper extremity  Side: Left upper extremity  Skin & Extremity Inspection: Skin color, temperature, and hair growth are WNL. No peripheral edema or cyanosis. No masses, redness, swelling, asymmetry, or associated skin lesions. No contractures.  Skin & Extremity Inspection: Skin color, temperature, and hair growth are WNL. No peripheral edema or cyanosis. No masses, redness, swelling, asymmetry, or associated skin lesions. No contractures.  Functional ROM: Unrestricted ROM          Functional ROM: Unrestricted ROM          Muscle Tone/Strength: Functionally intact. No obvious neuro-muscular anomalies detected.  Muscle Tone/Strength: Functionally intact. No obvious neuro-muscular anomalies detected.  Sensory (Neurological): Unimpaired          Sensory (Neurological): Unimpaired          Palpation: No palpable anomalies              Palpation: No palpable anomalies              Specialized Test(s): Deferred         Specialized Test(s): Deferred          Thoracic Spine Area Exam  Skin & Axial Inspection: No masses, redness, or swelling Alignment: Symmetrical Functional ROM: Unrestricted ROM Stability: No instability detected Muscle Tone/Strength: Functionally intact. No obvious neuro-muscular anomalies detected. Sensory (Neurological): Unimpaired Muscle strength & Tone: No palpable anomalies  Lumbar Spine Area Exam  Skin & Axial Inspection: No masses, redness, or swelling Alignment: Symmetrical Functional ROM: Unrestricted ROM      Stability: No instability detected Muscle Tone/Strength: Functionally intact. No obvious neuro-muscular anomalies detected. Sensory (Neurological): Unimpaired Palpation: No palpable anomalies       Provocative Tests: Lumbar Hyperextension and rotation test: Positive bilaterally for facet  joint pain. Lumbar Lateral bending test: Positive ipsilateral radicular pain, bilaterally. Positive for bilateral foraminal stenosis. Patrick's Maneuver: Positive for bilateral S-I arthralgia              Gait & Posture Assessment  Ambulation: Unassisted Gait: Relatively normal for age and body habitus Posture: WNL   Lower Extremity Exam    Side: Right lower extremity  Side: Left lower extremity  Skin & Extremity Inspection: Skin color, temperature, and hair growth are WNL. No peripheral edema or cyanosis. No masses, redness, swelling, asymmetry, or associated skin lesions. No contractures.  Skin & Extremity  Inspection: Skin color, temperature, and hair growth are WNL. No peripheral edema or cyanosis. No masses, redness, swelling, asymmetry, or associated skin lesions. No contractures.  Functional ROM: Unrestricted ROM          Functional ROM: Unrestricted ROM          Muscle Tone/Strength: Functionally intact. No obvious neuro-muscular anomalies detected.  Muscle Tone/Strength: Functionally intact. No obvious neuro-muscular anomalies detected.  Sensory (Neurological): Unimpaired  Sensory (Neurological): Unimpaired  Palpation: No palpable anomalies  Palpation: No palpable anomalies   Assessment  Primary Diagnosis & Pertinent Problem List: The primary encounter diagnosis was Spondylosis without myelopathy or radiculopathy, lumbar region. Diagnoses of Lumbar degenerative disc disease, Chronic pain syndrome, Facet arthropathy, lumbar, and Degenerative disc disease, cervical were also pertinent to this visit.  Status Diagnosis  Persistent Persistent Controlled 1. Spondylosis without myelopathy or radiculopathy, lumbar region   2. Lumbar degenerative disc disease   3. Chronic pain syndrome   4. Facet arthropathy, lumbar   5. Degenerative disc disease, cervical      General Recommendations: The pain condition that the patient suffers from is best treated with a multidisciplinary approach  that involves an increase in physical activity to prevent de-conditioning and worsening of the pain cycle, as well as psychological counseling (formal and/or informal) to address the co-morbid psychological affects of pain. Treatment will often involve judicious use of pain medications and interventional procedures to decrease the pain, allowing the patient to participate in the physical activity that will ultimately produce long-lasting pain reductions. The goal of the multidisciplinary approach is to return the patient to a higher level of overall function and to restore their ability to perform activities of daily living.  67 year old male with axial low back pain that radiates to bilateral lower extremities secondary to lumbar degenerative disc disease, lumbar facet arthropathy, lumbar spondylosis status post bilateral lumbar medial branch blocks which were not effective for his low back pain.  Patient presents today for follow-up and medication refill. Patient will continue his gabapentin 600 mg nightly, Cymbalta 600 mg nightly.  Patient will follow-up in 2 months for medication management.  Plan: -Oxycodone 10 mg 3 times daily as needed, quantity 29-monthPrescription provided for 2 months -Continue gabapentin 600 mg nightly -Continue Cymbalta 60 mg nightly -Follow-up in 2 months   Plan of Care  Pharmacotherapy (Medications Ordered): Meds ordered this encounter  Medications  . DISCONTD: oxyCODONE-acetaminophen (PERCOCET) 10-325 MG tablet    Sig: Take 1 tablet by mouth every 8 (eight) hours as needed for pain. For chronic pain    Dispense:  90 tablet    Refill:  0    Do not place this medication, or any other prescription from our practice, on "Automatic Refill". Patient may have prescription filled one day early if pharmacy is closed on scheduled refill date.  To fill on or after: 07/07/17, 08/06/17  . oxyCODONE-acetaminophen (PERCOCET) 10-325 MG tablet    Sig: Take 1 tablet by mouth  every 8 (eight) hours as needed for pain. For chronic pain    Dispense:  90 tablet    Refill:  0    Do not place this medication, or any other prescription from our practice, on "Automatic Refill". Patient may have prescription filled one day early if pharmacy is closed on scheduled refill date.  To fill on or after: 07/07/17, 08/06/17    Provider-requested follow-up: Return in about 10 weeks (around 08/28/2017) for Medication Management. Time Note: Greater than 50% of the 25 minute(s) of face-to-face  time spent with Mr. Furtick, was spent in counseling/coordination of care regarding: Mr. Gittleman primary cause of pain, medication side effects, realistic expectations, the medication agreement and the patient's responsibilities when it comes to controlled substances. Future Appointments  Date Time Provider [place]  08/28/2017 11:30 AM Gillis Santa, MD [facility] None    Primary Care Physician: Kirk Ruths, MD Location: [facility] Note by: Gillis Santa, M.D Date: 06/19/2017; Time: 2:40 PM  Patient Instructions  You have been given 2 scripts for Percocet today.

## 2017-06-19 NOTE — Progress Notes (Signed)
Nursing Pain Medication Assessment:  Safety precautions to be maintained throughout the outpatient stay will include: orient to surroundings, keep bed in low position, maintain call bell within reach at all times, provide assistance with transfer out of bed and ambulation.  Medication Inspection Compliance: Pill count conducted under aseptic conditions, in front of the patient. Neither the pills nor the bottle was removed from the patient's sight at any time. Once count was completed pills were immediately returned to the patient in their original bottle.  Medication: Oxycodone/APAP Pill/Patch Count: 57 of 90 pills remain Pill/Patch Appearance: Markings consistent with prescribed medication Bottle Appearance: Standard pharmacy container. Clearly labeled. Filled Date: 03 / 01/ 2019 Last Medication intake:  Today

## 2017-07-30 ENCOUNTER — Other Ambulatory Visit: Payer: Self-pay | Admitting: Student in an Organized Health Care Education/Training Program

## 2017-08-28 ENCOUNTER — Ambulatory Visit
Payer: PPO | Attending: Student in an Organized Health Care Education/Training Program | Admitting: Student in an Organized Health Care Education/Training Program

## 2017-08-28 ENCOUNTER — Other Ambulatory Visit: Payer: Self-pay | Admitting: Student in an Organized Health Care Education/Training Program

## 2017-08-28 ENCOUNTER — Encounter: Payer: Self-pay | Admitting: Student in an Organized Health Care Education/Training Program

## 2017-08-28 ENCOUNTER — Other Ambulatory Visit: Payer: Self-pay

## 2017-08-28 VITALS — BP 137/98 | HR 90 | Temp 98.2°F | Resp 18 | Ht 68.0 in | Wt 205.0 lb

## 2017-08-28 DIAGNOSIS — M545 Low back pain: Secondary | ICD-10-CM | POA: Diagnosis present

## 2017-08-28 DIAGNOSIS — F1120 Opioid dependence, uncomplicated: Secondary | ICD-10-CM | POA: Diagnosis not present

## 2017-08-28 DIAGNOSIS — M47816 Spondylosis without myelopathy or radiculopathy, lumbar region: Secondary | ICD-10-CM | POA: Insufficient documentation

## 2017-08-28 DIAGNOSIS — Z79891 Long term (current) use of opiate analgesic: Secondary | ICD-10-CM

## 2017-08-28 DIAGNOSIS — F329 Major depressive disorder, single episode, unspecified: Secondary | ICD-10-CM | POA: Insufficient documentation

## 2017-08-28 DIAGNOSIS — M5030 Other cervical disc degeneration, unspecified cervical region: Secondary | ICD-10-CM | POA: Insufficient documentation

## 2017-08-28 DIAGNOSIS — Z79899 Other long term (current) drug therapy: Secondary | ICD-10-CM | POA: Diagnosis not present

## 2017-08-28 DIAGNOSIS — M5136 Other intervertebral disc degeneration, lumbar region: Secondary | ICD-10-CM | POA: Diagnosis not present

## 2017-08-28 DIAGNOSIS — Z87891 Personal history of nicotine dependence: Secondary | ICD-10-CM | POA: Insufficient documentation

## 2017-08-28 DIAGNOSIS — Z7982 Long term (current) use of aspirin: Secondary | ICD-10-CM | POA: Diagnosis not present

## 2017-08-28 DIAGNOSIS — G894 Chronic pain syndrome: Secondary | ICD-10-CM | POA: Insufficient documentation

## 2017-08-28 MED ORDER — OXYCODONE-ACETAMINOPHEN 10-325 MG PO TABS: 1.0000 | ORAL_TABLET | Freq: Three times a day (TID) | ORAL | 0 refills | Status: DC | PRN

## 2017-08-28 NOTE — Progress Notes (Signed)
Nursing Pain Medication Assessment:  Safety precautions to be maintained throughout the outpatient stay will include: orient to surroundings, keep bed in low position, maintain call bell within reach at all times, provide assistance with transfer out of bed and ambulation.  Medication Inspection Compliance: Pill count conducted under aseptic conditions, in front of the patient. Neither the pills nor the bottle was removed from the patient's sight at any time. Once count was completed pills were immediately returned to the patient in their original bottle.  Medication: Oxycodone/APAP Pill/Patch Count: 23 of 90 pills remain Pill/Patch Appearance: Markings consistent with prescribed medication Bottle Appearance: Standard pharmacy container. Clearly labeled. Filled Date: 04/ 29/ 2019 Last Medication intake:  Today

## 2017-08-28 NOTE — Patient Instructions (Signed)
You have been given 3 scripts for Percocet today

## 2017-08-28 NOTE — Progress Notes (Signed)
Patient's Name: Gary [name]  MRN: [id number]  Referring Provider: Kirk Ruths, MD  DOB: 12/03/1950  PCP: Kirk Ruths, MD  DOS: 08/28/2017  Note by: Gillis Santa, MD  Service setting: Ambulatory outpatient  Specialty: Interventional Pain Management  Location: [facility] (AMB) Pain Management Facility    Patient type: Established   Primary Reason(s) for Visit: Encounter for prescription drug management. (Level of risk: moderate)  CC: Back Pain (low left)  HPI  Gary [name] is a 67 y.o. year old, male patient, who comes today for a medication management evaluation. He has Spondylosis without myelopathy or radiculopathy, lumbar region; Lumbar degenerative disc disease; Degenerative disc disease, cervical; Chronic pain syndrome; and Facet arthropathy, lumbar on their problem list. His primarily concern today is the Back Pain (low left)  Pain Assessment: Location: Lower Back Radiating: radiates into both legs at times Onset: More than a month ago Duration: Chronic pain Quality: Jabbing, Aching Severity: 9 /10 (subjective, self-reported pain score)  Note: Reported level is inconsistent with clinical observations. Clinically the patient looks like a 3/10 A 3/10 is viewed as "Moderate" and described as significantly interfering with activities of daily living (ADL). It becomes difficult to feed, bathe, get dressed, get on and off the toilet or to perform personal hygiene functions. Difficult to get in and out of bed or a chair without assistance. Very distracting. With effort, it can be ignored when deeply involved in activities. Information on the proper use of the pain scale provided to the patient today. When using our objective Pain Scale, levels between 6 and 10/10 are said to belong in an emergency room, as it progressively worsens from a 6/10, described as severely limiting, requiring emergency care not usually available at an outpatient pain management facility. At a 6/10 level,  communication becomes difficult and requires great effort. Assistance to reach the emergency department may be required. Facial flushing and profuse sweating along with potentially dangerous increases in heart rate and blood pressure will be evident. Effect on ADL:   Timing: Constant Modifying factors: medications BP: (!) 137/98(retake 136/101)  HR: 90  Gary [name] was last scheduled for an appointment on 07/30/2017 for medication management. During today's appointment we reviewed Mr. Nam chronic pain status, as well as his outpatient medication regimen.  The patient  reports that he does not use drugs. His body mass index is 31.17 kg/m.  Further details on both, my assessment(s), as well as the proposed treatment plan, please see below.  Controlled Substance Pharmacotherapy Assessment REMS (Risk Evaluation and Mitigation Strategy)  Analgesic:Oxycodone10 mg 3 times daily as needed, quantity 90 MME/day:Approximately21m/day.  TDewayne Shorter RN  08/28/2017 11:37 AM  Signed Nursing Pain Medication Assessment:  Safety precautions to be maintained throughout the outpatient stay will include: orient to surroundings, keep bed in low position, maintain call bell within reach at all times, provide assistance with transfer out of bed and ambulation.  Medication Inspection Compliance: Pill count conducted under aseptic conditions, in front of the patient. Neither the pills nor the bottle was removed from the patient's sight at any time. Once count was completed pills were immediately returned to the patient in their original bottle.  Medication: Oxycodone/APAP Pill/Patch Count: 23 of 90 pills remain Pill/Patch Appearance: Markings consistent with prescribed medication Bottle Appearance: Standard pharmacy container. Clearly labeled. Filled Date: 04/ 29/ 2019 Last Medication intake:  Today   Pharmacokinetics: Liberation and absorption (onset of action): WNL Distribution (time to peak  effect): WNL Metabolism and excretion (duration  of action): WNL         Pharmacodynamics: Desired effects: Analgesia: Gary [name] reports >50% benefit. Functional ability: Patient reports that medication allows him to accomplish basic ADLs Clinically meaningful improvement in function (CMIF): Sustained CMIF goals met Perceived effectiveness: Described as relatively effective, allowing for increase in activities of daily living (ADL) Undesirable effects: Side-effects or Adverse reactions: None reported Monitoring: [place] PMP: Online review of the past 61-monthperiod conducted. Compliant with practice rules and regulations Last UDS on record: Summary  Date Value Ref Range Status  01/22/2017 FINAL  Final    Comment:    ==================================================================== [facility] COMP DRUG ANALYSIS,UR ==================================================================== Test                             Result       Flag       Units Drug Present and Declared for Prescription Verification   Gabapentin                     PRESENT      EXPECTED Drug Present not Declared for Prescription Verification   Hydrocodone                    62           UNEXPECTED ng/mg creat   Hydromorphone                  49           UNEXPECTED ng/mg creat    Sources of hydrocodone include scheduled prescription    medications. Hydromorphone is an expected metabolite of    hydrocodone. Hydromorphone is also available as a scheduled    prescription medication.   Acetaminophen                  PRESENT      UNEXPECTED Drug Absent but Declared for Prescription Verification   Bupropion                      Not Detected UNEXPECTED   Salicylate                     Not Detected UNEXPECTED    Aspirin, as indicated in the declared medication list, is not    always detected even when used as directed. ==================================================================== Test                      Result     Flag   Units      Ref Range   Creatinine              145              mg/dL      >=20 ==================================================================== Declared Medications:  The flagging and interpretation on this report are based on the  following declared medications.  Unexpected results may arise from  inaccuracies in the declared medications.  **Note: The testing scope of this panel includes these medications:  Bupropion (Wellbutrin)  Gabapentin (Neurontin)  **Note: The testing scope of this panel does not include small to  moderate amounts of these reported medications:  Aspirin  **Note: The testing scope of this panel does not include following  reported medications:  Buspirone (BuSpar)  Omeprazole (Nexium) ==================================================================== For clinical consultation, please call ([phone] ====================================================================    UDS interpretation: Compliant  Medication Assessment Form: Reviewed. Patient indicates being compliant with therapy Treatment compliance: Compliant Risk Assessment Profile: Aberrant behavior: See prior evaluations. None observed or detected today Comorbid factors increasing risk of overdose: See prior notes. No additional risks detected today Risk of substance use disorder (SUD): Low Opioid Risk Tool - 08/28/17 1135      Family History of Substance Abuse   Alcohol  Negative    Illegal Drugs  Negative    Rx Drugs  Negative      Personal History of Substance Abuse   Alcohol  Negative    Illegal Drugs  Negative    Rx Drugs  Negative      Age   Age between 62-45 years   No      History of Preadolescent Sexual Abuse   History of Preadolescent Sexual Abuse  Negative or Male      Psychological Disease   Psychological Disease  Negative    Depression  Negative      Total Score   Opioid Risk Tool Scoring  0    Opioid Risk Interpretation  Low Risk      ORT  Scoring interpretation table:  Score <3 = Low Risk for SUD  Score between 4-7 = Moderate Risk for SUD  Score >8 = High Risk for Opioid Abuse   Risk Mitigation Strategies:  Patient Counseling: Covered Patient-Prescriber Agreement (PPA): Present and active  Notification to other healthcare providers: Done  Pharmacologic Plan: No change in therapy, at this time.             Laboratory Chemistry  Inflammation Markers (CRP: Acute Phase) (ESR: Chronic Phase) No results found for: CRP, ESRSEDRATE, LATICACIDVEN                       Rheumatology Markers No results found for: RF, ANA, LABURIC, URICUR, LYMEIGGIGMAB, LYMEABIGMQN                      Renal Function Markers No results found for: BUN, CREATININE, BCR, GFRAA, GFRNONAA                            Hepatic Function Markers No results found for: AST, ALT, ALBUMIN, ALKPHOS, HCVAB, AMYLASE, LIPASE, AMMONIA                      Electrolytes No results found for: NA, K, CL, CALCIUM, MG, PHOS                      Neuropathy Markers No results found for: VITAMINB12, FOLATE, HGBA1C, HIV                      Bone Pathology Markers No results found for: VD25OH, [id number], [id number], [id number], 25OHVITD1, 25OHVITD2, 25OHVITD3, TESTOFREE, TESTOSTERONE                       Coagulation Parameters No results found for: INR, LABPROT, APTT, PLT, DDIMER                      Cardiovascular Markers No results found for: BNP, CKTOTAL, CKMB, TROPONINI, HGB, HCT                       CA Markers No results found for: CEA, CA125, LABCA2  Note: Lab results reviewed.  Recent Diagnostic Imaging Results  DG C-Arm 1-60 Min-No Report Fluoroscopy was utilized by the requesting physician.  No radiographic  interpretation.   Complexity Note: Imaging results reviewed. Results shared with Mr. Heitman, using Layman's terms.                         Meds   Current Outpatient Medications:  .  acetaminophen (TYLENOL) 500 MG  tablet, Take as needed by mouth. , Disp: , Rfl:  .  Albuterol Sulfate 108 (90 Base) MCG/ACT AEPB, Inhale as needed into the lungs. , Disp: , Rfl:  .  aspirin 81 MG tablet, Take by mouth., Disp: , Rfl:  .  buPROPion (WELLBUTRIN XL) 300 MG 24 hr tablet, Take 300 mg by mouth daily., Disp: , Rfl:  .  busPIRone (BUSPAR) 15 MG tablet, Take 15 mg by mouth 2 (two) times daily., Disp: , Rfl:  .  cholecalciferol (VITAMIN D) 1000 units tablet, Take by mouth., Disp: , Rfl:  .  DULoxetine (CYMBALTA) 60 MG capsule, Take 1 capsule (60 mg total) by mouth daily., Disp: 30 capsule, Rfl: 4 .  esomeprazole (NEXIUM) 20 MG packet, Take 20 mg by mouth daily before breakfast., Disp: , Rfl:  .  fenofibrate (TRICOR) 48 MG tablet, Take 48 mg daily by mouth. , Disp: , Rfl:  .  finasteride (PROSCAR) 5 MG tablet, Take 5 mg daily by mouth. , Disp: , Rfl:  .  gabapentin (NEURONTIN) 300 MG capsule, Take 2 capsules (600 mg total) by mouth at bedtime., Disp: 60 capsule, Rfl: 4 .  niacin-lovastatin (ADVICOR) 1000-20 MG 24 hr tablet, Take by mouth., Disp: , Rfl:  .  oxyCODONE-acetaminophen (PERCOCET) 10-325 MG tablet, Take 1 tablet by mouth every 8 (eight) hours as needed for pain. For chronic pain, Disp: 90 tablet, Rfl: 0 .  pravastatin (PRAVACHOL) 20 MG tablet, Take by mouth., Disp: , Rfl:  .  aspirin 325 MG tablet, Take 325 mg by mouth daily., Disp: , Rfl:  .  buPROPion (WELLBUTRIN XL) 300 MG 24 hr tablet, Take by mouth., Disp: , Rfl:  .  busPIRone (BUSPAR) 15 MG tablet, Take by mouth., Disp: , Rfl:  .  esomeprazole (NEXIUM) 40 MG capsule, Take by mouth., Disp: , Rfl:   ROS  Constitutional: Denies any fever or chills Gastrointestinal: No reported hemesis, hematochezia, vomiting, or acute GI distress Musculoskeletal: Denies any acute onset joint swelling, redness, loss of ROM, or weakness Neurological: No reported episodes of acute onset apraxia, aphasia, dysarthria, agnosia, amnesia, paralysis, loss of coordination, or loss of  consciousness  Allergies  Mr. Swails is allergic to niacin-lovastatin er; atorvastatin; simvastatin; and propoxyphene.  [place]  Drug: Mr. Binsfeld  reports that he does not use drugs. Alcohol:  reports that he does not drink alcohol. Tobacco:  reports that he quit smoking about 5 years ago. He has never used smokeless tobacco. Medical:  has a past medical history of Depression. Surgical: Mr. Rocca  has a past surgical history that includes Cholecystectomy; Knee surgery (Left); and deviated septum repair. Family: family history includes Heart disease in his mother.  Constitutional Exam  General appearance: Well nourished, well developed, and well hydrated. In no apparent acute distress Vitals:   08/28/17 1129 08/28/17 1131  BP:  (!) 137/98  Pulse: 90   Resp: 18   Temp: 98.2 F (36.8 C)   SpO2: 97%   Weight: 205 lb (93 kg)   Height: _0  (  1.727 m)    BMI Assessment: Estimated body mass index is 31.17 kg/m as calculated from the following:   Height as of this encounter: _0  (1.727 m).   Weight as of this encounter: 205 lb (93 kg).  BMI interpretation table: BMI level Category Range association with higher incidence of chronic pain  <18 kg/m2 Underweight   18.5-24.9 kg/m2 Ideal body weight   25-29.9 kg/m2 Overweight Increased incidence by 20%  30-34.9 kg/m2 Obese (Class I) Increased incidence by 68%  35-39.9 kg/m2 Severe obesity (Class II) Increased incidence by 136%  >40 kg/m2 Extreme obesity (Class III) Increased incidence by 254%   Patient's current BMI Ideal Body weight  Body mass index is 31.17 kg/m. Ideal body weight: 68.4 kg (150 lb 12.7 oz) Adjusted ideal body weight: 78.2 kg (172 lb 7.6 oz)   BMI Readings from Last 4 Encounters:  08/28/17 31.17 kg/m  06/19/17 31.93 kg/m  04/24/17 31.93 kg/m  03/12/17 31.93 kg/m   Wt Readings from Last 4 Encounters:  08/28/17 205 lb (93 kg)  06/19/17 210 lb (95.3 kg)  04/24/17 210 lb (95.3 kg)  03/12/17 210 lb (95.3  kg)  Psych/Mental status: Alert, oriented x 3 (person, place, & time)       Eyes: PERLA Respiratory: No evidence of acute respiratory distress  Cervical Spine Area Exam  Skin & Axial Inspection: No masses, redness, edema, swelling, or associated skin lesions Alignment: Symmetrical Functional ROM: Unrestricted ROM      Stability: No instability detected Muscle Tone/Strength: Functionally intact. No obvious neuro-muscular anomalies detected. Sensory (Neurological): Unimpaired Palpation: No palpable anomalies              Upper Extremity (UE) Exam    Side: Right upper extremity  Side: Left upper extremity  Skin & Extremity Inspection: Skin color, temperature, and hair growth are WNL. No peripheral edema or cyanosis. No masses, redness, swelling, asymmetry, or associated skin lesions. No contractures.  Skin & Extremity Inspection: Skin color, temperature, and hair growth are WNL. No peripheral edema or cyanosis. No masses, redness, swelling, asymmetry, or associated skin lesions. No contractures.  Functional ROM: Unrestricted ROM          Functional ROM: Unrestricted ROM          Muscle Tone/Strength: Functionally intact. No obvious neuro-muscular anomalies detected.  Muscle Tone/Strength: Functionally intact. No obvious neuro-muscular anomalies detected.  Sensory (Neurological): Unimpaired          Sensory (Neurological): Unimpaired          Palpation: No palpable anomalies              Palpation: No palpable anomalies              Provocative Test(s):  Phalen's test: deferred Tinel's test: deferred Apley's scratch test (touch opposite shoulder):  Action 1 (Across chest): deferred Action 2 (Overhead): deferred Action 3 (LB reach): deferred   Provocative Test(s):  Phalen's test: deferred Tinel's test: deferred Apley's scratch test (touch opposite shoulder):  Action 1 (Across chest): deferred Action 2 (Overhead): deferred Action 3 (LB reach): deferred    Thoracic Spine Area Exam   Skin & Axial Inspection: No masses, redness, or swelling Alignment: Symmetrical Functional ROM: Unrestricted ROM Stability: No instability detected Muscle Tone/Strength: Functionally intact. No obvious neuro-muscular anomalies detected. Sensory (Neurological): Unimpaired Muscle strength & Tone: No palpable anomalies  Lumbar Spine Area Exam  Skin & Axial Inspection: No masses, redness, or swelling Alignment: Symmetrical Functional ROM: Decreased ROM  Stability: No instability detected Muscle Tone/Strength: Functionally intact. No obvious neuro-muscular anomalies detected. Sensory (Neurological): Musculoskeletal pain pattern Palpation: No palpable anomalies       Provocative Tests: Lumbar Hyperextension/rotation test: Positive bilaterally for facet joint pain. Lumbar quadrant test (Kemp's test): (+) bilaterally for facet joint pain. Lumbar Lateral bending test: deferred today       Patrick's Maneuver: deferred today                   FABER test: deferred today       Thigh-thrust test: deferred today       S-I compression test: deferred today       S-I distraction test: deferred today        Gait & Posture Assessment  Ambulation: Unassisted Gait: Relatively normal for age and body habitus Posture: WNL   Lower Extremity Exam    Side: Right lower extremity  Side: Left lower extremity  Stability: No instability observed          Stability: No instability observed          Skin & Extremity Inspection: Skin color, temperature, and hair growth are WNL. No peripheral edema or cyanosis. No masses, redness, swelling, asymmetry, or associated skin lesions. No contractures.  Skin & Extremity Inspection: Skin color, temperature, and hair growth are WNL. No peripheral edema or cyanosis. No masses, redness, swelling, asymmetry, or associated skin lesions. No contractures.  Functional ROM: Unrestricted ROM                  Functional ROM: Unrestricted ROM                  Muscle  Tone/Strength: Functionally intact. No obvious neuro-muscular anomalies detected.  Muscle Tone/Strength: Functionally intact. No obvious neuro-muscular anomalies detected.  Sensory (Neurological): Unimpaired  Sensory (Neurological): Unimpaired  Palpation: No palpable anomalies  Palpation: No palpable anomalies   Assessment  Primary Diagnosis & Pertinent Problem List: The primary encounter diagnosis was Spondylosis without myelopathy or radiculopathy, lumbar region. Diagnoses of Lumbar degenerative disc disease, Chronic pain syndrome, Facet arthropathy, lumbar, Degenerative disc disease, cervical, and Chronic use of opiate for therapeutic purpose were also pertinent to this visit.  Status Diagnosis  Controlled Controlled Controlled 1. Spondylosis without myelopathy or radiculopathy, lumbar region   2. Lumbar degenerative disc disease   3. Chronic pain syndrome   4. Facet arthropathy, lumbar   5. Degenerative disc disease, cervical   6. Chronic use of opiate for therapeutic purpose      General Recommendations: The pain condition that the patient suffers from is best treated with a multidisciplinary approach that involves an increase in physical activity to prevent de-conditioning and worsening of the pain cycle, as well as psychological counseling (formal and/or informal) to address the co-morbid psychological affects of pain. Treatment will often involve judicious use of pain medications and interventional procedures to decrease the pain, allowing the patient to participate in the physical activity that will ultimately produce long-lasting pain reductions. The goal of the multidisciplinary approach is to return the patient to a higher level of overall function and to restore their ability to perform activities of daily living.  67 year old male with axial low back pain that radiates to bilateral lower extremities secondary to lumbar degenerative disc disease, lumbar facet arthropathy, lumbar  spondylosis status post bilateral lumbar medial branch blocks which were not effective for his low back pain.Patient presents today for medication refill. Patient will continue his gabapentin 600 mg nightly, Cymbalta  60 mg nightly. Patient will follow-up in 3 months for medication management.  UDS up-to-date and appropriate.  [place] PMP checked and appropriate.  Plan: -Oxycodone 10 mg 3 times daily as needed, quantity 52-monthPrescription provided for 3 months -Continuegabapentin 600 mg nightly -ContinueCymbalta 60 mg nightly -Follow-up in 3 months   Plan of Care  Pharmacotherapy (Medications Ordered): Meds ordered this encounter  Medications  . DISCONTD: oxyCODONE-acetaminophen (PERCOCET) 10-325 MG tablet    Sig: Take 1 tablet by mouth every 8 (eight) hours as needed for pain. For chronic pain    Dispense:  90 tablet    Refill:  0    Do not place this medication, or any other prescription from our practice, on "Automatic Refill". Patient may have prescription filled one day early if pharmacy is closed on scheduled refill date.  To fill on or after: 09/04/17, 10/04/17, 11/02/17  . DISCONTD: oxyCODONE-acetaminophen (PERCOCET) 10-325 MG tablet    Sig: Take 1 tablet by mouth every 8 (eight) hours as needed for pain. For chronic pain    Dispense:  90 tablet    Refill:  0    Do not place this medication, or any other prescription from our practice, on "Automatic Refill". Patient may have prescription filled one day early if pharmacy is closed on scheduled refill date.  To fill on or after: 09/04/17, 10/04/17, 11/02/17  . oxyCODONE-acetaminophen (PERCOCET) 10-325 MG tablet    Sig: Take 1 tablet by mouth every 8 (eight) hours as needed for pain. For chronic pain    Dispense:  90 tablet    Refill:  0    Do not place this medication, or any other prescription from our practice, on "Automatic Refill". Patient may have prescription filled one day early if pharmacy is closed on  scheduled refill date.  To fill on or after: 09/04/17, 10/04/17, 11/02/17   Time Note: Greater than 50% of the 25 minute(s) of face-to-face time spent with Mr. RNobbe was spent in counseling/coordination of care regarding: Mr. RCindricprimary cause of pain, the treatment plan, treatment alternatives, the opioid analgesic risks and possible complications, the appropriate use of his medications, the medication agreement and the patient's responsibilities when it comes to controlled substances.  Provider-requested follow-up: Return in about 3 months (around 11/28/2017) for Medication Management.  Future Appointments  Date Time Provider DWhiting 11/27/2017 10:45 AM [facility]illis Santa [facility]    Primary Care Physician: AKirk Ruths MD [facility] Note by: BGillis Santa M.D Date: 08/28/2017; Time: 2:40 PM  Patient Instructions  You have been given 3 scripts for Percocet today

## 2017-09-04 ENCOUNTER — Other Ambulatory Visit: Payer: Self-pay | Admitting: Nurse Practitioner

## 2017-09-04 ENCOUNTER — Telehealth: Payer: Self-pay | Admitting: Student in an Organized Health Care Education/Training Program

## 2017-09-04 MED ORDER — GABAPENTIN 300 MG PO CAPS: 600.0000 mg | ORAL_CAPSULE | Freq: Every day | ORAL | 4 refills | Status: DC

## 2017-09-04 NOTE — Telephone Encounter (Signed)
Refills sent

## 2017-09-04 NOTE — Telephone Encounter (Signed)
Patient was seen on 08/28/17 for pain medication will send to [facility] to see if she will escribe gabapentin since he was seen this month.

## 2017-09-04 NOTE — Telephone Encounter (Signed)
Patient needs refill called in [facility]

## 2017-09-05 NOTE — Telephone Encounter (Signed)
Voicemail left with patient that refills have been sent.

## 2017-10-09 NOTE — Progress Notes (Incomplete)
10/10/2017 11:15 PM   Gary [name] 16-Aug-1950 [id number]  Referring provider: Kirk Ruths, MD [facility] [place], [place] [address]  No chief complaint on file.   HPI: Patient is a 67 year old Caucasian male with BPH with LU TS who presents for an office visit.  BPH WITH LUTS  (prostate and/or bladder) IPSS score: *** PVR: ***   Previous score: ***   Previous PVR: ***   Major complaint(s):  x *** years. Denies any dysuria, hematuria or suprapubic pain.   Currently taking: ***.  His has had ***.   Denies any recent fevers, chills, nausea or vomiting.  He has a family history of PCa, with ***.   He does not have a family history of PCa.***    Score:  1-7 Mild 8-19 Moderate 20-35 Severe    PMH: Past Medical History:  Diagnosis Date  . Depression     Surgical History: Past Surgical History:  Procedure Laterality Date  . CHOLECYSTECTOMY    . deviated septum repair    . KNEE SURGERY Left     Home Medications:  Allergies as of 10/10/2017      Reactions   Niacin-lovastatin Er Other (See Comments)   unknown unknown   Atorvastatin Other (See Comments)   Simvastatin Other (See Comments)   Propoxyphene Nausea Only      Medication List        Accurate as of 10/09/17 11:15 PM. Always use your most recent med list.          acetaminophen 500 MG tablet Commonly known as:  TYLENOL Take as needed by mouth.   Albuterol Sulfate 108 (90 Base) MCG/ACT Aepb Inhale as needed into the lungs.   aspirin 325 MG tablet Take 325 mg by mouth daily.   aspirin 81 MG tablet Take by mouth.   buPROPion 300 MG 24 hr tablet Commonly known as:  WELLBUTRIN XL Take 300 mg by mouth daily.   buPROPion 300 MG 24 hr tablet Commonly known as:  WELLBUTRIN XL Take by mouth.   busPIRone 15 MG tablet Commonly known as:  BUSPAR Take 15 mg by mouth 2 (two) times daily.   busPIRone 15 MG tablet Commonly known as:  BUSPAR Take  by mouth.   cholecalciferol 1000 units tablet Commonly known as:  VITAMIN D Take by mouth.   DULoxetine 60 MG capsule Commonly known as:  CYMBALTA Take 1 capsule (60 mg total) by mouth daily.   esomeprazole 20 MG packet Commonly known as:  NEXIUM Take 20 mg by mouth daily before breakfast.   esomeprazole 40 MG capsule Commonly known as:  NEXIUM Take by mouth.   fenofibrate 48 MG tablet Commonly known as:  TRICOR Take 48 mg daily by mouth.   finasteride 5 MG tablet Commonly known as:  PROSCAR Take 5 mg daily by mouth.   gabapentin 300 MG capsule Commonly known as:  NEURONTIN Take 2 capsules (600 mg total) by mouth at bedtime.   niacin-lovastatin 1000-20 MG 24 hr tablet Commonly known as:  ADVICOR Take by mouth.   oxyCODONE-acetaminophen 10-325 MG tablet Commonly known as:  PERCOCET Take 1 tablet by mouth every 8 (eight) hours as needed for pain. For chronic pain   pravastatin 20 MG tablet Commonly known as:  PRAVACHOL Take by mouth.       Allergies:  Allergies  Allergen Reactions  . Niacin-Lovastatin Er Other (See Comments)    unknown unknown  .  Atorvastatin Other (See Comments)  . Simvastatin Other (See Comments)  . Propoxyphene Nausea Only    Family History: Family History  Problem Relation Age of Onset  . Heart disease Mother     Social History:  reports that he quit smoking about 5 years ago. He has never used smokeless tobacco. He reports that he does not drink alcohol or use drugs.  ROS:                                        Physical Exam: There were no vitals taken for this visit.  Constitutional:  Well nourished. Alert and oriented, No acute distress. HEENT: [place] AT, moist mucus membranes.  Trachea midline, no masses. Cardiovascular: No clubbing, cyanosis, or edema. Respiratory: Normal respiratory effort, no increased work of breathing. GI: Abdomen is soft, non tender, non distended, no abdominal masses. Liver and  spleen not palpable.  No hernias appreciated.  Stool sample for occult testing is not indicated.   GU: No CVA tenderness.  No bladder fullness or masses.  Patient with circumcised/uncircumcised phallus. ***Foreskin easily retracted***  Urethral meatus is patent.  No penile discharge. No penile lesions or rashes. Scrotum without lesions, cysts, rashes and/or edema.  Testicles are located scrotally bilaterally. No masses are appreciated in the testicles. Left and right epididymis are normal. Rectal: Patient with  normal sphincter tone. Anus and perineum without scarring or rashes. No rectal masses are appreciated. Prostate is approximately *** grams, *** nodules are appreciated. Seminal vesicles are normal. Skin: No rashes, bruises or suspicious lesions. Lymph: No cervical or inguinal adenopathy. Neurologic: Grossly intact, no focal deficits, moving all 4 extremities. Psychiatric: Normal mood and affect.  Laboratory Data: No results found for: WBC, HGB, HCT, MCV, PLT  No results found for: CREATININE  No results found for: PSA  No results found for: TESTOSTERONE  No results found for: HGBA1C  No results found for: TSH  No results found for: CHOL, HDL, CHOLHDL, VLDL, LDLCALC  No results found for: AST No results found for: ALT No components found for: ALKALINEPHOPHATASE No components found for: BILIRUBINTOTAL  No results found for: ESTRADIOL  Urinalysis No results found for: COLORURINE, APPEARANCEUR, LABSPEC, PHURINE, GLUCOSEU, HGBUR, BILIRUBINUR, KETONESUR, PROTEINUR, UROBILINOGEN, NITRITE, LEUKOCYTESUR  I have reviewed the labs.   Pertinent Imaging: *** I have independently reviewed the films.    Assessment & Plan:  ***  1. BPH with LUTS IPSS score is ***, it is stable/improving/worsening Continue conservative management, avoiding bladder irritants and timed voiding's Most bothersome symptoms is/are *** Initiate alpha-blocker (***), discussed side effects *** Initiate 5  alpha reductase inhibitor (***), discussed side effects *** Continue tamsulosin 0.4 mg daily, alfuzosin 10 mg daily, Rapaflo 8 mg daily, terazosin, doxazosin, Cialis 5 mg daily and finasteride 5 mg daily, dutasteride 0.5 mg daily***:refills given Cannot tolerate medication or medication failure, schedule cystoscopy *** RTC in *** months for IPSS, PSA, PVR and exam      No follow-ups on file.  These notes generated with voice recognition software. I apologize for typographical errors.  Zara Council, PA-C  [facility] [address]  [place], [place] [address] [phone]

## 2017-10-10 ENCOUNTER — Ambulatory Visit: Payer: PPO | Admitting: Urology

## 2017-10-10 ENCOUNTER — Encounter: Payer: Self-pay | Admitting: Urology

## 2017-10-10 VITALS — BP 144/94 | HR 111 | Ht 68.0 in | Wt 210.0 lb

## 2017-10-10 DIAGNOSIS — M79673 Pain in unspecified foot: Secondary | ICD-10-CM

## 2017-10-10 DIAGNOSIS — N402 Nodular prostate without lower urinary tract symptoms: Secondary | ICD-10-CM | POA: Diagnosis not present

## 2017-10-10 DIAGNOSIS — Z125 Encounter for screening for malignant neoplasm of prostate: Secondary | ICD-10-CM | POA: Diagnosis not present

## 2017-10-10 DIAGNOSIS — M1991 Primary osteoarthritis, unspecified site: Secondary | ICD-10-CM | POA: Insufficient documentation

## 2017-10-10 DIAGNOSIS — N529 Male erectile dysfunction, unspecified: Secondary | ICD-10-CM | POA: Diagnosis not present

## 2017-10-10 DIAGNOSIS — N401 Enlarged prostate with lower urinary tract symptoms: Secondary | ICD-10-CM

## 2017-10-10 DIAGNOSIS — N138 Other obstructive and reflux uropathy: Secondary | ICD-10-CM

## 2017-10-10 HISTORY — DX: Pain in unspecified foot: M79.673

## 2017-10-10 LAB — BLADDER SCAN AMB NON-IMAGING: Scan Result: 150

## 2017-10-10 MED ORDER — SILDENAFIL CITRATE 20 MG PO TABS: ORAL_TABLET | ORAL | 3 refills | Status: AC

## 2017-10-10 NOTE — Progress Notes (Signed)
10/10/2017 4:07 PM   Charlean Merl 1950-06-17 [id number]  Referring provider: Kirk Ruths, MD [facility] [place], [place] [address]  Chief Complaint  Patient presents with  . New Patient (Initial Visit)    HPI: Patient is a 67 year old Caucasian male with BPH with LU TS who presents for an office visit.  BPH WITH LUTS  (prostate and/or bladder) IPSS score: 15/4  PVR: 150 mL  Major complaint(s): frequency, nocturia, incontinence, intermittency, hesitancy and a weak urinary stream  x several months.  Denies any dysuria, hematuria or suprapubic pain.   Denies any recent fevers, chills, nausea or vomiting.  He does not have a family history of PCa.  IPSS    Row Name 10/10/17 1500         International Prostate Symptom Score   How often have you had the sensation of not emptying your bladder?  About half the time     How often have you had to urinate less than every two hours?  About half the time     How often have you found you stopped and started again several times when you urinated?  About half the time     How often have you found it difficult to postpone urination?  Less than half the time     How often have you had a weak urinary stream?  Less than half the time     How often have you had to strain to start urination?  Not at All     How many times did you typically get up at night to urinate?  2 Times     Total IPSS Score  15       Quality of Life due to urinary symptoms   If you were to spend the rest of your life with your urinary condition just the way it is now how would you feel about that?  Mostly Disatisfied        Score:  1-7 Mild 8-19 Moderate 20-35 Severe  ED Patient is having erections lasting long enough for intercourse.  He has sleep apnea and does not sleep with CPAP machine.     PMH: Past Medical History:  Diagnosis Date  . Benign essential HTN 06/24/2015   Last Assessment & Plan:  Taking  medications without noted side effects or dizziness.   Overview:  Last Assessment & Plan:  Is compliant with hypertensive medications without clear side effects or lack of control.    . Chronic hyperglycemia 04/13/2014   Last Assessment & Plan:  Glucose is controlled and followed on his diet.  Overview:  Last Assessment & Plan:  Glucose is controlled   . Coronary artery disease 02/16/2014   Overview:  Minimal disease by cath 12/13  Last Assessment & Plan:  Seems to be tolerating medical regimen without significant side effects and symptoms such as worsening chest pain are not noted.   Overview:  Overview:  Minimal disease by cath 12/13  Last Assessment & Plan:  Seems to be tolerating medical regimen without significant side effects and symptoms such as worsening chest pain are not no  . Degenerative disc disease, cervical 01/22/2017  . Depression   . Heel pain 10/10/2017  . Lumbar degenerative disc disease 01/22/2017  . Major depression in remission ([place]) 03/10/2014   Last Assessment & Plan:  Mood is doing well on meds Overview:  Last Assessment & Plan:  Mood is doing  well on meds   . Mixed hyperlipidemia 02/16/2014   Last Assessment & Plan:  Diet for healthy cholesterol is being attempted and no clear myalgia's or other side effects are noted.   Overview:  Last Assessment & Plan:  Low fat diet is being attempted and no significant side effects such as myalgia's are noted.    . Neck pain 01/24/2017  . OSA (obstructive sleep apnea) 07/05/2016   Last Assessment & Plan:  Continues to use cpap consistently and is continuing to benefit from it's use.    . Osteoarthritis of knee 08/12/2014   Overview:  Right knee is worse now  Last Assessment & Plan:  Diffuse pain on back in hips and knees also. Went to a pain clinic  Overview:  Overview:  Right knee is worse now  Last Assessment & Plan:  Diffuse pain on back in hips and knees also. Went to a pain clinic   . Spinal stenosis of lumbar region with neurogenic  claudication 04/18/2016  . Spondylosis without myelopathy or radiculopathy, lumbar region 01/22/2017  . Venous insufficiency of both lower extremities 11/07/2016    Surgical History: Past Surgical History:  Procedure Laterality Date  . CHOLECYSTECTOMY    . deviated septum repair    . KNEE SURGERY Left     Home Medications:  Allergies as of 10/10/2017      Reactions   Niacin-lovastatin Er Other (See Comments)   unknown unknown   Atorvastatin Other (See Comments)   Simvastatin Other (See Comments)   Propoxyphene Nausea Only      Medication List        Accurate as of 10/10/17  4:07 PM. Always use your most recent med list.          acetaminophen 500 MG tablet Commonly known as:  TYLENOL Take as needed by mouth.   Albuterol Sulfate 108 (90 Base) MCG/ACT Aepb Inhale as needed into the lungs.   aspirin 81 MG tablet Take by mouth.   buPROPion 300 MG 24 hr tablet Commonly known as:  WELLBUTRIN XL Take by mouth.   busPIRone 15 MG tablet Commonly known as:  BUSPAR Take by mouth.   cholecalciferol 1000 units tablet Commonly known as:  VITAMIN D Take by mouth.   DULoxetine 60 MG capsule Commonly known as:  CYMBALTA Take 1 capsule (60 mg total) by mouth daily.   esomeprazole 40 MG capsule Commonly known as:  NEXIUM Take by mouth.   ezetimibe 10 MG tablet Commonly known as:  ZETIA   fenofibrate 48 MG tablet Commonly known as:  TRICOR Take 48 mg daily by mouth.   finasteride 5 MG tablet Commonly known as:  PROSCAR Take 5 mg daily by mouth.   gabapentin 300 MG capsule Commonly known as:  NEURONTIN Take 2 capsules (600 mg total) by mouth at bedtime.   hydrochlorothiazide 25 MG tablet Commonly known as:  HYDRODIURIL   niacin-lovastatin 1000-20 MG 24 hr tablet Commonly known as:  ADVICOR Take by mouth.   oxyCODONE-acetaminophen 10-325 MG tablet Commonly known as:  PERCOCET Take 1 tablet by mouth every 8 (eight) hours as needed for pain. For chronic pain     pravastatin 20 MG tablet Commonly known as:  PRAVACHOL Take by mouth.   sildenafil 20 MG tablet Commonly known as:  REVATIO Take 3 to 5 tablets two hours before intercouse on an empty stomach.  Do not take with nitrates.       Allergies:  Allergies  Allergen Reactions  .  Niacin-Lovastatin Er Other (See Comments)    unknown unknown  . Atorvastatin Other (See Comments)  . Simvastatin Other (See Comments)  . Propoxyphene Nausea Only    Family History: Family History  Problem Relation Age of Onset  . Heart disease Mother     Social History:  reports that he quit smoking about 5 years ago. He has never used smokeless tobacco. He reports that he does not drink alcohol or use drugs.  ROS: UROLOGY Frequent Urination?: Yes Hard to postpone urination?: No Burning/pain with urination?: No Get up at night to urinate?: Yes Leakage of urine?: Yes Urine stream starts and stops?: Yes Trouble starting stream?: Yes Do you have to strain to urinate?: No Blood in urine?: No Urinary tract infection?: No Sexually transmitted disease?: No Injury to kidneys or bladder?: No Painful intercourse?: No Weak stream?: Yes Erection problems?: Yes Penile pain?: No  Gastrointestinal Nausea?: No Vomiting?: No Indigestion/heartburn?: No Diarrhea?: No Constipation?: No  Constitutional Fever: No Night sweats?: Yes Weight loss?: No Fatigue?: Yes  Skin Skin rash/lesions?: Yes Itching?: No  Eyes Blurred vision?: No Double vision?: No  Ears/Nose/Throat Sore throat?: No Sinus problems?: No  Hematologic/Lymphatic Swollen glands?: No Easy bruising?: No  Cardiovascular Leg swelling?: No Chest pain?: Yes  Respiratory Cough?: No Shortness of breath?: Yes  Endocrine Excessive thirst?: Yes  Musculoskeletal Back pain?: Yes Joint pain?: Yes  Neurological Headaches?: Yes Dizziness?: Yes  Psychologic Depression?: Yes Anxiety?: Yes  Physical Exam: BP (!) 144/94    Pulse (!) 111   Ht 5\' 8"  (1.727 m)   Wt 210 lb (95.3 kg)   BMI 31.93 kg/m   Constitutional:  Well nourished. Alert and oriented, No acute distress. HEENT: [place] AT, moist mucus membranes.  Trachea midline, no masses. Cardiovascular: No clubbing, cyanosis, or edema. Respiratory: Normal respiratory effort, no increased work of breathing. GI: Abdomen is soft, non tender, non distended, no abdominal masses. Liver and spleen not palpable.  No hernias appreciated.  Stool sample for occult testing is not indicated.   GU: No CVA tenderness.  No bladder fullness or masses.  Patient with circumcised phallus.   Urethral meatus is patent.  No penile discharge. No penile lesions or rashes. Scrotum without lesions, cysts, rashes and/or edema.  Testicles are located scrotally bilaterally. No masses are appreciated in the testicles. Left and right epididymis are normal. Rectal: Patient with  normal sphincter tone. Anus and perineum without scarring or rashes. No rectal masses are appreciated. Prostate is approximately 50 grams, 3 mm x 3 mm nodules are appreciated. Seminal vesicles are normal. Skin: No rashes, bruises or suspicious lesions. Lymph: No cervical or inguinal adenopathy. Neurologic: Grossly intact, no focal deficits, moving all 4 extremities. Psychiatric: Normal mood and affect.  Laboratory Data: No results found for: WBC, HGB, HCT, MCV, PLT  No results found for: CREATININE  No results found for: PSA  No results found for: TESTOSTERONE  No results found for: HGBA1C  No results found for: TSH  No results found for: CHOL, HDL, CHOLHDL, VLDL, LDLCALC  No results found for: AST No results found for: ALT No components found for: ALKALINEPHOPHATASE No components found for: BILIRUBINTOTAL  No results found for: ESTRADIOL  Urinalysis No results found for: COLORURINE, APPEARANCEUR, LABSPEC, PHURINE, GLUCOSEU, HGBUR, BILIRUBINUR, KETONESUR, PROTEINUR, UROBILINOGEN, NITRITE, LEUKOCYTESUR  I  have reviewed the labs.   Pertinent Imaging: Results for CLENT, DAMORE (MRN [id number]) as of 10/10/2017 21:19  Ref. Range 10/10/2017 21:17  Scan Result Unknown 150 mL  Assessment & Plan:    1. BPH with LUTS IPSS score is 15/4 Continue conservative management, avoiding bladder irritants and timed voiding's Most bothersome symptoms is/are weak stream Start tamsulosin 0.4 mg daily RTC pending biopsy results   2. Prostate nodule Patient will be schedule for a TRUSPBx of prostate.  The procedure is explained and the risks involved, such as blood in urine, blood in stool, blood in semen, infection, urinary retention, and on rare occasions sepsis and death.  Patient understands the risks as explained to him and he wishes to proceed.  Patient is on ASA and is advised to discontinue the medication ten days prior to the biopsy.   3. ED Patient given a script for sildenafil 20 mg, 3 to 5 tablets two hours prior to intercourse on an empty stomach, # 50; he is warned not to take medications that contain nitrates.  I also advised him of the side effects, such as: headache, flushing, dyspepsia, abnormal vision, nasal congestion, back pain, myalgia, nausea, dizziness, and rash. Encourage the patient to sleep with CPAP    Return for TRUSPBx of prostate; patient takes ASA .  These notes generated with voice recognition software. I apologize for typographical errors.  Zara Council, PA-C  [facility] [address]  [place], [place] [address] [phone]

## 2017-10-11 LAB — PSA: Prostate Specific Ag, Serum: 1.3 ng/mL (ref 0.0–4.0)

## 2017-10-12 ENCOUNTER — Telehealth: Payer: Self-pay

## 2017-10-12 NOTE — Telephone Encounter (Signed)
-----   Message from Nori Riis, PA-C sent at 10/12/2017  8:27 AM EDT ----- Please let Mr. Kohlmeyer know that his PSA is 1.3.

## 2017-10-12 NOTE — Telephone Encounter (Signed)
No answer, no voicemail.

## 2017-10-15 NOTE — Telephone Encounter (Signed)
Pt informed

## 2017-11-01 ENCOUNTER — Telehealth: Payer: Self-pay | Admitting: Student in an Organized Health Care Education/Training Program

## 2017-11-01 NOTE — Telephone Encounter (Signed)
Patient would like to know if you would take over his Buspirone.  His PCP at the [facility] has retired.  I told him that he needed to get whoever was covering at the [facility] to get him his script for this month and he could discuss it with you at his next appointment.

## 2017-11-01 NOTE — Telephone Encounter (Signed)
Patient wants to speak with Nurse about a medication he wants dr Holley Raring to take over from the [facility]. Per vmail left at 1:08 Please call

## 2017-11-08 DIAGNOSIS — Z0000 Encounter for general adult medical examination without abnormal findings: Secondary | ICD-10-CM | POA: Diagnosis not present

## 2017-11-08 DIAGNOSIS — M25512 Pain in left shoulder: Secondary | ICD-10-CM | POA: Diagnosis not present

## 2017-11-08 DIAGNOSIS — G4733 Obstructive sleep apnea (adult) (pediatric): Secondary | ICD-10-CM | POA: Diagnosis not present

## 2017-11-08 DIAGNOSIS — I25118 Atherosclerotic heart disease of native coronary artery with other forms of angina pectoris: Secondary | ICD-10-CM | POA: Diagnosis not present

## 2017-11-08 DIAGNOSIS — F325 Major depressive disorder, single episode, in full remission: Secondary | ICD-10-CM | POA: Diagnosis not present

## 2017-11-08 DIAGNOSIS — I10 Essential (primary) hypertension: Secondary | ICD-10-CM | POA: Diagnosis not present

## 2017-11-08 DIAGNOSIS — M25561 Pain in right knee: Secondary | ICD-10-CM | POA: Diagnosis not present

## 2017-11-08 DIAGNOSIS — M19012 Primary osteoarthritis, left shoulder: Secondary | ICD-10-CM | POA: Diagnosis not present

## 2017-11-08 DIAGNOSIS — R739 Hyperglycemia, unspecified: Secondary | ICD-10-CM | POA: Diagnosis not present

## 2017-11-08 DIAGNOSIS — M1711 Unilateral primary osteoarthritis, right knee: Secondary | ICD-10-CM | POA: Diagnosis not present

## 2017-11-08 DIAGNOSIS — E782 Mixed hyperlipidemia: Secondary | ICD-10-CM | POA: Diagnosis not present

## 2017-11-14 ENCOUNTER — Telehealth: Payer: Self-pay | Admitting: Student in an Organized Health Care Education/Training Program
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
## 2017-11-16 DIAGNOSIS — M25512 Pain in left shoulder: Secondary | ICD-10-CM | POA: Diagnosis not present

## 2017-11-16 DIAGNOSIS — M542 Cervicalgia: Secondary | ICD-10-CM | POA: Diagnosis not present

## 2017-11-16 DIAGNOSIS — S4992XA Unspecified injury of left shoulder and upper arm, initial encounter: Secondary | ICD-10-CM | POA: Diagnosis not present

## 2017-11-16 DIAGNOSIS — M19012 Primary osteoarthritis, left shoulder: Secondary | ICD-10-CM | POA: Diagnosis not present

## 2017-11-22 ENCOUNTER — Other Ambulatory Visit: Payer: Self-pay

## 2017-11-22 ENCOUNTER — Ambulatory Visit: Payer: PPO | Admitting: Urology

## 2017-11-22 ENCOUNTER — Encounter: Payer: Self-pay | Admitting: Urology

## 2017-11-22 VITALS — BP 94/57 | HR 101

## 2017-11-22 DIAGNOSIS — N402 Nodular prostate without lower urinary tract symptoms: Secondary | ICD-10-CM

## 2017-11-22 NOTE — Progress Notes (Signed)
66 year old male with an uncorrected PSA of 1.3 and prostate nodule when seen by Larene Beach last month.  He was set up for prostate biopsy today.  He has a long history of BPH and prostatitis.  Exam: Prostate 35 g, smooth.  There is slight increased firmness to the left prostate.  Impression/recommendation: The DRE is not suspicious.  Will see if we can get an MRI approved prior to proceeding with biopsy.

## 2017-11-27 ENCOUNTER — Telehealth: Payer: Self-pay | Admitting: Urology

## 2017-11-27 ENCOUNTER — Other Ambulatory Visit: Payer: Self-pay

## 2017-11-27 ENCOUNTER — Ambulatory Visit
Payer: PPO | Attending: Student in an Organized Health Care Education/Training Program | Admitting: Student in an Organized Health Care Education/Training Program

## 2017-11-27 ENCOUNTER — Encounter: Payer: Self-pay | Admitting: Student in an Organized Health Care Education/Training Program

## 2017-11-27 VITALS — BP 118/69 | HR 91 | Temp 97.2°F | Resp 18 | Ht 68.0 in | Wt 215.0 lb

## 2017-11-27 DIAGNOSIS — M25512 Pain in left shoulder: Secondary | ICD-10-CM | POA: Diagnosis not present

## 2017-11-27 DIAGNOSIS — G4733 Obstructive sleep apnea (adult) (pediatric): Secondary | ICD-10-CM | POA: Insufficient documentation

## 2017-11-27 DIAGNOSIS — G894 Chronic pain syndrome: Secondary | ICD-10-CM

## 2017-11-27 DIAGNOSIS — Z87891 Personal history of nicotine dependence: Secondary | ICD-10-CM | POA: Insufficient documentation

## 2017-11-27 DIAGNOSIS — M79605 Pain in left leg: Secondary | ICD-10-CM | POA: Diagnosis not present

## 2017-11-27 DIAGNOSIS — Z7982 Long term (current) use of aspirin: Secondary | ICD-10-CM | POA: Diagnosis not present

## 2017-11-27 DIAGNOSIS — M48062 Spinal stenosis, lumbar region with neurogenic claudication: Secondary | ICD-10-CM | POA: Insufficient documentation

## 2017-11-27 DIAGNOSIS — E782 Mixed hyperlipidemia: Secondary | ICD-10-CM | POA: Insufficient documentation

## 2017-11-27 DIAGNOSIS — Z79899 Other long term (current) drug therapy: Secondary | ICD-10-CM | POA: Diagnosis not present

## 2017-11-27 DIAGNOSIS — Z5181 Encounter for therapeutic drug level monitoring: Secondary | ICD-10-CM | POA: Insufficient documentation

## 2017-11-27 DIAGNOSIS — M47816 Spondylosis without myelopathy or radiculopathy, lumbar region: Secondary | ICD-10-CM

## 2017-11-27 DIAGNOSIS — M545 Low back pain: Secondary | ICD-10-CM | POA: Insufficient documentation

## 2017-11-27 DIAGNOSIS — M5030 Other cervical disc degeneration, unspecified cervical region: Secondary | ICD-10-CM

## 2017-11-27 DIAGNOSIS — M5136 Other intervertebral disc degeneration, lumbar region: Secondary | ICD-10-CM

## 2017-11-27 DIAGNOSIS — F329 Major depressive disorder, single episode, unspecified: Secondary | ICD-10-CM | POA: Insufficient documentation

## 2017-11-27 DIAGNOSIS — I2510 Atherosclerotic heart disease of native coronary artery without angina pectoris: Secondary | ICD-10-CM | POA: Diagnosis not present

## 2017-11-27 DIAGNOSIS — I10 Essential (primary) hypertension: Secondary | ICD-10-CM | POA: Insufficient documentation

## 2017-11-27 DIAGNOSIS — M79604 Pain in right leg: Secondary | ICD-10-CM | POA: Diagnosis not present

## 2017-11-27 DIAGNOSIS — Z79891 Long term (current) use of opiate analgesic: Secondary | ICD-10-CM

## 2017-11-27 MED ORDER — KETOROLAC TROMETHAMINE 30 MG/ML IJ SOLN
30.0000 mg | Freq: Once | INTRAMUSCULAR | Status: AC
  Administered 2017-11-27: 30 mg via INTRAMUSCULAR
  Filled 2017-11-27: qty 1

## 2017-11-27 MED ORDER — OXYCODONE-ACETAMINOPHEN 10-325 MG PO TABS: 1.0000 | ORAL_TABLET | Freq: Three times a day (TID) | ORAL | 0 refills | Status: DC | PRN

## 2017-11-27 MED ORDER — ORPHENADRINE CITRATE 30 MG/ML IJ SOLN
30.0000 mg | Freq: Once | INTRAMUSCULAR | Status: AC
  Administered 2017-11-27: 30 mg via INTRAMUSCULAR
  Filled 2017-11-27: qty 2

## 2017-11-27 NOTE — Progress Notes (Signed)
Nursing Pain Medication Assessment:  Safety precautions to be maintained throughout the outpatient stay will include: orient to surroundings, keep bed in low position, maintain call bell within reach at all times, provide assistance with transfer out of bed and ambulation.  Medication Inspection Compliance: Pill count conducted under aseptic conditions, in front of the patient. Neither the pills nor the bottle was removed from the patient's sight at any time. Once count was completed pills were immediately returned to the patient in their original bottle.  Medication: Oxycodone/APAP Pill/Patch Count: 8 of 90 pills remain Pill/Patch Appearance: Markings consistent with prescribed medication Bottle Appearance: Standard pharmacy container. Clearly labeled. Filled Date: 07 / 26/ 2019 Last Medication intake:  Today

## 2017-11-27 NOTE — Progress Notes (Signed)
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 Care Physician: Kirk Ruths, MD Location: [facility] Note by: Gillis Santa, M.D Date: 11/27/2017; Time: 3:30 PM  Patient Instructions  You have been given 3 electronic prescriptions for Hydrocodone to last until 02/25/2018.

## 2017-11-27 NOTE — Patient Instructions (Addendum)
You have been given 3 electronic prescriptions for Hydrocodone to last until 02/25/2018.

## 2017-11-27 NOTE — Telephone Encounter (Signed)
Patient is scheduled for a MRI on 12-08-17 will you just call him with results?   Sharyn Lull

## 2017-11-28 ENCOUNTER — Ambulatory Visit: Payer: PPO | Admitting: Urology

## 2017-11-29 NOTE — Telephone Encounter (Signed)
yes

## 2017-12-04 ENCOUNTER — Telehealth: Payer: Self-pay | Admitting: *Deleted

## 2017-12-04 NOTE — Telephone Encounter (Signed)
Called patient to determine if this is a new pain or something that has flared up so that we might better know how to treat him.

## 2017-12-05 ENCOUNTER — Ambulatory Visit
Admission: RE | Admit: 2017-12-05 | Discharge: 2017-12-05 | Disposition: A | Discharge status: Home / Self Care | Payer: PPO | Source: Ambulatory Visit | Attending: Nurse Practitioner | Admitting: Nurse Practitioner

## 2017-12-05 ENCOUNTER — Ambulatory Visit (HOSPITAL_BASED_OUTPATIENT_CLINIC_OR_DEPARTMENT_OTHER): Payer: PPO | Admitting: Nurse Practitioner

## 2017-12-05 ENCOUNTER — Ambulatory Visit
Admission: RE | Admit: 2017-12-05 | Discharge: 2017-12-05 | Disposition: A | Discharge status: Home / Self Care | Payer: No Typology Code available for payment source | Source: Ambulatory Visit | Attending: Nurse Practitioner | Admitting: Nurse Practitioner

## 2017-12-05 ENCOUNTER — Encounter: Payer: Self-pay | Admitting: Nurse Practitioner

## 2017-12-05 ENCOUNTER — Other Ambulatory Visit: Payer: Self-pay

## 2017-12-05 VITALS — BP 141/89 | HR 99 | Temp 98.1°F | Resp 18 | Ht 68.0 in | Wt 215.0 lb

## 2017-12-05 DIAGNOSIS — M47816 Spondylosis without myelopathy or radiculopathy, lumbar region: Secondary | ICD-10-CM | POA: Diagnosis not present

## 2017-12-05 DIAGNOSIS — Z8249 Family history of ischemic heart disease and other diseases of the circulatory system: Secondary | ICD-10-CM | POA: Diagnosis not present

## 2017-12-05 DIAGNOSIS — G4733 Obstructive sleep apnea (adult) (pediatric): Secondary | ICD-10-CM | POA: Insufficient documentation

## 2017-12-05 DIAGNOSIS — I872 Venous insufficiency (chronic) (peripheral): Secondary | ICD-10-CM | POA: Diagnosis not present

## 2017-12-05 DIAGNOSIS — M5030 Other cervical disc degeneration, unspecified cervical region: Secondary | ICD-10-CM | POA: Insufficient documentation

## 2017-12-05 DIAGNOSIS — Z886 Allergy status to analgesic agent status: Secondary | ICD-10-CM | POA: Diagnosis not present

## 2017-12-05 DIAGNOSIS — M5116 Intervertebral disc disorders with radiculopathy, lumbar region: Secondary | ICD-10-CM | POA: Insufficient documentation

## 2017-12-05 DIAGNOSIS — M48062 Spinal stenosis, lumbar region with neurogenic claudication: Secondary | ICD-10-CM

## 2017-12-05 DIAGNOSIS — M533 Sacrococcygeal disorders, not elsewhere classified: Secondary | ICD-10-CM | POA: Diagnosis not present

## 2017-12-05 DIAGNOSIS — E782 Mixed hyperlipidemia: Secondary | ICD-10-CM | POA: Insufficient documentation

## 2017-12-05 DIAGNOSIS — Z9049 Acquired absence of other specified parts of digestive tract: Secondary | ICD-10-CM | POA: Insufficient documentation

## 2017-12-05 DIAGNOSIS — M5442 Lumbago with sciatica, left side: Secondary | ICD-10-CM

## 2017-12-05 DIAGNOSIS — Z888 Allergy status to other drugs, medicaments and biological substances status: Secondary | ICD-10-CM | POA: Diagnosis not present

## 2017-12-05 DIAGNOSIS — M25552 Pain in left hip: Secondary | ICD-10-CM | POA: Diagnosis not present

## 2017-12-05 DIAGNOSIS — F329 Major depressive disorder, single episode, unspecified: Secondary | ICD-10-CM | POA: Insufficient documentation

## 2017-12-05 DIAGNOSIS — Z7982 Long term (current) use of aspirin: Secondary | ICD-10-CM | POA: Diagnosis not present

## 2017-12-05 DIAGNOSIS — G894 Chronic pain syndrome: Secondary | ICD-10-CM

## 2017-12-05 DIAGNOSIS — G8929 Other chronic pain: Secondary | ICD-10-CM

## 2017-12-05 DIAGNOSIS — Z791 Long term (current) use of non-steroidal anti-inflammatories (NSAID): Secondary | ICD-10-CM | POA: Insufficient documentation

## 2017-12-05 DIAGNOSIS — R739 Hyperglycemia, unspecified: Secondary | ICD-10-CM | POA: Insufficient documentation

## 2017-12-05 DIAGNOSIS — I2510 Atherosclerotic heart disease of native coronary artery without angina pectoris: Secondary | ICD-10-CM | POA: Diagnosis not present

## 2017-12-05 DIAGNOSIS — Z79891 Long term (current) use of opiate analgesic: Secondary | ICD-10-CM | POA: Diagnosis not present

## 2017-12-05 DIAGNOSIS — Z87891 Personal history of nicotine dependence: Secondary | ICD-10-CM | POA: Insufficient documentation

## 2017-12-05 DIAGNOSIS — I10 Essential (primary) hypertension: Secondary | ICD-10-CM | POA: Diagnosis not present

## 2017-12-05 DIAGNOSIS — Z79899 Other long term (current) drug therapy: Secondary | ICD-10-CM | POA: Diagnosis not present

## 2017-12-05 MED ORDER — ORPHENADRINE CITRATE 30 MG/ML IJ SOLN: 30.0000 mg | Freq: Once | INTRAMUSCULAR | Status: DC

## 2017-12-05 MED ORDER — KETOROLAC TROMETHAMINE 60 MG/2ML IM SOLN
INTRAMUSCULAR | Status: AC
  Filled 2017-12-05: qty 2

## 2017-12-05 MED ORDER — ORPHENADRINE CITRATE 30 MG/ML IJ SOLN
INTRAMUSCULAR | Status: AC
  Filled 2017-12-05: qty 2

## 2017-12-05 MED ORDER — ORPHENADRINE CITRATE 30 MG/ML IJ SOLN
60.0000 mg | Freq: Once | INTRAMUSCULAR | Status: AC
  Administered 2017-12-05: 60 mg via INTRAMUSCULAR

## 2017-12-05 MED ORDER — KETOROLAC TROMETHAMINE 60 MG/2ML IM SOLN
60.0000 mg | Freq: Once | INTRAMUSCULAR | Status: AC
  Administered 2017-12-05: 60 mg via INTRAMUSCULAR

## 2017-12-05 NOTE — Progress Notes (Signed)
Safety precautions to be maintained throughout the outpatient stay will include: orient to surroundings, keep bed in low position, maintain call bell within reach at all times, provide assistance with transfer out of bed and ambulation.  

## 2017-12-05 NOTE — Progress Notes (Signed)
Patient's Name: Gary [name]  MRN: [id number]  Referring Provider: Anderson, Marshall W, MD  DOB: 07/26/1950  PCP: Anderson, Marshall W, MD  DOS: 12/05/2017  Note by: Gary M. King NP  Service setting: Ambulatory outpatient  Specialty: Interventional Pain Management  Location: [facility] (AMB) Pain Management Facility    Patient type: Established    Primary Reason(s) for Visit: Evaluation of chronic illnesses with exacerbation, or progression (Level of risk: moderate) CC: Hip Pain (left)  HPI  Gary [name] is a 66 y.o. year old, male patient, who comes today for a follow-up evaluation. He has Spondylosis without myelopathy or radiculopathy, lumbar region; Lumbar degenerative disc disease; Degenerative disc disease, cervical; Chronic pain syndrome; Facet arthropathy, lumbar; Benign essential HTN; Chronic hyperglycemia; Osteoarthritis of knee; Coronary artery disease; Health care maintenance; Heel pain; Localized, primary osteoarthritis; Low back pain; Major depression in remission (HCC); Mixed hyperlipidemia; Neck pain; OSA (obstructive sleep apnea); Pain medication agreement signed; Spinal stenosis of lumbar region with neurogenic claudication; Venous insufficiency of both lower extremities; DDD (degenerative disc disease), cervical; Other intervertebral disc degeneration, lumbar region; Chronic bilateral low back pain with left-sided sciatica; Chronic left sacroiliac joint pain; and Chronic hip pain, left on their problem list. Gary [name] was last seen on Visit date not found. His primarily concern today is the Hip Pain (left)  Pain Assessment: Location: Left Hip Radiating: entire left leg to the foot Onset: More than a month ago Duration: Chronic pain Quality: Stabbing Severity: 9 /10 (subjective, self-reported pain score)  Note: Reported level is compatible with observation. Clinically the patient looks like a 3/10 A 3/10 is viewed as "Moderate" and described as significantly interfering with  activities of daily living (ADL). It becomes difficult to feed, bathe, get dressed, get on and off the toilet or to perform personal hygiene functions. Difficult to get in and out of bed or a chair without assistance. Very distracting. With effort, it can be ignored when deeply involved in activities.       When using our objective Pain Scale, levels between 6 and 10/10 are said to belong in an emergency room, as it progressively worsens from a 6/10, described as severely limiting, requiring emergency care not usually available at an outpatient pain management facility. At a 6/10 level, communication becomes difficult and requires great effort. Assistance to reach the emergency department may be required. Facial flushing and profuse sweating along with potentially dangerous increases in heart rate and blood pressure will be evident. Effect on ADL:   Timing: Constant Modifying factors: nothing BP: (!) 141/89  HR: 99  Further details on both, my assessment(s), as well as the proposed treatment plan, please see below.  He states that he felt his pain about 2 days ago.  He admits that he has had something similar in the past.  He is having pain that goes down from his left hip on the outer leg into his ankle.  He has tingling and cramp. He denies any injuries. He admits that he his pain medication is not effective, it takes the edge off however does not relieve the pain.  He was recently treated for left shoulder pain. he has had images with [facility] however they are not available to review in care everywhere  Laboratory Chemistry  Inflammation Markers (CRP: Acute Phase) (ESR: Chronic Phase) No results found for: CRP, ESRSEDRATE, LATICACIDVEN                       Rheumatology   Markers No results found for: RF, ANA, LABURIC, URICUR, LYMEIGGIGMAB, LYMEABIGMQN, HLAB27                      Renal Function Markers No results found for: BUN, CREATININE, BCR, GFRAA, GFRNONAA                            Hepatic Function Markers No results found for: AST, ALT, ALBUMIN, ALKPHOS, HCVAB, AMYLASE, LIPASE, AMMONIA                      Electrolytes No results found for: NA, K, CL, CALCIUM, MG, PHOS                      Neuropathy Markers No results found for: VITAMINB12, FOLATE, HGBA1C, HIV                      Bone Pathology Markers No results found for: VD25OH, [id number], [id number], [id number], 25OHVITD1, 25OHVITD2, 25OHVITD3, TESTOFREE, TESTOSTERONE                       Coagulation Parameters No results found for: INR, LABPROT, APTT, PLT, DDIMER                      Cardiovascular Markers No results found for: BNP, CKTOTAL, CKMB, TROPONINI, HGB, HCT                       CA Markers No results found for: CEA, CA125, LABCA2                      Note: Lab results reviewed.  Recent Diagnostic Imaging Review  Lumbosacral Imaging: Lumbar MR wo contrast:  Results for orders placed during the hospital encounter of 02/05/15  MR Lumbar Spine Wo Contrast   Narrative CLINICAL DATA:  Low back pain extending into both legs. Pain on and off for years.  EXAM: MRI LUMBAR SPINE WITHOUT CONTRAST  TECHNIQUE: Multiplanar, multisequence MR imaging of the lumbar spine was performed. No intravenous contrast was administered.  COMPARISON:  11/23/2010.  FINDINGS: Segmentation: Normal.  Alignment: 3 mm anterolisthesis L4-5. 5 mm retrolisthesis L2-3. These are degenerative/ facet related.  Vertebrae: No worrisome osseous lesion.  Conus medullaris: Normal in size, signal, and location.  Paraspinal tissues: No evidence for hydronephrosis or paravertebral mass.  Disc levels:  L1-L2: Central and leftward protrusion. Mild facet arthropathy. LEFT subarticular zone and foraminal zone narrowing. LEFT L2 nerve root impingement is possible.  L2-L3: 5 mm retrolisthesis. Near-complete loss of interspace height. Osseous spurring predominantly from the inferior aspect of L2 contributes to  severe spinal stenosis. Shallow central protrusion. Superimposed facet arthropathy and ligamentum flavum hypertrophy. BILATERAL L2 and L3 nerve root impingement.  L3-L4: Mild bulge. Asymmetric posterior element hypertrophy on the LEFT. Mild stenosis. Subarticular zone and foraminal zone narrowing is not established.  L4-L5: 3 mm anterolisthesis is facet mediated. There is moderate facet and severe ligamentum flavum hypertrophy. Nodular character to the LEFT ligamentum flavum suggests possible superimposed 6 mm synovial cyst. Severe spinal stenosis. Broad-based central protrusion. LEFT greater than RIGHT subarticular zone and foraminal zone narrowing affecting the L4 and L5 nerve root impingement.  L5-S1:  Mild bulge.  Moderate facet arthropathy.  No impingement.  Unremarkable SI joints and visualized pelvis.  Compared with 2012, there is progression  of disease at L2-3 and L4-5.  IMPRESSION: Severe spinal stenosis at L2-3 is related to retrolisthesis, osseous spurring, disc material, and posterior element hypertrophy. BILATERAL L2 and L3 nerve root impingement.  Severe spinal stenosis is also present at L4-5, with 3 mm of anterolisthesis, posterior element hypertrophy, disc material, possible 6 mm LEFT synovial cyst contribute to LEFT greater than RIGHT L4 and L5 nerve root impingement.  Multilevel spondylosis elsewhere, with possible subarticular zone narrowing at L1-2 on the LEFT. Disc disease.   Electronically Signed   By: John T Curnes M.D.   On: 02/05/2015 15:28   Complexity Note: Imaging results reviewed. Results shared with Mr. Amory, using Layman's terms.                         Meds   Current Outpatient Medications:  .  acetaminophen (TYLENOL) 500 MG tablet, Take as needed by mouth. , Disp: , Rfl:  .  Albuterol Sulfate 108 (90 Base) MCG/ACT AEPB, Inhale as needed into the lungs. , Disp: , Rfl:  .  aspirin 81 MG tablet, Take by mouth., Disp: , Rfl:  .   buPROPion (WELLBUTRIN XL) 300 MG 24 hr tablet, Take by mouth., Disp: , Rfl:  .  busPIRone (BUSPAR) 15 MG tablet, Take by mouth., Disp: , Rfl:  .  cholecalciferol (VITAMIN D) 1000 units tablet, Take by mouth., Disp: , Rfl:  .  DULoxetine (CYMBALTA) 60 MG capsule, Take 1 capsule (60 mg total) by mouth daily., Disp: 30 capsule, Rfl: 4 .  esomeprazole (NEXIUM) 40 MG capsule, Take by mouth., Disp: , Rfl:  .  ezetimibe (ZETIA) 10 MG tablet, , Disp: , Rfl:  .  fenofibrate (TRICOR) 48 MG tablet, Take 48 mg daily by mouth. , Disp: , Rfl:  .  finasteride (PROSCAR) 5 MG tablet, Take 5 mg daily by mouth. , Disp: , Rfl:  .  gabapentin (NEURONTIN) 300 MG capsule, Take 2 capsules (600 mg total) by mouth at bedtime., Disp: 60 capsule, Rfl: 4 .  hydrochlorothiazide (HYDRODIURIL) 25 MG tablet, , Disp: , Rfl:  .  meloxicam (MOBIC) 15 MG tablet, Take 15 mg by mouth daily., Disp: , Rfl:  .  methocarbamol (ROBAXIN) 500 MG tablet, Take 500 mg by mouth 2 (two) times daily as needed for muscle spasms., Disp: , Rfl:  .  niacin-lovastatin (ADVICOR) 1000-20 MG 24 hr tablet, Take by mouth., Disp: , Rfl:  .  oxyCODONE-acetaminophen (PERCOCET) 10-325 MG tablet, Take 1 tablet by mouth every 8 (eight) hours as needed for pain., Disp: 90 tablet, Rfl: 0 .  [START ON 12/28/2017] oxyCODONE-acetaminophen (PERCOCET) 10-325 MG tablet, Take 1 tablet by mouth every 8 (eight) hours as needed for pain., Disp: 90 tablet, Rfl: 0 .  [START ON 01/26/2018] oxyCODONE-acetaminophen (PERCOCET) 10-325 MG tablet, Take 1 tablet by mouth every 8 (eight) hours as needed for pain., Disp: 90 tablet, Rfl: 0 .  pravastatin (PRAVACHOL) 20 MG tablet, Take by mouth., Disp: , Rfl:  .  sildenafil (REVATIO) 20 MG tablet, Take 3 to 5 tablets two hours before intercouse on an empty stomach.  Do not take with nitrates., Disp: 50 tablet, Rfl: 3  ROS  Constitutional: Denies any fever or chills Gastrointestinal: No reported hemesis, hematochezia, vomiting, or acute GI  distress Musculoskeletal: Denies any acute onset joint swelling, redness, loss of ROM, or weakness Neurological: No reported episodes of acute onset apraxia, aphasia, dysarthria, agnosia, amnesia, paralysis, loss of coordination, or loss of consciousness  Allergies    Mr. Schiro is allergic to niacin-lovastatin er; atorvastatin; simvastatin; and propoxyphene.  PFSH  Drug: Mr. Farooq  reports that he does not use drugs. Alcohol:  reports that he does not drink alcohol. Tobacco:  reports that he quit smoking about 5 years ago. He has never used smokeless tobacco. Medical:  has a past medical history of Benign essential HTN (06/24/2015), Chronic hyperglycemia (04/13/2014), Coronary artery disease (02/16/2014), Degenerative disc disease, cervical (01/22/2017), Depression, Heel pain (10/10/2017), Lumbar degenerative disc disease (01/22/2017), Major depression in remission (HCC) (03/10/2014), Mixed hyperlipidemia (02/16/2014), Neck pain (01/24/2017), OSA (obstructive sleep apnea) (07/05/2016), Osteoarthritis of knee (08/12/2014), Spinal stenosis of lumbar region with neurogenic claudication (04/18/2016), Spondylosis without myelopathy or radiculopathy, lumbar region (01/22/2017), and Venous insufficiency of both lower extremities (11/07/2016). Surgical: Mr. Hulsey  has a past surgical history that includes Cholecystectomy; Knee surgery (Left); and deviated septum repair. Family: family history includes Heart disease in his mother.  Constitutional Exam  General appearance: Well nourished, well developed, and well hydrated. In no apparent acute distress Vitals:   12/05/17 1410  BP: (!) 141/89  Pulse: 99  Resp: 18  Temp: 98.1 F (36.7 C)  TempSrc: Oral  SpO2: 96%  Weight: 215 lb (97.5 kg)  Height: 5' 8" (1.727 m)   BMI Assessment: Estimated body mass index is 32.69 kg/m as calculated from the following:   Height as of this encounter: 5' 8" (1.727 m).   Weight as of this encounter: 215 lb (97.5  kg). Psych/Mental status: Alert, oriented x 3 (person, place, & time)       Eyes: PERLA Respiratory: No evidence of acute respiratory distress  Lumbar Spine Area Exam  Skin & Axial Inspection: No masses, redness, or swelling Alignment: Symmetrical Functional ROM: Adequate ROM       Stability: No instability detected Muscle Tone/Strength: Functionally intact. No obvious neuro-muscular anomalies detected. Sensory (Neurological): Unimpaired Palpation: Tender       Provocative Tests: Hyperextension/rotation test: deferred today       Lumbar quadrant test (Kemp's test): deferred today       Lateral bending test: deferred today       Patrick's Maneuver: Non-diagnostic                  Complained of lower abdominal and groin pain FABER test: deferred today                   S-I anterior distraction/compression test: deferred today         S-I lateral compression test: deferred today         S-I Thigh-thrust test: deferred today         S-I Gaenslen's test: deferred today          Gait & Posture Assessment  Ambulation: Unassisted Gait: Relatively normal for age and body habitus Posture: WNL   Lower Extremity Exam    Side: Right lower extremity  Side: Left lower extremity  Stability: No instability observed          Stability: No instability observed          Skin & Extremity Inspection: Skin color, temperature, and hair growth are WNL. No peripheral edema or cyanosis. No masses, redness, swelling, asymmetry, or associated skin lesions. No contractures.  Skin & Extremity Inspection: Evidence of prior arthroplastic surgery  Functional ROM: Adequate ROM                  Functional ROM: Adequate ROM                    Muscle Tone/Strength: Functionally intact. No obvious neuro-muscular anomalies detected.  Muscle Tone/Strength: Functionally intact. No obvious neuro-muscular anomalies detected.  Sensory (Neurological): Unimpaired  Sensory (Neurological): Unimpaired  Palpation: No palpable  anomalies  Palpation: No palpable anomalies   Assessment  Primary Diagnosis & Pertinent Problem List: The primary encounter diagnosis was Chronic bilateral low back pain with left-sided sciatica. Diagnoses of Spinal stenosis of lumbar region with neurogenic claudication, Chronic left sacroiliac joint pain, Chronic hip pain, left, and Chronic pain syndrome were also pertinent to this visit.  Status Diagnosis  Worsening Worsening Worsening 1. Chronic bilateral low back pain with left-sided sciatica   2. Spinal stenosis of lumbar region with neurogenic claudication   3. Chronic left sacroiliac joint pain   4. Chronic hip pain, left   5. Chronic pain syndrome     Problems updated and reviewed during this visit: Problem  Chronic Bilateral Low Back Pain With Left-Sided Sciatica  Chronic Left Sacroiliac Joint Pain  Chronic Hip Pain, Left  Chronic Pain Syndrome  Ddd (Degenerative Disc Disease), Cervical  Other Intervertebral Disc Degeneration, Lumbar Region  Health Care Maintenance   Overview:  Had a colonoscopy 06. Pneumovax 2014. Psa's here, Dr Matilde Sprang and due for exam. Walking some. Has a living will, no living will. Full code. HCV ordered for 5-17, declines flu  Overview:  Overview:  Had a colonoscopy 06. Pneumovax 2014. Psa's here, Dr Matilde Sprang and due for exam. Walking some. Has a living will, no living will. Full code. HCV ordered for 5-17, declines flu   Formatting of this note might be different from the original. Had a colonoscopy 06, reffered back 2019. Pneumovax 2014, prevnar 13 in 2018. Psa's here, Dr Matilde Sprang and due for exam. Walking some. Has a living will, no living will. Full code. HCV ordered for 5-17, declines flu, shingrix 8-19  MEDICARE WELLNESS VISIT  PROVIDERS RENDERING CARE Dr. Ouida Sills, [facility], [place] concomitantly   FUNCTIONAL ASSESSMENT  (1) Hearing: Demonstrates normal hearing in conversation.  (2) Risk of Falls: No reports of falls or abnormal balance. Gait is  observed to be good upon observation.  (3) Home Safety; Home is safe and secure (4) Activities of Daily Living; Household chores and grooming are managed without problems. Personal finances are managed without problems.   DEPRESSION SCREENING There does not seem to be loss of interest in activities nor excess crying or changes in sleep or appetite.   COGNITIVE SCREENING Orientation is appropriate as are responses to questions and general conversation. No reports of forgetfulness or losing things.   PREVENTION PLAN Cardiovascular: Cholesterol follow closely  Diabetes: Yearly glucose Colon Cancer: 06 and referred back 2019 again Glaucoma: Eye exam yearly  Pneumonia: Pneumovax 2014 and prevnar 13 in 8-19 Shingles: History of zostavax and shingrix 8-19 Influenza: Yearly Smoking Cessation: NA  OTHER PERSONALIZED HEALTH ADVISE More exercise and vegetable based diet  [place] Full Code    [name] MD   Chronic Hyperglycemia   Last Assessment & Plan:  Glucose is controlled and followed on his diet.  Overview:  Last Assessment & Plan:  Glucose is controlled   Last Assessment & Plan:  Glucose is controlled   Coronary Artery Disease   Overview:  Minimal disease by cath 12/13  Last Assessment & Plan:  Seems to be tolerating medical regimen without significant side effects and symptoms such as worsening chest pain are not noted.   Overview:  Overview:  Minimal disease by cath 12/13  Last Assessment &  Plan:  Seems to be tolerating medical regimen without significant side effects and symptoms such as worsening chest pain are not noted.    Minimal disease by cath 12/13  Last Assessment & Plan:  Exertional chest pain is not present and patient is tolerating the prescribed medical regimen.   Mixed Hyperlipidemia   Last Assessment & Plan:  Diet for healthy cholesterol is being attempted and no clear myalgia's or other side effects are noted.    Overview:  Last Assessment & Plan:  Low fat diet is being attempted and no significant side effects such as myalgia's are noted.    Last Assessment & Plan:  Healthy fat diet is being followed and no myalgia's are noted.    Plan of Care  Pharmacotherapy (Medications Ordered): Meds ordered this encounter  Medications  . DISCONTD: orphenadrine (NORFLEX) injection 30 mg  . orphenadrine (NORFLEX) injection 60 mg  . ketorolac (TORADOL) injection 60 mg   New Prescriptions   No medications on file   Medications administered today: We administered orphenadrine and ketorolac. Lab-work, procedure(s), and/or referral(s): Orders Placed This Encounter  Procedures  . Lumbar Transforaminal Epidural  . DG HIP UNILAT [name] OR [name]/O PELVIS 2-3 VIEWS LEFT  . DG Si Joints  . DG Lumbar Spine Complete [name]/Bend   Imaging and/or referral(s): None  Interventional therapies: Planned, scheduled, and/or pending:   Left-sided lumbar transforaminal epidural steroid injection additional procedures pending above images    Provider-requested follow-up: Return for Appointment As Scheduled, Procedure([name]/Sedation), [name]/ Dr. Lateef.  Future Appointments  Date Time Provider Department Center  12/08/2017 11:50 AM GI-315 MR 3 [id number] GI-315 [name]. [facility]  12/19/2017  8:00 AM Lateef, Bilal, MD [facility] None  02/25/2018 12:30 PM Lateef, Bilal, MD [facility] None   Primary Care Physician: Anderson, Marshall W, MD Location: [facility] Note by: Gary M. King NP Date: 12/05/2017; Time: 3:39 PM  Pain Score Disclaimer: We use the NRS-11 scale. This is a self-reported, subjective measurement of pain severity with only modest accuracy. It is used primarily to identify changes within a particular patient. It must be understood that outpatient pain scales are significantly less accurate that those used for research, where they can be applied under ideal controlled circumstances with minimal exposure to  variables. In reality, the score is likely to be a combination of pain intensity and pain affect, where pain affect describes the degree of emotional arousal or changes in action readiness caused by the sensory experience of pain. Factors such as social and work situation, setting, emotional state, anxiety levels, expectation, and prior pain experience may influence pain perception and show large inter-individual differences that may also be affected by time variables.  Patient instructions provided during this appointment: Patient Instructions   ____________________________________________________________________________________________  Pain Scale  Introduction: The pain score used by this practice is the Verbal Numerical Rating Scale (VNRS-11). This is an 11-point scale. It is for adults and children 10 years or older. There are significant differences in how the pain score is reported, used, and applied. Forget everything you learned in the past and learn this scoring system.  General Information: The scale should reflect your current level of pain. Unless you are specifically asked for the level of your worst pain, or your average pain. If you are asked for one of these two, then it should be understood that it is over the past 24 hours.  Basic Activities of Daily Living (ADL): Personal hygiene, dressing, eating, transferring, and using restroom.  Instructions:   Most patients tend to report their level of pain as a combination of two factors, their physical pain and their psychosocial pain. This last one is also known as "suffering" and it is reflection of how physical pain affects you socially and psychologically. From now on, report them separately. From this point on, when asked to report your pain level, report only your physical pain. Use the following table for reference.  Pain Clinic Pain Levels (0-5/10)  Pain Level Score  Description  No Pain 0   Mild pain 1 Nagging, annoying, but  does not interfere with basic activities of daily living (ADL). Patients are able to eat, bathe, get dressed, toileting (being able to get on and off the toilet and perform personal hygiene functions), transfer (move in and out of bed or a chair without assistance), and maintain continence (able to control bladder and bowel functions). Blood pressure and heart rate are unaffected. A normal heart rate for a healthy adult ranges from 60 to 100 bpm (beats per minute).   Mild to moderate pain 2 Noticeable and distracting. Impossible to hide from other people. More frequent flare-ups. Still possible to adapt and function close to normal. It can be very annoying and may have occasional stronger flare-ups. With discipline, patients may get used to it and adapt.   Moderate pain 3 Interferes significantly with activities of daily living (ADL). It becomes difficult to feed, bathe, get dressed, get on and off the toilet or to perform personal hygiene functions. Difficult to get in and out of bed or a chair without assistance. Very distracting. With effort, it can be ignored when deeply involved in activities.   Moderately severe pain 4 Impossible to ignore for more than a few minutes. With effort, patients may still be able to manage work or participate in some social activities. Very difficult to concentrate. Signs of autonomic nervous system discharge are evident: dilated pupils (mydriasis); mild sweating (diaphoresis); sleep interference. Heart rate becomes elevated (>115 bpm). Diastolic blood pressure (lower number) rises above 100 mmHg. Patients find relief in laying down and not moving.   Severe pain 5 Intense and extremely unpleasant. Associated with frowning face and frequent crying. Pain overwhelms the senses.  Ability to do any activity or maintain social relationships becomes significantly limited. Conversation becomes difficult. Pacing back and forth is common, as getting into a comfortable position is  nearly impossible. Pain wakes you up from deep sleep. Physical signs will be obvious: pupillary dilation; increased sweating; goosebumps; brisk reflexes; cold, clammy hands and feet; nausea, vomiting or dry heaves; loss of appetite; significant sleep disturbance with inability to fall asleep or to remain asleep. When persistent, significant weight loss is observed due to the complete loss of appetite and sleep deprivation.  Blood pressure and heart rate becomes significantly elevated. Caution: If elevated blood pressure triggers a pounding headache associated with blurred vision, then the patient should immediately seek attention at an urgent or emergency care unit, as these may be signs of an impending stroke.    Emergency Department Pain Levels (6-10/10)  Emergency Room Pain 6 Severely limiting. Requires emergency care and should not be seen or managed at an outpatient pain management facility. Communication becomes difficult and requires great effort. Assistance to reach the emergency department may be required. Facial flushing and profuse sweating along with potentially dangerous increases in heart rate and blood pressure will be evident.   Distressing pain 7 Self-care is very difficult. Assistance is required to transport, or use restroom. Assistance   to reach the emergency department will be required. Tasks requiring coordination, such as bathing and getting dressed become very difficult.   Disabling pain 8 Self-care is no longer possible. At this level, pain is disabling. The individual is unable to do even the most "basic" activities such as walking, eating, bathing, dressing, transferring to a bed, or toileting. Fine motor skills are lost. It is difficult to think clearly.   Incapacitating pain 9 Pain becomes incapacitating. Thought processing is no longer possible. Difficult to remember your own name. Control of movement and coordination are lost.   The worst pain imaginable 10 At this level,  most patients pass out from pain. When this level is reached, collapse of the autonomic nervous system occurs, leading to a sudden drop in blood pressure and heart rate. This in turn results in a temporary and dramatic drop in blood flow to the brain, leading to a loss of consciousness. Fainting is one of the body's self defense mechanisms. Passing out puts the brain in a calmed state and causes it to shut down for a while, in order to begin the healing process.    Summary: 1. Refer to this scale when providing [place] with your pain level. 2. Be accurate and careful when reporting your pain level. This will help with your care. 3. Over-reporting your pain level will lead to loss of credibility. 4. Even a level of 1/10 means that there is pain and will be treated at our facility. 5. High, inaccurate reporting will be documented as "Symptom Exaggeration", leading to loss of credibility and suspicions of possible secondary gains such as obtaining more narcotics, or wanting to appear disabled, for fraudulent reasons. 6. Only pain levels of 5 or below will be seen at our facility. 7. Pain levels of 6 and above will be sent to the Emergency Department and the appointment cancelled. ____________________________________________________________________________________________   ____________________________________________________________________________________________  Preparing for Procedure with Sedation  Instructions: . Oral Intake: Do not eat or drink anything for at least 8 hours prior to your procedure. . Transportation: Public transportation is not allowed. Bring an adult driver. The driver must be physically present in our waiting room before any procedure can be started. Marland Kitchen Physical Assistance: Bring an adult physically capable of assisting you, in the event you need help. This adult should keep you company at home for at least 6 hours after the procedure. . Blood Pressure Medicine: Take your blood  pressure medicine with a sip of water the morning of the procedure. . Blood thinners: Notify our staff if you are taking any blood thinners. Depending on which one you take, there will be specific instructions on how and when to stop it. . Diabetics on insulin: Notify the staff so that you can be scheduled 1st case in the morning. If your diabetes requires high dose insulin, take only  of your normal insulin dose the morning of the procedure and notify the staff that you have done so. . Preventing infections: Shower with an antibacterial soap the morning of your procedure. . Build-up your immune system: Take 1000 mg of Vitamin C with every meal (3 times a day) the day prior to your procedure. Marland Kitchen Antibiotics: Inform the staff if you have a condition or reason that requires you to take antibiotics before dental procedures. . Pregnancy: If you are pregnant, call and cancel the procedure. . Sickness: If you have a cold, fever, or any active infections, call and cancel the procedure. . Arrival: You must be in  the facility at least 30 minutes prior to your scheduled procedure. . Children: Do not bring children with you. . Dress appropriately: Bring dark clothing that you would not mind if they get stained. . Valuables: Do not bring any jewelry or valuables.  Procedure appointments are reserved for interventional treatments only. . No Prescription Refills. . No medication changes will be discussed during procedure appointments. . No disability issues will be discussed.  Reasons to call and reschedule or cancel your procedure: (Following these recommendations will minimize the risk of a serious complication.) . Surgeries: Avoid having procedures within 2 weeks of any surgery. (Avoid for 2 weeks before or after any surgery). . Flu Shots: Avoid having procedures within 2 weeks of a flu shots or . (Avoid for 2 weeks before or after immunizations). . Barium: Avoid having a procedure within 7-10 days after  having had a radiological study involving the use of radiological contrast. (Myelograms, Barium swallow or enema study). . Heart attacks: Avoid any elective procedures or surgeries for the initial 6 months after a "Myocardial Infarction" (Heart Attack). . Blood thinners: It is imperative that you stop these medications before procedures. Let us know if you if you take any blood thinner.  . Infection: Avoid procedures during or within two weeks of an infection (including chest colds or gastrointestinal problems). Symptoms associated with infections include: Localized redness, fever, chills, night sweats or profuse sweating, burning sensation when voiding, cough, congestion, stuffiness, runny nose, sore throat, diarrhea, nausea, vomiting, cold or Flu symptoms, recent or current infections. It is specially important if the infection is over the area that we intend to treat. . Heart and lung problems: Symptoms that may suggest an active cardiopulmonary problem include: cough, chest pain, breathing difficulties or shortness of breath, dizziness, ankle swelling, uncontrolled high or unusually low blood pressure, and/or palpitations. If you are experiencing any of these symptoms, cancel your procedure and contact your primary care physician for an evaluation.  Remember:  Regular Business hours are:  Monday to Thursday 8:00 AM to 4:00 PM  Provider's Schedule: Francisco Naveira, MD:  Procedure days: Tuesday and Thursday 7:30 AM to 4:00 PM  Bilal Lateef, MD:  Procedure days: Monday and Wednesday 7:30 AM to 4:00 PM ____________________________________________________________________________________________       

## 2017-12-05 NOTE — Telephone Encounter (Signed)
Patient states this is a new problem and has never happened before in his life.  Since this is a new problem I told suggested that he come in for evaluation and that [facility] does have time today for that.  Patient will come at 1400 for evaluation.

## 2017-12-05 NOTE — Telephone Encounter (Signed)
Patient called back and gave Phone # [phone] for Cecille Rubin to call

## 2017-12-05 NOTE — Patient Instructions (Addendum)
____________________________________________________________________________________________  Pain Scale  Introduction: The pain score used by this practice is the Verbal Numerical Rating Scale (VNRS-11). This is an 11-point scale. It is for adults and children 10 years or older. There are significant differences in how the pain score is reported, used, and applied. Forget everything you learned in the past and learn this scoring system.  General Information: The scale should reflect your current level of pain. Unless you are specifically asked for the level of your worst pain, or your average pain. If you are asked for one of these two, then it should be understood that it is over the past 24 hours.  Basic Activities of Daily Living (ADL): Personal hygiene, dressing, eating, transferring, and using restroom.  Instructions: Most patients tend to report their level of pain as a combination of two factors, their physical pain and their psychosocial pain. This last one is also known as "suffering" and it is reflection of how physical pain affects you socially and psychologically. From now on, report them separately. From this point on, when asked to report your pain level, report only your physical pain. Use the following table for reference.  Pain Clinic Pain Levels (0-5/10)  Pain Level Score  Description  No Pain 0   Mild pain 1 Nagging, annoying, but does not interfere with basic activities of daily living (ADL). Patients are able to eat, bathe, get dressed, toileting (being able to get on and off the toilet and perform personal hygiene functions), transfer (move in and out of bed or a chair without assistance), and maintain continence (able to control bladder and bowel functions). Blood pressure and heart rate are unaffected. A normal heart rate for a healthy adult ranges from 60 to 100 bpm (beats per minute).   Mild to moderate pain 2 Noticeable and distracting. Impossible to hide from other  people. More frequent flare-ups. Still possible to adapt and function close to normal. It can be very annoying and may have occasional stronger flare-ups. With discipline, patients may get used to it and adapt.   Moderate pain 3 Interferes significantly with activities of daily living (ADL). It becomes difficult to feed, bathe, get dressed, get on and off the toilet or to perform personal hygiene functions. Difficult to get in and out of bed or a chair without assistance. Very distracting. With effort, it can be ignored when deeply involved in activities.   Moderately severe pain 4 Impossible to ignore for more than a few minutes. With effort, patients may still be able to manage work or participate in some social activities. Very difficult to concentrate. Signs of autonomic nervous system discharge are evident: dilated pupils (mydriasis); mild sweating (diaphoresis); sleep interference. Heart rate becomes elevated (>115 bpm). Diastolic blood pressure (lower number) rises above 100 mmHg. Patients find relief in laying down and not moving.   Severe pain 5 Intense and extremely unpleasant. Associated with frowning face and frequent crying. Pain overwhelms the senses.  Ability to do any activity or maintain social relationships becomes significantly limited. Conversation becomes difficult. Pacing back and forth is common, as getting into a comfortable position is nearly impossible. Pain wakes you up from deep sleep. Physical signs will be obvious: pupillary dilation; increased sweating; goosebumps; brisk reflexes; cold, clammy hands and feet; nausea, vomiting or dry heaves; loss of appetite; significant sleep disturbance with inability to fall asleep or to remain asleep. When persistent, significant weight loss is observed due to the complete loss of appetite and sleep deprivation.  Blood   pressure and heart rate becomes significantly elevated. Caution: If elevated blood pressure triggers a pounding headache  associated with blurred vision, then the patient should immediately seek attention at an urgent or emergency care unit, as these may be signs of an impending stroke.    Emergency Department Pain Levels (6-10/10)  Emergency Room Pain 6 Severely limiting. Requires emergency care and should not be seen or managed at an outpatient pain management facility. Communication becomes difficult and requires great effort. Assistance to reach the emergency department may be required. Facial flushing and profuse sweating along with potentially dangerous increases in heart rate and blood pressure will be evident.   Distressing pain 7 Self-care is very difficult. Assistance is required to transport, or use restroom. Assistance to reach the emergency department will be required. Tasks requiring coordination, such as bathing and getting dressed become very difficult.   Disabling pain 8 Self-care is no longer possible. At this level, pain is disabling. The individual is unable to do even the most "basic" activities such as walking, eating, bathing, dressing, transferring to a bed, or toileting. Fine motor skills are lost. It is difficult to think clearly.   Incapacitating pain 9 Pain becomes incapacitating. Thought processing is no longer possible. Difficult to remember your own name. Control of movement and coordination are lost.   The worst pain imaginable 10 At this level, most patients pass out from pain. When this level is reached, collapse of the autonomic nervous system occurs, leading to a sudden drop in blood pressure and heart rate. This in turn results in a temporary and dramatic drop in blood flow to the brain, leading to a loss of consciousness. Fainting is one of the body's self defense mechanisms. Passing out puts the brain in a calmed state and causes it to shut down for a while, in order to begin the healing process.    Summary: 1. Refer to this scale when providing us with your pain level. 2. Be  accurate and careful when reporting your pain level. This will help with your care. 3. Over-reporting your pain level will lead to loss of credibility. 4. Even a level of 1/10 means that there is pain and will be treated at our facility. 5. High, inaccurate reporting will be documented as "Symptom Exaggeration", leading to loss of credibility and suspicions of possible secondary gains such as obtaining more narcotics, or wanting to appear disabled, for fraudulent reasons. 6. Only pain levels of 5 or below will be seen at our facility. 7. Pain levels of 6 and above will be sent to the Emergency Department and the appointment cancelled. ____________________________________________________________________________________________   ____________________________________________________________________________________________  Preparing for Procedure with Sedation  Instructions: . Oral Intake: Do not eat or drink anything for at least 8 hours prior to your procedure. . Transportation: Public transportation is not allowed. Bring an adult driver. The driver must be physically present in our waiting room before any procedure can be started. . Physical Assistance: Bring an adult physically capable of assisting you, in the event you need help. This adult should keep you company at home for at least 6 hours after the procedure. . Blood Pressure Medicine: Take your blood pressure medicine with a sip of water the morning of the procedure. . Blood thinners: Notify our staff if you are taking any blood thinners. Depending on which one you take, there will be specific instructions on how and when to stop it. . Diabetics on insulin: Notify the staff so that you can be   scheduled 1st case in the morning. If your diabetes requires high dose insulin, take only  of your normal insulin dose the morning of the procedure and notify the staff that you have done so. . Preventing infections: Shower with an antibacterial soap  the morning of your procedure. . Build-up your immune system: Take 1000 mg of Vitamin C with every meal (3 times a day) the day prior to your procedure. . Antibiotics: Inform the staff if you have a condition or reason that requires you to take antibiotics before dental procedures. . Pregnancy: If you are pregnant, call and cancel the procedure. . Sickness: If you have a cold, fever, or any active infections, call and cancel the procedure. . Arrival: You must be in the facility at least 30 minutes prior to your scheduled procedure. . Children: Do not bring children with you. . Dress appropriately: Bring dark clothing that you would not mind if they get stained. . Valuables: Do not bring any jewelry or valuables.  Procedure appointments are reserved for interventional treatments only. . No Prescription Refills. . No medication changes will be discussed during procedure appointments. . No disability issues will be discussed.  Reasons to call and reschedule or cancel your procedure: (Following these recommendations will minimize the risk of a serious complication.) . Surgeries: Avoid having procedures within 2 weeks of any surgery. (Avoid for 2 weeks before or after any surgery). . Flu Shots: Avoid having procedures within 2 weeks of a flu shots or . (Avoid for 2 weeks before or after immunizations). . Barium: Avoid having a procedure within 7-10 days after having had a radiological study involving the use of radiological contrast. (Myelograms, Barium swallow or enema study). . Heart attacks: Avoid any elective procedures or surgeries for the initial 6 months after a "Myocardial Infarction" (Heart Attack). . Blood thinners: It is imperative that you stop these medications before procedures. Let us know if you if you take any blood thinner.  . Infection: Avoid procedures during or within two weeks of an infection (including chest colds or gastrointestinal problems). Symptoms associated with  infections include: Localized redness, fever, chills, night sweats or profuse sweating, burning sensation when voiding, cough, congestion, stuffiness, runny nose, sore throat, diarrhea, nausea, vomiting, cold or Flu symptoms, recent or current infections. It is specially important if the infection is over the area that we intend to treat. . Heart and lung problems: Symptoms that may suggest an active cardiopulmonary problem include: cough, chest pain, breathing difficulties or shortness of breath, dizziness, ankle swelling, uncontrolled high or unusually low blood pressure, and/or palpitations. If you are experiencing any of these symptoms, cancel your procedure and contact your primary care physician for an evaluation.  Remember:  Regular Business hours are:  Monday to Thursday 8:00 AM to 4:00 PM  Provider's Schedule: Francisco Naveira, MD:  Procedure days: Tuesday and Thursday 7:30 AM to 4:00 PM  Bilal Lateef, MD:  Procedure days: Monday and Wednesday 7:30 AM to 4:00 PM ____________________________________________________________________________________________    

## 2017-12-06 NOTE — Progress Notes (Signed)
Results were reviewed and found to be: abnormal  No acute injury or pathology identified  Review would suggest interventional pain management techniques may be of benefit

## 2017-12-07 ENCOUNTER — Telehealth: Payer: Self-pay | Admitting: Nurse Practitioner

## 2017-12-07 NOTE — Telephone Encounter (Signed)
Xray results read to patient.  Informed patient that I could only read the results, not interpret them.  After reading results, patient states that he would like me to send a note to Dr Holley Raring and let him know that he would like to know what is causing his pain, because he was having terrible pain and nothing is helping.  Informed patient that I would pas this information to Dr Holley Raring.

## 2017-12-07 NOTE — Telephone Encounter (Signed)
Wants to know results of xrays

## 2017-12-08 ENCOUNTER — Ambulatory Visit
Admission: RE | Admit: 2017-12-08 | Discharge: 2017-12-08 | Disposition: A | Discharge status: Home / Self Care | Payer: No Typology Code available for payment source | Source: Ambulatory Visit | Attending: Urology | Admitting: Urology

## 2017-12-08 DIAGNOSIS — N402 Nodular prostate without lower urinary tract symptoms: Secondary | ICD-10-CM

## 2017-12-08 MED ORDER — GADOBENATE DIMEGLUMINE 529 MG/ML IV SOLN
20.0000 mL | Freq: Once | INTRAVENOUS | Status: AC | PRN
  Administered 2017-12-08: 20 mL via INTRAVENOUS

## 2017-12-11 DIAGNOSIS — Z86010 Personal history of colonic polyps: Secondary | ICD-10-CM | POA: Diagnosis not present

## 2017-12-11 DIAGNOSIS — M25512 Pain in left shoulder: Secondary | ICD-10-CM | POA: Diagnosis not present

## 2017-12-11 DIAGNOSIS — M19012 Primary osteoarthritis, left shoulder: Secondary | ICD-10-CM | POA: Diagnosis not present

## 2017-12-11 DIAGNOSIS — M5030 Other cervical disc degeneration, unspecified cervical region: Secondary | ICD-10-CM | POA: Diagnosis not present

## 2017-12-11 DIAGNOSIS — Z8719 Personal history of other diseases of the digestive system: Secondary | ICD-10-CM | POA: Diagnosis not present

## 2017-12-11 DIAGNOSIS — M542 Cervicalgia: Secondary | ICD-10-CM | POA: Diagnosis not present

## 2017-12-11 DIAGNOSIS — M47812 Spondylosis without myelopathy or radiculopathy, cervical region: Secondary | ICD-10-CM | POA: Diagnosis not present

## 2017-12-11 DIAGNOSIS — S4992XD Unspecified injury of left shoulder and upper arm, subsequent encounter: Secondary | ICD-10-CM | POA: Diagnosis not present

## 2017-12-14 ENCOUNTER — Telehealth: Payer: Self-pay | Admitting: Urology

## 2017-12-14 NOTE — Telephone Encounter (Signed)
Patient called & would like to have his results to the MRI he had done 12/08/17. Please advise patient on his cell.

## 2017-12-17 ENCOUNTER — Telehealth: Payer: Self-pay | Admitting: *Deleted

## 2017-12-17 NOTE — Telephone Encounter (Signed)
Patient contacted-left voicemail to call back

## 2017-12-18 NOTE — Telephone Encounter (Signed)
MRI results were reviewed with patient.  He has an indeterminate lesion in the left prostate.  Fusion biopsy was recommended and he desires to proceed.   Please schedule fusion biopsy at [facility]

## 2017-12-18 NOTE — Telephone Encounter (Signed)
Patient asking what procedure he is having tomorrow, and what is the prep. I answered these questions.

## 2017-12-19 ENCOUNTER — Encounter: Payer: Self-pay | Admitting: Student in an Organized Health Care Education/Training Program

## 2017-12-19 ENCOUNTER — Other Ambulatory Visit: Payer: Self-pay

## 2017-12-19 ENCOUNTER — Ambulatory Visit (HOSPITAL_BASED_OUTPATIENT_CLINIC_OR_DEPARTMENT_OTHER): Payer: PPO | Admitting: Student in an Organized Health Care Education/Training Program

## 2017-12-19 ENCOUNTER — Ambulatory Visit
Admission: RE | Admit: 2017-12-19 | Discharge: 2017-12-19 | Disposition: A | Discharge status: Home / Self Care | Payer: PPO | Source: Ambulatory Visit | Attending: Student in an Organized Health Care Education/Training Program | Admitting: Student in an Organized Health Care Education/Training Program

## 2017-12-19 VITALS — BP 136/92 | HR 90 | Temp 98.5°F | Resp 16 | Ht 68.0 in | Wt 215.0 lb

## 2017-12-19 DIAGNOSIS — Z7982 Long term (current) use of aspirin: Secondary | ICD-10-CM | POA: Insufficient documentation

## 2017-12-19 DIAGNOSIS — G8929 Other chronic pain: Secondary | ICD-10-CM

## 2017-12-19 DIAGNOSIS — J342 Deviated nasal septum: Secondary | ICD-10-CM | POA: Diagnosis not present

## 2017-12-19 DIAGNOSIS — Z79899 Other long term (current) drug therapy: Secondary | ICD-10-CM | POA: Insufficient documentation

## 2017-12-19 DIAGNOSIS — M533 Sacrococcygeal disorders, not elsewhere classified: Secondary | ICD-10-CM | POA: Insufficient documentation

## 2017-12-19 DIAGNOSIS — M5442 Lumbago with sciatica, left side: Secondary | ICD-10-CM

## 2017-12-19 DIAGNOSIS — M25552 Pain in left hip: Secondary | ICD-10-CM

## 2017-12-19 DIAGNOSIS — Z9049 Acquired absence of other specified parts of digestive tract: Secondary | ICD-10-CM | POA: Diagnosis not present

## 2017-12-19 DIAGNOSIS — M48062 Spinal stenosis, lumbar region with neurogenic claudication: Secondary | ICD-10-CM | POA: Diagnosis not present

## 2017-12-19 DIAGNOSIS — Z79891 Long term (current) use of opiate analgesic: Secondary | ICD-10-CM | POA: Insufficient documentation

## 2017-12-19 MED ORDER — ROPIVACAINE HCL 2 MG/ML IJ SOLN
2.0000 mL | Freq: Once | INTRAMUSCULAR | Status: AC
  Administered 2017-12-19: 10 mL via EPIDURAL
  Filled 2017-12-19: qty 10

## 2017-12-19 MED ORDER — DEXAMETHASONE SODIUM PHOSPHATE 10 MG/ML IJ SOLN
10.0000 mg | Freq: Once | INTRAMUSCULAR | Status: AC
  Administered 2017-12-19: 10 mg
  Filled 2017-12-19: qty 1

## 2017-12-19 MED ORDER — LIDOCAINE HCL 2 % IJ SOLN
10.0000 mL | Freq: Once | INTRAMUSCULAR | Status: AC
  Administered 2017-12-19: 400 mg
  Filled 2017-12-19: qty 20

## 2017-12-19 MED ORDER — FENTANYL CITRATE (PF) 100 MCG/2ML IJ SOLN
25.0000 ug | INTRAMUSCULAR | Status: DC | PRN
  Administered 2017-12-19: 25 ug via INTRAVENOUS
  Filled 2017-12-19: qty 2

## 2017-12-19 MED ORDER — SODIUM CHLORIDE 0.9% FLUSH
2.0000 mL | Freq: Once | INTRAVENOUS | Status: AC
  Administered 2017-12-19: 10 mL

## 2017-12-19 MED ORDER — LACTATED RINGERS IV SOLN: 1000.0000 mL | Freq: Once | INTRAVENOUS | Status: DC

## 2017-12-19 MED ORDER — SODIUM CHLORIDE 0.9 % IJ SOLN
INTRAMUSCULAR | Status: AC
  Filled 2017-12-19: qty 10

## 2017-12-19 MED ORDER — IOPAMIDOL (ISOVUE-M 200) INJECTION 41%
10.0000 mL | Freq: Once | INTRAMUSCULAR | Status: AC
  Administered 2017-12-19: 10 mL via EPIDURAL
  Filled 2017-12-19: qty 10

## 2017-12-19 NOTE — Patient Instructions (Signed)

## 2017-12-19 NOTE — Progress Notes (Signed)
Patient's Name: Gary [name]  MRN: [id number]  Referring Provider: Kirk Ruths, MD  DOB: 31-Jan-1951  PCP: Kirk Ruths, MD  DOS: 12/19/2017  Note by: Gillis Santa, MD  Service setting: Ambulatory outpatient  Specialty: Interventional Pain Management  Patient type: Established  Location: [facility] (AMB) Pain Management Facility  Visit type: Interventional Procedure   Primary Reason for Visit: Interventional Pain Management Treatment. CC: Back Pain (lower)  Procedure:          Anesthesia, Analgesia, Anxiolysis:  Type: Trans-Foraminal Epidural Steroid Injection          Purpose: Diagnostic/Therapeutic Region: Posterolateral Lumbosacral Level: L5-S1 Level Laterality: Left-Sided      Target Area: The 6 o'clock position under the pedicle, on the affected side. Approach: Posterior Percutaneous Paravertebral approach. Position: Prone  Type: Moderate (Conscious) Sedation combined with Local Anesthesia Indication(s): Analgesia and Anxiety Route: Intravenous (IV) IV Access: Secured Sedation: Meaningful verbal contact was maintained at all times during the procedure  Local Anesthetic: Lidocaine 1-2%   Indications: 1. Chronic bilateral low back pain with left-sided sciatica   2. Spinal stenosis of lumbar region with neurogenic claudication   3. Chronic left sacroiliac joint pain   4. Chronic hip pain, left    Pain Score: Pre-procedure: 8 /10 Post-procedure: 0-No pain/10  Pre-op Assessment:  Mr. Kuang is a 67 y.o. (year old), male patient, seen today for interventional treatment. He  has a past surgical history that includes Cholecystectomy; Knee surgery (Left); and deviated septum repair. Mr. Ilic has a current medication list which includes the following prescription(s): acetaminophen, albuterol sulfate, aspirin, bupropion, buspirone, duloxetine, esomeprazole, ezetimibe, fenofibrate, finasteride, gabapentin, hydrochlorothiazide, niacin-lovastatin, oxycodone-acetaminophen,  oxycodone-acetaminophen, oxycodone-acetaminophen, pravastatin, sildenafil, cholecalciferol, meloxicam, and methocarbamol, and the following Facility-Administered Medications: fentanyl and lactated ringers. His primarily concern today is the Back Pain (lower)  Initial Vital Signs:  Pulse/HCG Rate: 90ECG Heart Rate: 93 Temp: 98.5 F (36.9 C) Resp: 16 BP: (!) 139/97 SpO2: 97 %  BMI: Estimated body mass index is 32.69 kg/m as calculated from the following:   Height as of this encounter: 5\' 8"  (1.727 m).   Weight as of this encounter: 215 lb (97.5 kg).  Risk Assessment: Allergies: Reviewed. He is allergic to niacin-lovastatin er; atorvastatin; simvastatin; and propoxyphene.  Allergy Precautions: None required Coagulopathies: Reviewed. None identified.  Blood-thinner therapy: None at this time Active Infection(s): Reviewed. None identified. Mr. Barbary is afebrile  Site Confirmation: Mr. Spraggins was asked to confirm the procedure and laterality before marking the site Procedure checklist: Completed Consent: Before the procedure and under the influence of no sedative(s), amnesic(s), or anxiolytics, the patient was informed of the treatment options, risks and possible complications. To fulfill our ethical and legal obligations, as recommended by the American Medical Association's Code of Ethics, I have informed the patient of my clinical impression; the nature and purpose of the treatment or procedure; the risks, benefits, and possible complications of the intervention; the alternatives, including doing nothing; the risk(s) and benefit(s) of the alternative treatment(s) or procedure(s); and the risk(s) and benefit(s) of doing nothing. The patient was provided information about the general risks and possible complications associated with the procedure. These may include, but are not limited to: failure to achieve desired goals, infection, bleeding, organ or nerve damage, allergic reactions,  paralysis, and death. In addition, the patient was informed of those risks and complications associated to Spine-related procedures, such as failure to decrease pain; infection (i.e.: Meningitis, epidural or intraspinal abscess); bleeding (i.e.: epidural hematoma, subarachnoid hemorrhage,  or any other type of intraspinal or peri-dural bleeding); organ or nerve damage (i.e.: Any type of peripheral nerve, nerve root, or spinal cord injury) with subsequent damage to sensory, motor, and/or autonomic systems, resulting in permanent pain, numbness, and/or weakness of one or several areas of the body; allergic reactions; (i.e.: anaphylactic reaction); and/or death. Furthermore, the patient was informed of those risks and complications associated with the medications. These include, but are not limited to: allergic reactions (i.e.: anaphylactic or anaphylactoid reaction(s)); adrenal axis suppression; blood sugar elevation that in diabetics may result in ketoacidosis or comma; water retention that in patients with history of congestive heart failure may result in shortness of breath, pulmonary edema, and decompensation with resultant heart failure; weight gain; swelling or edema; medication-induced neural toxicity; particulate matter embolism and blood vessel occlusion with resultant organ, and/or nervous system infarction; and/or aseptic necrosis of one or more joints. Finally, the patient was informed that Medicine is not an exact science; therefore, there is also the possibility of unforeseen or unpredictable risks and/or possible complications that may result in a catastrophic outcome. The patient indicated having understood very clearly. We have given the patient no guarantees and we have made no promises. Enough time was given to the patient to ask questions, all of which were answered to the patient's satisfaction. Mr. Teasdale has indicated that he wanted to continue with the procedure. Attestation: I, the  ordering provider, attest that I have discussed with the patient the benefits, risks, side-effects, alternatives, likelihood of achieving goals, and potential problems during recovery for the procedure that I have provided informed consent. Date  Time: 12/19/2017  8:02 AM  Pre-Procedure Preparation:  Monitoring: As per clinic protocol. Respiration, ETCO2, SpO2, BP, heart rate and rhythm monitor placed and checked for adequate function Safety Precautions: Patient was assessed for positional comfort and pressure points before starting the procedure. Time-out: I initiated and conducted the "Time-out" before starting the procedure, as per protocol. The patient was asked to participate by confirming the accuracy of the "Time Out" information. Verification of the correct person, site, and procedure were performed and confirmed by me, the nursing staff, and the patient. "Time-out" conducted as per Joint Commission's Universal Protocol (UP.01.01.01). Time: [phone]  Description of Procedure:          Area Prepped: Entire Posterior Lumbosacral Area Prepping solution: ChloraPrep (2% chlorhexidine gluconate and 70% isopropyl alcohol) Safety Precautions: Aspiration looking for blood return was conducted prior to all injections. At no point did we inject any substances, as a needle was being advanced. No attempts were made at seeking any paresthesias. Safe injection practices and needle disposal techniques used. Medications properly checked for expiration dates. SDV (single dose vial) medications used. Description of the Procedure: Protocol guidelines were followed. The patient was placed in position over the procedure table. The target area was identified and the area prepped in the usual manner. Skin & deeper tissues infiltrated with local anesthetic. Appropriate amount of time allowed to pass for local anesthetics to take effect. The procedure needles were then advanced to the target area. Proper needle placement  secured. Negative aspiration confirmed. Solution injected in intermittent fashion, asking for systemic symptoms every 0.5cc of injectate. The needles were then removed and the area cleansed, making sure to leave some of the prepping solution back to take advantage of its long term bactericidal properties. Vitals:   12/19/17 0848 12/19/17 0850 12/19/17 0900 12/19/17 0910  BP: (!) 135/99 (!) 138/94 (!) 137/98 (!) 136/92  Pulse:  Resp: 15 16 18 16   Temp:      TempSrc:      SpO2: 91% 93% 96% 97%  Weight:      Height:        Start Time: 0844 hrs. End Time: 0849 hrs. Materials:  Needle(s) Type: Spinal Needle Gauge: 22G Length: 3.5-in Medication(s): Please see orders for medications and dosing details. 2.5 cc solution made of 1 cc of 0.2% ropivacaine, 1 cc of Decadron 10 mg/cc, 0.5 cc of normal saline. Imaging Guidance (Spinal):          Type of Imaging Technique: Fluoroscopy Guidance (Spinal) Indication(s): Assistance in needle guidance and placement for procedures requiring needle placement in or near specific anatomical locations not easily accessible without such assistance. Exposure Time: Please see nurses notes. Contrast: Before injecting any contrast, we confirmed that the patient did not have an allergy to iodine, shellfish, or radiological contrast. Once satisfactory needle placement was completed at the desired level, radiological contrast was injected. Contrast injected under live fluoroscopy. No contrast complications. See chart for type and volume of contrast used. Fluoroscopic Guidance: I was personally present during the use of fluoroscopy. "Tunnel Vision Technique" used to obtain the best possible view of the target area. Parallax error corrected before commencing the procedure. "Direction-depth-direction" technique used to introduce the needle under continuous pulsed fluoroscopy. Once target was reached, antero-posterior, oblique, and lateral fluoroscopic projection used  confirm needle placement in all planes. Images permanently stored in EMR. Interpretation: I personally interpreted the imaging intraoperatively. Adequate needle placement confirmed in multiple planes. Appropriate spread of contrast into desired area was observed. No evidence of afferent or efferent intravascular uptake. No intrathecal or subarachnoid spread observed. Permanent images saved into the patient's record.  Antibiotic Prophylaxis:   Anti-infectives (From admission, onward)   None     Indication(s): None identified  Post-operative Assessment:  Post-procedure Vital Signs:  Pulse/HCG Rate: 9080 Temp: 98.5 F (36.9 C) Resp: 16 BP: (!) 136/92 SpO2: 97 %  EBL: None  Complications: No immediate post-treatment complications observed by team, or reported by patient.  Note: The patient tolerated the entire procedure well. A repeat set of vitals were taken after the procedure and the patient was kept under observation following institutional policy, for this type of procedure. Post-procedural neurological assessment was performed, showing return to baseline, prior to discharge. The patient was provided with post-procedure discharge instructions, including a section on how to identify potential problems. Should any problems arise concerning this procedure, the patient was given instructions to immediately contact us, at any time, without hesitation. In any case, we plan to contact the patient by telephone for a follow-up status report regarding this interventional procedure.  Comments:  No additional relevant information. 5 out of 5 strength bilateral lower extremity: Plantar flexion, dorsiflexion, knee flexion, knee extension.  Plan of Care   Imaging Orders     DG C-Arm 1-60 Min-No Report Procedure Orders    No procedure(s) ordered today    Medications ordered for procedure: Meds ordered this encounter  Medications  . lactated ringers infusion 1,000 mL  . fentaNYL (SUBLIMAZE)  injection 25-100 mcg    Make sure Narcan is available in the pyxis when using this medication. In the event of respiratory depression (RR< 8/min): Titrate NARCAN (naloxone) in increments of 0.1 to 0.2 mg IV at 2-3 minute intervals, until desired degree of reversal.  . iopamidol (ISOVUE-M) 41 % intrathecal injection 10 mL  . ropivacaine (PF) 2 mg/mL (0.2%) (NAROPIN) injection 2 mL  .  sodium chloride flush (NS) 0.9 % injection 2 mL  . lidocaine (XYLOCAINE) 2 % (with pres) injection 200 mg  . dexamethasone (DECADRON) injection 10 mg   Medications administered: We administered fentaNYL, iopamidol, ropivacaine (PF) 2 mg/mL (0.2%), sodium chloride flush, lidocaine, and dexamethasone.  See the medical record for exact dosing, route, and time of administration.  New Prescriptions   No medications on file   Disposition: Discharge home  Discharge Date & Time: 12/19/2017; 0912 hrs.   Future Appointments  Date Time Provider [place]  02/25/2018 11:30 AM Vevelyn Francois, NP [facility] None   Primary Care Physician: Kirk Ruths, MD Location: [facility] Outpatient Pain Management Facility Note by: Gillis Santa, MD Date: 12/19/2017; Time: 11:00 AM  Disclaimer:  Medicine is not an exact science. The only guarantee in medicine is that nothing is guaranteed. It is important to note that the decision to proceed with this intervention was based on the information collected from the patient. The Data and conclusions were drawn from the patient's questionnaire, the interview, and the physical examination. Because the information was provided in large part by the patient, it cannot be guaranteed that it has not been purposely or unconsciously manipulated. Every effort has been made to obtain as much relevant data as possible for this evaluation. It is important to note that the conclusions that lead to this procedure are derived in large part from the available data. Always take into account that the  treatment will also be dependent on availability of resources and existing treatment guidelines, considered by other Pain Management Practitioners as being common knowledge and practice, at the time of the intervention. For Medico-Legal purposes, it is also important to point out that variation in procedural techniques and pharmacological choices are the acceptable norm. The indications, contraindications, technique, and results of the above procedure should only be interpreted and judged by a Board-Certified Interventional Pain Specialist with extensive familiarity and expertise in the same exact procedure and technique.

## 2017-12-19 NOTE — Progress Notes (Signed)
Safety precautions to be maintained throughout the outpatient stay will include: orient to surroundings, keep bed in low position, maintain call bell within reach at all times, provide assistance with transfer out of bed and ambulation.  

## 2017-12-20 NOTE — Telephone Encounter (Signed)
Referral entered and sent to [facility]

## 2018-01-02 DIAGNOSIS — M47812 Spondylosis without myelopathy or radiculopathy, cervical region: Secondary | ICD-10-CM | POA: Diagnosis not present

## 2018-01-02 DIAGNOSIS — M25511 Pain in right shoulder: Secondary | ICD-10-CM | POA: Diagnosis not present

## 2018-01-02 DIAGNOSIS — G8929 Other chronic pain: Secondary | ICD-10-CM | POA: Diagnosis not present

## 2018-01-02 DIAGNOSIS — R200 Anesthesia of skin: Secondary | ICD-10-CM | POA: Diagnosis not present

## 2018-01-02 DIAGNOSIS — M25512 Pain in left shoulder: Secondary | ICD-10-CM | POA: Diagnosis not present

## 2018-01-02 DIAGNOSIS — R202 Paresthesia of skin: Secondary | ICD-10-CM | POA: Diagnosis not present

## 2018-01-02 DIAGNOSIS — M5030 Other cervical disc degeneration, unspecified cervical region: Secondary | ICD-10-CM | POA: Diagnosis not present

## 2018-01-02 DIAGNOSIS — M19012 Primary osteoarthritis, left shoulder: Secondary | ICD-10-CM | POA: Diagnosis not present

## 2018-01-08 ENCOUNTER — Encounter: Payer: Self-pay | Admitting: *Deleted

## 2018-01-09 ENCOUNTER — Ambulatory Visit
Admission: RE | Admit: 2018-01-09 | Discharge: 2018-01-09 | Disposition: A | Discharge status: Home / Self Care | Payer: PPO | Source: Ambulatory Visit | Attending: Internal Medicine | Admitting: Internal Medicine

## 2018-01-09 ENCOUNTER — Other Ambulatory Visit: Payer: Self-pay

## 2018-01-09 ENCOUNTER — Encounter
Admission: RE | Disposition: A | Discharge status: Home / Self Care | Payer: Self-pay | Source: Ambulatory Visit | Attending: Internal Medicine

## 2018-01-09 ENCOUNTER — Ambulatory Visit: Payer: PPO | Admitting: Anesthesiology

## 2018-01-09 ENCOUNTER — Encounter: Payer: Self-pay | Admitting: *Deleted

## 2018-01-09 DIAGNOSIS — J439 Emphysema, unspecified: Secondary | ICD-10-CM | POA: Diagnosis not present

## 2018-01-09 DIAGNOSIS — F418 Other specified anxiety disorders: Secondary | ICD-10-CM | POA: Diagnosis not present

## 2018-01-09 DIAGNOSIS — K2270 Barrett's esophagus without dysplasia: Secondary | ICD-10-CM | POA: Insufficient documentation

## 2018-01-09 DIAGNOSIS — E782 Mixed hyperlipidemia: Secondary | ICD-10-CM | POA: Insufficient documentation

## 2018-01-09 DIAGNOSIS — Z87891 Personal history of nicotine dependence: Secondary | ICD-10-CM | POA: Diagnosis not present

## 2018-01-09 DIAGNOSIS — I872 Venous insufficiency (chronic) (peripheral): Secondary | ICD-10-CM | POA: Diagnosis not present

## 2018-01-09 DIAGNOSIS — F329 Major depressive disorder, single episode, unspecified: Secondary | ICD-10-CM | POA: Insufficient documentation

## 2018-01-09 DIAGNOSIS — K449 Diaphragmatic hernia without obstruction or gangrene: Secondary | ICD-10-CM | POA: Diagnosis not present

## 2018-01-09 DIAGNOSIS — K219 Gastro-esophageal reflux disease without esophagitis: Secondary | ICD-10-CM | POA: Diagnosis not present

## 2018-01-09 DIAGNOSIS — N400 Enlarged prostate without lower urinary tract symptoms: Secondary | ICD-10-CM | POA: Insufficient documentation

## 2018-01-09 DIAGNOSIS — K640 First degree hemorrhoids: Secondary | ICD-10-CM | POA: Diagnosis not present

## 2018-01-09 DIAGNOSIS — M1990 Unspecified osteoarthritis, unspecified site: Secondary | ICD-10-CM | POA: Diagnosis not present

## 2018-01-09 DIAGNOSIS — Z1211 Encounter for screening for malignant neoplasm of colon: Secondary | ICD-10-CM | POA: Insufficient documentation

## 2018-01-09 DIAGNOSIS — G43909 Migraine, unspecified, not intractable, without status migrainosus: Secondary | ICD-10-CM | POA: Insufficient documentation

## 2018-01-09 DIAGNOSIS — D125 Benign neoplasm of sigmoid colon: Secondary | ICD-10-CM | POA: Insufficient documentation

## 2018-01-09 DIAGNOSIS — G4733 Obstructive sleep apnea (adult) (pediatric): Secondary | ICD-10-CM | POA: Diagnosis not present

## 2018-01-09 DIAGNOSIS — Z86010 Personal history of colonic polyps: Secondary | ICD-10-CM | POA: Insufficient documentation

## 2018-01-09 DIAGNOSIS — K635 Polyp of colon: Secondary | ICD-10-CM | POA: Diagnosis not present

## 2018-01-09 DIAGNOSIS — J449 Chronic obstructive pulmonary disease, unspecified: Secondary | ICD-10-CM | POA: Insufficient documentation

## 2018-01-09 DIAGNOSIS — K648 Other hemorrhoids: Secondary | ICD-10-CM | POA: Diagnosis not present

## 2018-01-09 DIAGNOSIS — I10 Essential (primary) hypertension: Secondary | ICD-10-CM | POA: Diagnosis not present

## 2018-01-09 DIAGNOSIS — F419 Anxiety disorder, unspecified: Secondary | ICD-10-CM | POA: Diagnosis not present

## 2018-01-09 DIAGNOSIS — Z7982 Long term (current) use of aspirin: Secondary | ICD-10-CM | POA: Diagnosis not present

## 2018-01-09 DIAGNOSIS — M069 Rheumatoid arthritis, unspecified: Secondary | ICD-10-CM | POA: Diagnosis not present

## 2018-01-09 HISTORY — DX: Anxiety disorder, unspecified: F41.9

## 2018-01-09 HISTORY — DX: Rheumatoid arthritis, unspecified: M06.9

## 2018-01-09 HISTORY — DX: Benign prostatic hyperplasia without lower urinary tract symptoms: N40.0

## 2018-01-09 HISTORY — DX: Unspecified osteoarthritis, unspecified site: M19.90

## 2018-01-09 HISTORY — DX: Emphysema, unspecified: J43.9

## 2018-01-09 HISTORY — DX: Abnormality of globulin: R77.1

## 2018-01-09 HISTORY — PX: COLONOSCOPY WITH PROPOFOL: SHX5780

## 2018-01-09 HISTORY — DX: Migraine, unspecified, not intractable, without status migrainosus: G43.909

## 2018-01-09 HISTORY — DX: Other cervical disc degeneration, unspecified cervical region: M50.30

## 2018-01-09 HISTORY — PX: ESOPHAGOGASTRODUODENOSCOPY (EGD) WITH PROPOFOL: SHX5813

## 2018-01-09 HISTORY — DX: Nonrheumatic mitral (valve) insufficiency: I34.0

## 2018-01-09 HISTORY — DX: Chronic obstructive pulmonary disease, unspecified: J44.9

## 2018-01-09 SURGERY — COLONOSCOPY WITH PROPOFOL
Anesthesia: General

## 2018-01-09 MED ORDER — PROPOFOL 500 MG/50ML IV EMUL
INTRAVENOUS | Status: AC
  Filled 2018-01-09: qty 50

## 2018-01-09 MED ORDER — GLYCOPYRROLATE 0.2 MG/ML IJ SOLN
INTRAMUSCULAR | Status: AC
  Filled 2018-01-09: qty 1

## 2018-01-09 MED ORDER — PROPOFOL 500 MG/50ML IV EMUL
INTRAVENOUS | Status: DC | PRN
  Administered 2018-01-09: 150 ug/kg/min via INTRAVENOUS

## 2018-01-09 MED ORDER — SODIUM CHLORIDE 0.9 % IV SOLN: INTRAVENOUS | Status: DC

## 2018-01-09 MED ORDER — LIDOCAINE HCL (CARDIAC) PF 100 MG/5ML IV SOSY
PREFILLED_SYRINGE | INTRAVENOUS | Status: DC | PRN
  Administered 2018-01-09: 100 mg via INTRAVENOUS

## 2018-01-09 MED ORDER — LIDOCAINE HCL (PF) 2 % IJ SOLN
INTRAMUSCULAR | Status: AC
  Filled 2018-01-09: qty 10

## 2018-01-09 MED ORDER — GLYCOPYRROLATE 0.2 MG/ML IJ SOLN
INTRAMUSCULAR | Status: DC | PRN
  Administered 2018-01-09: 0.2 mg via INTRAVENOUS

## 2018-01-09 MED ORDER — PROPOFOL 10 MG/ML IV BOLUS
INTRAVENOUS | Status: DC | PRN
  Administered 2018-01-09: 100 mg via INTRAVENOUS

## 2018-01-09 NOTE — Op Note (Signed)
[facility] Gastroenterology Patient Name: Gary [name] Procedure Date: 01/09/2018 10:07 AM MRN: [id number] Account #: [id number] Date of Birth: [birth year] Admit Type: Outpatient Age: 67 Room: [facility] ENDO ROOM 4 Gender: Male Note Status: Finalized Procedure:            Upper GI endoscopy Indications:          Surveillance for malignancy due to personal history of                        Barrett's esophagus Providers:            Benay Pike. Alice Reichert MD, MD Referring MD:         Ocie Cornfield. Ouida Sills MD, MD (Referring MD) Medicines:            Propofol per Anesthesia Complications:        No immediate complications. Procedure:            Pre-Anesthesia Assessment:                       - The risks and benefits of the procedure and the                        sedation options and risks were discussed with the                        patient. All questions were answered and informed                        consent was obtained.                       - Patient identification and proposed procedure were                        verified prior to the procedure by the nurse. The                        procedure was verified in the procedure room.                       - ASA Grade Assessment: II - A patient with mild                        systemic disease.                       - After reviewing the risks and benefits, the patient                        was deemed in satisfactory condition to undergo the                        procedure.                       After obtaining informed consent, the endoscope was                        passed under direct vision. Throughout the procedure,  the patient's blood pressure, pulse, and oxygen                        saturations were monitored continuously. The Endoscope                        was introduced through the mouth, and advanced to the                        third part of duodenum. The upper GI endoscopy was                         accomplished without difficulty. The patient tolerated                        the procedure well. Findings:      There were esophageal mucosal changes secondary to established       short-segment Barrett's disease present in the distal esophagus. The       maximum longitudinal extent of these mucosal changes was 2 cm in length.       Mucosa was biopsied with a cold forceps for histology in 4 quadrants at       intervals of 2 cm in the lower third of the esophagus. One specimen       bottle was sent to pathology.      A 3 cm hiatal hernia was present.      The examined duodenum was normal.      The exam was otherwise without abnormality. Impression:           - Esophageal mucosal changes secondary to established                        short-segment Barrett's disease. Biopsied.                       - 3 cm hiatal hernia.                       - Normal examined duodenum.                       - The examination was otherwise normal. Recommendation:       - Await pathology results.                       - Proceed with colonoscopy Procedure Code(s):    --- Professional ---                       [phone], Esophagogastroduodenoscopy, flexible, transoral;                        with biopsy, single or multiple Diagnosis Code(s):    --- Professional ---                       K44.9, Diaphragmatic hernia without obstruction or                        gangrene                       K22.70, Barrett's esophagus without dysplasia CPT copyright 2017  American Medical Association. All rights reserved. The codes documented in this report are preliminary and upon coder review may  be revised to meet current compliance requirements. Efrain Sella MD, MD 01/09/2018 10:40:08 AM This report has been signed electronically. Number of Addenda: 0 Note Initiated On: 01/09/2018 10:07 AM      [facility]

## 2018-01-09 NOTE — Op Note (Signed)
[facility] Gastroenterology Patient Name: Gary [name] Procedure Date: 01/09/2018 10:06 AM MRN: [id number] Account #: [id number] Date of Birth: 1951-03-18 Admit Type: Outpatient Age: 67 Room: [facility] ENDO ROOM 4 Gender: Male Note Status: Finalized Procedure:            Colonoscopy Indications:          High risk colon cancer surveillance: Personal history                        of colonic polyps Providers:            Benay Pike. Rynn Markiewicz MD, MD Medicines:            Propofol per Anesthesia Complications:        No immediate complications. Procedure:            Pre-Anesthesia Assessment:                       - The risks and benefits of the procedure and the                        sedation options and risks were discussed with the                        patient. All questions were answered and informed                        consent was obtained.                       - Patient identification and proposed procedure were                        verified prior to the procedure by the nurse. The                        procedure was verified in the procedure room.                       - ASA Grade Assessment: III - A patient with severe                        systemic disease.                       - After reviewing the risks and benefits, the patient                        was deemed in satisfactory condition to undergo the                        procedure.                       After obtaining informed consent, the colonoscope was                        passed under direct vision. Throughout the procedure,                        the patient's blood pressure, pulse, and oxygen  saturations were monitored continuously. The                        Colonoscope was introduced through the anus and                        advanced to the the cecum, identified by appendiceal                        orifice and ileocecal valve. The colonoscopy was               somewhat difficult due to significant looping.                        Successful completion of the procedure was aided by                        applying abdominal pressure. The patient tolerated the                        procedure well. Findings:      The perianal and digital rectal examinations were normal. Pertinent       negatives include normal sphincter tone and no palpable rectal lesions.      A 3 mm polyp was found in the sigmoid colon. The polyp was sessile. The       polyp was removed with a jumbo cold forceps. Resection and retrieval       were complete.      Non-bleeding internal hemorrhoids were found during retroflexion. The       hemorrhoids were Grade I (internal hemorrhoids that do not prolapse).      The exam was otherwise without abnormality. Impression:           - One 3 mm polyp in the sigmoid colon, removed with a                        jumbo cold forceps. Resected and retrieved.                       - Non-bleeding internal hemorrhoids.                       - The examination was otherwise normal. Recommendation:       - Patient has a contact number available for                        emergencies. The signs and symptoms of potential                        delayed complications were discussed with the patient.                        Return to normal activities tomorrow. Written discharge                        instructions were provided to the patient.                       - Resume previous diet.                       -  Continue present medications.                       - Repeat colonoscopy in 5 years for surveillance.                       - Await pathology results from EGD, also performed                        today.                       - Return to GI office PRN.                       - The findings and recommendations were discussed with                        the patient and their family. Procedure Code(s):    --- Professional ---                        [phone], Colonoscopy, flexible; with biopsy, single or                        multiple Diagnosis Code(s):    --- Professional ---                       K64.0, First degree hemorrhoids                       D12.5, Benign neoplasm of sigmoid colon                       Z86.010, Personal history of colonic polyps CPT copyright 2017 American Medical Association. All rights reserved. The codes documented in this report are preliminary and upon coder review may  be revised to meet current compliance requirements. Efrain Sella MD, MD 01/09/2018 10:58:12 AM This report has been signed electronically. Number of Addenda: 0 Note Initiated On: 01/09/2018 10:06 AM Scope Withdrawal Time: 0 hours 8 minutes 29 seconds  Total Procedure Duration: 0 hours 13 minutes 17 seconds       [facility], [place]

## 2018-01-09 NOTE — H&P (Signed)
Outpatient short stay form Pre-procedure 01/09/2018 10:11 AM Gary [name], M.D.  Primary Physician: [name], M.D.  Reason for visit: Hx of Barrett's esophagus, personal hx of colon polyps.  History of present illness:  Patient here for surveillance of barrett's esophagus (indefinite for dysplasia) - 2012 and colon polyps. Patient denies change in bowel habits, rectal bleeding, weight loss or abdominal pain. Patient was advised to repeat EGD in 1 year; not sure if this happened.    Current Facility-Administered Medications:  .  0.9 %  sodium chloride infusion, , Intravenous, Continuous, Toledo, Benay Pike, MD  Medications Prior to Admission  Medication Sig Dispense Refill Last Dose  . acetaminophen (TYLENOL) 500 MG tablet Take as needed by mouth.    Past Month at Unknown time  . Albuterol Sulfate 108 (90 Base) MCG/ACT AEPB Inhale as needed into the lungs.    Past Month at Unknown time  . aspirin 81 MG tablet Take by mouth.   Past Month at Unknown time  . buPROPion (WELLBUTRIN XL) 300 MG 24 hr tablet Take by mouth.   01/08/2018 at 1000  . busPIRone (BUSPAR) 15 MG tablet Take by mouth.   01/08/2018 at 0800  . clobetasol ointment (TEMOVATE) 2.54 % Apply 1 application topically 2 (two) times daily.   Past Month at Unknown time  . clotrimazole-betamethasone (LOTRISONE) cream Apply 1 application topically 2 (two) times daily.   Past Month at Unknown time  . diclofenac sodium (VOLTAREN) 1 % GEL Apply topically 4 (four) times daily.   Past Month at Unknown time  . DULoxetine (CYMBALTA) 60 MG capsule Take 1 capsule (60 mg total) by mouth daily. 30 capsule 4 01/08/2018 at 1000  . esomeprazole (NEXIUM) 40 MG capsule Take by mouth.   01/08/2018 at 1000  . ezetimibe (ZETIA) 10 MG tablet    01/08/2018 at 1000  . fenofibrate (TRICOR) 48 MG tablet Take 48 mg daily by mouth.    01/08/2018 at 1000  . finasteride (PROSCAR) 5 MG tablet Take 5 mg daily by mouth.    01/08/2018 at 1000  . gabapentin  (NEURONTIN) 300 MG capsule Take 2 capsules (600 mg total) by mouth at bedtime. 60 capsule 4 Past Week at Unknown time  . hydrochlorothiazide (HYDRODIURIL) 25 MG tablet    01/08/2018 at 1000  . ibuprofen (ADVIL,MOTRIN) 200 MG tablet Take 200 mg by mouth every 6 (six) hours as needed.   Past Week at Unknown time  . meloxicam (MOBIC) 15 MG tablet Take 15 mg by mouth daily.   Past Week at 10000  . mupirocin ointment (BACTROBAN) 2 % Place 1 application into the nose 2 (two) times daily.   Past Month at Unknown time  . oxyCODONE-acetaminophen (PERCOCET) 10-325 MG tablet Take 1 tablet by mouth every 8 (eight) hours as needed for pain. 90 tablet 0 Past Week at Unknown time  . sildenafil (REVATIO) 20 MG tablet Take 3 to 5 tablets two hours before intercouse on an empty stomach.  Do not take with nitrates. 50 tablet 3 Past Week at Unknown time  . cholecalciferol (VITAMIN D) 1000 units tablet Take by mouth.   Not Taking at Unknown time  . methocarbamol (ROBAXIN) 500 MG tablet Take 500 mg by mouth 2 (two) times daily as needed for muscle spasms.   Not Taking  . niacin-lovastatin (ADVICOR) 1000-20 MG 24 hr tablet Take by mouth.   Taking  . [START ON 01/26/2018] oxyCODONE-acetaminophen (PERCOCET) 10-325 MG tablet Take 1 tablet by mouth every 8 (  eight) hours as needed for pain. 90 tablet 0 Taking  . pravastatin (PRAVACHOL) 20 MG tablet Take by mouth.   Taking     Allergies  Allergen Reactions  . Niacin-Lovastatin Er Other (See Comments)    unknown unknown  . Atorvastatin Other (See Comments)  . Simvastatin Other (See Comments)  . Propoxyphene Nausea Only     Past Medical History:  Diagnosis Date  . Anxiety   . Benign essential HTN 06/24/2015   Last Assessment & Plan:  Taking medications without noted side effects or dizziness.   Overview:  Last Assessment & Plan:  Is compliant with hypertensive medications without clear side effects or lack of control.    Marland Kitchen BPH (benign prostatic hyperplasia)   . COPD  (chronic obstructive pulmonary disease) ([place])   . Coronary artery disease 02/16/2014   Overview:  Minimal disease by cath 12/13  Last Assessment & Plan:  Seems to be tolerating medical regimen without significant side effects and symptoms such as worsening chest pain are not noted.   Overview:  Overview:  Minimal disease by cath 12/13  Last Assessment & Plan:  Seems to be tolerating medical regimen without significant side effects and symptoms such as worsening chest pain are not no  . DDD (degenerative disc disease), cervical   . Degenerative disc disease, cervical 01/22/2017  . Depression   . DJD (degenerative joint disease)   . Emphysema of lung ([place])   . Heel pain 10/10/2017  . Hyperglobulinemia   . Lumbar degenerative disc disease 01/22/2017  . Major depression in remission ([place]) 03/10/2014   Last Assessment & Plan:  Mood is doing well on meds Overview:  Last Assessment & Plan:  Mood is doing well on meds   . Migraines   . Mitral regurgitation   . Mixed hyperlipidemia 02/16/2014   Last Assessment & Plan:  Diet for healthy cholesterol is being attempted and no clear myalgia's or other side effects are noted.   Overview:  Last Assessment & Plan:  Low fat diet is being attempted and no significant side effects such as myalgia's are noted.    . Neck pain 01/24/2017  . OSA (obstructive sleep apnea) 07/05/2016   Last Assessment & Plan:  Continues to use cpap consistently and is continuing to benefit from it's use.    . Osteoarthritis of knee 08/12/2014   Overview:  Right knee is worse now  Last Assessment & Plan:  Diffuse pain on back in hips and knees also. Went to a pain clinic  Overview:  Overview:  Right knee is worse now  Last Assessment & Plan:  Diffuse pain on back in hips and knees also. Went to a pain clinic   . RA (rheumatoid arthritis) ([place])   . Spinal stenosis of lumbar region with neurogenic claudication 04/18/2016  . Spondylosis without myelopathy or radiculopathy, lumbar region 01/22/2017   . Venous insufficiency of both lower extremities 11/07/2016    Review of systems:  Otherwise negative.    Physical Exam  Gen: Alert, oriented. Appears stated age.  HEENT: [place]/AT. PERRLA. Lungs: CTA, no wheezes. CV: RR nl S1, S2. Abd: soft, benign, no masses. BS+ Ext: No edema. Pulses 2+    Planned procedures: Proceed with EGD and colonoscopy. The patient understands the nature of the planned procedure, indications, risks, alternatives and potential complications including but not limited to bleeding, infection, perforation, damage to internal organs and possible oversedation/side effects from anesthesia. The patient agrees and gives consent to proceed.  Please refer  to procedure notes for findings, recommendations and patient disposition/instructions.     Gary [name], M.D. Gastroenterology 01/09/2018  10:11 AM

## 2018-01-09 NOTE — Anesthesia Post-op Follow-up Note (Signed)
Anesthesia QCDR form completed.        

## 2018-01-09 NOTE — Anesthesia Postprocedure Evaluation (Signed)
Anesthesia Post Note  Patient: JAKYE MULLENS  Procedure(s) Performed: COLONOSCOPY WITH PROPOFOL (N/A ) ESOPHAGOGASTRODUODENOSCOPY (EGD) WITH PROPOFOL (N/A )  Patient location during evaluation: Endoscopy Anesthesia Type: General Level of consciousness: awake and alert Pain management: pain level controlled Vital Signs Assessment: post-procedure vital signs reviewed and stable Respiratory status: spontaneous breathing, nonlabored ventilation, respiratory function stable and patient connected to nasal cannula oxygen Cardiovascular status: blood pressure returned to baseline and stable Postop Assessment: no apparent nausea or vomiting Anesthetic complications: no     Last Vitals:  Vitals:   01/09/18 1120 01/09/18 1130  BP: 113/86 131/85  Pulse: 83 80  Resp: 16 18  Temp:    SpO2: 96% 97%    Last Pain:  Vitals:   01/09/18 1130  TempSrc:   PainSc: 0-No pain                 Martha Clan

## 2018-01-09 NOTE — Transfer of Care (Signed)
Immediate Anesthesia Transfer of Care Note  Patient: Gary [name]  Procedure(s) Performed: COLONOSCOPY WITH PROPOFOL (N/A ) ESOPHAGOGASTRODUODENOSCOPY (EGD) WITH PROPOFOL (N/A )  Patient Location: Endoscopy Unit  Anesthesia Type:General  Level of Consciousness: sedated  Airway & Oxygen Therapy: Patient Spontanous Breathing and Patient connected to nasal cannula oxygen  Post-op Assessment: Report given to RN and Post -op Vital signs reviewed and stable  Post vital signs: Reviewed and stable  Last Vitals:  Vitals Value Taken Time  BP 99/74 01/09/2018 11:00 AM  Temp 36.2 C 01/09/2018 11:00 AM  Pulse    Resp 12 01/09/2018 11:00 AM  SpO2 97 % 01/09/2018 11:00 AM    Last Pain:  Vitals:   01/09/18 1100  TempSrc: Tympanic  PainSc: Asleep         Complications: No apparent anesthesia complications

## 2018-01-09 NOTE — Interval H&P Note (Signed)
History and Physical Interval Note:  01/09/2018 10:14 AM  Gary [name]  has presented today for surgery, with the diagnosis of PHCP BARRETT'S  The various methods of treatment have been discussed with the patient and family. After consideration of risks, benefits and other options for treatment, the patient has consented to  Procedure(s): COLONOSCOPY WITH PROPOFOL (N/A) ESOPHAGOGASTRODUODENOSCOPY (EGD) WITH PROPOFOL (N/A) as a surgical intervention .  The patient's history has been reviewed, patient examined, no change in status, stable for surgery.  I have reviewed the patient's chart and labs.  Questions were answered to the patient's satisfaction.     [place], [place]

## 2018-01-09 NOTE — Anesthesia Preprocedure Evaluation (Signed)
Anesthesia Evaluation  Patient identified by MRN, date of birth, ID band Patient awake    Reviewed: Allergy & Precautions, H&P , NPO status , Patient's Chart, lab work & pertinent test results, reviewed documented beta blocker date and time   Airway Mallampati: I  TM Distance: >3 FB Neck ROM: full    Dental  (+) Dental Advidsory Given, Caps, Missing   Pulmonary neg shortness of breath, sleep apnea , COPD,  COPD inhaler, neg recent URI, former smoker,           Cardiovascular Exercise Tolerance: Good hypertension, (-) angina+ CAD  (-) Past MI, (-) Cardiac Stents and (-) CABG (-) dysrhythmias + Valvular Problems/Murmurs MR      Neuro/Psych PSYCHIATRIC DISORDERS Anxiety Depression negative neurological ROS     GI/Hepatic Neg liver ROS, GERD  ,  Endo/Other  negative endocrine ROS  Renal/GU negative Renal ROS  negative genitourinary   Musculoskeletal   Abdominal   Peds  Hematology negative hematology ROS (+)   Anesthesia Other Findings Past Medical History: No date: Anxiety 06/24/2015: Benign essential HTN     Comment:  Last Assessment & Plan:  Taking medications without               noted side effects or dizziness.   Overview:  Last               Assessment & Plan:  Is compliant with hypertensive               medications without clear side effects or lack of               control.   No date: BPH (benign prostatic hyperplasia) No date: COPD (chronic obstructive pulmonary disease) ([place]) 02/16/2014: Coronary artery disease     Comment:  Overview:  Minimal disease by cath 12/13  Last               Assessment & Plan:  Seems to be tolerating medical               regimen without significant side effects and symptoms               such as worsening chest pain are not noted.   Overview:                Overview:  Minimal disease by cath 12/13  Last Assessment              & Plan:  Seems to be tolerating medical regimen  without               significant side effects and symptoms such as worsening               chest pain are not no No date: DDD (degenerative disc disease), cervical 01/22/2017: Degenerative disc disease, cervical No date: Depression No date: DJD (degenerative joint disease) No date: Emphysema of lung ([place]) 10/10/2017: Heel pain No date: Hyperglobulinemia 01/22/2017: Lumbar degenerative disc disease 03/10/2014: Major depression in remission [facility])     Comment:  Last Assessment & Plan:  Mood is doing well on meds               Overview:  Last Assessment & Plan:  Mood is doing well on              meds  No date: Migraines No date: Mitral regurgitation 02/16/2014: Mixed hyperlipidemia     Comment:  Last Assessment & Plan:  Diet for healthy cholesterol is              being attempted and no clear myalgia's or other side               effects are noted.   Overview:  Last Assessment & Plan:                Low fat diet is being attempted and no significant side               effects such as myalgia's are noted.   01/24/2017: Neck pain 07/05/2016: OSA (obstructive sleep apnea)     Comment:  Last Assessment & Plan:  Continues to use cpap               consistently and is continuing to benefit from it's use.  08/12/2014: Osteoarthritis of knee     Comment:  Overview:  Right knee is worse now  Last Assessment &               Plan:  Diffuse pain on back in hips and knees also. Went               to a pain clinic  Overview:  Overview:  Right knee is               worse now  Last Assessment & Plan:  Diffuse pain on back               in hips and knees also. Went to a pain clinic  No date: RA (rheumatoid arthritis) ([place]) 04/18/2016: Spinal stenosis of lumbar region with neurogenic  claudication 01/22/2017: Spondylosis without myelopathy or radiculopathy, lumbar  region 11/07/2016: Venous insufficiency of both lower extremities   Reproductive/Obstetrics negative OB ROS                              Anesthesia Physical Anesthesia Plan  ASA: III  Anesthesia Plan: General   Post-op Pain Management:    Induction: Intravenous  PONV Risk Score and Plan: 2 and Propofol infusion and TIVA  Airway Management Planned: Natural Airway and Nasal Cannula  Additional Equipment:   Intra-op Plan:   Post-operative Plan:   Informed Consent: I have reviewed the patients History and Physical, chart, labs and discussed the procedure including the risks, benefits and alternatives for the proposed anesthesia with the patient or authorized representative who has indicated his/her understanding and acceptance.   Dental Advisory Given  Plan Discussed with: Anesthesiologist, CRNA and Surgeon  Anesthesia Plan Comments:         Anesthesia Quick Evaluation

## 2018-01-10 ENCOUNTER — Encounter: Payer: Self-pay | Admitting: Internal Medicine

## 2018-01-14 LAB — SURGICAL PATHOLOGY

## 2018-01-22 ENCOUNTER — Other Ambulatory Visit: Payer: Self-pay | Admitting: Sports Medicine

## 2018-01-22 DIAGNOSIS — G8929 Other chronic pain: Secondary | ICD-10-CM | POA: Diagnosis not present

## 2018-01-22 DIAGNOSIS — M7542 Impingement syndrome of left shoulder: Secondary | ICD-10-CM | POA: Diagnosis not present

## 2018-01-22 DIAGNOSIS — M25512 Pain in left shoulder: Secondary | ICD-10-CM | POA: Diagnosis not present

## 2018-01-22 DIAGNOSIS — M19012 Primary osteoarthritis, left shoulder: Secondary | ICD-10-CM | POA: Diagnosis not present

## 2018-01-22 DIAGNOSIS — S4992XD Unspecified injury of left shoulder and upper arm, subsequent encounter: Secondary | ICD-10-CM

## 2018-01-22 DIAGNOSIS — M7552 Bursitis of left shoulder: Secondary | ICD-10-CM | POA: Diagnosis not present

## 2018-01-24 DIAGNOSIS — R972 Elevated prostate specific antigen [PSA]: Secondary | ICD-10-CM | POA: Diagnosis not present

## 2018-01-25 ENCOUNTER — Other Ambulatory Visit: Payer: Self-pay | Admitting: Sports Medicine

## 2018-01-25 DIAGNOSIS — M7542 Impingement syndrome of left shoulder: Secondary | ICD-10-CM

## 2018-01-25 DIAGNOSIS — G8929 Other chronic pain: Secondary | ICD-10-CM

## 2018-01-25 DIAGNOSIS — M19012 Primary osteoarthritis, left shoulder: Secondary | ICD-10-CM

## 2018-01-25 DIAGNOSIS — M7552 Bursitis of left shoulder: Secondary | ICD-10-CM

## 2018-01-25 DIAGNOSIS — M25512 Pain in left shoulder: Secondary | ICD-10-CM

## 2018-01-25 DIAGNOSIS — S4992XD Unspecified injury of left shoulder and upper arm, subsequent encounter: Secondary | ICD-10-CM

## 2018-01-28 ENCOUNTER — Other Ambulatory Visit: Payer: Self-pay | Admitting: Urology

## 2018-01-29 DIAGNOSIS — M5412 Radiculopathy, cervical region: Secondary | ICD-10-CM | POA: Diagnosis not present

## 2018-01-29 DIAGNOSIS — M542 Cervicalgia: Secondary | ICD-10-CM | POA: Diagnosis not present

## 2018-01-29 DIAGNOSIS — M545 Low back pain: Secondary | ICD-10-CM | POA: Diagnosis not present

## 2018-01-29 DIAGNOSIS — G8929 Other chronic pain: Secondary | ICD-10-CM | POA: Diagnosis not present

## 2018-01-31 ENCOUNTER — Ambulatory Visit (INDEPENDENT_AMBULATORY_CARE_PROVIDER_SITE_OTHER): Payer: PPO | Admitting: Urology

## 2018-01-31 ENCOUNTER — Encounter: Payer: Self-pay | Admitting: Urology

## 2018-01-31 VITALS — BP 149/89 | HR 96 | Ht 68.0 in | Wt 214.8 lb

## 2018-01-31 DIAGNOSIS — C61 Malignant neoplasm of prostate: Secondary | ICD-10-CM | POA: Diagnosis not present

## 2018-01-31 NOTE — Progress Notes (Signed)
01/31/2018 11:12 AM   Gary [name] Oct 06, 1950 [id number]  Referring provider: Kirk Ruths, MD [place] [facility] [place], [place] [address]   Chief Complaint  Patient presents with  . Results    HPI: 67 year old male presents for prostate biopsy follow-up.  He initially saw Zara Council for lower urinary tract symptoms.  PSA on finasteride was 1.3 and he was noted to have mild firmness of the left prostate.  A prostate MRI was performed which was remarkable for an 11 g prostate.  There was a PI-RADS 3 lesion in the left mid gland is rating 6 mm.  There was no evidence of pelvic adenopathy or extracapsular/seminal vesicle involvement.  He underwent MR fusion biopsy in [place] on 01/24/2018.  Prostate volume was calculated at 19 g.  Standard 12 core biopsies were performed in addition to 3 biopsies of the region of interest.  He had no post biopsy complaints.  Pathology: 12/15 cores positive for adenocarcinoma the prostate.  All left-sided cores including the ROI biopsies were positive for Gleason 4+3/3+3 adenocarcinoma.  One core showed Gleason 3+3 adenocarcinoma.  Refer to the scanned pathology report for details.   PMH: Past Medical History:  Diagnosis Date  . Anxiety   . Benign essential HTN 06/24/2015   Last Assessment & Plan:  Taking medications without noted side effects or dizziness.   Overview:  Last Assessment & Plan:  Is compliant with hypertensive medications without clear side effects or lack of control.    Marland Kitchen BPH (benign prostatic hyperplasia)   . COPD (chronic obstructive pulmonary disease) ([place])   . Coronary artery disease 02/16/2014   Overview:  Minimal disease by cath 12/13  Last Assessment & Plan:  Seems to be tolerating medical regimen without significant side effects and symptoms such as worsening chest pain are not noted.   Overview:  Overview:  Minimal disease by cath 12/13  Last Assessment & Plan:  Seems to be tolerating  medical regimen without significant side effects and symptoms such as worsening chest pain are not no  . DDD (degenerative disc disease), cervical   . Degenerative disc disease, cervical 01/22/2017  . Depression   . DJD (degenerative joint disease)   . Emphysema of lung ([place])   . Heel pain 10/10/2017  . Hyperglobulinemia   . Lumbar degenerative disc disease 01/22/2017  . Major depression in remission ([place]) 03/10/2014   Last Assessment & Plan:  Mood is doing well on meds Overview:  Last Assessment & Plan:  Mood is doing well on meds   . Migraines   . Mitral regurgitation   . Mixed hyperlipidemia 02/16/2014   Last Assessment & Plan:  Diet for healthy cholesterol is being attempted and no clear myalgia's or other side effects are noted.   Overview:  Last Assessment & Plan:  Low fat diet is being attempted and no significant side effects such as myalgia's are noted.    . Neck pain 01/24/2017  . OSA (obstructive sleep apnea) 07/05/2016   Last Assessment & Plan:  Continues to use cpap consistently and is continuing to benefit from it's use.    . Osteoarthritis of knee 08/12/2014   Overview:  Right knee is worse now  Last Assessment & Plan:  Diffuse pain on back in hips and knees also. Went to a pain clinic  Overview:  Overview:  Right knee is worse now  Last Assessment & Plan:  Diffuse pain on back in hips and knees also.  Went to a pain clinic   . RA (rheumatoid arthritis) ([place])   . Spinal stenosis of lumbar region with neurogenic claudication 04/18/2016  . Spondylosis without myelopathy or radiculopathy, lumbar region 01/22/2017  . Venous insufficiency of both lower extremities 11/07/2016    Surgical History: Past Surgical History:  Procedure Laterality Date  . CARDIAC CATHETERIZATION    . CHOLECYSTECTOMY    . COLONOSCOPY    . COLONOSCOPY WITH PROPOFOL N/A 01/09/2018   Procedure: COLONOSCOPY WITH PROPOFOL;  Surgeon: Toledo, Benay Pike, MD;  Location: [facility] ENDOSCOPY;  Service: Gastroenterology;   Laterality: N/A;  . deviated septum repair    . ESOPHAGOGASTRODUODENOSCOPY (EGD) WITH PROPOFOL N/A 01/09/2018   Procedure: ESOPHAGOGASTRODUODENOSCOPY (EGD) WITH PROPOFOL;  Surgeon: Toledo, Benay Pike, MD;  Location: [facility] ENDOSCOPY;  Service: Gastroenterology;  Laterality: N/A;  . KNEE SURGERY Left   . NASAL SEPTUM SURGERY      Home Medications:  Allergies as of 01/31/2018      Reactions   Niacin-lovastatin Er Other (See Comments)   unknown unknown   Atorvastatin Other (See Comments)   Simvastatin Other (See Comments)   Propoxyphene Nausea Only   Passed out after taking it on an empty stomach      Medication List        Accurate as of 01/31/18 11:12 AM. Always use your most recent med list.          acetaminophen 500 MG tablet Commonly known as:  TYLENOL Take as needed by mouth.   Albuterol Sulfate 108 (90 Base) MCG/ACT Aepb Inhale as needed into the lungs.   aspirin 81 MG tablet Take by mouth.   buPROPion 300 MG 24 hr tablet Commonly known as:  WELLBUTRIN XL Take by mouth.   busPIRone 15 MG tablet Commonly known as:  BUSPAR Take by mouth.   cholecalciferol 1000 units tablet Commonly known as:  VITAMIN D Take by mouth.   clobetasol ointment 0.05 % Commonly known as:  TEMOVATE Apply 1 application topically 2 (two) times daily.   clotrimazole-betamethasone cream Commonly known as:  LOTRISONE Apply 1 application topically 2 (two) times daily.   diclofenac sodium 1 % Gel Commonly known as:  VOLTAREN Apply topically 4 (four) times daily.   DULoxetine 60 MG capsule Commonly known as:  CYMBALTA Take 1 capsule (60 mg total) by mouth daily.   esomeprazole 40 MG capsule Commonly known as:  NEXIUM Take by mouth.   ezetimibe 10 MG tablet Commonly known as:  ZETIA   fenofibrate 48 MG tablet Commonly known as:  TRICOR Take 48 mg daily by mouth.   finasteride 5 MG tablet Commonly known as:  PROSCAR Take 5 mg daily by mouth.   gabapentin 300 MG  capsule Commonly known as:  NEURONTIN Take 2 capsules (600 mg total) by mouth at bedtime.   hydrochlorothiazide 25 MG tablet Commonly known as:  HYDRODIURIL   ibuprofen 200 MG tablet Commonly known as:  ADVIL,MOTRIN Take 200 mg by mouth every 6 (six) hours as needed.   meloxicam 15 MG tablet Commonly known as:  MOBIC Take 15 mg by mouth daily.   methocarbamol 500 MG tablet Commonly known as:  ROBAXIN Take 500 mg by mouth 2 (two) times daily as needed for muscle spasms.   mupirocin ointment 2 % Commonly known as:  BACTROBAN Place 1 application into the nose 2 (two) times daily.   niacin-lovastatin 1000-20 MG 24 hr tablet Commonly known as:  ADVICOR Take by mouth.   oxyCODONE-acetaminophen 10-325 MG tablet  Commonly known as:  PERCOCET Take 1 tablet by mouth every 8 (eight) hours as needed for pain.   pravastatin 20 MG tablet Commonly known as:  PRAVACHOL Take by mouth.   sildenafil 20 MG tablet Commonly known as:  REVATIO Take 3 to 5 tablets two hours before intercouse on an empty stomach.  Do not take with nitrates.       Allergies:  Allergies  Allergen Reactions  . Niacin-Lovastatin Er Other (See Comments)    unknown unknown  . Atorvastatin Other (See Comments)  . Simvastatin Other (See Comments)  . Propoxyphene Nausea Only    Passed out after taking it on an empty stomach    Family History: Family History  Problem Relation Age of Onset  . Heart disease Mother     Social History:  reports that he quit smoking about 5 years ago. He has never used smokeless tobacco. He reports that he does not drink alcohol or use drugs.  ROS: No significant change from 10/10/2017  Physical Exam: BP (!) 149/89 (BP Location: Left Arm, Patient Position: Sitting, Cuff Size: Normal)   Pulse 96   Ht 5\' 8"  (1.727 m)   Wt 214 lb 12.8 oz (97.4 kg)   BMI 32.66 kg/m    Constitutional:  Alert and oriented, No acute distress.   Assessment & Plan:   67 year old male with high  volume T2b intermediate risk (unfavorable) adenocarcinoma the prostate.  Prior prostate MRI shows no pelvic adenopathy or evidence of seminal vesicle invasion.  Will schedule a bone scan.  He has a greater than 10-year life expectancy and we discussed treatment options including radical prostatectomy and radiation therapy.  The pros and cons of each treatment were discussed.  By [facility] nomograms he has an 18% chance of organ confined disease and an 80% chance of extracapsular extension.  With radical prostatectomy he could potentially need adjuvant radiation therapy.  He is interested in radiation and will schedule an appointment with radiation oncology.  Greater than 50% of this 15-minute visit was spent counseling the patient.   Abbie Sons, [facility] [address] [place], [place] [address] [phone]

## 2018-02-05 ENCOUNTER — Other Ambulatory Visit: Payer: Self-pay | Admitting: Student

## 2018-02-05 ENCOUNTER — Other Ambulatory Visit: Payer: Self-pay | Admitting: Sports Medicine

## 2018-02-05 DIAGNOSIS — M5412 Radiculopathy, cervical region: Secondary | ICD-10-CM

## 2018-02-05 DIAGNOSIS — M542 Cervicalgia: Secondary | ICD-10-CM

## 2018-02-06 ENCOUNTER — Other Ambulatory Visit: Payer: Self-pay

## 2018-02-06 ENCOUNTER — Ambulatory Visit
Admission: RE | Admit: 2018-02-06 | Discharge: 2018-02-06 | Disposition: A | Discharge status: Home / Self Care | Payer: PPO | Source: Ambulatory Visit | Attending: Radiation Oncology | Admitting: Radiation Oncology

## 2018-02-06 ENCOUNTER — Encounter: Payer: Self-pay | Admitting: Radiation Oncology

## 2018-02-06 VITALS — BP 142/95 | HR 88 | Temp 97.7°F | Resp 18 | Wt 215.3 lb

## 2018-02-06 DIAGNOSIS — N400 Enlarged prostate without lower urinary tract symptoms: Secondary | ICD-10-CM | POA: Diagnosis not present

## 2018-02-06 DIAGNOSIS — Z79899 Other long term (current) drug therapy: Secondary | ICD-10-CM | POA: Insufficient documentation

## 2018-02-06 DIAGNOSIS — J449 Chronic obstructive pulmonary disease, unspecified: Secondary | ICD-10-CM | POA: Insufficient documentation

## 2018-02-06 DIAGNOSIS — E785 Hyperlipidemia, unspecified: Secondary | ICD-10-CM | POA: Diagnosis not present

## 2018-02-06 DIAGNOSIS — I2510 Atherosclerotic heart disease of native coronary artery without angina pectoris: Secondary | ICD-10-CM | POA: Insufficient documentation

## 2018-02-06 DIAGNOSIS — F418 Other specified anxiety disorders: Secondary | ICD-10-CM | POA: Insufficient documentation

## 2018-02-06 DIAGNOSIS — G4730 Sleep apnea, unspecified: Secondary | ICD-10-CM | POA: Diagnosis not present

## 2018-02-06 DIAGNOSIS — R771 Abnormality of globulin: Secondary | ICD-10-CM | POA: Diagnosis not present

## 2018-02-06 DIAGNOSIS — I10 Essential (primary) hypertension: Secondary | ICD-10-CM | POA: Diagnosis not present

## 2018-02-06 DIAGNOSIS — R350 Frequency of micturition: Secondary | ICD-10-CM | POA: Insufficient documentation

## 2018-02-06 DIAGNOSIS — Z7982 Long term (current) use of aspirin: Secondary | ICD-10-CM | POA: Diagnosis not present

## 2018-02-06 DIAGNOSIS — Z87891 Personal history of nicotine dependence: Secondary | ICD-10-CM | POA: Diagnosis not present

## 2018-02-06 DIAGNOSIS — M5030 Other cervical disc degeneration, unspecified cervical region: Secondary | ICD-10-CM | POA: Insufficient documentation

## 2018-02-06 DIAGNOSIS — C61 Malignant neoplasm of prostate: Secondary | ICD-10-CM | POA: Diagnosis not present

## 2018-02-06 NOTE — Consult Note (Signed)
NEW PATIENT EVALUATION  Name: Gary [name]  MRN: [id number]  Date:   02/06/2018     DOB: November 09, 1950   This 67 y.o. male patient presents to the clinic for initial evaluation of stage IIB Gleason 7 (4+3) adenocarcinoma the prostate presenting on finasteride with a PSA of 1.3.  REFERRING PHYSICIAN: Kirk Ruths, MD  CHIEF COMPLAINT:  Chief Complaint  Patient presents with  . Prostate Cancer    Pt is here for initial consultation of prostate cancer.     DIAGNOSIS: The encounter diagnosis was Malignant neoplasm of prostate ([place]).   PREVIOUS INVESTIGATIONS:  Bone scan has been ordered Pathology reports reviewed Clinical notes reviewed  HPI: patient is a 67 year old male who was noted by urology to have firmness in the left prostate. Prostate MRI was performed with fusion biopsy.MRI she demonstratedThere was a PI-RADS 3 lesion in the left mid gland. Prostate measuring approximate 19 cc. 12 core biopsy which showed adenocarcinoma at a 15 sampled.highest Gleason score was Gleason 7 (4+3) with a mixture of Gleason 6 (3+3. Patient has had a bone scan ordered although not yet scheduled. He does have some slight urinary frequency and urgency and erectile dysfunction. Treatment options have been reviewed with the patient he is now referred to radiation oncology for opinion.  PLANNED TREATMENT REGIMEN: ADT therapy plus external beam treatment to prostate pelvic nodes plus I-125 interstitial implant for boost  PAST MEDICAL HISTORY:  has a past medical history of Anxiety, Benign essential HTN (06/24/2015), BPH (benign prostatic hyperplasia), COPD (chronic obstructive pulmonary disease) ([place]), Coronary artery disease (02/16/2014), DDD (degenerative disc disease), cervical, Degenerative disc disease, cervical (01/22/2017), Depression, DJD (degenerative joint disease), Emphysema of lung ([place]), Heel pain (10/10/2017), Hyperglobulinemia, Lumbar degenerative disc disease (01/22/2017), Major  depression in remission ([place]) (03/10/2014), Migraines, Mitral regurgitation, Mixed hyperlipidemia (02/16/2014), Neck pain (01/24/2017), OSA (obstructive sleep apnea) (07/05/2016), Osteoarthritis of knee (08/12/2014), RA (rheumatoid arthritis) ([place]), Spinal stenosis of lumbar region with neurogenic claudication (04/18/2016), Spondylosis without myelopathy or radiculopathy, lumbar region (01/22/2017), and Venous insufficiency of both lower extremities (11/07/2016).    PAST SURGICAL HISTORY:  Past Surgical History:  Procedure Laterality Date  . CARDIAC CATHETERIZATION    . CHOLECYSTECTOMY    . COLONOSCOPY    . COLONOSCOPY WITH PROPOFOL N/A 01/09/2018   Procedure: COLONOSCOPY WITH PROPOFOL;  Surgeon: Toledo, Benay Pike, MD;  Location: [facility] ENDOSCOPY;  Service: Gastroenterology;  Laterality: N/A;  . deviated septum repair    . ESOPHAGOGASTRODUODENOSCOPY (EGD) WITH PROPOFOL N/A 01/09/2018   Procedure: ESOPHAGOGASTRODUODENOSCOPY (EGD) WITH PROPOFOL;  Surgeon: Toledo, Benay Pike, MD;  Location: [facility] ENDOSCOPY;  Service: Gastroenterology;  Laterality: N/A;  . KNEE SURGERY Left   . NASAL SEPTUM SURGERY      FAMILY HISTORY: family history includes Heart disease in his mother.  SOCIAL HISTORY:  reports that he quit smoking about 5 years ago. He has never used smokeless tobacco. He reports that he does not drink alcohol or use drugs.  ALLERGIES: Niacin-lovastatin er; Atorvastatin; Simvastatin; and Propoxyphene  MEDICATIONS:  Current Outpatient Medications  Medication Sig Dispense Refill  . acetaminophen (TYLENOL) 500 MG tablet Take as needed by mouth.     . Albuterol Sulfate 108 (90 Base) MCG/ACT AEPB Inhale as needed into the lungs.     Marland Kitchen aspirin 81 MG tablet Take by mouth.    Marland Kitchen buPROPion (WELLBUTRIN XL) 300 MG 24 hr tablet Take by mouth.    . busPIRone (BUSPAR) 15 MG tablet Take by mouth.    . cholecalciferol (  VITAMIN D) 1000 units tablet Take by mouth.    . clobetasol ointment (TEMOVATE) 6.73 % Apply 1  application topically 2 (two) times daily.    . clotrimazole-betamethasone (LOTRISONE) cream Apply 1 application topically 2 (two) times daily.    . diclofenac sodium (VOLTAREN) 1 % GEL Apply topically 4 (four) times daily.    . DULoxetine (CYMBALTA) 60 MG capsule Take 1 capsule (60 mg total) by mouth daily. 30 capsule 4  . esomeprazole (NEXIUM) 40 MG capsule Take by mouth.    . ezetimibe (ZETIA) 10 MG tablet     . fenofibrate (TRICOR) 48 MG tablet Take 48 mg daily by mouth.     . finasteride (PROSCAR) 5 MG tablet Take 5 mg daily by mouth.     . gabapentin (NEURONTIN) 300 MG capsule Take 2 capsules (600 mg total) by mouth at bedtime. 60 capsule 4  . hydrochlorothiazide (HYDRODIURIL) 25 MG tablet     . ibuprofen (ADVIL,MOTRIN) 200 MG tablet Take 200 mg by mouth every 6 (six) hours as needed.    . meloxicam (MOBIC) 15 MG tablet Take 15 mg by mouth daily.    . methocarbamol (ROBAXIN) 500 MG tablet Take 500 mg by mouth 2 (two) times daily as needed for muscle spasms.    . mupirocin ointment (BACTROBAN) 2 % Place 1 application into the nose 2 (two) times daily.    . niacin-lovastatin (ADVICOR) 1000-20 MG 24 hr tablet Take by mouth.    . oxyCODONE-acetaminophen (PERCOCET) 10-325 MG tablet Take 1 tablet by mouth every 8 (eight) hours as needed for pain. 90 tablet 0  . pravastatin (PRAVACHOL) 20 MG tablet Take by mouth.    . sildenafil (REVATIO) 20 MG tablet Take 3 to 5 tablets two hours before intercouse on an empty stomach.  Do not take with nitrates. 50 tablet 3   No current facility-administered medications for this encounter.     ECOG PERFORMANCE STATUS:  1 - Symptomatic but completely ambulatory  REVIEW OF SYSTEMS:  Patient denies any weight loss, fatigue, weakness, fever, chills or night sweats. Patient denies any loss of vision, blurred vision. Patient denies any ringing  of the ears or hearing loss. No irregular heartbeat. Patient denies heart murmur or history of fainting. Patient denies  any chest pain or pain radiating to her upper extremities. Patient denies any shortness of breath, difficulty breathing at night, cough or hemoptysis. Patient denies any swelling in the lower legs. Patient denies any nausea vomiting, vomiting of blood, or coffee ground material in the vomitus. Patient denies any stomach pain. Patient states has had normal bowel movements no significant constipation or diarrhea. Patient denies any dysuria, hematuria or significant nocturia. Patient denies any problems walking, swelling in the joints or loss of balance. Patient denies any skin changes, loss of hair or loss of weight. Patient denies any excessive worrying or anxiety or significant depression. Patient denies any problems with insomnia. Patient denies excessive thirst, polyuria, polydipsia. Patient denies any swollen glands, patient denies easy bruising or easy bleeding. Patient denies any recent infections, allergies or URI. Patient "s visual fields have not changed significantly in recent time.    PHYSICAL EXAM: BP (!) 142/95 (BP Location: Left Arm, Patient Position: Sitting)   Pulse 88   Temp 97.7 F (36.5 C) (Tympanic)   Resp 18   Wt 215 lb 4.5 oz (97.7 kg)   BMI 32.73 kg/m  On rectal exam rectal sphincter tone is good there is firmness in the left lateral  lobe the prostate sulcus is preserved bilaterally. No other rectal abnormalities identified.Well-developed well-nourished patient in NAD. HEENT reveals PERLA, EOMI, discs not visualized.  Oral cavity is clear. No oral mucosal lesions are identified. Neck is clear without evidence of cervical or supraclavicular adenopathy. Lungs are clear to A&P. Cardiac examination is essentially unremarkable with regular rate and rhythm without murmur rub or thrill. Abdomen is benign with no organomegaly or masses noted. Motor sensory and DTR levels are equal and symmetric in the upper and lower extremities. Cranial nerves II through XII are grossly intact.  Proprioception is intact. No peripheral adenopathy or edema is identified. No motor or sensory levels are noted. Crude visual fields are within normal range.  LABORATORY DATA: pathology reports reviewed    RADIOLOGY RESULTS:bone scan is pending   IMPRESSION: stage IIB adenocarcinoma the prostate Gleason score 7 (64+25) in 67 year old male with low PSA on finasteride.  PLAN: at this time I discussed both surgical options as well as radiation therapy options. Based on his [facility] nomogrampatient has a 19% chance of organ confined disease in a 79% chance of extracapsular extension with0.4% chance of lymph node involvement. I have recommended starting at least 1/2-2 years of androgen deprivation therapy. Also treat his prostate and pelvic nodes using IM RT treatment planning and delivery. Would treat his pelvic nodes to approximate 4500 cGy and his prostate to 5000 cGy over 25 treatments. Risks and benefits of external beam radiation therapy including diarrhea fatigue in alteration of blood counts increased lower urinary tract symptoms all were discussed in detail with the patient. I would also boost his prostate with I-125 interstitial implant after completion of all radiation external beam radiation. Patient seems to compress my treatment plan well. I have set him up for treatment planning about a week's time hopefully bone scan will be performed by then. I'm also referring him back to Dr. Dene Gentry office for Lupron therapy. Patient seems to comprehend my treatment plan well.  I would like to take this opportunity to thank you for allowing me to participate in the care of your patient.Noreene Filbert, MD

## 2018-02-07 ENCOUNTER — Telehealth: Payer: Self-pay | Admitting: Urology

## 2018-02-07 NOTE — Telephone Encounter (Signed)
I got a message from the cancer center that this patient needed to have Lupron? Are you ok with that and can he just come in on the nurse schedule to get a shot?  According to Kim at the cancer center this would be his first one. Thanks, Sharyn Lull

## 2018-02-07 NOTE — Telephone Encounter (Signed)
Yes, can make a nurse visit appointment for Lupron injection

## 2018-02-08 ENCOUNTER — Ambulatory Visit
Admission: RE | Admit: 2018-02-08 | Discharge: 2018-02-08 | Disposition: A | Discharge status: Home / Self Care | Payer: PPO | Source: Ambulatory Visit | Attending: Sports Medicine | Admitting: Sports Medicine

## 2018-02-08 DIAGNOSIS — M7552 Bursitis of left shoulder: Secondary | ICD-10-CM

## 2018-02-08 DIAGNOSIS — M19012 Primary osteoarthritis, left shoulder: Secondary | ICD-10-CM | POA: Diagnosis not present

## 2018-02-08 DIAGNOSIS — S4992XD Unspecified injury of left shoulder and upper arm, subsequent encounter: Secondary | ICD-10-CM

## 2018-02-08 DIAGNOSIS — M7542 Impingement syndrome of left shoulder: Secondary | ICD-10-CM | POA: Insufficient documentation

## 2018-02-08 DIAGNOSIS — X58XXXD Exposure to other specified factors, subsequent encounter: Secondary | ICD-10-CM | POA: Diagnosis not present

## 2018-02-08 DIAGNOSIS — M25512 Pain in left shoulder: Secondary | ICD-10-CM | POA: Diagnosis not present

## 2018-02-08 DIAGNOSIS — G8929 Other chronic pain: Secondary | ICD-10-CM | POA: Insufficient documentation

## 2018-02-08 DIAGNOSIS — M5412 Radiculopathy, cervical region: Secondary | ICD-10-CM | POA: Diagnosis present

## 2018-02-08 DIAGNOSIS — S46012A Strain of muscle(s) and tendon(s) of the rotator cuff of left shoulder, initial encounter: Secondary | ICD-10-CM | POA: Diagnosis not present

## 2018-02-12 ENCOUNTER — Other Ambulatory Visit: Payer: Self-pay | Admitting: Nurse Practitioner

## 2018-02-13 NOTE — Telephone Encounter (Signed)
Will call with results

## 2018-02-13 NOTE — Telephone Encounter (Signed)
Called patient to schedule the injection and he stated that he was on the fence about if he wanted to have treatment or not. He said he was very upset and that he just wasn't sure, so I did not  schedule him yet.  He has  His bone scan on the 12th but did not have a follow up app so I made  Him one but the only time was the 15th @ 8:00. Did you want him to come in for results or were you going to call him? I told him I would ask.   Sharyn Lull

## 2018-02-15 ENCOUNTER — Ambulatory Visit
Admission: RE | Admit: 2018-02-15 | Discharge: 2018-02-15 | Disposition: A | Discharge status: Home / Self Care | Payer: PPO | Source: Ambulatory Visit | Attending: Student | Admitting: Student

## 2018-02-15 DIAGNOSIS — M542 Cervicalgia: Secondary | ICD-10-CM | POA: Diagnosis present

## 2018-02-15 DIAGNOSIS — M5412 Radiculopathy, cervical region: Secondary | ICD-10-CM | POA: Diagnosis not present

## 2018-02-15 DIAGNOSIS — M4802 Spinal stenosis, cervical region: Secondary | ICD-10-CM | POA: Diagnosis not present

## 2018-02-15 DIAGNOSIS — M50223 Other cervical disc displacement at C6-C7 level: Secondary | ICD-10-CM | POA: Diagnosis not present

## 2018-02-15 DIAGNOSIS — M5031 Other cervical disc degeneration,  high cervical region: Secondary | ICD-10-CM | POA: Diagnosis not present

## 2018-02-18 ENCOUNTER — Ambulatory Visit: Payer: Non-veteran care | Attending: Radiation Oncology

## 2018-02-19 ENCOUNTER — Encounter
Admission: RE | Admit: 2018-02-19 | Discharge: 2018-02-19 | Disposition: A | Discharge status: Home / Self Care | Payer: PPO | Source: Ambulatory Visit | Attending: Urology | Admitting: Urology

## 2018-02-19 DIAGNOSIS — C61 Malignant neoplasm of prostate: Secondary | ICD-10-CM | POA: Diagnosis not present

## 2018-02-19 MED ORDER — TECHNETIUM TC 99M MEDRONATE IV KIT
20.0000 | PACK | Freq: Once | INTRAVENOUS | Status: AC | PRN
  Administered 2018-02-19: 22.84 via INTRAVENOUS

## 2018-02-20 ENCOUNTER — Other Ambulatory Visit: Payer: Self-pay | Admitting: Student

## 2018-02-20 DIAGNOSIS — M7552 Bursitis of left shoulder: Secondary | ICD-10-CM | POA: Diagnosis not present

## 2018-02-20 DIAGNOSIS — G8929 Other chronic pain: Secondary | ICD-10-CM | POA: Diagnosis not present

## 2018-02-20 DIAGNOSIS — M67912 Unspecified disorder of synovium and tendon, left shoulder: Secondary | ICD-10-CM | POA: Diagnosis not present

## 2018-02-20 DIAGNOSIS — M4802 Spinal stenosis, cervical region: Secondary | ICD-10-CM

## 2018-02-20 DIAGNOSIS — M7542 Impingement syndrome of left shoulder: Secondary | ICD-10-CM | POA: Diagnosis not present

## 2018-02-20 DIAGNOSIS — M19012 Primary osteoarthritis, left shoulder: Secondary | ICD-10-CM | POA: Diagnosis not present

## 2018-02-20 DIAGNOSIS — M25512 Pain in left shoulder: Secondary | ICD-10-CM | POA: Diagnosis not present

## 2018-02-22 ENCOUNTER — Telehealth: Payer: Self-pay | Admitting: Urology

## 2018-02-22 ENCOUNTER — Ambulatory Visit: Payer: PPO | Admitting: Urology

## 2018-02-22 NOTE — Telephone Encounter (Signed)
Patient called the office late Friday afternoon and left a voice mail message to obtain the results of his bone scan.  He is scheduled for a CT scan on 02/27/18.  Patient can be reached at [phone].

## 2018-02-24 NOTE — Telephone Encounter (Signed)
Bone scan showed degenerative changes primarily.  The radiologist commented on areas of increased uptake in the L3 vertebral body and ribs.  Unlikely these represent metastasis.  I forwarded the bone scan report to Dr. Baruch Gouty to see if he recommended any further evaluation.

## 2018-02-25 ENCOUNTER — Other Ambulatory Visit: Payer: Self-pay

## 2018-02-25 ENCOUNTER — Encounter: Payer: Self-pay | Admitting: Nurse Practitioner

## 2018-02-25 ENCOUNTER — Encounter: Payer: PPO | Admitting: Student in an Organized Health Care Education/Training Program

## 2018-02-25 ENCOUNTER — Ambulatory Visit: Payer: PPO | Attending: Student in an Organized Health Care Education/Training Program | Admitting: Nurse Practitioner

## 2018-02-25 VITALS — BP 144/116 | HR 74 | Temp 97.9°F | Resp 18 | Ht 68.0 in | Wt 215.0 lb

## 2018-02-25 DIAGNOSIS — Z791 Long term (current) use of non-steroidal anti-inflammatories (NSAID): Secondary | ICD-10-CM | POA: Insufficient documentation

## 2018-02-25 DIAGNOSIS — M533 Sacrococcygeal disorders, not elsewhere classified: Secondary | ICD-10-CM | POA: Insufficient documentation

## 2018-02-25 DIAGNOSIS — M1710 Unilateral primary osteoarthritis, unspecified knee: Secondary | ICD-10-CM | POA: Insufficient documentation

## 2018-02-25 DIAGNOSIS — Z79891 Long term (current) use of opiate analgesic: Secondary | ICD-10-CM

## 2018-02-25 DIAGNOSIS — I2510 Atherosclerotic heart disease of native coronary artery without angina pectoris: Secondary | ICD-10-CM | POA: Diagnosis not present

## 2018-02-25 DIAGNOSIS — Z7982 Long term (current) use of aspirin: Secondary | ICD-10-CM | POA: Insufficient documentation

## 2018-02-25 DIAGNOSIS — G894 Chronic pain syndrome: Secondary | ICD-10-CM

## 2018-02-25 DIAGNOSIS — F329 Major depressive disorder, single episode, unspecified: Secondary | ICD-10-CM | POA: Diagnosis not present

## 2018-02-25 DIAGNOSIS — I872 Venous insufficiency (chronic) (peripheral): Secondary | ICD-10-CM | POA: Diagnosis not present

## 2018-02-25 DIAGNOSIS — E782 Mixed hyperlipidemia: Secondary | ICD-10-CM | POA: Insufficient documentation

## 2018-02-25 DIAGNOSIS — I10 Essential (primary) hypertension: Secondary | ICD-10-CM | POA: Insufficient documentation

## 2018-02-25 DIAGNOSIS — M48062 Spinal stenosis, lumbar region with neurogenic claudication: Secondary | ICD-10-CM | POA: Diagnosis not present

## 2018-02-25 DIAGNOSIS — M25552 Pain in left hip: Secondary | ICD-10-CM | POA: Insufficient documentation

## 2018-02-25 DIAGNOSIS — J439 Emphysema, unspecified: Secondary | ICD-10-CM | POA: Insufficient documentation

## 2018-02-25 DIAGNOSIS — M069 Rheumatoid arthritis, unspecified: Secondary | ICD-10-CM | POA: Diagnosis not present

## 2018-02-25 DIAGNOSIS — G8929 Other chronic pain: Secondary | ICD-10-CM | POA: Diagnosis not present

## 2018-02-25 DIAGNOSIS — G4733 Obstructive sleep apnea (adult) (pediatric): Secondary | ICD-10-CM | POA: Insufficient documentation

## 2018-02-25 DIAGNOSIS — Z760 Encounter for issue of repeat prescription: Secondary | ICD-10-CM | POA: Insufficient documentation

## 2018-02-25 DIAGNOSIS — Z8249 Family history of ischemic heart disease and other diseases of the circulatory system: Secondary | ICD-10-CM | POA: Insufficient documentation

## 2018-02-25 DIAGNOSIS — Z79899 Other long term (current) drug therapy: Secondary | ICD-10-CM | POA: Insufficient documentation

## 2018-02-25 DIAGNOSIS — Z9889 Other specified postprocedural states: Secondary | ICD-10-CM | POA: Insufficient documentation

## 2018-02-25 DIAGNOSIS — Z96652 Presence of left artificial knee joint: Secondary | ICD-10-CM | POA: Insufficient documentation

## 2018-02-25 DIAGNOSIS — M5010 Cervical disc disorder with radiculopathy, unspecified cervical region: Secondary | ICD-10-CM | POA: Diagnosis not present

## 2018-02-25 DIAGNOSIS — M4722 Other spondylosis with radiculopathy, cervical region: Secondary | ICD-10-CM | POA: Diagnosis not present

## 2018-02-25 DIAGNOSIS — M5136 Other intervertebral disc degeneration, lumbar region: Secondary | ICD-10-CM | POA: Diagnosis not present

## 2018-02-25 DIAGNOSIS — M47816 Spondylosis without myelopathy or radiculopathy, lumbar region: Secondary | ICD-10-CM | POA: Diagnosis not present

## 2018-02-25 DIAGNOSIS — Z87891 Personal history of nicotine dependence: Secondary | ICD-10-CM | POA: Insufficient documentation

## 2018-02-25 DIAGNOSIS — R739 Hyperglycemia, unspecified: Secondary | ICD-10-CM | POA: Insufficient documentation

## 2018-02-25 DIAGNOSIS — Z9049 Acquired absence of other specified parts of digestive tract: Secondary | ICD-10-CM | POA: Insufficient documentation

## 2018-02-25 MED ORDER — OXYCODONE-ACETAMINOPHEN 10-325 MG PO TABS: 1.0000 | ORAL_TABLET | Freq: Three times a day (TID) | ORAL | 0 refills | Status: DC | PRN

## 2018-02-25 MED ORDER — DULOXETINE HCL 60 MG PO CPEP: 60.0000 mg | ORAL_CAPSULE | Freq: Every day | ORAL | 4 refills | Status: DC

## 2018-02-25 MED ORDER — GABAPENTIN 300 MG PO CAPS: 600.0000 mg | ORAL_CAPSULE | Freq: Every day | ORAL | 4 refills | Status: DC

## 2018-02-25 NOTE — Progress Notes (Signed)
Nursing Pain Medication Assessment:  Safety precautions to be maintained throughout the outpatient stay will include: orient to surroundings, keep bed in low position, maintain call bell within reach at all times, provide assistance with transfer out of bed and ambulation.   Medication Inspection Compliance: Pill count conducted under aseptic conditions, in front of the patient. Neither the pills nor the bottle was removed from the patient's sight at any time. Once count was completed pills were immediately returned to the patient in their original bottle.  Medication: Oxycodone/APAP Pill/Patch Count: 0 of 90 pills remain Pill/Patch Appearance: Markings consistent with prescribed medication Bottle Appearance: Standard pharmacy container. Clearly labeled. Filled Date: 53 / 19 / 2019 Last Medication intake:  Day before yesterday

## 2018-02-25 NOTE — Progress Notes (Signed)
Patient's Name: Gary [name]  MRN: [id number]  Referring Provider: Kirk Ruths, MD  DOB: 1950/11/10  PCP: Kirk Ruths, MD  DOS: 02/25/2018  Note by: Vevelyn Francois NP  Service setting: Ambulatory outpatient  Specialty: Interventional Pain Management  Location: [facility] (AMB) Pain Management Facility    Patient type: Established    Primary Reason(s) for Visit: Encounter for prescription drug management. (Level of risk: moderate)  CC: Medication Refill  HPI  [name] is a 67 y.o. year old, male patient, who comes today for a medication management evaluation. He has Spondylosis without myelopathy or radiculopathy, lumbar region; Lumbar degenerative disc disease; Degenerative disc disease, cervical; Chronic pain syndrome; Facet arthropathy, lumbar; Benign essential HTN; Chronic hyperglycemia; Osteoarthritis of knee; Coronary artery disease; Health care maintenance; Heel pain; Localized, primary osteoarthritis; Low back pain; Major depression in remission ([place]); Mixed hyperlipidemia; Neck pain; OSA (obstructive sleep apnea); Pain medication agreement signed; Spinal stenosis of lumbar region with neurogenic claudication; Venous insufficiency of both lower extremities; DDD (degenerative disc disease), cervical; Other intervertebral disc degeneration, lumbar region; Chronic bilateral low back pain with left-sided sciatica; Chronic left sacroiliac joint pain; Chronic hip pain, left; and Cervical spondylosis with radiculopathy on their problem list. His primarily concern today is the Medication Refill  Pain Assessment: Location: Lower, Right, Left Back Radiating: "Radiates to upper thigh" Onset: More than a month ago Duration: Chronic pain Quality: Constant("weakness, annoying") Severity: 8 /10 (subjective, self-reported pain score)  Note: Reported level is compatible with observation. Clinically the patient looks like a 2/10 A 2/10 is viewed as "Mild to Moderate" and described as  noticeable and distracting. Impossible to hide from other people. More frequent flare-ups. Still possible to adapt and function close to normal. It can be very annoying and may have occasional stronger flare-ups. With discipline, patients may get used to it and adapt.       When using our objective Pain Scale, levels between 6 and 10/10 are said to belong in an emergency room, as it progressively worsens from a 6/10, described as severely limiting, requiring emergency care not usually available at an outpatient pain management facility. At a 6/10 level, communication becomes difficult and requires great effort. Assistance to reach the emergency department may be required. Facial flushing and profuse sweating along with potentially dangerous increases in heart rate and blood pressure will be evident. Effect on ADL: "Very limiting"  Timing: Constant Modifying factors: Medications  BP: (!) 144/116  HR: 74  [name] was last scheduled for an appointment on 02/12/2018 for medication management. During today's appointment we reviewed Mr. Kelsay chronic pain status, as well as his outpatient medication regimen.  He has seen neurosurgery for his upper extremity numbness and tingling.  He is going to have MRI of his neck.  He admits that if he is not told that surgery would correct his pain and numbness and tingling and he is not interested in surgery .He admits that he is under increased stress was recently diagnosed with prostate cancer.  He admits that he is also been evaluated for possible metastasis secondary to 9 lymph nodes being positive.  The patient  reports that he does not use drugs. His body mass index is 32.69 kg/m.  Further details on both, my assessment(s), as well as the proposed treatment plan, please see below.  Controlled Substance Pharmacotherapy Assessment REMS (Risk Evaluation and Mitigation Strategy)  Analgesic:Oxycodone10 mg 3 times daily as needed, quantity  90 MME/day:Approximately92m/day. RJanne Napoleon RN  02/25/2018 12:00 PM  Sign at close encounter Nursing Pain Medication Assessment:  Safety precautions to be maintained throughout the outpatient stay will include: orient to surroundings, keep bed in low position, maintain call bell within reach at all times, provide assistance with transfer out of bed and ambulation.   Medication Inspection Compliance: Pill count conducted under aseptic conditions, in front of the patient. Neither the pills nor the bottle was removed from the patient's sight at any time. Once count was completed pills were immediately returned to the patient in their original bottle.  Medication: Oxycodone/APAP Pill/Patch Count: 0 of 90 pills remain Pill/Patch Appearance: Markings consistent with prescribed medication Bottle Appearance: Standard pharmacy container. Clearly labeled. Filled Date: 60 / 19 / 2019 Last Medication intake:  Day before yesterday   Pharmacokinetics: Liberation and absorption (onset of action): WNL Distribution (time to peak effect): WNL Metabolism and excretion (duration of action): WNL         Pharmacodynamics: Desired effects: Analgesia: Mr. Santagata reports >50% benefit. Functional ability: Patient reports that medication allows him to accomplish basic ADLs Clinically meaningful improvement in function (CMIF): Sustained CMIF goals met Perceived effectiveness: Described as relatively effective, allowing for increase in activities of daily living (ADL) Undesirable effects: Side-effects or Adverse reactions: None reported Monitoring: [place] PMP: Online review of the past 49-monthperiod conducted. Compliant with practice rules and regulations Last UDS on record: Summary  Date Value Ref Range Status  01/22/2017 FINAL  Final    Comment:    ==================================================================== TOXASSURE COMP DRUG  ANALYSIS,UR ==================================================================== Test                             Result       Flag       Units Drug Present and Declared for Prescription Verification   Gabapentin                     PRESENT      EXPECTED Drug Present not Declared for Prescription Verification   Hydrocodone                    62           UNEXPECTED ng/mg creat   Hydromorphone                  49           UNEXPECTED ng/mg creat    Sources of hydrocodone include scheduled prescription    medications. Hydromorphone is an expected metabolite of    hydrocodone. Hydromorphone is also available as a scheduled    prescription medication.   Acetaminophen                  PRESENT      UNEXPECTED Drug Absent but Declared for Prescription Verification   Bupropion                      Not Detected UNEXPECTED   Salicylate                     Not Detected UNEXPECTED    Aspirin, as indicated in the declared medication list, is not    always detected even when used as directed. ==================================================================== Test                      Result    Flag   Units  Ref Range   Creatinine              145              mg/dL      >=20 ==================================================================== Declared Medications:  The flagging and interpretation on this report are based on the  following declared medications.  Unexpected results may arise from  inaccuracies in the declared medications.  **Note: The testing scope of this panel includes these medications:  Bupropion (Wellbutrin)  Gabapentin (Neurontin)  **Note: The testing scope of this panel does not include small to  moderate amounts of these reported medications:  Aspirin  **Note: The testing scope of this panel does not include following  reported medications:  Buspirone (BuSpar)  Omeprazole (Nexium) ==================================================================== For clinical  consultation, please call [phone]. ====================================================================    UDS interpretation: Non-Compliant          Medication Assessment Form: Reviewed. Abnormalities discussed Treatment compliance: Deficiencies noted and steps taken to remind the patient of the seriousness of adequate therapy compliance Risk Assessment Profile: Aberrant behavior: See prior evaluations. None observed or detected today Comorbid factors increasing risk of overdose: See prior notes. No additional risks detected today Opioid risk tool (ORT) (Total Score): 1 Personal History of Substance Abuse (SUD-Substance use disorder):  Alcohol: Negative  Illegal Drugs: Negative  Rx Drugs: Negative  ORT Risk Level calculation: Low Risk Risk of substance use disorder (SUD): Low Opioid Risk Tool - 02/25/18 1209      Family History of Substance Abuse   Alcohol  Negative    Illegal Drugs  Negative    Rx Drugs  Negative      Personal History of Substance Abuse   Alcohol  Negative    Illegal Drugs  Negative    Rx Drugs  Negative      Age   Age between 78-45 years   No      Psychological Disease   Psychological Disease  Negative    Depression  Positive      Total Score   Opioid Risk Tool Scoring  1    Opioid Risk Interpretation  Low Risk      ORT Scoring interpretation table:  Score <3 = Low Risk for SUD  Score between 4-7 = Moderate Risk for SUD  Score >8 = High Risk for Opioid Abuse   Risk Mitigation Strategies:  Patient Counseling: Covered Patient-Prescriber Agreement (PPA): Present and active  Notification to other healthcare providers: Done  Pharmacologic Plan: No change in therapy, at this time.             Laboratory Chemistry  Inflammation Markers (CRP: Acute Phase) (ESR: Chronic Phase) No results found for: CRP, ESRSEDRATE, LATICACIDVEN                       Rheumatology Markers No results found for: RF, ANA, LABURIC, URICUR, LYMEIGGIGMAB,  LYMEABIGMQN, HLAB27                      Renal Function Markers No results found for: BUN, CREATININE, BCR, GFRAA, GFRNONAA                           Hepatic Function Markers No results found for: AST, ALT, ALBUMIN, ALKPHOS, HCVAB, AMYLASE, LIPASE, AMMONIA                      Electrolytes No results found  for: NA, K, CL, CALCIUM, MG, PHOS                      Neuropathy Markers No results found for: VITAMINB12, FOLATE, HGBA1C, HIV                      CNS Tests No results found for: COLORCSF, APPEARCSF, RBCCOUNTCSF, WBCCSF, POLYSCSF, LYMPHSCSF, EOSCSF, PROTEINCSF, GLUCCSF, JCVIRUS, CSFOLI, IGGCSF                      Bone Pathology Markers No results found for: VD25OH, [id number], [id number], [id number], 25OHVITD1, 25OHVITD2, 25OHVITD3, TESTOFREE, TESTOSTERONE                       Coagulation Parameters No results found for: INR, LABPROT, APTT, PLT, DDIMER, LABHEMA                      Cardiovascular Markers No results found for: BNP, CKTOTAL, CKMB, TROPONINI, HGB, HCT                       CA Markers No results found for: CEA, CA125, LABCA2                      Note: Lab results reviewed.  Recent Diagnostic Imaging Results  NM Bone Scan Whole Body CLINICAL DATA:  Prostate cancer, high risk, staging, LEFT shoulder pain, history of LEFT knee replacement, rheumatoid arthritis, osteoarthritis, degenerative disc disease  EXAM: NUCLEAR MEDICINE WHOLE BODY BONE SCAN  TECHNIQUE: Whole body anterior and posterior images were obtained approximately 3 hours after intravenous injection of radiopharmaceutical.  RADIOPHARMACEUTICALS:  22.84 mCi Technetium-61mMDP IV  COMPARISON:  None  Correlation: MRI LEFT shoulder 02/08/2018, MR cervical spine 02/15/2018, CT chest 09/01/2008  FINDINGS: Uptake at the shoulders, sternoclavicular joints, LEFT elbow, BILATERAL wrists, knees, and feet, typically degenerative or through the.  Abnormal increased tracer localization at L3,  posterior LEFT tenth rib medially, and questionably the anterior LEFT first rib cannot exclude metastases.  Uptake in the cervical spine is laterally positioned and likely degenerative.  Uptake in the lower lumbar spine LEFT lateral aspect is likely degenerative.  Photopenic defect at the LEFT knee favoring unicompartmental prosthesis.  Minimal increased tracer localization at several adjacent posterior RIGHT ribs suggesting old fractures, corresponding to old fractures on a remote CT.  Tracer accumulation at the cervical region anteriorly could potentially be within ossified laryngeal cartilage.  Retained tracer within bladder question outflow obstruction.  Soft tissue distribution of tracer otherwise unremarkable.  IMPRESSION: Abnormal increased tracer localization at L3 vertebral body, posterior LEFT tenth rib medially, and question anterior LEFT first rib cannot exclude osseous metastases.  Extensive degenerative type uptake at multiple sites as above.  Significant retained tracer within urinary bladder question outlet obstruction.  Electronically Signed   By: MLavonia DanaM.D.   On: 02/19/2018 16:55  Complexity Note: Imaging results reviewed. Results shared with Mr. RCybulski using Layman's terms.                         Meds   Current Outpatient Medications:  .  acetaminophen (TYLENOL) 500 MG tablet, Take as needed by mouth. , Disp: , Rfl:  .  Albuterol Sulfate 108 (90 Base) MCG/ACT AEPB, Inhale as needed into the lungs. , Disp: , Rfl:  .  aspirin 81 MG tablet,  Take by mouth., Disp: , Rfl:  .  buPROPion (WELLBUTRIN XL) 300 MG 24 hr tablet, Take by mouth., Disp: , Rfl:  .  busPIRone (BUSPAR) 15 MG tablet, Take by mouth., Disp: , Rfl:  .  cholecalciferol (VITAMIN D) 1000 units tablet, Take by mouth., Disp: , Rfl:  .  clobetasol ointment (TEMOVATE) 7.41 %, Apply 1 application topically 2 (two) times daily., Disp: , Rfl:  .  clotrimazole-betamethasone (LOTRISONE)  cream, Apply 1 application topically 2 (two) times daily., Disp: , Rfl:  .  diclofenac sodium (VOLTAREN) 1 % GEL, Apply topically 4 (four) times daily., Disp: , Rfl:  .  esomeprazole (NEXIUM) 40 MG capsule, Take by mouth., Disp: , Rfl:  .  ezetimibe (ZETIA) 10 MG tablet, , Disp: , Rfl:  .  fenofibrate (TRICOR) 48 MG tablet, Take 48 mg daily by mouth. , Disp: , Rfl:  .  finasteride (PROSCAR) 5 MG tablet, Take 5 mg daily by mouth. , Disp: , Rfl:  .  hydrochlorothiazide (HYDRODIURIL) 25 MG tablet, , Disp: , Rfl:  .  ibuprofen (ADVIL,MOTRIN) 200 MG tablet, Take 200 mg by mouth every 6 (six) hours as needed., Disp: , Rfl:  .  meloxicam (MOBIC) 15 MG tablet, Take 15 mg by mouth daily., Disp: , Rfl:  .  methocarbamol (ROBAXIN) 500 MG tablet, Take 500 mg by mouth 2 (two) times daily as needed for muscle spasms., Disp: , Rfl:  .  mupirocin ointment (BACTROBAN) 2 %, Place 1 application into the nose 2 (two) times daily., Disp: , Rfl:  .  niacin-lovastatin (ADVICOR) 1000-20 MG 24 hr tablet, Take by mouth., Disp: , Rfl:  .  pravastatin (PRAVACHOL) 20 MG tablet, Take by mouth., Disp: , Rfl:  .  sildenafil (REVATIO) 20 MG tablet, Take 3 to 5 tablets two hours before intercouse on an empty stomach.  Do not take with nitrates., Disp: 50 tablet, Rfl: 3 .  DULoxetine (CYMBALTA) 60 MG capsule, Take 1 capsule (60 mg total) by mouth daily., Disp: 30 capsule, Rfl: 4 .  gabapentin (NEURONTIN) 300 MG capsule, Take 2 capsules (600 mg total) by mouth at bedtime., Disp: 60 capsule, Rfl: 4 .  [START ON 04/26/2018] oxyCODONE-acetaminophen (PERCOCET) 10-325 MG tablet, Take 1 tablet by mouth every 8 (eight) hours as needed for pain., Disp: 90 tablet, Rfl: 0 .  [START ON 03/27/2018] oxyCODONE-acetaminophen (PERCOCET) 10-325 MG tablet, Take 1 tablet by mouth every 8 (eight) hours as needed for pain., Disp: 90 tablet, Rfl: 0 .  oxyCODONE-acetaminophen (PERCOCET) 10-325 MG tablet, Take 1 tablet by mouth every 8 (eight) hours as  needed for pain., Disp: 90 tablet, Rfl: 0  ROS  Constitutional: Denies any fever or chills Gastrointestinal: No reported hemesis, hematochezia, vomiting, or acute GI distress Musculoskeletal: Denies any acute onset joint swelling, redness, loss of ROM, or weakness Neurological: No reported episodes of acute onset apraxia, aphasia, dysarthria, agnosia, amnesia, paralysis, loss of coordination, or loss of consciousness  Allergies  Mr. Bonanno is allergic to niacin-lovastatin er; atorvastatin; simvastatin; and propoxyphene.  [place]  Drug: Mr. Postlewait  reports that he does not use drugs. Alcohol:  reports that he does not drink alcohol. Tobacco:  reports that he quit smoking about 5 years ago. He has never used smokeless tobacco. Medical:  has a past medical history of Anxiety, Benign essential HTN (06/24/2015), BPH (benign prostatic hyperplasia), COPD (chronic obstructive pulmonary disease) ([place]), Coronary artery disease (02/16/2014), DDD (degenerative disc disease), cervical, Degenerative disc disease, cervical (01/22/2017), Depression, DJD (degenerative joint  disease), Emphysema of lung ([place]), Heel pain (10/10/2017), Hyperglobulinemia, Lumbar degenerative disc disease (01/22/2017), Major depression in remission ([place]) (03/10/2014), Migraines, Mitral regurgitation, Mixed hyperlipidemia (02/16/2014), Neck pain (01/24/2017), OSA (obstructive sleep apnea) (07/05/2016), Osteoarthritis of knee (08/12/2014), RA (rheumatoid arthritis) ([place]), Spinal stenosis of lumbar region with neurogenic claudication (04/18/2016), Spondylosis without myelopathy or radiculopathy, lumbar region (01/22/2017), and Venous insufficiency of both lower extremities (11/07/2016). Surgical: Mr. Gullickson  has a past surgical history that includes Cholecystectomy; Knee surgery (Left); deviated septum repair; Cardiac catheterization; Colonoscopy; Nasal septum surgery; Colonoscopy with propofol (N/A, 01/09/2018); and Esophagogastroduodenoscopy (egd) with  propofol (N/A, 01/09/2018). Family: family history includes Heart disease in his mother.  Constitutional Exam  General appearance: Well nourished, well developed, and well hydrated. In no apparent acute distress Vitals:   02/25/18 1148  BP: (!) 144/116  Pulse: 74  Resp: 18  Temp: 97.9 F (36.6 C)  TempSrc: Oral  SpO2: 96%  Weight: 215 lb (97.5 kg)  Height: '5\' 8"'  (1.727 m)  Psych/Mental status: Alert, oriented x 3 (person, place, & time)       Eyes: PERLA Respiratory: No evidence of acute respiratory distress  Cervical Spine Area Exam  Skin & Axial Inspection: No masses, redness, edema, swelling, or associated skin lesions Alignment: Symmetrical Functional ROM: Unrestricted ROM      Stability: No instability detected Muscle Tone/Strength: Functionally intact. No obvious neuro-muscular anomalies detected. Sensory (Neurological): Unimpaired Palpation: Complains of area being tender to palpation              Upper Extremity (UE) Exam    Side: Right upper extremity  Side: Left upper extremity  Skin & Extremity Inspection: Skin color, temperature, and hair growth are WNL. No peripheral edema or cyanosis. No masses, redness, swelling, asymmetry, or associated skin lesions. No contractures.  Skin & Extremity Inspection: Skin color, temperature, and hair growth are WNL. No peripheral edema or cyanosis. No masses, redness, swelling, asymmetry, or associated skin lesions. No contractures.  Functional ROM: Unrestricted ROM          Functional ROM: Unrestricted ROM          Muscle Tone/Strength: Functionally intact. No obvious neuro-muscular anomalies detected.  Muscle Tone/Strength: Functionally intact. No obvious neuro-muscular anomalies detected.  Sensory (Neurological): Dermatomal pain pattern          Sensory (Neurological): Dermatomal pain pattern          Palpation: No palpable anomalies              Palpation: No palpable anomalies                   Lumbar Spine Area Exam  Skin &  Axial Inspection: No masses, redness, or swelling Alignment: Symmetrical Functional ROM: Unrestricted ROM       Stability: No instability detected Muscle Tone/Strength: Functionally intact. No obvious neuro-muscular anomalies detected. Sensory (Neurological): Unimpaired Palpation: No palpable anomalies        Gait & Posture Assessment  Ambulation: Unassisted Gait: Relatively normal for age and body habitus Posture: WNL   Lower Extremity Exam    Side: Right lower extremity  Side: Left lower extremity  Stability: No instability observed          Stability: No instability observed          Skin & Extremity Inspection: Skin color, temperature, and hair growth are WNL. No peripheral edema or cyanosis. No masses, redness, swelling, asymmetry, or associated skin lesions. No contractures.  Skin & Extremity Inspection: Skin  color, temperature, and hair growth are WNL. No peripheral edema or cyanosis. No masses, redness, swelling, asymmetry, or associated skin lesions. No contractures.  Functional ROM: Unrestricted ROM                  Functional ROM: Unrestricted ROM                  Muscle Tone/Strength: Functionally intact. No obvious neuro-muscular anomalies detected.  Muscle Tone/Strength: Functionally intact. No obvious neuro-muscular anomalies detected.   Assessment  Primary Diagnosis & Pertinent Problem List: The primary encounter diagnosis was Spondylosis without myelopathy or radiculopathy, lumbar region. Diagnoses of Chronic hip pain, left, Chronic left sacroiliac joint pain, Cervical spondylosis with radiculopathy, Chronic pain syndrome, and Long term prescription opiate use were also pertinent to this visit.  Status Diagnosis  Controlled Controlled Controlled 1. Spondylosis without myelopathy or radiculopathy, lumbar region   2. Chronic hip pain, left   3. Chronic left sacroiliac joint pain   4. Cervical spondylosis with radiculopathy   5. Chronic pain syndrome   6. Long term  prescription opiate use     Problems updated and reviewed during this visit: Problem  Cervical Spondylosis With Radiculopathy   Plan of Care  Pharmacotherapy (Medications Ordered): Meds ordered this encounter  Medications  . DULoxetine (CYMBALTA) 60 MG capsule    Sig: Take 1 capsule (60 mg total) by mouth daily.    Dispense:  30 capsule    Refill:  4    Order Specific Question:   Supervising Provider    Answer:   Milinda Pointer [phone]  . gabapentin (NEURONTIN) 300 MG capsule    Sig: Take 2 capsules (600 mg total) by mouth at bedtime.    Dispense:  60 capsule    Refill:  4    Order Specific Question:   Supervising Provider    Answer:   Milinda Pointer [phone]  . oxyCODONE-acetaminophen (PERCOCET) 10-325 MG tablet    Sig: Take 1 tablet by mouth every 8 (eight) hours as needed for pain.    Dispense:  90 tablet    Refill:  0    Do not place this medication, or any other prescription from our practice, on "Automatic Refill". Patient may have prescription filled one day early if pharmacy is closed on scheduled refill date.    Order Specific Question:   Supervising Provider    Answer:   Milinda Pointer [phone]  . oxyCODONE-acetaminophen (PERCOCET) 10-325 MG tablet    Sig: Take 1 tablet by mouth every 8 (eight) hours as needed for pain.    Dispense:  90 tablet    Refill:  0    Do not place this medication, or any other prescription from our practice, on "Automatic Refill". Patient may have prescription filled one day early if pharmacy is closed on scheduled refill date.    Order Specific Question:   Supervising Provider    Answer:   Milinda Pointer [phone]  . oxyCODONE-acetaminophen (PERCOCET) 10-325 MG tablet    Sig: Take 1 tablet by mouth every 8 (eight) hours as needed for pain.    Dispense:  90 tablet    Refill:  0    Do not place this medication, or any other prescription from our practice, on "Automatic Refill". Patient may have prescription filled one day  early if pharmacy is closed on scheduled refill date.    Order Specific Question:   Supervising Provider    Answer:   Milinda Pointer [phone]   New  Prescriptions   No medications on file   Medications administered today: Tory Septer. Lopp had no medications administered during this visit. Lab-work, procedure(s), and/or referral(s): Orders Placed This Encounter  Procedures  . ToxASSURE Select 13 (MW), Urine   Imaging and/or referral(s): None  Interventional therapies: Planned, scheduled, and/or pending:   Not at this time.   Provider-requested follow-up: Return in about 3 months (around 05/28/2018) for MedMgmt.  Future Appointments  Date Time Provider [place]  02/27/2018 10:00 AM [facility]  05/28/2018 11:00 AM Vevelyn Francois, NP [facility] None   Primary Care Physician: Kirk Ruths, MD Location: [facility] Outpatient Pain Management Facility Note by: Vevelyn Francois NP Date: 02/25/2018; Time: 1:40 PM  Pain Score Disclaimer: We use the NRS-11 scale. This is a self-reported, subjective measurement of pain severity with only modest accuracy. It is used primarily to identify changes within a particular patient. It must be understood that outpatient pain scales are significantly less accurate that those used for research, where they can be applied under ideal controlled circumstances with minimal exposure to variables. In reality, the score is likely to be a combination of pain intensity and pain affect, where pain affect describes the degree of emotional arousal or changes in action readiness caused by the sensory experience of pain. Factors such as social and work situation, setting, emotional state, anxiety levels, expectation, and prior pain experience may influence pain perception and show large inter-individual differences that may also be affected by time variables.  Patient instructions provided during this appointment: Patient Instructions    ____________________________________________________________________________________________  Medication Rules  Applies to: All patients receiving prescriptions (written or electronic).  Pharmacy of record: Pharmacy where electronic prescriptions will be sent. If written prescriptions are taken to a different pharmacy, please inform the nursing staff. The pharmacy listed in the electronic medical record should be the one where you would like electronic prescriptions to be sent.  Prescription refills: Only during scheduled appointments. Applies to both, written and electronic prescriptions.  NOTE: The following applies primarily to controlled substances (Opioid* Pain Medications).   Patient's responsibilities: 1. Pain Pills: Bring all pain pills to every appointment (except for procedure appointments). 2. Pill Bottles: Bring pills in original pharmacy bottle. Always bring newest bottle. Bring bottle, even if empty. 3. Medication refills: You are responsible for knowing and keeping track of what medications you need refilled. The day before your appointment, write a list of all prescriptions that need to be refilled. Bring that list to your appointment and give it to the admitting nurse. Prescriptions will be written only during appointments. If you forget a medication, it will not be "Called in", "Faxed", or "electronically sent". You will need to get another appointment to get these prescribed. 4. Prescription Accuracy: You are responsible for carefully inspecting your prescriptions before leaving our office. Have the discharge nurse carefully go over each prescription with you, before taking them home. Make sure that your name is accurately spelled, that your address is correct. Check the name and dose of your medication to make sure it is accurate. Check the number of pills, and the written instructions to make sure they are clear and accurate. Make sure that you are given enough medication to  last until your next medication refill appointment. 5. Taking Medication: Take medication as prescribed. Never take more pills than instructed. Never take medication more frequently than prescribed. Taking less pills or less frequently is permitted and encouraged, when it comes to controlled substances (written prescriptions).  6. Inform other Doctors: Always inform, all of your healthcare providers, of all the medications you take. 7. Pain Medication from other Providers: You are not allowed to accept any additional pain medication from any other Doctor or Healthcare provider. There are two exceptions to this rule. (see below) In the event that you require additional pain medication, you are responsible for notifying us, as stated below. 8. Medication Agreement: You are responsible for carefully reading and following our Medication Agreement. This must be signed before receiving any prescriptions from our practice. Safely store a copy of your signed Agreement. Violations to the Agreement will result in no further prescriptions. (Additional copies of our Medication Agreement are available upon request.) 9. Laws, Rules, & Regulations: All patients are expected to follow all Federal and [employer], [employer], Rules, [employer]. Ignorance of the Laws does not constitute a valid excuse. The use of any illegal substances is prohibited. 10. Adopted CDC guidelines & recommendations: Target dosing levels will be at or below 60 MME/day. Use of benzodiazepines** is not recommended.  Exceptions: There are only two exceptions to the rule of not receiving pain medications from other Healthcare Providers. 1. Exception #1 (Emergencies): In the event of an emergency (i.e.: accident requiring emergency care), you are allowed to receive additional pain medication. However, you are responsible for: As soon as you are able, call our office [phone], at any time of the day or night, and leave a message stating your  name, the date and nature of the emergency, and the name and dose of the medication prescribed. In the event that your call is answered by a member of our staff, make sure to document and save the date, time, and the name of the person that took your information.  2. Exception #2 (Planned Surgery): In the event that you are scheduled by another doctor or dentist to have any type of surgery or procedure, you are allowed (for a period no longer than 30 days), to receive additional pain medication, for the acute post-op pain. However, in this case, you are responsible for picking up a copy of our "Post-op Pain Management for Surgeons" handout, and giving it to your surgeon or dentist. This document is available at our office, and does not require an appointment to obtain it. Simply go to our office during business hours (Monday-Thursday from 8:00 AM to 4:00 PM) (Friday 8:00 AM to 12:00 Noon) or if you have a scheduled appointment with [place], prior to your surgery, and ask for it by name. In addition, you will need to provide [place] with your name, name of your surgeon, type of surgery, and date of procedure or surgery.  *Opioid medications include: morphine, codeine, oxycodone, oxymorphone, hydrocodone, hydromorphone, meperidine, tramadol, tapentadol, buprenorphine, fentanyl, methadone. **Benzodiazepine medications include: diazepam (Valium), alprazolam (Xanax), clonazepam (Klonopine), lorazepam (Ativan), clorazepate (Tranxene), chlordiazepoxide (Librium), estazolam (Prosom), oxazepam (Serax), temazepam (Restoril), triazolam (Halcion) (Last updated: 06/07/2017) ____________________________________________________________________________________________   BMI Assessment: Estimated body mass index is 32.69 kg/m as calculated from the following:   Height as of this encounter: '5\' 8"'  (1.727 m).   Weight as of this encounter: 215 lb (97.5 kg).  BMI interpretation table: BMI level Category Range association with  higher incidence of chronic pain  <18 kg/m2 Underweight   18.5-24.9 kg/m2 Ideal body weight   25-29.9 kg/m2 Overweight Increased incidence by 20%  30-34.9 kg/m2 Obese (Class I) Increased incidence by 68%  35-39.9 kg/m2 Severe obesity (Class II) Increased incidence by 136%  >40 kg/m2 Extreme  obesity (Class III) Increased incidence by 254%   Patient's current BMI Ideal Body weight  Body mass index is 32.69 kg/m. Ideal body weight: 68.4 kg (150 lb 12.7 oz) Adjusted ideal body weight: 80 kg (176 lb 7.6 oz)   BMI Readings from Last 4 Encounters:  02/25/18 32.69 kg/m  02/06/18 32.73 kg/m  01/31/18 32.66 kg/m  01/09/18 32.69 kg/m   Wt Readings from Last 4 Encounters:  02/25/18 215 lb (97.5 kg)  02/06/18 215 lb 4.5 oz (97.7 kg)  01/31/18 214 lb 12.8 oz (97.4 kg)  01/09/18 214 lb 15.9 oz (97.5 kg)

## 2018-02-25 NOTE — Patient Instructions (Addendum)
____________________________________________________________________________________________  Medication Rules  Applies to: All patients receiving prescriptions (written or electronic).  Pharmacy of record: Pharmacy where electronic prescriptions will be sent. If written prescriptions are taken to a different pharmacy, please inform the nursing staff. The pharmacy listed in the electronic medical record should be the one where you would like electronic prescriptions to be sent.  Prescription refills: Only during scheduled appointments. Applies to both, written and electronic prescriptions.  NOTE: The following applies primarily to controlled substances (Opioid* Pain Medications).   Patient's responsibilities: 1. Pain Pills: Bring all pain pills to every appointment (except for procedure appointments). 2. Pill Bottles: Bring pills in original pharmacy bottle. Always bring newest bottle. Bring bottle, even if empty. 3. Medication refills: You are responsible for knowing and keeping track of what medications you need refilled. The day before your appointment, write a list of all prescriptions that need to be refilled. Bring that list to your appointment and give it to the admitting nurse. Prescriptions will be written only during appointments. If you forget a medication, it will not be "Called in", "Faxed", or "electronically sent". You will need to get another appointment to get these prescribed. 4. Prescription Accuracy: You are responsible for carefully inspecting your prescriptions before leaving our office. Have the discharge nurse carefully go over each prescription with you, before taking them home. Make sure that your name is accurately spelled, that your address is correct. Check the name and dose of your medication to make sure it is accurate. Check the number of pills, and the written instructions to make sure they are clear and accurate. Make sure that you are given enough medication to last  until your next medication refill appointment. 5. Taking Medication: Take medication as prescribed. Never take more pills than instructed. Never take medication more frequently than prescribed. Taking less pills or less frequently is permitted and encouraged, when it comes to controlled substances (written prescriptions).  6. Inform other Doctors: Always inform, all of your healthcare providers, of all the medications you take. 7. Pain Medication from other Providers: You are not allowed to accept any additional pain medication from any other Doctor or Healthcare provider. There are two exceptions to this rule. (see below) In the event that you require additional pain medication, you are responsible for notifying us, as stated below. 8. Medication Agreement: You are responsible for carefully reading and following our Medication Agreement. This must be signed before receiving any prescriptions from our practice. Safely store a copy of your signed Agreement. Violations to the Agreement will result in no further prescriptions. (Additional copies of our Medication Agreement are available upon request.) 9. Laws, Rules, & Regulations: All patients are expected to follow all Federal and State Laws, Statutes, Rules, & Regulations. Ignorance of the Laws does not constitute a valid excuse. The use of any illegal substances is prohibited. 10. Adopted CDC guidelines & recommendations: Target dosing levels will be at or below 60 MME/day. Use of benzodiazepines** is not recommended.  Exceptions: There are only two exceptions to the rule of not receiving pain medications from other Healthcare Providers. 1. Exception #1 (Emergencies): In the event of an emergency (i.e.: accident requiring emergency care), you are allowed to receive additional pain medication. However, you are responsible for: As soon as you are able, call our office [phone], at any time of the day or night, and leave a message stating your name, the  date and nature of the emergency, and the name and dose of the medication   prescribed. In the event that your call is answered by a member of our staff, make sure to document and save the date, time, and the name of the person that took your information.  2. Exception #2 (Planned Surgery): In the event that you are scheduled by another doctor or dentist to have any type of surgery or procedure, you are allowed (for a period no longer than 30 days), to receive additional pain medication, for the acute post-op pain. However, in this case, you are responsible for picking up a copy of our "Post-op Pain Management for Surgeons" handout, and giving it to your surgeon or dentist. This document is available at our office, and does not require an appointment to obtain it. Simply go to our office during business hours (Monday-Thursday from 8:00 AM to 4:00 PM) (Friday 8:00 AM to 12:00 Noon) or if you have a scheduled appointment with [place], prior to your surgery, and ask for it by name. In addition, you will need to provide [place] with your name, name of your surgeon, type of surgery, and date of procedure or surgery.  *Opioid medications include: morphine, codeine, oxycodone, oxymorphone, hydrocodone, hydromorphone, meperidine, tramadol, tapentadol, buprenorphine, fentanyl, methadone. **Benzodiazepine medications include: diazepam (Valium), alprazolam (Xanax), clonazepam (Klonopine), lorazepam (Ativan), clorazepate (Tranxene), chlordiazepoxide (Librium), estazolam (Prosom), oxazepam (Serax), temazepam (Restoril), triazolam (Halcion) (Last updated: 06/07/2017) ____________________________________________________________________________________________   BMI Assessment: Estimated body mass index is 32.69 kg/m as calculated from the following:   Height as of this encounter: 5\' 8"  (1.727 m).   Weight as of this encounter: 215 lb (97.5 kg).  BMI interpretation table: BMI level Category Range association with higher  incidence of chronic pain  <18 kg/m2 Underweight   18.5-24.9 kg/m2 Ideal body weight   25-29.9 kg/m2 Overweight Increased incidence by 20%  30-34.9 kg/m2 Obese (Class I) Increased incidence by 68%  35-39.9 kg/m2 Severe obesity (Class II) Increased incidence by 136%  >40 kg/m2 Extreme obesity (Class III) Increased incidence by 254%   Patient's current BMI Ideal Body weight  Body mass index is 32.69 kg/m. Ideal body weight: 68.4 kg (150 lb 12.7 oz) Adjusted ideal body weight: 80 kg (176 lb 7.6 oz)   BMI Readings from Last 4 Encounters:  02/25/18 32.69 kg/m  02/06/18 32.73 kg/m  01/31/18 32.66 kg/m  01/09/18 32.69 kg/m   Wt Readings from Last 4 Encounters:  02/25/18 215 lb (97.5 kg)  02/06/18 215 lb 4.5 oz (97.7 kg)  01/31/18 214 lb 12.8 oz (97.4 kg)  01/09/18 214 lb 15.9 oz (97.5 kg)

## 2018-02-25 NOTE — Telephone Encounter (Signed)
Patient notified and voiced understanding.

## 2018-02-27 ENCOUNTER — Ambulatory Visit
Admission: RE | Admit: 2018-02-27 | Discharge: 2018-02-27 | Disposition: A | Discharge status: Home / Self Care | Payer: PPO | Source: Ambulatory Visit | Attending: Student | Admitting: Student

## 2018-02-27 DIAGNOSIS — M47892 Other spondylosis, cervical region: Secondary | ICD-10-CM | POA: Diagnosis not present

## 2018-02-27 DIAGNOSIS — M4802 Spinal stenosis, cervical region: Secondary | ICD-10-CM

## 2018-02-27 DIAGNOSIS — M50222 Other cervical disc displacement at C5-C6 level: Secondary | ICD-10-CM | POA: Diagnosis not present

## 2018-02-28 ENCOUNTER — Ambulatory Visit: Admission: RE | Admit: 2018-02-28 | Payer: Non-veteran care | Source: Ambulatory Visit

## 2018-02-28 ENCOUNTER — Other Ambulatory Visit: Payer: Self-pay | Admitting: *Deleted

## 2018-02-28 DIAGNOSIS — G959 Disease of spinal cord, unspecified: Secondary | ICD-10-CM | POA: Diagnosis not present

## 2018-02-28 DIAGNOSIS — M542 Cervicalgia: Secondary | ICD-10-CM | POA: Diagnosis not present

## 2018-02-28 DIAGNOSIS — M4802 Spinal stenosis, cervical region: Secondary | ICD-10-CM | POA: Diagnosis not present

## 2018-02-28 DIAGNOSIS — C61 Malignant neoplasm of prostate: Secondary | ICD-10-CM

## 2018-02-28 NOTE — Progress Notes (Signed)
MRI

## 2018-03-01 LAB — TOXASSURE SELECT 13 (MW), URINE

## 2018-03-06 DIAGNOSIS — M7712 Lateral epicondylitis, left elbow: Secondary | ICD-10-CM | POA: Diagnosis not present

## 2018-03-06 DIAGNOSIS — M25512 Pain in left shoulder: Secondary | ICD-10-CM | POA: Diagnosis not present

## 2018-03-06 DIAGNOSIS — M19012 Primary osteoarthritis, left shoulder: Secondary | ICD-10-CM | POA: Diagnosis not present

## 2018-03-06 DIAGNOSIS — M47812 Spondylosis without myelopathy or radiculopathy, cervical region: Secondary | ICD-10-CM | POA: Diagnosis not present

## 2018-03-06 DIAGNOSIS — M25522 Pain in left elbow: Secondary | ICD-10-CM | POA: Diagnosis not present

## 2018-03-06 DIAGNOSIS — M7542 Impingement syndrome of left shoulder: Secondary | ICD-10-CM | POA: Diagnosis not present

## 2018-03-06 DIAGNOSIS — M67912 Unspecified disorder of synovium and tendon, left shoulder: Secondary | ICD-10-CM | POA: Diagnosis not present

## 2018-03-06 DIAGNOSIS — G8929 Other chronic pain: Secondary | ICD-10-CM | POA: Diagnosis not present

## 2018-03-06 NOTE — Progress Notes (Signed)
Prescribed percocet negative Norco

## 2018-03-11 DIAGNOSIS — C61 Malignant neoplasm of prostate: Secondary | ICD-10-CM | POA: Diagnosis not present

## 2018-03-11 DIAGNOSIS — E118 Type 2 diabetes mellitus with unspecified complications: Secondary | ICD-10-CM | POA: Diagnosis not present

## 2018-03-11 DIAGNOSIS — R739 Hyperglycemia, unspecified: Secondary | ICD-10-CM | POA: Diagnosis not present

## 2018-03-11 DIAGNOSIS — G4733 Obstructive sleep apnea (adult) (pediatric): Secondary | ICD-10-CM | POA: Diagnosis not present

## 2018-03-11 DIAGNOSIS — I25118 Atherosclerotic heart disease of native coronary artery with other forms of angina pectoris: Secondary | ICD-10-CM | POA: Diagnosis not present

## 2018-03-11 DIAGNOSIS — E538 Deficiency of other specified B group vitamins: Secondary | ICD-10-CM | POA: Insufficient documentation

## 2018-03-11 DIAGNOSIS — Z0000 Encounter for general adult medical examination without abnormal findings: Secondary | ICD-10-CM | POA: Diagnosis not present

## 2018-03-11 DIAGNOSIS — F325 Major depressive disorder, single episode, in full remission: Secondary | ICD-10-CM | POA: Diagnosis not present

## 2018-03-11 DIAGNOSIS — I10 Essential (primary) hypertension: Secondary | ICD-10-CM | POA: Diagnosis not present

## 2018-03-18 DIAGNOSIS — E538 Deficiency of other specified B group vitamins: Secondary | ICD-10-CM | POA: Diagnosis not present

## 2018-03-19 ENCOUNTER — Ambulatory Visit
Admission: RE | Admit: 2018-03-19 | Discharge: 2018-03-19 | Disposition: A | Discharge status: Home / Self Care | Payer: PPO | Source: Ambulatory Visit | Attending: Radiation Oncology | Admitting: Radiation Oncology

## 2018-03-19 DIAGNOSIS — M5126 Other intervertebral disc displacement, lumbar region: Secondary | ICD-10-CM | POA: Diagnosis not present

## 2018-03-19 DIAGNOSIS — M5127 Other intervertebral disc displacement, lumbosacral region: Secondary | ICD-10-CM | POA: Insufficient documentation

## 2018-03-19 DIAGNOSIS — C61 Malignant neoplasm of prostate: Secondary | ICD-10-CM | POA: Diagnosis not present

## 2018-03-19 DIAGNOSIS — M48061 Spinal stenosis, lumbar region without neurogenic claudication: Secondary | ICD-10-CM | POA: Insufficient documentation

## 2018-03-19 MED ORDER — GADOBUTROL 1 MMOL/ML IV SOLN
10.0000 mL | Freq: Once | INTRAVENOUS | Status: AC | PRN
  Administered 2018-03-19: 10 mL via INTRAVENOUS

## 2018-03-25 ENCOUNTER — Ambulatory Visit
Admission: RE | Admit: 2018-03-25 | Discharge: 2018-03-25 | Disposition: A | Discharge status: Home / Self Care | Payer: PPO | Source: Ambulatory Visit | Attending: Radiation Oncology | Admitting: Radiation Oncology

## 2018-03-25 DIAGNOSIS — Z510 Encounter for antineoplastic radiation therapy: Secondary | ICD-10-CM | POA: Insufficient documentation

## 2018-03-25 DIAGNOSIS — C61 Malignant neoplasm of prostate: Secondary | ICD-10-CM | POA: Diagnosis not present

## 2018-03-26 ENCOUNTER — Ambulatory Visit: Payer: PPO | Admitting: Urology

## 2018-03-27 ENCOUNTER — Encounter: Payer: Self-pay | Admitting: Urology

## 2018-03-27 ENCOUNTER — Ambulatory Visit (INDEPENDENT_AMBULATORY_CARE_PROVIDER_SITE_OTHER): Payer: PPO | Admitting: Urology

## 2018-03-27 VITALS — BP 132/73 | HR 102 | Ht 68.0 in | Wt 217.4 lb

## 2018-03-27 DIAGNOSIS — C61 Malignant neoplasm of prostate: Secondary | ICD-10-CM | POA: Diagnosis not present

## 2018-03-27 DIAGNOSIS — Z510 Encounter for antineoplastic radiation therapy: Secondary | ICD-10-CM | POA: Diagnosis not present

## 2018-03-27 MED ORDER — DEGARELIX ACETATE 120 MG ~~LOC~~ SOLR
240.0000 mg | Freq: Once | SUBCUTANEOUS | Status: AC
  Administered 2018-03-27: 240 mg via SUBCUTANEOUS

## 2018-03-27 NOTE — Progress Notes (Signed)
03/27/2018 6:17 PM   Gary [name] 02-26-1951 [id number]  Referring provider: Kirk Ruths, MD [facility] [place], [place] [address]  Chief Complaint  Patient presents with  . Prostate Cancer   HPI: Patient is a 67 year old Caucasian male with prostate cancer, BPH with LU TS who presents today to start androgen deprivation therapy with Mills Koller for a month.   He reports of doing well and has not reported of any urinary symptoms. He reports of pain in the legs, arms and shoulders.   Prostate Cancer  Prostate MRI from 12/09/2017 was remarkable for a 11 g prostate. There was a PI-RADS 3 lesion in the left mid gland rating 6 mm. There was no evidence of pelvic adenopathy or extracapsular/seminal vesicle involvement.   He had undergone MR fusion biopsy in [place] on 10/17 /2019 where the prostate volume was calculated to be 19 g. Standard 12 core biopsies were performed in addition to 3 biopsies on the region of interest.  Pathology: 12/15 cores positive for adenocarcinoma the prostate.  All left-sided cores including the ROI biopsies were positive for Gleason 4+3/3+3 adenocarcinoma.  One core showed Gleason 3+3 adenocarcinoma.  Refer to the scanned pathology report for details.  His bone scan from 02/19/2018 showed degenerative changes. There were areas of increased uptake in the L3 vertebral body and ribs. MR of lumbar spine from 03/20/2018 showed no evidence of metastatic disease.   He will receive his initial injection of Firmagon for antigen deprivation therapy.    PMH: Past Medical History:  Diagnosis Date  . Anxiety   . Benign essential HTN 06/24/2015   Last Assessment & Plan:  Taking medications without noted side effects or dizziness.   Overview:  Last Assessment & Plan:  Is compliant with hypertensive medications without clear side effects or lack of control.    Marland Kitchen BPH (benign prostatic hyperplasia)   . COPD (chronic obstructive  pulmonary disease) ([place])   . Coronary artery disease 02/16/2014   Overview:  Minimal disease by cath 12/13  Last Assessment & Plan:  Seems to be tolerating medical regimen without significant side effects and symptoms such as worsening chest pain are not noted.   Overview:  Overview:  Minimal disease by cath 12/13  Last Assessment & Plan:  Seems to be tolerating medical regimen without significant side effects and symptoms such as worsening chest pain are not no  . DDD (degenerative disc disease), cervical   . Degenerative disc disease, cervical 01/22/2017  . Depression   . DJD (degenerative joint disease)   . Emphysema of lung ([place])   . Heel pain 10/10/2017  . Hyperglobulinemia   . Lumbar degenerative disc disease 01/22/2017  . Major depression in remission ([place]) 03/10/2014   Last Assessment & Plan:  Mood is doing well on meds Overview:  Last Assessment & Plan:  Mood is doing well on meds   . Migraines   . Mitral regurgitation   . Mixed hyperlipidemia 02/16/2014   Last Assessment & Plan:  Diet for healthy cholesterol is being attempted and no clear myalgia's or other side effects are noted.   Overview:  Last Assessment & Plan:  Low fat diet is being attempted and no significant side effects such as myalgia's are noted.    . Neck pain 01/24/2017  . OSA (obstructive sleep apnea) 07/05/2016   Last Assessment & Plan:  Continues to use cpap consistently and is continuing to benefit from it's use.    Marland Kitchen  Osteoarthritis of knee 08/12/2014   Overview:  Right knee is worse now  Last Assessment & Plan:  Diffuse pain on back in hips and knees also. Went to a pain clinic  Overview:  Overview:  Right knee is worse now  Last Assessment & Plan:  Diffuse pain on back in hips and knees also. Went to a pain clinic   . RA (rheumatoid arthritis) ([place])   . Spinal stenosis of lumbar region with neurogenic claudication 04/18/2016  . Spondylosis without myelopathy or radiculopathy, lumbar region 01/22/2017  . Venous  insufficiency of both lower extremities 11/07/2016    Surgical History: Past Surgical History:  Procedure Laterality Date  . CARDIAC CATHETERIZATION    . CHOLECYSTECTOMY    . COLONOSCOPY    . COLONOSCOPY WITH PROPOFOL N/A 01/09/2018   Procedure: COLONOSCOPY WITH PROPOFOL;  Surgeon: Toledo, Benay Pike, MD;  Location: [facility] ENDOSCOPY;  Service: Gastroenterology;  Laterality: N/A;  . deviated septum repair    . ESOPHAGOGASTRODUODENOSCOPY (EGD) WITH PROPOFOL N/A 01/09/2018   Procedure: ESOPHAGOGASTRODUODENOSCOPY (EGD) WITH PROPOFOL;  Surgeon: Toledo, Benay Pike, MD;  Location: [facility] ENDOSCOPY;  Service: Gastroenterology;  Laterality: N/A;  . KNEE SURGERY Left   . NASAL SEPTUM SURGERY      Home Medications:  Allergies as of 03/27/2018      Reactions   Niacin-lovastatin Er Other (See Comments)   unknown unknown   Atorvastatin Other (See Comments)   Simvastatin Other (See Comments)   Propoxyphene Nausea Only   Passed out after taking it on an empty stomach      Medication List       Accurate as of March 27, 2018 11:59 PM. Always use your most recent med list.        acetaminophen 500 MG tablet Commonly known as:  TYLENOL Take as needed by mouth.   Albuterol Sulfate 108 (90 Base) MCG/ACT Aepb Inhale as needed into the lungs.   aspirin 81 MG tablet Take by mouth.   buPROPion 300 MG 24 hr tablet Commonly known as:  WELLBUTRIN XL Take by mouth.   cholecalciferol 25 MCG (1000 UT) tablet Commonly known as:  VITAMIN D Take by mouth.   clobetasol ointment 0.05 % Commonly known as:  TEMOVATE Apply 1 application topically 2 (two) times daily.   clotrimazole-betamethasone cream Commonly known as:  LOTRISONE Apply 1 application topically 2 (two) times daily.   diclofenac sodium 1 % Gel Commonly known as:  VOLTAREN Apply topically 4 (four) times daily.   DULoxetine 60 MG capsule Commonly known as:  CYMBALTA Take 1 capsule (60 mg total) by mouth daily.   esomeprazole 40  MG capsule Commonly known as:  NEXIUM Take by mouth.   ezetimibe 10 MG tablet Commonly known as:  ZETIA   fenofibrate 48 MG tablet Commonly known as:  TRICOR Take 48 mg daily by mouth.   finasteride 5 MG tablet Commonly known as:  PROSCAR Take 5 mg daily by mouth.   gabapentin 300 MG capsule Commonly known as:  NEURONTIN Take 2 capsules (600 mg total) by mouth at bedtime.   hydrochlorothiazide 25 MG tablet Commonly known as:  HYDRODIURIL   ibuprofen 200 MG tablet Commonly known as:  ADVIL,MOTRIN Take 200 mg by mouth every 6 (six) hours as needed.   meloxicam 15 MG tablet Commonly known as:  MOBIC Take 15 mg by mouth daily.   methocarbamol 500 MG tablet Commonly known as:  ROBAXIN Take 500 mg by mouth 2 (two) times daily as needed for  muscle spasms.   mupirocin ointment 2 % Commonly known as:  BACTROBAN Place 1 application into the nose 2 (two) times daily.   niacin-lovastatin 1000-20 MG 24 hr tablet Commonly known as:  ADVICOR Take by mouth.   oxyCODONE-acetaminophen 10-325 MG tablet Commonly known as:  PERCOCET Take 1 tablet by mouth every 8 (eight) hours as needed for pain.   oxyCODONE-acetaminophen 10-325 MG tablet Commonly known as:  PERCOCET Take 1 tablet by mouth every 8 (eight) hours as needed for pain. Start taking on:  April 26, 2018   pravastatin 20 MG tablet Commonly known as:  PRAVACHOL Take by mouth.   sildenafil 20 MG tablet Commonly known as:  REVATIO Take 3 to 5 tablets two hours before intercouse on an empty stomach.  Do not take with nitrates.       Allergies:  Allergies  Allergen Reactions  . Niacin-Lovastatin Er Other (See Comments)    unknown unknown  . Atorvastatin Other (See Comments)  . Simvastatin Other (See Comments)  . Propoxyphene Nausea Only    Passed out after taking it on an empty stomach    Family History: Family History  Problem Relation Age of Onset  . Heart disease Mother     Social History:  reports  that he quit smoking about 5 years ago. He has never used smokeless tobacco. He reports that he does not drink alcohol or use drugs.  ROS: UROLOGY Frequent Urination?: Yes Hard to postpone urination?: No Burning/pain with urination?: No Get up at night to urinate?: Yes Leakage of urine?: Yes Urine stream starts and stops?: No Trouble starting stream?: No Do you have to strain to urinate?: No Blood in urine?: No Urinary tract infection?: No Sexually transmitted disease?: No Injury to kidneys or bladder?: No Painful intercourse?: No Weak stream?: No Erection problems?: No Penile pain?: No  Gastrointestinal Nausea?: No Vomiting?: Yes Indigestion/heartburn?: No Diarrhea?: Yes Constipation?: No  Constitutional Fever: No Night sweats?: No Weight loss?: No Fatigue?: No  Skin Skin rash/lesions?: No Itching?: No  Eyes Blurred vision?: No Double vision?: No  Ears/Nose/Throat Sore throat?: No Sinus problems?: No  Hematologic/Lymphatic Swollen glands?: No Easy bruising?: No  Cardiovascular Leg swelling?: No Chest pain?: No  Respiratory Cough?: No Shortness of breath?: No  Endocrine Excessive thirst?: Yes  Musculoskeletal Back pain?: Yes Joint pain?: Yes  Neurological Headaches?: Yes Dizziness?: Yes  Psychologic Depression?: Yes Anxiety?: Yes  Physical Exam: BP 132/73 (BP Location: Left Arm, Patient Position: Sitting, Cuff Size: Large)   Pulse (!) 102   Ht 5\' 8"  (1.727 m)   Wt 217 lb 6.4 oz (98.6 kg)   BMI 33.06 kg/m   Constitutional: Well nourished. Alert and oriented, No acute distress. HEENT: [place] AT, moist mucus membranes. Trachea midline, no masses. Cardiovascular: No clubbing, cyanosis, or edema. Respiratory: Normal respiratory effort, no increased work of breathing. Skin: No rashes, bruises or suspicious lesions. Neurologic: Grossly intact, no focal deficits, moving all 4 extremities. Psychiatric: Normal mood and affect.  Pertinent  Imaging: CLINICAL DATA:  Newly diagnosed prostate cancer. Abnormal uptake at L3 on a nuclear medicine bone scan. Chronic low back pain.  EXAM: MRI LUMBAR SPINE WITHOUT AND WITH CONTRAST  TECHNIQUE: Multiplanar and multiecho pulse sequences of the lumbar spine were obtained without and with intravenous contrast.  CONTRAST:  10 mL Gadavist  COMPARISON:  Bone scan 02/19/2018.  Lumbar spine MRI 02/05/2015.  FINDINGS: Segmentation:  Standard.  Alignment: Unchanged trace retrolisthesis of L1 on L2, 5 mm retrolisthesis of L2  on L3, and 3 mm anterolisthesis of L4 on L5.  Vertebrae: Mild chronic diffuse bone marrow heterogeneity, similar to the prior MRI. No suspicious enhancing or destructive lesion. Mild degenerative endplate edema at G6-6. Small L3 superior endplate Schmorl's node.  Conus medullaris and cauda equina: Conus extends to the upper L2 level. Conus and cauda equina appear normal.  Paraspinal and other soft tissues: Unremarkable.  Disc levels:  Disc desiccation throughout the lumbar spine. Chronic severe disc space narrowing at L2-3 with mild narrowing at L1-2 and L4-5.  T12-L1: Increased, mild disc bulging without stenosis.  L1-2: Disc bulging asymmetric to the left and mild facet hypertrophy result in mild left lateral recess and mild left neural foraminal stenosis without significant spinal stenosis, similar to prior. There is a left extraforaminal disc protrusion which is larger and more focal than on the prior study and may affect the left L1 nerve.  L2-3: Circumferential disc bulging, endplate spurring, disc space height loss, and moderate facet and ligamentum flavum hypertrophy result in severe spinal stenosis, bilateral lateral recess stenosis, and moderate right and mild left neural foraminal stenosis, not significantly changed. Potential bilateral L3 nerve root impingement.  L3-4: Circumferential disc bulging and moderate facet and  ligamentum flavum hypertrophy result in moderate spinal stenosis, mild bilateral lateral recess stenosis, and moderate right and mild left neural foraminal stenosis, mildly progressed from prior.  L4-5: Anterolisthesis with mild bulging of uncovered disc, ligamentum flavum hypertrophy, and severe left greater than right facet hypertrophy result in severe spinal stenosis, moderate left greater than right lateral recess stenosis, and mild left greater than right neural foraminal stenosis, unchanged.  L5-S1: Mild disc bulging, a new left foraminal to extraforaminal disc protrusion, and severe facet hypertrophy without stenosis result in new moderate left neural foraminal stenosis and potential left L5 nerve root impingement. No spinal stenosis.  IMPRESSION: 1. No evidence of metastatic disease in the lumbar spine. 2. New left foraminal/extraforaminal disc protrusion at L5-S1 with moderate left neural foraminal stenosis and potential L5 nerve root impingement. 3. Larger/more focal left extraforaminal disc protrusion at L1-2 potentially affecting the L1 nerve. 4. Progressive, moderate spinal stenosis and right greater than left neural foraminal stenosis at L3-4. 5. Unchanged severe spinal stenosis at L2-3 and L4-5.   Electronically Signed   By: Logan Bores M.D.   On: 03/20/2018 09:47  CLINICAL DATA:  Prostate cancer, high risk, staging, LEFT shoulder pain, history of LEFT knee replacement, rheumatoid arthritis, osteoarthritis, degenerative disc disease  EXAM: NUCLEAR MEDICINE WHOLE BODY BONE SCAN  TECHNIQUE: Whole body anterior and posterior images were obtained approximately 3 hours after intravenous injection of radiopharmaceutical.  RADIOPHARMACEUTICALS:  22.84 mCi Technetium-81m MDP IV  COMPARISON:  None  Correlation: MRI LEFT shoulder 02/08/2018, MR cervical spine 02/15/2018, CT chest 09/01/2008  FINDINGS: Uptake at the shoulders, sternoclavicular  joints, LEFT elbow, BILATERAL wrists, knees, and feet, typically degenerative or through the.  Abnormal increased tracer localization at L3, posterior LEFT tenth rib medially, and questionably the anterior LEFT first rib cannot exclude metastases.  Uptake in the cervical spine is laterally positioned and likely degenerative.  Uptake in the lower lumbar spine LEFT lateral aspect is likely degenerative.  Photopenic defect at the LEFT knee favoring unicompartmental prosthesis.  Minimal increased tracer localization at several adjacent posterior RIGHT ribs suggesting old fractures, corresponding to old fractures on a remote CT.  Tracer accumulation at the cervical region anteriorly could potentially be within ossified laryngeal cartilage.  Retained tracer within bladder question outflow obstruction.  Soft tissue distribution of tracer otherwise unremarkable.  IMPRESSION: Abnormal increased tracer localization at L3 vertebral body, posterior LEFT tenth rib medially, and question anterior LEFT first rib cannot exclude osseous metastases.  Extensive degenerative type uptake at multiple sites as above.  Significant retained tracer within urinary bladder question outlet obstruction.   Electronically Signed   By: Lavonia Dana M.D.   On: 02/19/2018 16:55  I have reviewed both scans personally and explained to Mr. Schnelle that the pain he is experiencing is likely MSK in nature and not due to his prostate cancer.   Assessment & Plan:    1. Prostate Cancer (hight volume T2b intermediate risk (unfavorable) adenocarcinoma of the prostate -Start androgen deprivation therapy with Mills Koller for 1 month; if PSA and testosterone is normal switch to Lupron injections -We did discuss the risks of ADT therapy, such as: weight gain, ED, decrease in libido, fatigue, hot flashes, back pain, joint pain, chills, constipation, heart disease, itching where the injection is given and  pain where the injection is given.  We also discussed bone health and I have advised the patient to start Calcium 600 gm twice daily and Vitamin D 200 mg twice daily -RTC in one month for possible switch to 6 month Lupron pending PSA and testosterone level   Return in about 1 month (around 04/27/2018) for Firmagon/Lupron pending PSA and testosterone results .  These notes generated with voice recognition software. I apologize for typographical errors.  Zara Council, PA-C  [facility] [address]  [place], [place] [address] [phone]  I, Lucas Mallow, am acting as a Education administrator for [employer],  I have reviewed the above documentation for accuracy and completeness, and I agree with the above.    Zara Council, PA-C

## 2018-03-27 NOTE — Progress Notes (Signed)
Firmagon Sub Q Injection  Due to Prostate Cancer patient is present today for a Firmagon Injection.   Medication: Mills Koller (Degarelix)  Dose: 240mg  Location: right, left upper abdomen Lot: [id number] Exp: 05/2020  Patient tolerated well, no complications were noted  Performed by: Elberta Leatherwood, CMA  Follow up: 1 month for Lupron

## 2018-03-29 ENCOUNTER — Other Ambulatory Visit: Payer: Self-pay | Admitting: *Deleted

## 2018-03-29 DIAGNOSIS — C61 Malignant neoplasm of prostate: Secondary | ICD-10-CM

## 2018-04-04 ENCOUNTER — Ambulatory Visit
Admission: RE | Admit: 2018-04-04 | Discharge: 2018-04-04 | Disposition: A | Discharge status: Home / Self Care | Payer: PPO | Source: Ambulatory Visit | Attending: Radiation Oncology | Admitting: Radiation Oncology

## 2018-04-04 ENCOUNTER — Telehealth: Payer: Self-pay

## 2018-04-04 NOTE — Telephone Encounter (Signed)
Patient called and is having pain in the right hip that is going down the leg on the right and left.  He is having trouble laying on the right side and also getting in and out of car is very painful.  Bowel movements are very painful causing pain on the right side. I explained that Dr Holley Raring is not currently in the office and will not be back until January 6 and that I would be glad to put him on his schedule.  He has an appt with Dr Oretha Caprice on January 2, 20 to address this problem.  He will let us know if he needs addotional help for this problem.

## 2018-04-04 NOTE — Telephone Encounter (Signed)
Please call patient

## 2018-04-05 ENCOUNTER — Other Ambulatory Visit: Payer: Self-pay | Admitting: Radiology

## 2018-04-05 DIAGNOSIS — C61 Malignant neoplasm of prostate: Secondary | ICD-10-CM

## 2018-04-08 ENCOUNTER — Ambulatory Visit
Admission: RE | Admit: 2018-04-08 | Discharge: 2018-04-08 | Disposition: A | Discharge status: Home / Self Care | Payer: PPO | Source: Ambulatory Visit | Attending: Radiation Oncology | Admitting: Radiation Oncology

## 2018-04-08 DIAGNOSIS — C61 Malignant neoplasm of prostate: Secondary | ICD-10-CM | POA: Diagnosis not present

## 2018-04-08 DIAGNOSIS — Z510 Encounter for antineoplastic radiation therapy: Secondary | ICD-10-CM | POA: Diagnosis not present

## 2018-04-09 ENCOUNTER — Ambulatory Visit
Admission: RE | Admit: 2018-04-09 | Discharge: 2018-04-09 | Disposition: A | Discharge status: Home / Self Care | Payer: PPO | Source: Ambulatory Visit | Attending: Radiation Oncology | Admitting: Radiation Oncology

## 2018-04-09 DIAGNOSIS — Z510 Encounter for antineoplastic radiation therapy: Secondary | ICD-10-CM | POA: Diagnosis not present

## 2018-04-09 DIAGNOSIS — C61 Malignant neoplasm of prostate: Secondary | ICD-10-CM | POA: Diagnosis not present

## 2018-04-11 ENCOUNTER — Ambulatory Visit
Admission: RE | Admit: 2018-04-11 | Discharge: 2018-04-11 | Disposition: A | Discharge status: Home / Self Care | Payer: PPO | Source: Ambulatory Visit | Attending: Radiation Oncology | Admitting: Radiation Oncology

## 2018-04-11 DIAGNOSIS — Z510 Encounter for antineoplastic radiation therapy: Secondary | ICD-10-CM | POA: Insufficient documentation

## 2018-04-11 DIAGNOSIS — M48062 Spinal stenosis, lumbar region with neurogenic claudication: Secondary | ICD-10-CM | POA: Diagnosis not present

## 2018-04-11 DIAGNOSIS — C61 Malignant neoplasm of prostate: Secondary | ICD-10-CM

## 2018-04-12 ENCOUNTER — Ambulatory Visit: Payer: PPO

## 2018-04-15 ENCOUNTER — Ambulatory Visit
Admission: RE | Admit: 2018-04-15 | Discharge: 2018-04-15 | Disposition: A | Discharge status: Home / Self Care | Payer: PPO | Source: Ambulatory Visit | Attending: Radiation Oncology | Admitting: Radiation Oncology

## 2018-04-15 DIAGNOSIS — C61 Malignant neoplasm of prostate: Secondary | ICD-10-CM | POA: Diagnosis not present

## 2018-04-16 ENCOUNTER — Telehealth: Payer: Self-pay | Admitting: *Deleted

## 2018-04-16 ENCOUNTER — Ambulatory Visit
Admission: RE | Admit: 2018-04-16 | Discharge: 2018-04-16 | Disposition: A | Discharge status: Home / Self Care | Payer: PPO | Source: Ambulatory Visit | Attending: Radiation Oncology | Admitting: Radiation Oncology

## 2018-04-16 DIAGNOSIS — C61 Malignant neoplasm of prostate: Secondary | ICD-10-CM | POA: Diagnosis not present

## 2018-04-16 NOTE — Telephone Encounter (Signed)
Patient notified

## 2018-04-16 NOTE — Telephone Encounter (Signed)
Pain in right, going below buttocks, down back of right leg, groin.

## 2018-04-17 ENCOUNTER — Ambulatory Visit: Payer: PPO | Attending: Nurse Practitioner | Admitting: Nurse Practitioner

## 2018-04-17 ENCOUNTER — Encounter: Payer: Self-pay | Admitting: Nurse Practitioner

## 2018-04-17 ENCOUNTER — Ambulatory Visit
Admission: RE | Admit: 2018-04-17 | Discharge: 2018-04-17 | Disposition: A | Discharge status: Home / Self Care | Payer: PPO | Source: Ambulatory Visit | Attending: Radiation Oncology | Admitting: Radiation Oncology

## 2018-04-17 VITALS — BP 124/68 | HR 94 | Temp 97.9°F | Resp 16 | Ht 68.0 in | Wt 217.0 lb

## 2018-04-17 DIAGNOSIS — C61 Malignant neoplasm of prostate: Secondary | ICD-10-CM | POA: Diagnosis not present

## 2018-04-17 DIAGNOSIS — M533 Sacrococcygeal disorders, not elsewhere classified: Secondary | ICD-10-CM | POA: Insufficient documentation

## 2018-04-17 DIAGNOSIS — M47816 Spondylosis without myelopathy or radiculopathy, lumbar region: Secondary | ICD-10-CM | POA: Diagnosis not present

## 2018-04-17 DIAGNOSIS — R1031 Right lower quadrant pain: Secondary | ICD-10-CM | POA: Diagnosis not present

## 2018-04-17 DIAGNOSIS — G8929 Other chronic pain: Secondary | ICD-10-CM | POA: Insufficient documentation

## 2018-04-17 DIAGNOSIS — G894 Chronic pain syndrome: Secondary | ICD-10-CM

## 2018-04-17 MED ORDER — KETOROLAC TROMETHAMINE 60 MG/2ML IM SOLN
60.0000 mg | Freq: Once | INTRAMUSCULAR | Status: AC
  Administered 2018-04-17: 60 mg via INTRAMUSCULAR
  Filled 2018-04-17: qty 2

## 2018-04-17 MED ORDER — ORPHENADRINE CITRATE 30 MG/ML IJ SOLN
60.0000 mg | Freq: Once | INTRAMUSCULAR | Status: AC
  Administered 2018-04-17: 60 mg via INTRAMUSCULAR
  Filled 2018-04-17: qty 2

## 2018-04-17 NOTE — Progress Notes (Signed)
Nursing Pain Medication Assessment:  Safety precautions to be maintained throughout the outpatient stay will include: orient to surroundings, keep bed in low position, maintain call bell within reach at all times, provide assistance with transfer out of bed and ambulation.  Medication Inspection Compliance: Pill count conducted under aseptic conditions, in front of the patient. Neither the pills nor the bottle was removed from the patient's sight at any time. Once count was completed pills were immediately returned to the patient in their original bottle.  Medication: Oxycodone/APAP Pill/Patch Count: 5 of 90 pills remain Pill/Patch Appearance: Markings consistent with prescribed medication Bottle Appearance: Standard pharmacy container. Clearly labeled. Filled Date: 73 / 18 / 2019 Last Medication intake:  Today

## 2018-04-17 NOTE — Progress Notes (Signed)
Patient's Name: Gary [name]  MRN: [id number]  Referring Provider: Kirk Ruths, MD  DOB: 1950-10-14  PCP: Kirk Ruths, MD  DOS: 04/17/2018  Note by: Vevelyn Francois NP  Service setting: Ambulatory outpatient  Specialty: Interventional Pain Management  Location: [facility] (AMB) Pain Management Facility    Patient type: Established    Primary Reason(s) for Visit: Evaluation of chronic illnesses with exacerbation, or progression (Level of risk: moderate) CC: Back Pain (lower right ) and Shoulder Pain (left)  HPI  [name] is a 68 y.o. year old, male patient, who comes today for a follow-up evaluation. He has Spondylosis without myelopathy or radiculopathy, lumbar region; Lumbar degenerative disc disease; Degenerative disc disease, cervical; Chronic pain syndrome; Facet arthropathy, lumbar; Benign essential HTN; Chronic hyperglycemia; Osteoarthritis of knee; Coronary artery disease; Health care maintenance; Heel pain; Localized, primary osteoarthritis; Low back pain; Major depression in remission ([place]); Mixed hyperlipidemia; Neck pain; OSA (obstructive sleep apnea); Pain medication agreement signed; Spinal stenosis of lumbar region with neurogenic claudication; Venous insufficiency of both lower extremities; DDD (degenerative disc disease), cervical; Other intervertebral disc degeneration, lumbar region; Chronic bilateral low back pain with left-sided sciatica; Chronic left sacroiliac joint pain; Chronic hip pain, left; Cervical spondylosis with radiculopathy; B12 deficiency; Prostate cancer ([place]); Chronic sacroiliac joint pain (R>L); and Groin pain, chronic, right on their problem list. [name] was last seen on 02/25/2018. His primarily concern today is the Back Pain (lower right ) and Shoulder Pain (left)  Pain Assessment: Location: Lower, Right Back Radiating: into right hip and around to the right groin  Onset: More than a month ago Duration: Chronic pain Quality: Constant,  Discomfort, Stabbing, Radiating(states it is like someone taking and twising from his back through his hip.  ) Severity: 9 /10 (subjective, self-reported pain score)  Note: Reported level is compatible with observation. Clinically the patient looks like a 3/10 A 3/10 is viewed as "Moderate" and described as significantly interfering with activities of daily living (ADL). It becomes difficult to feed, bathe, get dressed, get on and off the toilet or to perform personal hygiene functions. Difficult to get in and out of bed or a chair without assistance. Very distracting. With effort, it can be ignored when deeply involved in activities.       When using our objective Pain Scale, levels between 6 and 10/10 are said to belong in an emergency room, as it progressively worsens from a 6/10, described as severely limiting, requiring emergency care not usually available at an outpatient pain management facility. At a 6/10 level, communication becomes difficult and requires great effort. Assistance to reach the emergency department may be required. Facial flushing and profuse sweating along with potentially dangerous increases in heart rate and blood pressure will be evident. Effect on ADL: sleep disruption.  changes gait.  very limiting.  Timing: Constant Modifying factors: heat, ice  BP: 124/68  HR: 94  Further details on both, my assessment(s), as well as the proposed treatment plan, please see below.  He admits that he is having a flareup today.  He would like some relief for his current low back and groin pain.  He was recently diagnosed with prostate cancer has had a few treatments of radiation.  He admits that he has overtaking his medication.  He states he will not be sitting around in pain.  He admits that he continues to have neck and shoulder pain.  He has been followed by neurosurgery however any additional treatment is on  hold secondary to his current cancer diagnosis.  He did have a shoulder injection.    Laboratory Chemistry  Inflammation Markers (CRP: Acute Phase) (ESR: Chronic Phase) No results found for: CRP, ESRSEDRATE, LATICACIDVEN                       Rheumatology Markers No results found for: RF, ANA, LABURIC, URICUR, LYMEIGGIGMAB, LYMEABIGMQN, HLAB27                      Renal Function Markers No results found for: BUN, CREATININE, BCR, GFRAA, GFRNONAA, LABVMA, EPIRU, [id number], NOREPRU, NOREPI24HUR, DOPARU, [id number]                           Hepatic Function Markers No results found for: AST, ALT, ALBUMIN, ALKPHOS, HCVAB, AMYLASE, LIPASE, AMMONIA                      Electrolytes No results found for: NA, K, CL, CALCIUM, MG, PHOS                      Neuropathy Markers No results found for: VITAMINB12, FOLATE, HGBA1C, HIV                      CNS Tests No results found for: COLORCSF, APPEARCSF, RBCCOUNTCSF, WBCCSF, POLYSCSF, LYMPHSCSF, EOSCSF, PROTEINCSF, GLUCCSF, JCVIRUS, CSFOLI, IGGCSF                      Bone Pathology Markers No results found for: VD25OH, [id number], [id number], [id number], 25OHVITD1, 25OHVITD2, 25OHVITD3, TESTOFREE, TESTOSTERONE                       Coagulation Parameters No results found for: INR, LABPROT, APTT, PLT, DDIMER, LABHEMA, VITAMINK1                      Cardiovascular Markers No results found for: BNP, CKTOTAL, CKMB, TROPONINI, HGB, HCT                       CA Markers No results found for: CEA, CA125, LABCA2                      Note: Lab results reviewed.  Recent Diagnostic Imaging Review  Cervical Imaging: Cervical MR wo contrast:  Results for orders placed during the hospital encounter of 02/15/18  MR CERVICAL SPINE WO CONTRAST   Narrative CLINICAL DATA:  Left-sided neck and shoulder pain for 10-15 years  EXAM: MRI CERVICAL SPINE WITHOUT CONTRAST  TECHNIQUE: Multiplanar, multisequence MR imaging of the cervical spine was performed. No intravenous contrast was administered.  COMPARISON:   11/02/2004  FINDINGS: Alignment: 2 mm of anterolisthesis at C2-3. Mild retrolisthesis at C6-7.  Vertebrae: No fracture, evidence of discitis, or bone lesion.  Cord: Normal signal  Posterior Fossa, vertebral arteries, paraspinal tissues: Negative  Disc levels:  C2-3: Facet spurring asymmetric to the left. There is anterolisthesis and disc degeneration with central protrusion contacting the ventral cord. Bilateral uncovertebral spurring and foraminal impingement  C3-4: Intervertebral ankylosis since prior. There is bulky uncovertebral spurs and advanced foraminal impingement. Mild spinal stenosis without cord compression  C4-5: Minor facet spurring. Disc narrowing and endplate degeneration with uncovertebral spurs causing biforaminal impingement. Disc osteophyte complex mildly flattens the ventral cord  C5-6: Advanced disc  narrowing with bulging and endplate ridging. There is cord flattening greater on the right. Uncovertebral spurs cause biforaminal impingement.  C6-7: Advanced disc narrowing with bulky endplate and uncovertebral ridging causing advanced biforaminal impingement. Spinal stenosis with mild ventral cord flattening.  C7-T1:Unremarkable.  IMPRESSION: 1. Advanced disc and facet degeneration with advanced biforaminal impingement from C2-3 to C6-7. 2. Spinal stenosis at the same levels with cord flattening mainly at C5-6. 3. C3-4 intervertebral ankylosis since 2006 comparison.   Electronically Signed   By: Monte Fantasia M.D.   On: 02/15/2018 13:07    Cervical CT wo contrast:  Results for orders placed during the hospital encounter of 02/27/18  CT CERVICAL SPINE WO CONTRAST   Narrative CLINICAL DATA:  BILATERAL hand numbness. No loss of strength. Abnormal MRI.  EXAM: CT CERVICAL SPINE WITHOUT CONTRAST  TECHNIQUE: Multidetector CT imaging of the cervical spine was performed without intravenous contrast. Multiplanar CT image reconstructions were  also generated.  COMPARISON:  MRI cervical spine 02/15/2018.  Bone scan 02/19/2018  FINDINGS: Alignment: Straightening, slight reversal normal cervical lordosis. This appears related to advanced degenerative change. 2 mm anterolisthesis C2-3 is facet mediated. Similar 2 mm anterolisthesis C7-T1 is facet mediated.  Skull base and vertebrae: No acute fracture. No primary bone lesion or focal pathologic process.  Soft tissues and spinal canal: No prevertebral fluid or swelling.  Disc levels:  C2-3: 2 mm anterolisthesis. Facet arthropathy and uncinate spurring. Central protrusion. BILATERAL RIGHT greater than LEFT C3 foraminal narrowing.  C3-4: Intervertebral ankylosis. Facet joints remain open. Central osseous spurring extends to both foramina. Mild stenosis. Significant BILATERAL C4 foraminal narrowing.  C4-5: Advanced disc space narrowing. Anterior spurs project into the retropharyngeal soft tissues. Disc space narrowing. BILATERAL facet arthropathy. BILATERAL uncinate spurring results in equally severe C5 foraminal narrowing RIGHT to LEFT. Mild stenosis.  C5-6: Advanced disc space narrowing. Central protrusion with osseous spurring. Facet arthropathy. Severe BILATERAL foraminal narrowing, worse on the RIGHT, due to uncinate spurring. Mild to moderate stenosis.  C6-7: Advanced disc space narrowing. Osseous spurring extends to both uncinate regions where severe BILATERAL foraminal narrowing is seen. Facet arthropathy is superimposed. Mild stenosis.  C7-T1: 2 mm anterolisthesis. Unremarkable disc space. Advanced facet arthropathy worse on the LEFT. No impingement.  Upper chest: Suspected emphysema.  Other: None  IMPRESSION: Severe multilevel spondylosis. Potentially symptomatic neural impingement C2-3 through C6-7 as described above.  No   definite osseous destructive lesion.   Electronically Signed   By: Staci Righter M.D.   On: 02/27/2018 14:44   Shoulder  Imaging:  Results for orders placed during the hospital encounter of 02/08/18  MR SHOULDER LEFT WO CONTRAST   Narrative CLINICAL DATA:  Left shoulder injury July 2019. feeling pain in the shoulder and pectoral area  EXAM: MRI OF THE LEFT SHOULDER WITHOUT CONTRAST  TECHNIQUE: Multiplanar, multisequence MR imaging of the shoulder was performed. No intravenous contrast was administered.  COMPARISON:  None.  FINDINGS: Rotator cuff: Mild tendinosis of the supraspinatus tendon with a tiny insertional interstitial tear. Mild tendinosis of the infraspinatus tendon. Teres minor tendon is intact. Mild tendinosis of the subscapularis tendon.  Muscles: No atrophy or fatty replacement of nor abnormal signal within, the muscles of the rotator cuff.  Biceps long head: Moderate tendinosis of the intra-articular portion of the long head of the biceps tendon.  Acromioclavicular Joint: Severe arthropathy of the acromioclavicular joint. Type I acromion. Small amount of subacromial/subdeltoid bursal fluid.  Glenohumeral Joint: Small joint effusion. Mild partial-thickness cartilage loss of the  glenohumeral joint.  Labrum:  Superior labral degeneration.  Bones:  No acute osseous abnormality.  No aggressive osseous lesion.  Other: No fluid collection or hematoma.  IMPRESSION: 1. Mild tendinosis of the supraspinatus tendon with a tiny insertional interstitial tear. 2. Mild tendinosis of the infraspinatus tendon. 3. Moderate tendinosis of the intra-articular portion of the long head of the biceps tendon.   Electronically Signed   By: Kathreen Devoid   On: 02/08/2018 15:19     Lumbosacral Imaging: Lumbar MR wo contrast:  Results for orders placed during the hospital encounter of 02/05/15  MR Lumbar Spine Wo Contrast   Narrative CLINICAL DATA:  Low back pain extending into both legs. Pain on and off for years.  EXAM: MRI LUMBAR SPINE WITHOUT CONTRAST  TECHNIQUE: Multiplanar,  multisequence MR imaging of the lumbar spine was performed. No intravenous contrast was administered.  COMPARISON:  11/23/2010.  FINDINGS: Segmentation: Normal.  Alignment: 3 mm anterolisthesis L4-5. 5 mm retrolisthesis L2-3. These are degenerative/ facet related.  Vertebrae: No worrisome osseous lesion.  Conus medullaris: Normal in size, signal, and location.  Paraspinal tissues: No evidence for hydronephrosis or paravertebral mass.  Disc levels:  L1-L2: Central and leftward protrusion. Mild facet arthropathy. LEFT subarticular zone and foraminal zone narrowing. LEFT L2 nerve root impingement is possible.  L2-L3: 5 mm retrolisthesis. Near-complete loss of interspace height. Osseous spurring predominantly from the inferior aspect of L2 contributes to severe spinal stenosis. Shallow central protrusion. Superimposed facet arthropathy and ligamentum flavum hypertrophy. BILATERAL L2 and L3 nerve root impingement.  L3-L4: Mild bulge. Asymmetric posterior element hypertrophy on the LEFT. Mild stenosis. Subarticular zone and foraminal zone narrowing is not established.  L4-L5: 3 mm anterolisthesis is facet mediated. There is moderate facet and severe ligamentum flavum hypertrophy. Nodular character to the LEFT ligamentum flavum suggests possible superimposed 6 mm synovial cyst. Severe spinal stenosis. Broad-based central protrusion. LEFT greater than RIGHT subarticular zone and foraminal zone narrowing affecting the L4 and L5 nerve root impingement.  L5-S1:  Mild bulge.  Moderate facet arthropathy.  No impingement.  Unremarkable SI joints and visualized pelvis.  Compared with 2012, there is progression of disease at L2-3 and L4-5.  IMPRESSION: Severe spinal stenosis at L2-3 is related to retrolisthesis, osseous spurring, disc material, and posterior element hypertrophy. BILATERAL L2 and L3 nerve root impingement.  Severe spinal stenosis is also present at L4-5, with 3 mm  of anterolisthesis, posterior element hypertrophy, disc material, possible 6 mm LEFT synovial cyst contribute to LEFT greater than RIGHT L4 and L5 nerve root impingement.  Multilevel spondylosis elsewhere, with possible subarticular zone narrowing at L1-2 on the LEFT. Disc disease.   Electronically Signed   By: Staci Righter M.D.   On: 02/05/2015 15:28    Lumbar MR wo contrast: No procedure found. Lumbar MR w/wo contrast:  Results for orders placed during the hospital encounter of 03/19/18  MR Lumbar Spine W Wo Contrast   Narrative CLINICAL DATA:  Newly diagnosed prostate cancer. Abnormal uptake at L3 on a nuclear medicine bone scan. Chronic low back pain.  EXAM: MRI LUMBAR SPINE WITHOUT AND WITH CONTRAST  TECHNIQUE: Multiplanar and multiecho pulse sequences of the lumbar spine were obtained without and with intravenous contrast.  CONTRAST:  10 mL Gadavist  COMPARISON:  Bone scan 02/19/2018.  Lumbar spine MRI 02/05/2015.  FINDINGS: Segmentation:  Standard.  Alignment: Unchanged trace retrolisthesis of L1 on L2, 5 mm retrolisthesis of L2 on L3, and 3 mm anterolisthesis of L4 on L5.  Vertebrae: Mild chronic diffuse bone marrow heterogeneity, similar to the prior MRI. No suspicious enhancing or destructive lesion. Mild degenerative endplate edema at C7-8. Small L3 superior endplate Schmorl's node.  Conus medullaris and cauda equina: Conus extends to the upper L2 level. Conus and cauda equina appear normal.  Paraspinal and other soft tissues: Unremarkable.  Disc levels:  Disc desiccation throughout the lumbar spine. Chronic severe disc space narrowing at L2-3 with mild narrowing at L1-2 and L4-5.  T12-L1: Increased, mild disc bulging without stenosis.  L1-2: Disc bulging asymmetric to the left and mild facet hypertrophy result in mild left lateral recess and mild left neural foraminal stenosis without significant spinal stenosis, similar to prior. There is a left  extraforaminal disc protrusion which is larger and more focal than on the prior study and may affect the left L1 nerve.  L2-3: Circumferential disc bulging, endplate spurring, disc space height loss, and moderate facet and ligamentum flavum hypertrophy result in severe spinal stenosis, bilateral lateral recess stenosis, and moderate right and mild left neural foraminal stenosis, not significantly changed. Potential bilateral L3 nerve root impingement.  L3-4: Circumferential disc bulging and moderate facet and ligamentum flavum hypertrophy result in moderate spinal stenosis, mild bilateral lateral recess stenosis, and moderate right and mild left neural foraminal stenosis, mildly progressed from prior.  L4-5: Anterolisthesis with mild bulging of uncovered disc, ligamentum flavum hypertrophy, and severe left greater than right facet hypertrophy result in severe spinal stenosis, moderate left greater than right lateral recess stenosis, and mild left greater than right neural foraminal stenosis, unchanged.  L5-S1: Mild disc bulging, a new left foraminal to extraforaminal disc protrusion, and severe facet hypertrophy without stenosis result in new moderate left neural foraminal stenosis and potential left L5 nerve root impingement. No spinal stenosis.  IMPRESSION: 1. No evidence of metastatic disease in the lumbar spine. 2. New left foraminal/extraforaminal disc protrusion at L5-S1 with moderate left neural foraminal stenosis and potential L5 nerve root impingement. 3. Larger/more focal left extraforaminal disc protrusion at L1-2 potentially affecting the L1 nerve. 4. Progressive, moderate spinal stenosis and right greater than left neural foraminal stenosis at L3-4. 5. Unchanged severe spinal stenosis at L2-3 and L4-5.   Electronically Signed   By: Logan Bores M.D.   On: 03/20/2018 09:47    Lumbar DG Bending views:  Results for orders placed during the hospital encounter of  12/05/17  DG Lumbar Spine Complete W/Bend   Narrative CLINICAL DATA:  Chronic bilateral low back pain with left-sided sciatica.  EXAM: LUMBAR SPINE - COMPLETE WITH BENDING VIEWS  COMPARISON:  Radiographs of October 12, 2009.  FINDINGS: Mild grade 1 retrolisthesis of L2-3 is noted secondary to severe degenerative disc disease at this level. This is unchanged compared to prior exam. Mild degenerative disc disease is noted at L1-2 and L3-4 with anterior osteophyte formation. No change in vertebral body alignment is noted on flexion or extension views. No fracture is noted. Atherosclerosis of abdominal aorta is noted. Degenerative changes seen involving posterior facet joints of L4-5 and L5-S1.  IMPRESSION: Multilevel degenerative disc disease as described above. Stable grade 1 retrolisthesis of L2-3 secondary to severe degenerative disc disease. No acute abnormality seen in the lumbar spine.  Aortic Atherosclerosis (ICD10-I70.0).   Electronically Signed   By: Marijo Conception, M.D.   On: 12/06/2017 09:30   Sacroiliac Joint Imaging: Sacroiliac Joint DG:  Results for orders placed during the hospital encounter of 12/05/17  DG Si Joints   Narrative CLINICAL DATA:  Chronic left sacroiliac joint pain.  EXAM: BILATERAL SACROILIAC JOINTS - 3+ VIEW  COMPARISON:  None.  FINDINGS: The sacroiliac joint spaces are maintained and there is no evidence of arthropathy. No other bone abnormalities are seen.  IMPRESSION: Negative.   Electronically Signed   By: Marijo Conception, M.D.   On: 12/06/2017 09:27   Hip Imaging:  Results for orders placed during the hospital encounter of 12/05/17  DG HIP UNILAT W OR W/O PELVIS 2-3 VIEWS LEFT   Narrative CLINICAL DATA:  Chronic left hip pain.  EXAM: DG HIP (WITH OR WITHOUT PELVIS) 2-3V LEFT  COMPARISON:  None.  FINDINGS: There is no evidence of hip fracture or dislocation. There is no evidence of arthropathy or other focal bone  abnormality.  IMPRESSION: Normal left hip.   Electronically Signed   By: Marijo Conception, M.D.   On: 12/06/2017 09:26   Complexity Note: Imaging results reviewed. Results shared with Mr. Garis, using Layman's terms.                         Meds   Current Outpatient Medications:  .  acetaminophen (TYLENOL) 500 MG tablet, Take as needed by mouth. , Disp: , Rfl:  .  Albuterol Sulfate 108 (90 Base) MCG/ACT AEPB, Inhale as needed into the lungs. , Disp: , Rfl:  .  aspirin 81 MG tablet, Take by mouth., Disp: , Rfl:  .  buPROPion (WELLBUTRIN XL) 300 MG 24 hr tablet, Take by mouth., Disp: , Rfl:  .  cholecalciferol (VITAMIN D) 1000 units tablet, Take by mouth., Disp: , Rfl:  .  clobetasol ointment (TEMOVATE) 6.04 %, Apply 1 application topically 2 (two) times daily., Disp: , Rfl:  .  clotrimazole-betamethasone (LOTRISONE) cream, Apply 1 application topically 2 (two) times daily., Disp: , Rfl:  .  diclofenac sodium (VOLTAREN) 1 % GEL, Apply topically 4 (four) times daily., Disp: , Rfl:  .  DULoxetine (CYMBALTA) 60 MG capsule, Take 1 capsule (60 mg total) by mouth daily., Disp: 30 capsule, Rfl: 4 .  esomeprazole (NEXIUM) 40 MG capsule, Take by mouth., Disp: , Rfl:  .  ezetimibe (ZETIA) 10 MG tablet, , Disp: , Rfl:  .  fenofibrate (TRICOR) 48 MG tablet, Take 48 mg daily by mouth. , Disp: , Rfl:  .  finasteride (PROSCAR) 5 MG tablet, Take 5 mg daily by mouth. , Disp: , Rfl:  .  gabapentin (NEURONTIN) 300 MG capsule, Take 2 capsules (600 mg total) by mouth at bedtime., Disp: 60 capsule, Rfl: 4 .  hydrochlorothiazide (HYDRODIURIL) 25 MG tablet, , Disp: , Rfl:  .  ibuprofen (ADVIL,MOTRIN) 200 MG tablet, Take 200 mg by mouth every 6 (six) hours as needed., Disp: , Rfl:  .  meloxicam (MOBIC) 15 MG tablet, Take 15 mg by mouth daily., Disp: , Rfl:  .  methocarbamol (ROBAXIN) 500 MG tablet, Take 500 mg by mouth 2 (two) times daily as needed for muscle spasms., Disp: , Rfl:  .  mupirocin ointment  (BACTROBAN) 2 %, Place 1 application into the nose 2 (two) times daily., Disp: , Rfl:  .  niacin-lovastatin (ADVICOR) 1000-20 MG 24 hr tablet, Take by mouth., Disp: , Rfl:  .  [START ON 04/26/2018] oxyCODONE-acetaminophen (PERCOCET) 10-325 MG tablet, Take 1 tablet by mouth every 8 (eight) hours as needed for pain., Disp: 90 tablet, Rfl: 0 .  oxyCODONE-acetaminophen (PERCOCET) 10-325 MG tablet, Take 1 tablet by mouth every 8 (eight) hours as needed  for pain., Disp: 90 tablet, Rfl: 0 .  pravastatin (PRAVACHOL) 20 MG tablet, Take by mouth., Disp: , Rfl:  .  sildenafil (REVATIO) 20 MG tablet, Take 3 to 5 tablets two hours before intercouse on an empty stomach.  Do not take with nitrates., Disp: 50 tablet, Rfl: 3  ROS  Constitutional: Denies any fever or chills Gastrointestinal: No reported hemesis, hematochezia, vomiting, or acute GI distress Musculoskeletal: Denies any acute onset joint swelling, redness, loss of ROM, or weakness Neurological: No reported episodes of acute onset apraxia, aphasia, dysarthria, agnosia, amnesia, paralysis, loss of coordination, or loss of consciousness  Allergies  Mr. Denzer is allergic to niacin-lovastatin er; atorvastatin; simvastatin; and propoxyphene.  Senath  Drug: Mr. Mcenery  reports no history of drug use. Alcohol:  reports no history of alcohol use. Tobacco:  reports that he quit smoking about 6 years ago. He has never used smokeless tobacco. Medical:  has a past medical history of Anxiety, Benign essential HTN (06/24/2015), BPH (benign prostatic hyperplasia), COPD (chronic obstructive pulmonary disease) ([place]), Coronary artery disease (02/16/2014), DDD (degenerative disc disease), cervical, Degenerative disc disease, cervical (01/22/2017), Depression, DJD (degenerative joint disease), Emphysema of lung ([place]), Heel pain (10/10/2017), Hyperglobulinemia, Lumbar degenerative disc disease (01/22/2017), Major depression in remission ([place]) (03/10/2014), Migraines, Mitral  regurgitation, Mixed hyperlipidemia (02/16/2014), Neck pain (01/24/2017), OSA (obstructive sleep apnea) (07/05/2016), Osteoarthritis of knee (08/12/2014), RA (rheumatoid arthritis) ([place]), Spinal stenosis of lumbar region with neurogenic claudication (04/18/2016), Spondylosis without myelopathy or radiculopathy, lumbar region (01/22/2017), and Venous insufficiency of both lower extremities (11/07/2016). Surgical: Mr. Steagall  has a past surgical history that includes Cholecystectomy; Knee surgery (Left); deviated septum repair; Cardiac catheterization; Colonoscopy; Nasal septum surgery; Colonoscopy with propofol (N/A, 01/09/2018); and Esophagogastroduodenoscopy (egd) with propofol (N/A, 01/09/2018). Family: family history includes Heart disease in his mother.  Constitutional Exam  General appearance: Well nourished, well developed, and well hydrated. In no apparent acute distress Vitals:   04/17/18 0900  BP: 124/68  Pulse: 94  Resp: 16  Temp: 97.9 F (36.6 C)  TempSrc: Oral  SpO2: 95%  Weight: 217 lb (98.4 kg)  Height: _0  (1.727 m)  Psych/Mental status: Alert, oriented x 3 (person, place, & time)       Eyes: PERLA Respiratory: No evidence of acute respiratory distress  Lumbar Spine Area Exam  Skin & Axial Inspection: No masses, redness, or swelling Alignment: Symmetrical Functional ROM: Unrestricted ROM       Stability: No instability detected Muscle Tone/Strength: Functionally intact. No obvious neuro-muscular anomalies detected. Sensory (Neurological): Unimpaired Palpation: Complains of area being tender to palpation       Provocative Tests: Hyperextension/rotation test: Positive on the right for facet joint pain. Lumbar quadrant test (Kemp's test): deferred today       Lateral bending test: deferred today       Patrick's Maneuver: (+) for right-sided S-I arthralgia               Gait & Posture Assessment  Ambulation: Unassisted Gait: Relatively normal for age and body  habitus Posture: WNL   Lower Extremity Exam    Side: Right lower extremity  Side: Left lower extremity  Stability: No instability observed          Stability: No instability observed          Skin & Extremity Inspection: Skin color, temperature, and hair growth are WNL. No peripheral edema or cyanosis. No masses, redness, swelling, asymmetry, or associated skin lesions. No contractures.  Skin & Extremity  Inspection: Skin color, temperature, and hair growth are WNL. No peripheral edema or cyanosis. No masses, redness, swelling, asymmetry, or associated skin lesions. No contractures.  Functional ROM: Unrestricted ROM                  Functional ROM: Unrestricted ROM                  Muscle Tone/Strength: Functionally intact. No obvious neuro-muscular anomalies detected.  Muscle Tone/Strength: Functionally intact. No obvious neuro-muscular anomalies detected.  Sensory (Neurological): Movement-associated pain        Sensory (Neurological): Unimpaired        Palpation: No palpable anomalies  Palpation: No palpable anomalies   Assessment  Primary Diagnosis & Pertinent Problem List: The primary encounter diagnosis was Spondylosis without myelopathy or radiculopathy, lumbar region. Diagnoses of Chronic sacroiliac joint pain (R>L), Groin pain, chronic, right, Chronic pain syndrome, and Lumbar spondylosis were also pertinent to this visit.  Status Diagnosis  Worsening Worsening Worsening 1. Spondylosis without myelopathy or radiculopathy, lumbar region   2. Chronic sacroiliac joint pain (R>L)   3. Groin pain, chronic, right   4. Chronic pain syndrome   5. Lumbar spondylosis     Problems updated and reviewed during this visit: Problem  Chronic sacroiliac joint pain (R>L)  Groin Pain, Chronic, Right  B12 Deficiency  Prostate Cancer (Hcc)   Crystal/stoioff following   Last Assessment & Plan:  Crystal/stoioff following    Plan of Care  Pharmacotherapy (Medications Ordered): Meds ordered  this encounter  Medications  . orphenadrine (NORFLEX) injection 60 mg  . ketorolac (TORADOL) injection 60 mg   New Prescriptions   No medications on file   Medications administered today: We administered orphenadrine and ketorolac. Lab-work, procedure(s), and/or referral(s): Orders Placed This Encounter  Procedures  . SACROILIAC JOINT INJECTION  . LUMBAR FACET(MEDIAL BRANCH NERVE BLOCK) MBNB   Imaging and/or referral(s): None  Interventional therapies: Planned, scheduled, and/or pending:   Diagnostic right-sided lumbar facet nerve block along with a right-sided sacroiliac joint injection    Provider-requested follow-up: No follow-ups on file.  Future Appointments  Date Time Provider [place]  04/18/2018 11:15 AM [facility]RO LINAC 1 [facility] None  04/19/2018 11:15 AM [facility]RO LINAC 1 [facility] None  04/22/2018 11:15 AM [facility]-RO LINAC 1 [facility] None  04/23/2018 11:00 AM [facility]-MO LAB [facility] None  04/23/2018 11:15 AM [facility]RO LINAC 1 [facility] None  04/24/2018 11:15 AM [facility]RO LINAC 1 [facility] None  04/24/2018 12:45 PM Gillis Santa, MD [facility] None  04/25/2018 11:15 AM [facility]-RO LINAC 1 [facility] None  04/26/2018 11:15 AM [facility]-RO LINAC 1 [facility] None  04/29/2018 10:30 AM [facility]-LAB [facility] None  04/29/2018 11:15 AM [facility]-RO LINAC 1 [facility] None  04/30/2018 11:15 AM [facility]-RO LINAC 1 [facility] None  05/01/2018 11:15 AM [facility]-RO LINAC 1 [facility] None  05/02/2018 11:15 AM [facility]-RO LINAC 1 [facility] None  05/03/2018 10:00 AM [facility]-NURSE [facility] None  05/03/2018 11:15 AM [facility]-RO LINAC 1 [facility] None  05/06/2018 11:15 AM [facility]-RO LINAC 1 [facility] None  05/07/2018 11:00 AM [facility]MO LAB [facility] None  05/07/2018 11:15 AM [facility]-RO LINAC 1 [facility] None  05/08/2018 11:15 AM [facility]RO LINAC 1 [facility] None  05/09/2018 11:15 AM [facility]RO LINAC 1 [facility] None  05/10/2018 11:15 AM [facility]RO LINAC 1 [facility] None  05/13/2018 11:15 AM [facility]RO LINAC 1 [facility] None   05/14/2018 11:15 AM [facility]RO LINAC 1 [facility] None  05/21/2018  7:30 AM CCAR-RO CT SIM [facility] None  05/21/2018  7:30 AM Noreene Filbert, MD [facility] None  05/28/2018  11:00 AM Vevelyn Francois, NP [facility] None  05/29/2018  8:00 AM [facility] PAT2 [facility] None  06/11/2018  7:30 AM CCAR-RO CT SIM [facility] None  07/09/2018  9:00 AM CCAR-RO CT SIM [facility] None  07/09/2018  9:00 AM Chrystal, Eulas Post, MD [facility] None   Primary Care Physician: Kirk Ruths, MD Location: [facility] Note by: Vevelyn Francois NP Date: 04/17/2018; Time: 2:57 PM  Pain Score Disclaimer: We use the NRS-11 scale. This is a self-reported, subjective measurement of pain severity with only modest accuracy. It is used primarily to identify changes within a particular patient. It must be understood that outpatient pain scales are significantly less accurate that those used for research, where they can be applied under ideal controlled circumstances with minimal exposure to variables. In reality, the score is likely to be a combination of pain intensity and pain affect, where pain affect describes the degree of emotional arousal or changes in action readiness caused by the sensory experience of pain. Factors such as social and work situation, setting, emotional state, anxiety levels, expectation, and prior pain experience may influence pain perception and show large inter-individual differences that may also be affected by time variables.  Patient instructions provided during this appointment: Patient Instructions  ____________________________________________________________________________________________  Medication Rules  Purpose: To inform patients, and their family members, of our rules and regulations.  Applies to: All patients receiving prescriptions (written or electronic).  Pharmacy of record: Pharmacy where electronic prescriptions will be sent. If written prescriptions  are taken to a different pharmacy, please inform the nursing staff. The pharmacy listed in the electronic medical record should be the one where you would like electronic prescriptions to be sent.  Electronic prescriptions: In compliance with the [place] (STOP) Act of 2017 (Session Lanny Cramp [phone]), effective April 10, 2018, all controlled substances must be electronically prescribed. Calling prescriptions to the pharmacy will cease to exist.  Prescription refills: Only during scheduled appointments. Applies to all prescriptions.  NOTE: The following applies primarily to controlled substances (Opioid* Pain Medications).   Patient's responsibilities: 1. Pain Pills: Bring all pain pills to every appointment (except for procedure appointments). 2. Pill Bottles: Bring pills in original pharmacy bottle. Always bring the newest bottle. Bring bottle, even if empty. 3. Medication refills: You are responsible for knowing and keeping track of what medications you take and those you need refilled. The day before your appointment: write a list of all prescriptions that need to be refilled. The day of the appointment: give the list to the admitting nurse. Prescriptions will be written only during appointments. If you forget a medication: it will not be "Called in", "Faxed", or "electronically sent". You will need to get another appointment to get these prescribed. No early refills. Do not call asking to have your prescription filled early. 4. Prescription Accuracy: You are responsible for carefully inspecting your prescriptions before leaving our office. Have the discharge nurse carefully go over each prescription with you, before taking them home. Make sure that your name is accurately spelled, that your address is correct. Check the name and dose of your medication to make sure it is accurate. Check the number of pills, and the written instructions to make sure they  are clear and accurate. Make sure that you are given enough medication to last until your next medication refill appointment. 5. Taking Medication: Take medication as prescribed. When it comes to controlled substances, taking less pills or less frequently than prescribed  is permitted and encouraged. Never take more pills than instructed. Never take medication more frequently than prescribed.  6. Inform other Doctors: Always inform, all of your healthcare providers, of all the medications you take. 7. Pain Medication from other Providers: You are not allowed to accept any additional pain medication from any other Doctor or Healthcare provider. There are two exceptions to this rule. (see below) In the event that you require additional pain medication, you are responsible for notifying us, as stated below. 8. Medication Agreement: You are responsible for carefully reading and following our Medication Agreement. This must be signed before receiving any prescriptions from our practice. Safely store a copy of your signed Agreement. Violations to the Agreement will result in no further prescriptions. (Additional copies of our Medication Agreement are available upon request.) 9. Laws, Rules, & Regulations: All patients are expected to follow all Federal and [employer], [employer], Rules, [employer]. Ignorance of the Laws does not constitute a valid excuse. The use of any illegal substances is prohibited. 10. Adopted CDC guidelines & recommendations: Target dosing levels will be at or below 60 MME/day. Use of benzodiazepines** is not recommended.  Exceptions: There are only two exceptions to the rule of not receiving pain medications from other Healthcare Providers. 1. Exception #1 (Emergencies): In the event of an emergency (i.e.: accident requiring emergency care), you are allowed to receive additional pain medication. However, you are responsible for: As soon as you are able, call our office [phone], at  any time of the day or night, and leave a message stating your name, the date and nature of the emergency, and the name and dose of the medication prescribed. In the event that your call is answered by a member of our staff, make sure to document and save the date, time, and the name of the person that took your information.  2. Exception #2 (Planned Surgery): In the event that you are scheduled by another doctor or dentist to have any type of surgery or procedure, you are allowed (for a period no longer than 30 days), to receive additional pain medication, for the acute post-op pain. However, in this case, you are responsible for picking up a copy of our "Post-op Pain Management for Surgeons" handout, and giving it to your surgeon or dentist. This document is available at our office, and does not require an appointment to obtain it. Simply go to our office during business hours (Monday-Thursday from 8:00 AM to 4:00 PM) (Friday 8:00 AM to 12:00 Noon) or if you have a scheduled appointment with [place], prior to your surgery, and ask for it by name. In addition, you will need to provide [place] with your name, name of your surgeon, type of surgery, and date of procedure or surgery.  *Opioid medications include: morphine, codeine, oxycodone, oxymorphone, hydrocodone, hydromorphone, meperidine, tramadol, tapentadol, buprenorphine, fentanyl, methadone. **Benzodiazepine medications include: diazepam (Valium), alprazolam (Xanax), clonazepam (Klonopine), lorazepam (Ativan), clorazepate (Tranxene), chlordiazepoxide (Librium), estazolam (Prosom), oxazepam (Serax), temazepam (Restoril), triazolam (Halcion) (Last updated: 06/07/2017) ____________________________________________________________________________________________   ____________________________________________________________________________________________  General Risks and Possible Complications  Patient Responsibilities: It is important that you read this as  it is part of your informed consent. It is our duty to inform you of the risks and possible complications associated with treatments offered to you. It is your responsibility as a patient to read this and to ask questions about anything that is not clear or that you believe was not covered in this document.  Patient's Rights: You  have the right to refuse treatment. You also have the right to change your mind, even after initially having agreed to have the treatment done. However, under this last option, if you wait until the last second to change your mind, you may be charged for the materials used up to that point.  Introduction: Medicine is not an Chief Strategy Officer. Everything in Medicine, including the lack of treatment(s), carries the potential for danger, harm, or loss (which is by definition: Risk). In Medicine, a complication is a secondary problem, condition, or disease that can aggravate an already existing one. All treatments carry the risk of possible complications. The fact that a side effects or complications occurs, does not imply that the treatment was conducted incorrectly. It must be clearly understood that these can happen even when everything is done following the highest safety standards.  No treatment: You can choose not to proceed with the proposed treatment alternative. The "PRO(s)" would include: avoiding the risk of complications associated with the therapy. The "CON(s)" would include: not getting any of the treatment benefits. These benefits fall under one of three categories: diagnostic; therapeutic; and/or palliative. Diagnostic benefits include: getting information which can ultimately lead to improvement of the disease or symptom(s). Therapeutic benefits are those associated with the successful treatment of the disease. Finally, palliative benefits are those related to the decrease of the primary symptoms, without necessarily curing the condition (example: decreasing the pain from a  flare-up of a chronic condition, such as incurable terminal cancer).  General Risks and Complications: These are associated to most interventional treatments. They can occur alone, or in combination. They fall under one of the following six (6) categories: no benefit or worsening of symptoms; bleeding; infection; nerve damage; allergic reactions; and/or death. 1. No benefits or worsening of symptoms: In Medicine there are no guarantees, only probabilities. No healthcare provider can ever guarantee that a medical treatment will work, they can only state the probability that it may. Furthermore, there is always the possibility that the condition may worsen, either directly, or indirectly, as a consequence of the treatment. 2. Bleeding: This is more common if the patient is taking a blood thinner, either prescription or over the counter (example: Goody Powders, Fish oil, Aspirin, Garlic, etc.), or if suffering a condition associated with impaired coagulation (example: Hemophilia, cirrhosis of the liver, low platelet counts, etc.). However, even if you do not have one on these, it can still happen. If you have any of these conditions, or take one of these drugs, make sure to notify your treating physician. 3. Infection: This is more common in patients with a compromised immune system, either due to disease (example: diabetes, cancer, human immunodeficiency virus [HIV], etc.), or due to medications or treatments (example: therapies used to treat cancer and rheumatological diseases). However, even if you do not have one on these, it can still happen. If you have any of these conditions, or take one of these drugs, make sure to notify your treating physician. 4. Nerve Damage: This is more common when the treatment is an invasive one, but it can also happen with the use of medications, such as those used in the treatment of cancer. The damage can occur to small secondary nerves, or to large primary ones, such as those  in the spinal cord and brain. This damage may be temporary or permanent and it may lead to impairments that can range from temporary numbness to permanent paralysis and/or brain death. 5. Allergic Reactions: Any time a  substance or material comes in contact with our body, there is the possibility of an allergic reaction. These can range from a mild skin rash (contact dermatitis) to a severe systemic reaction (anaphylactic reaction), which can result in death. 6. Death: In general, any medical intervention can result in death, most of the time due to an unforeseen complication. ____________________________________________________________________________________________  ____________________________________________________________________________________________  Preparing for Procedure with Sedation  Instructions: . Oral Intake: Do not eat or drink anything for at least 8 hours prior to your procedure. . Transportation: Public transportation is not allowed. Bring an adult driver. The driver must be physically present in our waiting room before any procedure can be started. Marland Kitchen Physical Assistance: Bring an adult physically capable of assisting you, in the event you need help. This adult should keep you company at home for at least 6 hours after the procedure. . Blood Pressure Medicine: Take your blood pressure medicine with a sip of water the morning of the procedure. . Blood thinners: Notify our staff if you are taking any blood thinners. Depending on which one you take, there will be specific instructions on how and when to stop it. . Diabetics on insulin: Notify the staff so that you can be scheduled 1st case in the morning. If your diabetes requires high dose insulin, take only  of your normal insulin dose the morning of the procedure and notify the staff that you have done so. . Preventing infections: Shower with an antibacterial soap the morning of your procedure. . Build-up your immune system:  Take 1000 mg of Vitamin C with every meal (3 times a day) the day prior to your procedure. Marland Kitchen Antibiotics: Inform the staff if you have a condition or reason that requires you to take antibiotics before dental procedures. . Pregnancy: If you are pregnant, call and cancel the procedure. . Sickness: If you have a cold, fever, or any active infections, call and cancel the procedure. . Arrival: You must be in the facility at least 30 minutes prior to your scheduled procedure. . Children: Do not bring children with you. . Dress appropriately: Bring dark clothing that you would not mind if they get stained. . Valuables: Do not bring any jewelry or valuables.  Procedure appointments are reserved for interventional treatments only. Marland Kitchen No Prescription Refills. . No medication changes will be discussed during procedure appointments. . No disability issues will be discussed.  Reasons to call and reschedule or cancel your procedure: (Following these recommendations will minimize the risk of a serious complication.) . Surgeries: Avoid having procedures within 2 weeks of any surgery. (Avoid for 2 weeks before or after any surgery). . Flu Shots: Avoid having procedures within 2 weeks of a flu shots or . (Avoid for 2 weeks before or after immunizations). . Barium: Avoid having a procedure within 7-10 days after having had a radiological study involving the use of radiological contrast. (Myelograms, Barium swallow or enema study). . Heart attacks: Avoid any elective procedures or surgeries for the initial 6 months after a "Myocardial Infarction" (Heart Attack). . Blood thinners: It is imperative that you stop these medications before procedures. Let us know if you if you take any blood thinner.  . Infection: Avoid procedures during or within two weeks of an infection (including chest colds or gastrointestinal problems). Symptoms associated with infections include: Localized redness, fever, chills, night sweats or  profuse sweating, burning sensation when voiding, cough, congestion, stuffiness, runny nose, sore throat, diarrhea, nausea, vomiting, cold or Flu symptoms, recent or  current infections. It is specially important if the infection is over the area that we intend to treat. Marland Kitchen Heart and lung problems: Symptoms that may suggest an active cardiopulmonary problem include: cough, chest pain, breathing difficulties or shortness of breath, dizziness, ankle swelling, uncontrolled high or unusually low blood pressure, and/or palpitations. If you are experiencing any of these symptoms, cancel your procedure and contact your primary care physician for an evaluation.  Remember:  Regular Business hours are:  Monday to Thursday 8:00 AM to 4:00 PM  Provider's Schedule: Milinda Pointer, MD:  Procedure days: Tuesday and Thursday 7:30 AM to 4:00 PM  Gillis Santa, MD:  Procedure days: Monday and Wednesday 7:30 AM to 4:00 PM ____________________________________________________________________________________________  Facet Blocks Patient Information  Description: The facets are joints in the spine between the vertebrae.  Like any joints in the body, facets can become irritated and painful.  Arthritis can also effect the facets.  By injecting steroids and local anesthetic in and around these joints, we can temporarily block the nerve supply to them.  Steroids act directly on irritated nerves and tissues to reduce selling and inflammation which often leads to decreased pain.  Facet blocks may be done anywhere along the spine from the neck to the low back depending upon the location of your pain.   After numbing the skin with local anesthetic (like Novocaine), a small needle is passed onto the facet joints under x-ray guidance.  You may experience a sensation of pressure while this is being done.  The entire block usually lasts about 15-25 minutes.   Conditions which may be treated by facet blocks:   Low back/buttock  pain  Neck/shoulder pain  Certain types of headaches  Preparation for the injection:  1. Do not eat any solid food or dairy products within 8 hours of your appointment. 2. You may drink clear liquid up to 3 hours before appointment.  Clear liquids include water, black coffee, juice or soda.  No milk or cream please. 3. You may take your regular medication, including pain medications, with a sip of water before your appointment.  Diabetics should hold regular insulin (if taken separately) and take 1/2 normal NPH dose the morning of the procedure.  Carry some sugar containing items with you to your appointment. 4. A driver must accompany you and be prepared to drive you home after your procedure. 5. Bring all your current medications with you. 6. An IV may be inserted and sedation may be given at the discretion of the physician. 7. A blood pressure cuff, EKG and other monitors will often be applied during the procedure.  Some patients may need to have extra oxygen administered for a short period. 8. You will be asked to provide medical information, including your allergies and medications, prior to the procedure.  We must know immediately if you are taking blood thinners (like Coumadin/Warfarin) or if you are allergic to IV iodine contrast (dye).  We must know if you could possible be pregnant.  Possible side-effects:   Bleeding from needle site  Infection (rare, may require surgery)  Nerve injury (rare)  Numbness & tingling (temporary)  Difficulty urinating (rare, temporary)  Spinal headache (a headache worse with upright posture)  Light-headedness (temporary)  Pain at injection site (serveral days)  Decreased blood pressure (rare, temporary)  Weakness in arm/leg (temporary)  Pressure sensation in back/neck (temporary)   Call if you experience:   Fever/chills associated with headache or increased back/neck pain  Headache worsened by an  upright position  New onset,  weakness or numbness of an extremity below the injection site  Hives or difficulty breathing (go to the emergency room)  Inflammation or drainage at the injection site(s)  Severe back/neck pain greater than usual  New symptoms which are concerning to you  Please note:  Although the local anesthetic injected can often make your back or neck feel good for several hours after the injection, the pain will likely return. It takes 3-7 days for steroids to work.  You may not notice any pain relief for at least one week.  If effective, we will often do a series of 2-3 injections spaced 3-6 weeks apart to maximally decrease your pain.  After the initial series, you may be a candidate for a more permanent nerve block of the facets.  If you have any questions, please call #[phone]) [facility]  toradol 60 mg IM and norflex 60 mg IM given today.

## 2018-04-17 NOTE — Patient Instructions (Addendum)
____________________________________________________________________________________________  Medication Rules  Purpose: To inform patients, and their family members, of our rules and regulations.  Applies to: All patients receiving prescriptions (written or electronic).  Pharmacy of record: Pharmacy where electronic prescriptions will be sent. If written prescriptions are taken to a different pharmacy, please inform the nursing staff. The pharmacy listed in the electronic medical record should be the one where you would like electronic prescriptions to be sent.  Electronic prescriptions: In compliance with the [place]reng[place] Opioid Misuse Prevention (STOP) Act of 2017 (Session Law 2017-74/H243), effective April 10, 2018, all controlled substances must be electronically prescribed. Calling prescriptions to the pharmacy will cease to exist.  Prescription refills: Only during scheduled appointments. Applies to all prescriptions.  NOTE: The following applies primarily to controlled substances (Opioid* Pain Medications).   Patient's responsibilities: 1. Pain Pills: Bring all pain pills to every appointment (except for procedure appointments). 2. Pill Bottles: Bring pills in original pharmacy bottle. Always bring the newest bottle. Bring bottle, even if empty. 3. Medication refills: You are responsible for knowing and keeping track of what medications you take and those you need refilled. The day before your appointment: write a list of all prescriptions that need to be refilled. The day of the appointment: give the list to the admitting nurse. Prescriptions will be written only during appointments. If you forget a medication: it will not be "Called in", "Faxed", or "electronically sent". You will need to get another appointment to get these prescribed. No early refills. Do not call asking to have your prescription filled early. 4. Prescription Accuracy: You are responsible for  carefully inspecting your prescriptions before leaving our office. Have the discharge nurse carefully go over each prescription with you, before taking them home. Make sure that your name is accurately spelled, that your address is correct. Check the name and dose of your medication to make sure it is accurate. Check the number of pills, and the written instructions to make sure they are clear and accurate. Make sure that you are given enough medication to last until your next medication refill appointment. 5. Taking Medication: Take medication as prescribed. When it comes to controlled substances, taking less pills or less frequently than prescribed is permitted and encouraged. Never take more pills than instructed. Never take medication more frequently than prescribed.  6. Inform other Doctors: Always inform, all of your healthcare providers, of all the medications you take. 7. Pain Medication from other Providers: You are not allowed to accept any additional pain medication from any other Doctor or Healthcare provider. There are two exceptions to this rule. (see below) In the event that you require additional pain medication, you are responsible for notifying us, as stated below. 8. Medication Agreement: You are responsible for carefully reading and following our Medication Agreement. This must be signed before receiving any prescriptions from our practice. Safely store a copy of your signed Agreement. Violations to the Agreement will result in no further prescriptions. (Additional copies of our Medication Agreement are available upon request.) 9. Laws, Rules, & Regulations: All patients are expected to follow all Federal and State Laws, Statutes, Rules, & Regulations. Ignorance of the Laws does not constitute a valid excuse. The use of any illegal substances is prohibited. 10. Adopted CDC guidelines & recommendations: Target dosing levels will be at or below 60 MME/day. Use of benzodiazepines** is not  recommended.  Exceptions: There are only two exceptions to the rule of not receiving pain medications from other Healthcare Providers. 1.   Exception #1 (Emergencies): In the event of an emergency (i.e.: accident requiring emergency care), you are allowed to receive additional pain medication. However, you are responsible for: As soon as you are able, call our office [phone], at any time of the day or night, and leave a message stating your name, the date and nature of the emergency, and the name and dose of the medication prescribed. In the event that your call is answered by a member of our staff, make sure to document and save the date, time, and the name of the person that took your information.  2. Exception #2 (Planned Surgery): In the event that you are scheduled by another doctor or dentist to have any type of surgery or procedure, you are allowed (for a period no longer than 30 days), to receive additional pain medication, for the acute post-op pain. However, in this case, you are responsible for picking up a copy of our "Post-op Pain Management for Surgeons" handout, and giving it to your surgeon or dentist. This document is available at our office, and does not require an appointment to obtain it. Simply go to our office during business hours (Monday-Thursday from 8:00 AM to 4:00 PM) (Friday 8:00 AM to 12:00 Noon) or if you have a scheduled appointment with [place], prior to your surgery, and ask for it by name. In addition, you will need to provide [place] with your name, name of your surgeon, type of surgery, and date of procedure or surgery.  *Opioid medications include: morphine, codeine, oxycodone, oxymorphone, hydrocodone, hydromorphone, meperidine, tramadol, tapentadol, buprenorphine, fentanyl, methadone. **Benzodiazepine medications include: diazepam (Valium), alprazolam (Xanax), clonazepam (Klonopine), lorazepam (Ativan), clorazepate (Tranxene), chlordiazepoxide (Librium), estazolam (Prosom),  oxazepam (Serax), temazepam (Restoril), triazolam (Halcion) (Last updated: 06/07/2017) ____________________________________________________________________________________________   ____________________________________________________________________________________________  General Risks and Possible Complications  Patient Responsibilities: It is important that you read this as it is part of your informed consent. It is our duty to inform you of the risks and possible complications associated with treatments offered to you. It is your responsibility as a patient to read this and to ask questions about anything that is not clear or that you believe was not covered in this document.  Patient's Rights: You have the right to refuse treatment. You also have the right to change your mind, even after initially having agreed to have the treatment done. However, under this last option, if you wait until the last second to change your mind, you may be charged for the materials used up to that point.  Introduction: Medicine is not an Chief Strategy Officer. Everything in Medicine, including the lack of treatment(s), carries the potential for danger, harm, or loss (which is by definition: Risk). In Medicine, a complication is a secondary problem, condition, or disease that can aggravate an already existing one. All treatments carry the risk of possible complications. The fact that a side effects or complications occurs, does not imply that the treatment was conducted incorrectly. It must be clearly understood that these can happen even when everything is done following the highest safety standards.  No treatment: You can choose not to proceed with the proposed treatment alternative. The "PRO(s)" would include: avoiding the risk of complications associated with the therapy. The "CON(s)" would include: not getting any of the treatment benefits. These benefits fall under one of three categories: diagnostic; therapeutic;  and/or palliative. Diagnostic benefits include: getting information which can ultimately lead to improvement of the disease or symptom(s). Therapeutic benefits are those associated with the  successful treatment of the disease. Finally, palliative benefits are those related to the decrease of the primary symptoms, without necessarily curing the condition (example: decreasing the pain from a flare-up of a chronic condition, such as incurable terminal cancer).  General Risks and Complications: These are associated to most interventional treatments. They can occur alone, or in combination. They fall under one of the following six (6) categories: no benefit or worsening of symptoms; bleeding; infection; nerve damage; allergic reactions; and/or death. 1. No benefits or worsening of symptoms: In Medicine there are no guarantees, only probabilities. No healthcare provider can ever guarantee that a medical treatment will work, they can only state the probability that it may. Furthermore, there is always the possibility that the condition may worsen, either directly, or indirectly, as a consequence of the treatment. 2. Bleeding: This is more common if the patient is taking a blood thinner, either prescription or over the counter (example: Goody Powders, Fish oil, Aspirin, Garlic, etc.), or if suffering a condition associated with impaired coagulation (example: Hemophilia, cirrhosis of the liver, low platelet counts, etc.). However, even if you do not have one on these, it can still happen. If you have any of these conditions, or take one of these drugs, make sure to notify your treating physician. 3. Infection: This is more common in patients with a compromised immune system, either due to disease (example: diabetes, cancer, human immunodeficiency virus [HIV], etc.), or due to medications or treatments (example: therapies used to treat cancer and rheumatological diseases). However, even if you do not have one on these,  it can still happen. If you have any of these conditions, or take one of these drugs, make sure to notify your treating physician. 4. Nerve Damage: This is more common when the treatment is an invasive one, but it can also happen with the use of medications, such as those used in the treatment of cancer. The damage can occur to small secondary nerves, or to large primary ones, such as those in the spinal cord and brain. This damage may be temporary or permanent and it may lead to impairments that can range from temporary numbness to permanent paralysis and/or brain death. 5. Allergic Reactions: Any time a substance or material comes in contact with our body, there is the possibility of an allergic reaction. These can range from a mild skin rash (contact dermatitis) to a severe systemic reaction (anaphylactic reaction), which can result in death. 6. Death: In general, any medical intervention can result in death, most of the time due to an unforeseen complication. ____________________________________________________________________________________________  ____________________________________________________________________________________________  Preparing for Procedure with Sedation  Instructions: . Oral Intake: Do not eat or drink anything for at least 8 hours prior to your procedure. . Transportation: Public transportation is not allowed. Bring an adult driver. The driver must be physically present in our waiting room before any procedure can be started. Marland Kitchen Physical Assistance: Bring an adult physically capable of assisting you, in the event you need help. This adult should keep you company at home for at least 6 hours after the procedure. . Blood Pressure Medicine: Take your blood pressure medicine with a sip of water the morning of the procedure. . Blood thinners: Notify our staff if you are taking any blood thinners. Depending on which one you take, there will be specific instructions on how and  when to stop it. . Diabetics on insulin: Notify the staff so that you can be scheduled 1st case in the morning. If your  diabetes requires high dose insulin, take only  of your normal insulin dose the morning of the procedure and notify the staff that you have done so. . Preventing infections: Shower with an antibacterial soap the morning of your procedure. . Build-up your immune system: Take 1000 mg of Vitamin C with every meal (3 times a day) the day prior to your procedure. Marland Kitchen Antibiotics: Inform the staff if you have a condition or reason that requires you to take antibiotics before dental procedures. . Pregnancy: If you are pregnant, call and cancel the procedure. . Sickness: If you have a cold, fever, or any active infections, call and cancel the procedure. . Arrival: You must be in the facility at least 30 minutes prior to your scheduled procedure. . Children: Do not bring children with you. . Dress appropriately: Bring dark clothing that you would not mind if they get stained. . Valuables: Do not bring any jewelry or valuables.  Procedure appointments are reserved for interventional treatments only. Marland Kitchen No Prescription Refills. . No medication changes will be discussed during procedure appointments. . No disability issues will be discussed.  Reasons to call and reschedule or cancel your procedure: (Following these recommendations will minimize the risk of a serious complication.) . Surgeries: Avoid having procedures within 2 weeks of any surgery. (Avoid for 2 weeks before or after any surgery). . Flu Shots: Avoid having procedures within 2 weeks of a flu shots or . (Avoid for 2 weeks before or after immunizations). . Barium: Avoid having a procedure within 7-10 days after having had a radiological study involving the use of radiological contrast. (Myelograms, Barium swallow or enema study). . Heart attacks: Avoid any elective procedures or surgeries for the initial 6 months after a  "Myocardial Infarction" (Heart Attack). . Blood thinners: It is imperative that you stop these medications before procedures. Let us know if you if you take any blood thinner.  . Infection: Avoid procedures during or within two weeks of an infection (including chest colds or gastrointestinal problems). Symptoms associated with infections include: Localized redness, fever, chills, night sweats or profuse sweating, burning sensation when voiding, cough, congestion, stuffiness, runny nose, sore throat, diarrhea, nausea, vomiting, cold or Flu symptoms, recent or current infections. It is specially important if the infection is over the area that we intend to treat. Marland Kitchen Heart and lung problems: Symptoms that may suggest an active cardiopulmonary problem include: cough, chest pain, breathing difficulties or shortness of breath, dizziness, ankle swelling, uncontrolled high or unusually low blood pressure, and/or palpitations. If you are experiencing any of these symptoms, cancel your procedure and contact your primary care physician for an evaluation.  Remember:  Regular Business hours are:  Monday to Thursday 8:00 AM to 4:00 PM  Provider's Schedule: Milinda Pointer, MD:  Procedure days: Tuesday and Thursday 7:30 AM to 4:00 PM  Gillis Santa, MD:  Procedure days: Monday and Wednesday 7:30 AM to 4:00 PM ____________________________________________________________________________________________  Facet Blocks Patient Information  Description: The facets are joints in the spine between the vertebrae.  Like any joints in the body, facets can become irritated and painful.  Arthritis can also effect the facets.  By injecting steroids and local anesthetic in and around these joints, we can temporarily block the nerve supply to them.  Steroids act directly on irritated nerves and tissues to reduce selling and inflammation which often leads to decreased pain.  Facet blocks may be done anywhere along the spine  from the neck to the low back depending  upon the location of your pain.   After numbing the skin with local anesthetic (like Novocaine), a small needle is passed onto the facet joints under x-ray guidance.  You may experience a sensation of pressure while this is being done.  The entire block usually lasts about 15-25 minutes.   Conditions which may be treated by facet blocks:   Low back/buttock pain  Neck/shoulder pain  Certain types of headaches  Preparation for the injection:  1. Do not eat any solid food or dairy products within 8 hours of your appointment. 2. You may drink clear liquid up to 3 hours before appointment.  Clear liquids include water, black coffee, juice or soda.  No milk or cream please. 3. You may take your regular medication, including pain medications, with a sip of water before your appointment.  Diabetics should hold regular insulin (if taken separately) and take 1/2 normal NPH dose the morning of the procedure.  Carry some sugar containing items with you to your appointment. 4. A driver must accompany you and be prepared to drive you home after your procedure. 5. Bring all your current medications with you. 6. An IV may be inserted and sedation may be given at the discretion of the physician. 7. A blood pressure cuff, EKG and other monitors will often be applied during the procedure.  Some patients may need to have extra oxygen administered for a short period. 8. You will be asked to provide medical information, including your allergies and medications, prior to the procedure.  We must know immediately if you are taking blood thinners (like Coumadin/Warfarin) or if you are allergic to IV iodine contrast (dye).  We must know if you could possible be pregnant.  Possible side-effects:   Bleeding from needle site  Infection (rare, may require surgery)  Nerve injury (rare)  Numbness & tingling (temporary)  Difficulty urinating (rare, temporary)  Spinal  headache (a headache worse with upright posture)  Light-headedness (temporary)  Pain at injection site (serveral days)  Decreased blood pressure (rare, temporary)  Weakness in arm/leg (temporary)  Pressure sensation in back/neck (temporary)   Call if you experience:   Fever/chills associated with headache or increased back/neck pain  Headache worsened by an upright position  New onset, weakness or numbness of an extremity below the injection site  Hives or difficulty breathing (go to the emergency room)  Inflammation or drainage at the injection site(s)  Severe back/neck pain greater than usual  New symptoms which are concerning to you  Please note:  Although the local anesthetic injected can often make your back or neck feel good for several hours after the injection, the pain will likely return. It takes 3-7 days for steroids to work.  You may not notice any pain relief for at least one week.  If effective, we will often do a series of 2-3 injections spaced 3-6 weeks apart to maximally decrease your pain.  After the initial series, you may be a candidate for a more permanent nerve block of the facets.  If you have any questions, please call #[phone]) [facility]  toradol 60 mg IM and norflex 60 mg IM given today.

## 2018-04-18 ENCOUNTER — Ambulatory Visit
Admission: RE | Admit: 2018-04-18 | Discharge: 2018-04-18 | Disposition: A | Discharge status: Home / Self Care | Payer: PPO | Source: Ambulatory Visit | Attending: Radiation Oncology | Admitting: Radiation Oncology

## 2018-04-18 DIAGNOSIS — E538 Deficiency of other specified B group vitamins: Secondary | ICD-10-CM | POA: Diagnosis not present

## 2018-04-18 DIAGNOSIS — C61 Malignant neoplasm of prostate: Secondary | ICD-10-CM | POA: Diagnosis not present

## 2018-04-19 ENCOUNTER — Ambulatory Visit
Admission: RE | Admit: 2018-04-19 | Discharge: 2018-04-19 | Disposition: A | Discharge status: Home / Self Care | Payer: PPO | Source: Ambulatory Visit | Attending: Radiation Oncology | Admitting: Radiation Oncology

## 2018-04-19 DIAGNOSIS — C61 Malignant neoplasm of prostate: Secondary | ICD-10-CM | POA: Diagnosis not present

## 2018-04-22 ENCOUNTER — Ambulatory Visit
Admission: RE | Admit: 2018-04-22 | Discharge: 2018-04-22 | Disposition: A | Discharge status: Home / Self Care | Payer: PPO | Source: Ambulatory Visit | Attending: Radiation Oncology | Admitting: Radiation Oncology

## 2018-04-22 DIAGNOSIS — C61 Malignant neoplasm of prostate: Secondary | ICD-10-CM | POA: Diagnosis not present

## 2018-04-23 ENCOUNTER — Ambulatory Visit: Payer: PPO

## 2018-04-23 ENCOUNTER — Inpatient Hospital Stay: Payer: PPO

## 2018-04-24 ENCOUNTER — Encounter: Payer: Self-pay | Admitting: Student in an Organized Health Care Education/Training Program

## 2018-04-24 ENCOUNTER — Ambulatory Visit
Admission: RE | Admit: 2018-04-24 | Discharge: 2018-04-24 | Disposition: A | Discharge status: Home / Self Care | Payer: PPO | Source: Ambulatory Visit | Attending: Student in an Organized Health Care Education/Training Program | Admitting: Student in an Organized Health Care Education/Training Program

## 2018-04-24 ENCOUNTER — Ambulatory Visit (HOSPITAL_BASED_OUTPATIENT_CLINIC_OR_DEPARTMENT_OTHER): Payer: PPO | Admitting: Student in an Organized Health Care Education/Training Program

## 2018-04-24 ENCOUNTER — Inpatient Hospital Stay: Payer: PPO | Attending: Radiation Oncology

## 2018-04-24 ENCOUNTER — Other Ambulatory Visit: Payer: Self-pay

## 2018-04-24 ENCOUNTER — Ambulatory Visit
Admission: RE | Admit: 2018-04-24 | Discharge: 2018-04-24 | Disposition: A | Discharge status: Home / Self Care | Payer: PPO | Source: Ambulatory Visit | Attending: Radiation Oncology | Admitting: Radiation Oncology

## 2018-04-24 DIAGNOSIS — G8929 Other chronic pain: Secondary | ICD-10-CM

## 2018-04-24 DIAGNOSIS — M533 Sacrococcygeal disorders, not elsewhere classified: Secondary | ICD-10-CM

## 2018-04-24 DIAGNOSIS — M47816 Spondylosis without myelopathy or radiculopathy, lumbar region: Secondary | ICD-10-CM | POA: Insufficient documentation

## 2018-04-24 DIAGNOSIS — C61 Malignant neoplasm of prostate: Secondary | ICD-10-CM

## 2018-04-24 DIAGNOSIS — Z510 Encounter for antineoplastic radiation therapy: Secondary | ICD-10-CM | POA: Insufficient documentation

## 2018-04-24 LAB — CBC
HCT: 44.7 % (ref 39.0–52.0)
HEMOGLOBIN: 14.8 g/dL (ref 13.0–17.0)
MCH: 31.6 pg (ref 26.0–34.0)
MCHC: 33.1 g/dL (ref 30.0–36.0)
MCV: 95.5 fL (ref 80.0–100.0)
Platelets: 172 10*3/uL (ref 150–400)
RBC: 4.68 MIL/uL (ref 4.22–5.81)
RDW: 12.5 % (ref 11.5–15.5)
WBC: 5.3 10*3/uL (ref 4.0–10.5)
nRBC: 0 % (ref 0.0–0.2)

## 2018-04-24 MED ORDER — ROPIVACAINE HCL 2 MG/ML IJ SOLN
10.0000 mL | Freq: Once | INTRAMUSCULAR | Status: AC
  Administered 2018-04-24: 10 mL
  Filled 2018-04-24: qty 10

## 2018-04-24 MED ORDER — LIDOCAINE HCL 2 % IJ SOLN
20.0000 mL | Freq: Once | INTRAMUSCULAR | Status: AC
  Administered 2018-04-24: 400 mg
  Filled 2018-04-24: qty 20

## 2018-04-24 MED ORDER — DEXAMETHASONE SODIUM PHOSPHATE 10 MG/ML IJ SOLN
10.0000 mg | Freq: Once | INTRAMUSCULAR | Status: AC
  Administered 2018-04-24: 10 mg
  Filled 2018-04-24: qty 1

## 2018-04-24 MED ORDER — LACTATED RINGERS IV SOLN
1000.0000 mL | Freq: Once | INTRAVENOUS | Status: AC
  Administered 2018-04-24: 1000 mL via INTRAVENOUS

## 2018-04-24 MED ORDER — FENTANYL CITRATE (PF) 100 MCG/2ML IJ SOLN
25.0000 ug | INTRAMUSCULAR | Status: DC | PRN
  Administered 2018-04-24: 75 ug via INTRAVENOUS
  Filled 2018-04-24: qty 2

## 2018-04-24 NOTE — Progress Notes (Signed)
Patient's Name: Gary [name]  MRN: [id number]  Referring Provider: Vevelyn Francois, NP  DOB: [birth year]  PCP: Kirk Ruths, MD  DOS: 04/24/2018  Note by: Gillis Santa, MD  Service setting: Ambulatory outpatient  Specialty: Interventional Pain Management  Patient type: Established  Location: [facility] (AMB) Pain Management Facility  Visit type: Interventional Procedure   Primary Reason for Visit: Interventional Pain Management Treatment. CC: Back Pain (low) and Hip Pain (right)  Procedure:          Anesthesia, Analgesia, Anxiolysis:  Type: Diagnostic Sacroiliac Joint Steroid Injection          Region: Inferior Lumbosacral Region Level: PIIS (Posterior Inferior Iliac Spine) Laterality: Right-Sided  Type: Moderate (Conscious) Sedation combined with Local Anesthesia Indication(s): Analgesia and Anxiety Route: Intravenous (IV) IV Access: Secured Sedation: Meaningful verbal contact was maintained at all times during the procedure  Local Anesthetic: Lidocaine 1-2%  Position: Prone           Indications: Right SI joint arthropathy.  Pain Score: Pre-procedure: 9 /10 Post-procedure: 0-No pain/10  Pre-op Assessment:  Gary [name] is a 68 y.o. (year old), male patient, seen today for interventional treatment. He  has a past surgical history that includes Cholecystectomy; Knee surgery (Left); deviated septum repair; Cardiac catheterization; Colonoscopy; Nasal septum surgery; Colonoscopy with propofol (N/A, 01/09/2018); and Esophagogastroduodenoscopy (egd) with propofol (N/A, 01/09/2018). Gary [name] has a current medication list which includes the following prescription(s): acetaminophen, albuterol sulfate, bupropion, cholecalciferol, clobetasol ointment, clotrimazole-betamethasone, diclofenac sodium, duloxetine, esomeprazole, ezetimibe, fenofibrate, finasteride, gabapentin, hydrochlorothiazide, ibuprofen, meloxicam, methocarbamol, mupirocin ointment, niacin-lovastatin, oxycodone-acetaminophen,  oxycodone-acetaminophen, pravastatin, sildenafil, and aspirin, and the following Facility-Administered Medications: fentanyl. His primarily concern today is the Back Pain (low) and Hip Pain (right)  Initial Vital Signs:  Pulse/HCG Rate: 92ECG Heart Rate: (!) 102 Temp: 97.8 F (36.6 C) Resp: 18 BP: (!) 126/105 SpO2: 96 %  BMI: Estimated body mass index is 32.99 kg/m as calculated from the following:   Height as of this encounter: 5\' 8"  (1.727 m).   Weight as of this encounter: 217 lb (98.4 kg).  Risk Assessment: Allergies: Reviewed. He is allergic to niacin-lovastatin er; atorvastatin; simvastatin; and propoxyphene.  Allergy Precautions: None required Coagulopathies: Reviewed. None identified.  Blood-thinner therapy: None at this time Active Infection(s): Reviewed. None identified. Gary [name] is afebrile  Site Confirmation: Gary [name] was asked to confirm the procedure and laterality before marking the site Procedure checklist: Completed Consent: Before the procedure and under the influence of no sedative(s), amnesic(s), or anxiolytics, the patient was informed of the treatment options, risks and possible complications. To fulfill our ethical and legal obligations, as recommended by the American Medical Association's Code of Ethics, I have informed the patient of my clinical impression; the nature and purpose of the treatment or procedure; the risks, benefits, and possible complications of the intervention; the alternatives, including doing nothing; the risk(s) and benefit(s) of the alternative treatment(s) or procedure(s); and the risk(s) and benefit(s) of doing nothing. The patient was provided information about the general risks and possible complications associated with the procedure. These may include, but are not limited to: failure to achieve desired goals, infection, bleeding, organ or nerve damage, allergic reactions, paralysis, and death. In addition, the patient was informed of  those risks and complications associated to the procedure, such as failure to decrease pain; infection; bleeding; organ or nerve damage with subsequent damage to sensory, motor, and/or autonomic systems, resulting in permanent pain, numbness, and/or weakness of one or several areas of  the body; allergic reactions; (i.e.: anaphylactic reaction); and/or death. Furthermore, the patient was informed of those risks and complications associated with the medications. These include, but are not limited to: allergic reactions (i.e.: anaphylactic or anaphylactoid reaction(s)); adrenal axis suppression; blood sugar elevation that in diabetics may result in ketoacidosis or comma; water retention that in patients with history of congestive heart failure may result in shortness of breath, pulmonary edema, and decompensation with resultant heart failure; weight gain; swelling or edema; medication-induced neural toxicity; particulate matter embolism and blood vessel occlusion with resultant organ, and/or nervous system infarction; and/or aseptic necrosis of one or more joints. Finally, the patient was informed that Medicine is not an exact science; therefore, there is also the possibility of unforeseen or unpredictable risks and/or possible complications that may result in a catastrophic outcome. The patient indicated having understood very clearly. We have given the patient no guarantees and we have made no promises. Enough time was given to the patient to ask questions, all of which were answered to the patient's satisfaction. Gary [name] has indicated that he wanted to continue with the procedure. Attestation: I, the ordering provider, attest that I have discussed with the patient the benefits, risks, side-effects, alternatives, likelihood of achieving goals, and potential problems during recovery for the procedure that I have provided informed consent. Date  Time: 04/24/2018 12:40 PM  Pre-Procedure Preparation:   Monitoring: As per clinic protocol. Respiration, ETCO2, SpO2, BP, heart rate and rhythm monitor placed and checked for adequate function Safety Precautions: Patient was assessed for positional comfort and pressure points before starting the procedure. Time-out: I initiated and conducted the "Time-out" before starting the procedure, as per protocol. The patient was asked to participate by confirming the accuracy of the "Time Out" information. Verification of the correct person, site, and procedure were performed and confirmed by me, the nursing staff, and the patient. "Time-out" conducted as per Joint Commission's Universal Protocol (UP.01.01.01). Time: 1346  Description of Procedure:          Target Area: Inferior, posterior, aspect of the sacroiliac fissure Approach: Posterior, paraspinal, ipsilateral approach. Area Prepped: Entire Lower Lumbosacral Region Prepping solution: ChloraPrep (2% chlorhexidine gluconate and 70% isopropyl alcohol) Safety Precautions: Aspiration looking for blood return was conducted prior to all injections. At no point did we inject any substances, as a needle was being advanced. No attempts were made at seeking any paresthesias. Safe injection practices and needle disposal techniques used. Medications properly checked for expiration dates. SDV (single dose vial) medications used. Description of the Procedure: Protocol guidelines were followed. The patient was placed in position over the procedure table. The target area was identified and the area prepped in the usual manner. Skin & deeper tissues infiltrated with local anesthetic. Appropriate amount of time allowed to pass for local anesthetics to take effect. The procedure needle was advanced under fluoroscopic guidance into the sacroiliac joint until a firm endpoint was obtained. Proper needle placement secured. Negative aspiration confirmed. Solution injected in intermittent fashion, asking for systemic symptoms every  0.5cc of injectate. The needles were then removed and the area cleansed, making sure to leave some of the prepping solution back to take advantage of its long term bactericidal properties. Vitals:   04/24/18 1401 04/24/18 1409 04/24/18 1418 04/24/18 1430  BP: (!) 133/108 (!) 153/97 136/90 (!) 130/98  Pulse:      Resp: 14 13 15 16   Temp:      TempSrc:      SpO2: 93% 96% 96% 96%  Weight:      Height:        Start Time: 1346 hrs. End Time: 1400 hrs. Materials:  Needle(s) Type: Spinal Needle Gauge: 22G Length: 3.5-in Medication(s): Please see orders for medications and dosing details. 5 cc solution made of 4 cc of 0.2% Ropivacaine, 1 cc of Decadron 10 mg/cc.  2.5 cc injected intra-articular, 2.5 cc injected periarticular. Imaging Guidance (Non-Spinal):          Type of Imaging Technique: Fluoroscopy Guidance (Non-Spinal) Indication(s): Assistance in needle guidance and placement for procedures requiring needle placement in or near specific anatomical locations not easily accessible without such assistance. Exposure Time: Please see nurses notes. Contrast: None used. Fluoroscopic Guidance: I was personally present during the use of fluoroscopy. "Tunnel Vision Technique" used to obtain the best possible view of the target area. Parallax error corrected before commencing the procedure. "Direction-depth-direction" technique used to introduce the needle under continuous pulsed fluoroscopy. Once target was reached, antero-posterior, oblique, and lateral fluoroscopic projection used confirm needle placement in all planes. Images permanently stored in EMR. Interpretation: No contrast injected.  Antibiotic Prophylaxis:   Anti-infectives (From admission, onward)   None     Indication(s): None identified  Post-operative Assessment:  Post-procedure Vital Signs:  Pulse/HCG Rate: 9296 Temp: 97.8 F (36.6 C) Resp: 16 BP: (!) 130/98 SpO2: 96 %  EBL: None  Complications: No immediate  post-treatment complications observed by team, or reported by patient.  Note: The patient tolerated the entire procedure well. A repeat set of vitals were taken after the procedure and the patient was kept under observation following institutional policy, for this type of procedure. Post-procedural neurological assessment was performed, showing return to baseline, prior to discharge. The patient was provided with post-procedure discharge instructions, including a section on how to identify potential problems. Should any problems arise concerning this procedure, the patient was given instructions to immediately contact us, at any time, without hesitation. In any case, we plan to contact the patient by telephone for a follow-up status report regarding this interventional procedure.  Comments:  No additional relevant information.  Plan of Care   Imaging Orders     DG C-Arm 1-60 Min-No Report Procedure Orders    No procedure(s) ordered today    Medications ordered for procedure: Meds ordered this encounter  Medications  . lactated ringers infusion 1,000 mL  . fentaNYL (SUBLIMAZE) injection 25-100 mcg    Make sure Narcan is available in the pyxis when using this medication. In the event of respiratory depression (RR< 8/min): Titrate NARCAN (naloxone) in increments of 0.1 to 0.2 mg IV at 2-3 minute intervals, until desired degree of reversal.  . ropivacaine (PF) 2 mg/mL (0.2%) (NAROPIN) injection 10 mL  . lidocaine (XYLOCAINE) 2 % (with pres) injection 400 mg  . dexamethasone (DECADRON) injection 10 mg  . dexamethasone (DECADRON) injection 10 mg   Medications administered: We administered lactated ringers, fentaNYL, ropivacaine (PF) 2 mg/mL (0.2%), lidocaine, dexamethasone, and dexamethasone.  See the medical record for exact dosing, route, and time of administration.  Disposition: Discharge home  Discharge Date & Time: 04/24/2018; 1430 hrs.   Patient to follow up with Dionisio David- if  procedures were not effective in reducing pain, ok to increase HC to 10 mg QID (from TID), esp since patient undergoing radiation therapy for prostate cancer which he states is resulting in increased pain  Future Appointments  Date Time Provider [place]  04/25/2018 11:15 AM [facility]-RO LINAC 1 [facility] None  04/26/2018 11:15 AM  [facility]RO LINAC 1 [facility] None  04/29/2018 10:30 AM B[facility]-LAB [facility] None  04/29/2018 11:15 AM [facility]-RO LINAC 1 [facility] None  04/30/2018 11:15 AM [facility]-RO LINAC 1 [facility] None  05/01/2018 11:15 AM [facility]-RO LINAC 1 [facility] None  05/02/2018 11:15 AM [facility]-RO LINAC 1 [facility] None  05/03/2018 10:00 AM [facility]-NURSE [facility] None  05/03/2018 11:15 AM [facility]-RO LINAC 1 [facility] None  05/06/2018 11:15 AM [facility]-RO LINAC 1 [facility] None  05/07/2018 11:00 AM [facility]-MO LAB [facility]MED[facility] None  05/07/2018 11:15 AM [facility]-RO LINAC 1 [facility] None  05/08/2018 11:15 AM [facility]-RO LINAC 1 [facility] None  05/09/2018 11:15 AM [facility]-RO LINAC 1 [facility] None  05/10/2018 11:15 AM [facility]-RO LINAC 1 [facility] None  05/13/2018 11:15 AM [facility]-RO LINAC 1 [facility] None  05/14/2018 11:15 AM [facility]-RO LINAC 1 [facility] None  05/15/2018 11:15 AM [facility]-RO LINAC 1 [facility] None  05/21/2018  7:30 AM CC[facility]-RO CT SIM [facility] None  05/21/2018  7:30 AM Chrystal, Eulas Post, MD [facility] None  05/28/2018 11:00 AM Vevelyn Francois, NP [facility] None  05/29/2018  8:00 AM [facility] PAT2 [facility] None  06/11/2018  7:30 AM [facility]-RO CT SIM [facility] None  07/09/2018  9:00 AM [facility]-RO CT SIM [facility] None  07/09/2018  9:00 AM Chrystal, Eulas Post, MD [facility] None   Primary Care Physician: Kirk Ruths, MD Location: [facility] Note by: Gillis Santa, MD Date: 04/24/2018; Time: 2:59 PM  Disclaimer:  Medicine is not an exact science. The only guarantee in medicine is that nothing is guaranteed. It is important to note that the decision to proceed with this intervention was  based on the information collected from the patient. The Data and conclusions were drawn from the patient's questionnaire, the interview, and the physical examination. Because the information was provided in large part by the patient, it cannot be guaranteed that it has not been purposely or unconsciously manipulated. Every effort has been made to obtain as much relevant data as possible for this evaluation. It is important to note that the conclusions that lead to this procedure are derived in large part from the available data. Always take into account that the treatment will also be dependent on availability of resources and existing treatment guidelines, considered by other Pain Management Practitioners as being common knowledge and practice, at the time of the intervention. For Medico-Legal purposes, it is also important to point out that variation in procedural techniques and pharmacological choices are the acceptable norm. The indications, contraindications, technique, and results of the above procedure should only be interpreted and judged by a Board-Certified Interventional Pain Specialist with extensive familiarity and expertise in the same exact procedure and technique.

## 2018-04-24 NOTE — Patient Instructions (Signed)

## 2018-04-24 NOTE — Progress Notes (Signed)
Patient's Name: Gary [name]  MRN: [id number]  Referring Provider: Vevelyn Francois, NP  DOB: Apr 11, 1950  PCP: Kirk Ruths, MD  DOS: 04/24/2018  Note by: Gillis Santa, MD  Service setting: Ambulatory outpatient  Specialty: Interventional Pain Management  Patient type: Established  Location: [facility] (AMB) Pain Management Facility  Visit type: Interventional Procedure   Primary Reason for Visit: Interventional Pain Management Treatment. CC: Back Pain (low) and Hip Pain (right)  Procedure:          Anesthesia, Analgesia, Anxiolysis:  Type: Lumbar Facet, Medial Branch Block(s) #2  Primary Purpose: Diagnostic Region: Posterolateral Lumbosacral Spine Level: L3, L4, L5, & S1 Medial Branch Level(s). Injecting these levels blocks the L3-4, L4-5, and L5-S1 lumbar facet joints. Laterality: Right  Type: Moderate (Conscious) Sedation combined with Local Anesthesia Indication(s): Analgesia and Anxiety Route: Intravenous (IV) IV Access: Secured Sedation: Meaningful verbal contact was maintained at all times during the procedure  Local Anesthetic: Lidocaine 1-2%  Position: Prone   Indications: 1. Spondylosis without myelopathy or radiculopathy, lumbar region   2. Chronic sacroiliac joint pain    Pain Score: Pre-procedure: 9 /10 Post-procedure: 0-No pain/10  Pre-op Assessment:  Gary [name] is a 68 y.o. (year old), male patient, seen today for interventional treatment. He  has a past surgical history that includes Cholecystectomy; Knee surgery (Left); deviated septum repair; Cardiac catheterization; Colonoscopy; Nasal septum surgery; Colonoscopy with propofol (N/A, 01/09/2018); and Esophagogastroduodenoscopy (egd) with propofol (N/A, 01/09/2018). Gary [name] has a current medication list which includes the following prescription(s): acetaminophen, albuterol sulfate, bupropion, cholecalciferol, clobetasol ointment, clotrimazole-betamethasone, diclofenac sodium, duloxetine, esomeprazole, ezetimibe,  fenofibrate, finasteride, gabapentin, hydrochlorothiazide, ibuprofen, meloxicam, methocarbamol, mupirocin ointment, niacin-lovastatin, oxycodone-acetaminophen, oxycodone-acetaminophen, pravastatin, sildenafil, and aspirin, and the following Facility-Administered Medications: fentanyl. His primarily concern today is the Back Pain (low) and Hip Pain (right)  Initial Vital Signs:  Pulse/HCG Rate: 92ECG Heart Rate: (!) 102 Temp: 97.8 F (36.6 C) Resp: 18 BP: (!) 126/105 SpO2: 96 %  BMI: Estimated body mass index is 32.99 kg/m as calculated from the following:   Height as of this encounter: 5\' 8"  (1.727 m).   Weight as of this encounter: 217 lb (98.4 kg).  Risk Assessment: Allergies: Reviewed. He is allergic to niacin-lovastatin er; atorvastatin; simvastatin; and propoxyphene.  Allergy Precautions: None required Coagulopathies: Reviewed. None identified.  Blood-thinner therapy: None at this time Active Infection(s): Reviewed. None identified. Gary [name] is afebrile  Site Confirmation: Gary [name] was asked to confirm the procedure and laterality before marking the site Procedure checklist: Completed Consent: Before the procedure and under the influence of no sedative(s), amnesic(s), or anxiolytics, the patient was informed of the treatment options, risks and possible complications. To fulfill our ethical and legal obligations, as recommended by the American Medical Association's Code of Ethics, I have informed the patient of my clinical impression; the nature and purpose of the treatment or procedure; the risks, benefits, and possible complications of the intervention; the alternatives, including doing nothing; the risk(s) and benefit(s) of the alternative treatment(s) or procedure(s); and the risk(s) and benefit(s) of doing nothing. The patient was provided information about the general risks and possible complications associated with the procedure. These may include, but are not limited to:  failure to achieve desired goals, infection, bleeding, organ or nerve damage, allergic reactions, paralysis, and death. In addition, the patient was informed of those risks and complications associated to Spine-related procedures, such as failure to decrease pain; infection (i.e.: Meningitis, epidural or intraspinal abscess); bleeding (i.e.: epidural hematoma, subarachnoid  hemorrhage, or any other type of intraspinal or peri-dural bleeding); organ or nerve damage (i.e.: Any type of peripheral nerve, nerve root, or spinal cord injury) with subsequent damage to sensory, motor, and/or autonomic systems, resulting in permanent pain, numbness, and/or weakness of one or several areas of the body; allergic reactions; (i.e.: anaphylactic reaction); and/or death. Furthermore, the patient was informed of those risks and complications associated with the medications. These include, but are not limited to: allergic reactions (i.e.: anaphylactic or anaphylactoid reaction(s)); adrenal axis suppression; blood sugar elevation that in diabetics may result in ketoacidosis or comma; water retention that in patients with history of congestive heart failure may result in shortness of breath, pulmonary edema, and decompensation with resultant heart failure; weight gain; swelling or edema; medication-induced neural toxicity; particulate matter embolism and blood vessel occlusion with resultant organ, and/or nervous system infarction; and/or aseptic necrosis of one or more joints. Finally, the patient was informed that Medicine is not an exact science; therefore, there is also the possibility of unforeseen or unpredictable risks and/or possible complications that may result in a catastrophic outcome. The patient indicated having understood very clearly. We have given the patient no guarantees and we have made no promises. Enough time was given to the patient to ask questions, all of which were answered to the patient's satisfaction. Mr.  [name] has indicated that he wanted to continue with the procedure. Attestation: I, the ordering provider, attest that I have discussed with the patient the benefits, risks, side-effects, alternatives, likelihood of achieving goals, and potential problems during recovery for the procedure that I have provided informed consent. Date  Time: 04/24/2018 12:40 PM  Pre-Procedure Preparation:  Monitoring: As per clinic protocol. Respiration, ETCO2, SpO2, BP, heart rate and rhythm monitor placed and checked for adequate function Safety Precautions: Patient was assessed for positional comfort and pressure points before starting the procedure. Time-out: I initiated and conducted the "Time-out" before starting the procedure, as per protocol. The patient was asked to participate by confirming the accuracy of the "Time Out" information. Verification of the correct person, site, and procedure were performed and confirmed by me, the nursing staff, and the patient. "Time-out" conducted as per Joint Commission's Universal Protocol (UP.01.01.01). Time: 1346  Description of Procedure:          Laterality: Right Levels:  L3, L4, L5, & S1 Medial Branch Level(s) Area Prepped: Posterior Lumbosacral Region Prepping solution: ChloraPrep (2% chlorhexidine gluconate and 70% isopropyl alcohol) Safety Precautions: Aspiration looking for blood return was conducted prior to all injections. At no point did we inject any substances, as a needle was being advanced. Before injecting, the patient was told to immediately notify me if he was experiencing any new onset of "ringing in the ears, or metallic taste in the mouth". No attempts were made at seeking any paresthesias. Safe injection practices and needle disposal techniques used. Medications properly checked for expiration dates. SDV (single dose vial) medications used. After the completion of the procedure, all disposable equipment used was discarded in the proper designated  medical waste containers. Local Anesthesia: Protocol guidelines were followed. The patient was positioned over the fluoroscopy table. The area was prepped in the usual manner. The time-out was completed. The target area was identified using fluoroscopy. A 12-in long, straight, sterile hemostat was used with fluoroscopic guidance to locate the targets for each level blocked. Once located, the skin was marked with an approved surgical skin marker. Once all sites were marked, the skin (epidermis, dermis, and hypodermis), as  well as deeper tissues (fat, connective tissue and muscle) were infiltrated with a small amount of a short-acting local anesthetic, loaded on a 10cc syringe with a 25G, 1.5-in  Needle. An appropriate amount of time was allowed for local anesthetics to take effect before proceeding to the next step. Local Anesthetic: Lidocaine 2.0% The unused portion of the local anesthetic was discarded in the proper designated containers. Technical explanation of process:   L3 Medial Branch Nerve Block (MBB): The target area for the L3 medial branch is at the junction of the postero-lateral aspect of the superior articular process and the superior, posterior, and medial edge of the transverse process of L4. Under fluoroscopic guidance, a Quincke needle was inserted until contact was made with os over the superior postero-lateral aspect of the pedicular shadow (target area). After negative aspiration for blood, 83mL of the nerve block solution was injected without difficulty or complication. The needle was removed intact. L4 Medial Branch Nerve Block (MBB): The target area for the L4 medial branch is at the junction of the postero-lateral aspect of the superior articular process and the superior, posterior, and medial edge of the transverse process of L5. Under fluoroscopic guidance, a Quincke needle was inserted until contact was made with os over the superior postero-lateral aspect of the pedicular shadow  (target area). After negative aspiration for blood, 1 mL of the nerve block solution was injected without difficulty or complication. The needle was removed intact. L5 Medial Branch Nerve Block (MBB): The target area for the L5 medial branch is at the junction of the postero-lateral aspect of the superior articular process and the superior, posterior, and medial edge of the sacral ala. Under fluoroscopic guidance, a Quincke needle was inserted until contact was made with os over the superior postero-lateral aspect of the pedicular shadow (target area). After negative aspiration for blood, 30mL of the nerve block solution was injected without difficulty or complication. The needle was removed intact. S1 Medial Branch Nerve Block (MBB): The target area for the S1 medial branch is at the posterior and inferior 6 o'clock position of the L5-S1 facet joint. Under fluoroscopic guidance, the Quincke needle inserted for the L5 MBB was redirected until contact was made with os over the inferior and postero aspect of the sacrum, at the 6 o' clock position under the L5-S1 facet joint (Target area). After negative aspiration for blood, 1 mL of the nerve block solution was injected without difficulty or complication. The needle was removed intact. Procedural Needles: 22-gauge, 3.5-inch, Quincke needles used for all levels. Nerve block solution: 10 cc solution made of 9 cc of 0.2% ropivacaine, 1 cc of Decadron 10 mg/cc.  1 to 1.5 cc injected at each level.  The unused portion of the solution was discarded in the proper designated containers.  Once the entire procedure was completed, the treated area was cleaned, making sure to leave some of the prepping solution back to take advantage of its long term bactericidal properties.   Illustration of the posterior view of the lumbar spine and the posterior neural structures. Laminae of L2 through S1 are labeled. DPRL5, dorsal primary ramus of L5; DPRS1, dorsal primary ramus of  S1; DPR3, dorsal primary ramus of L3; FJ, facet (zygapophyseal) joint L3-L4; I, inferior articular process of L4; LB1, lateral branch of dorsal primary ramus of L1; IAB, inferior articular branches from L3 medial branch (supplies L4-L5 facet joint); IBP, intermediate branch plexus; MB3, medial branch of dorsal primary ramus of L3; NR3, third lumbar  nerve root; S, superior articular process of L5; SAB, superior articular branches from L4 (supplies L4-5 facet joint also); TP3, transverse process of L3.  Vitals:   04/24/18 1401 04/24/18 1409 04/24/18 1418 04/24/18 1430  BP: (!) 133/108 (!) 153/97 136/90 (!) 130/98  Pulse:      Resp: 14 13 15 16   Temp:      TempSrc:      SpO2: 93% 96% 96% 96%  Weight:      Height:         Start Time: 1346 hrs. End Time: 1400 hrs.  Imaging Guidance (Spinal):          Type of Imaging Technique: Fluoroscopy Guidance (Spinal) Indication(s): Assistance in needle guidance and placement for procedures requiring needle placement in or near specific anatomical locations not easily accessible without such assistance. Exposure Time: Please see nurses notes. Contrast: None used. Fluoroscopic Guidance: I was personally present during the use of fluoroscopy. "Tunnel Vision Technique" used to obtain the best possible view of the target area. Parallax error corrected before commencing the procedure. "Direction-depth-direction" technique used to introduce the needle under continuous pulsed fluoroscopy. Once target was reached, antero-posterior, oblique, and lateral fluoroscopic projection used confirm needle placement in all planes. Images permanently stored in EMR. Interpretation: No contrast injected. I personally interpreted the imaging intraoperatively. Adequate needle placement confirmed in multiple planes. Permanent images saved into the patient's record.  Antibiotic Prophylaxis:   Anti-infectives (From admission, onward)   None     Indication(s): None  identified  Post-operative Assessment:  Post-procedure Vital Signs:  Pulse/HCG Rate: 9296 Temp: 97.8 F (36.6 C) Resp: 16 BP: (!) 130/98 SpO2: 96 %  EBL: None  Complications: No immediate post-treatment complications observed by team, or reported by patient.  Note: The patient tolerated the entire procedure well. A repeat set of vitals were taken after the procedure and the patient was kept under observation following institutional policy, for this type of procedure. Post-procedural neurological assessment was performed, showing return to baseline, prior to discharge. The patient was provided with post-procedure discharge instructions, including a section on how to identify potential problems. Should any problems arise concerning this procedure, the patient was given instructions to immediately contact us, at any time, without hesitation. In any case, we plan to contact the patient by telephone for a follow-up status report regarding this interventional procedure.  Comments:  No additional relevant information.  Plan of Care  Patient to follow up with Dionisio David- if procedures were not effective in reducing pain, ok to increase HC to 10 mg QID (from TID), esp since patient undergoing radiation therapy for prostate cancer which he states is resulting in increased pain   Imaging Orders     DG C-Arm 1-60 Min-No Report Procedure Orders    No procedure(s) ordered today    Medications ordered for procedure: Meds ordered this encounter  Medications  . lactated ringers infusion 1,000 mL  . fentaNYL (SUBLIMAZE) injection 25-100 mcg    Make sure Narcan is available in the pyxis when using this medication. In the event of respiratory depression (RR< 8/min): Titrate NARCAN (naloxone) in increments of 0.1 to 0.2 mg IV at 2-3 minute intervals, until desired degree of reversal.  . ropivacaine (PF) 2 mg/mL (0.2%) (NAROPIN) injection 10 mL  . lidocaine (XYLOCAINE) 2 % (with pres) injection 400  mg  . dexamethasone (DECADRON) injection 10 mg  . dexamethasone (DECADRON) injection 10 mg   Medications administered: We administered lactated ringers, fentaNYL, ropivacaine (PF) 2  mg/mL (0.2%), lidocaine, dexamethasone, and dexamethasone.  See the medical record for exact dosing, route, and time of administration.  Disposition: Discharge home  Discharge Date & Time: 04/24/2018; 1430 hrs.   Physician-requested Follow-up: No follow-ups on file.  Future Appointments  Date Time Provider [place]  04/25/2018 11:15 AM CCAR-RO LINAC 1 [facility]RAD[facility] None  04/26/2018 11:15 AM CCAR-RO LINAC 1 [facility] None  04/29/2018 10:30 AM BUA-LAB [facility] None  04/29/2018 11:15 AM CCAR-RO LINAC 1 [facility] None  04/30/2018 11:15 AM CCAR-RO LINAC 1 [facility] None  05/01/2018 11:15 AM CCAR-RO LINAC 1 [facility] None  05/02/2018 11:15 AM CCAR-RO LINAC 1 [facility] None  05/03/2018 10:00 AM BUA-NURSE [facility]-BUA None  05/03/2018 11:15 AM CCAR-RO LINAC 1 [facility]RAD[facility] None  05/06/2018 11:15 AM CCAR-RO LINAC 1 [facility] None  05/07/2018 11:00 AM CCAR-MO LAB [facility]-MEDONC None  05/07/2018 11:15 AM CCAR-RO LINAC 1 [facility] None  05/08/2018 11:15 AM CCAR-RO LINAC 1 [facility] None  05/09/2018 11:15 AM CCAR-RO LINAC 1 [facility] None  05/10/2018 11:15 AM CCAR-RO LINAC 1 [facility] None  05/13/2018 11:15 AM CCAR-RO LINAC 1 [facility] None  05/14/2018 11:15 AM CCAR-RO LINAC 1 [facility] None  05/15/2018 11:15 AM CCAR-RO LINAC 1 [facility] None  05/21/2018  7:30 AM CCAR-RO CT SIM [facility]RAD[facility] None  05/21/2018  7:30 AM Chrystal, Eulas Post, MD [facility] None  05/28/2018 11:00 AM Vevelyn Francois, NP [facility] None  05/29/2018  8:00 AM [facility] PAT2 [facility] None  06/11/2018  7:30 AM CCAR-RO CT SIM [facility] None  07/09/2018  9:00 AM CCAR-RO CT SIM [facility] None  07/09/2018  9:00 AM Chrystal, Eulas Post, MD [facility] None   Primary Care Physician: Kirk Ruths, MD Location: [facility] Outpatient Pain Management  Facility Note by: Gillis Santa, MD Date: 04/24/2018; Time: 3:03 PM  Disclaimer:  Medicine is not an exact science. The only guarantee in medicine is that nothing is guaranteed. It is important to note that the decision to proceed with this intervention was based on the information collected from the patient. The Data and conclusions were drawn from the patient's questionnaire, the interview, and the physical examination. Because the information was provided in large part by the patient, it cannot be guaranteed that it has not been purposely or unconsciously manipulated. Every effort has been made to obtain as much relevant data as possible for this evaluation. It is important to note that the conclusions that lead to this procedure are derived in large part from the available data. Always take into account that the treatment will also be dependent on availability of resources and existing treatment guidelines, considered by other Pain Management Practitioners as being common knowledge and practice, at the time of the intervention. For Medico-Legal purposes, it is also important to point out that variation in procedural techniques and pharmacological choices are the acceptable norm. The indications, contraindications, technique, and results of the above procedure should only be interpreted and judged by a Board-Certified Interventional Pain Specialist with extensive familiarity and expertise in the same exact procedure and technique.

## 2018-04-24 NOTE — Progress Notes (Signed)
Safety precautions to be maintained throughout the outpatient stay will include: orient to surroundings, keep bed in low position, maintain call bell within reach at all times, provide assistance with transfer out of bed and ambulation.  

## 2018-04-25 ENCOUNTER — Ambulatory Visit
Admission: RE | Admit: 2018-04-25 | Discharge: 2018-04-25 | Disposition: A | Discharge status: Home / Self Care | Payer: PPO | Source: Ambulatory Visit | Attending: Radiation Oncology | Admitting: Radiation Oncology

## 2018-04-25 ENCOUNTER — Telehealth: Payer: Self-pay | Admitting: Student in an Organized Health Care Education/Training Program

## 2018-04-25 ENCOUNTER — Ambulatory Visit: Payer: PPO | Admitting: Pain Medicine

## 2018-04-25 ENCOUNTER — Telehealth: Payer: Self-pay | Admitting: *Deleted

## 2018-04-25 DIAGNOSIS — C61 Malignant neoplasm of prostate: Secondary | ICD-10-CM | POA: Diagnosis not present

## 2018-04-25 NOTE — Telephone Encounter (Signed)
Informed patient that these symptoms are not related to the esi.

## 2018-04-25 NOTE — Telephone Encounter (Signed)
Post procedure call, no questions or concerns.

## 2018-04-25 NOTE — Telephone Encounter (Signed)
Pt called and requested to speak with Cecille Rubin. He stated that he forgot to tell her that his index finger and middle finger were sticking together and he was unable to move them last night and wanted to know if this was from the epidural?

## 2018-04-26 ENCOUNTER — Ambulatory Visit
Admission: RE | Admit: 2018-04-26 | Discharge: 2018-04-26 | Disposition: A | Discharge status: Home / Self Care | Payer: PPO | Source: Ambulatory Visit | Attending: Radiation Oncology | Admitting: Radiation Oncology

## 2018-04-26 DIAGNOSIS — C61 Malignant neoplasm of prostate: Secondary | ICD-10-CM | POA: Diagnosis not present

## 2018-04-29 ENCOUNTER — Other Ambulatory Visit: Payer: PPO

## 2018-04-29 ENCOUNTER — Ambulatory Visit
Admission: RE | Admit: 2018-04-29 | Discharge: 2018-04-29 | Disposition: A | Discharge status: Home / Self Care | Payer: PPO | Source: Ambulatory Visit | Attending: Radiation Oncology | Admitting: Radiation Oncology

## 2018-04-29 DIAGNOSIS — C61 Malignant neoplasm of prostate: Secondary | ICD-10-CM | POA: Diagnosis not present

## 2018-04-30 ENCOUNTER — Other Ambulatory Visit: Payer: Self-pay | Admitting: *Deleted

## 2018-04-30 ENCOUNTER — Ambulatory Visit
Admission: RE | Admit: 2018-04-30 | Discharge: 2018-04-30 | Disposition: A | Discharge status: Home / Self Care | Payer: PPO | Source: Ambulatory Visit | Attending: Radiation Oncology | Admitting: Radiation Oncology

## 2018-04-30 DIAGNOSIS — C61 Malignant neoplasm of prostate: Secondary | ICD-10-CM | POA: Diagnosis not present

## 2018-04-30 DIAGNOSIS — I2510 Atherosclerotic heart disease of native coronary artery without angina pectoris: Secondary | ICD-10-CM | POA: Diagnosis not present

## 2018-04-30 DIAGNOSIS — I10 Essential (primary) hypertension: Secondary | ICD-10-CM | POA: Diagnosis not present

## 2018-04-30 DIAGNOSIS — E782 Mixed hyperlipidemia: Secondary | ICD-10-CM | POA: Diagnosis not present

## 2018-04-30 DIAGNOSIS — G4733 Obstructive sleep apnea (adult) (pediatric): Secondary | ICD-10-CM | POA: Diagnosis not present

## 2018-04-30 DIAGNOSIS — Z01818 Encounter for other preprocedural examination: Secondary | ICD-10-CM | POA: Diagnosis not present

## 2018-04-30 LAB — TESTOSTERONE: Testosterone: <3 ng/dL — ABNORMAL LOW (ref 264–916)

## 2018-04-30 LAB — PSA: Prostate Specific Ag, Serum: 0.2 ng/mL (ref 0.0–4.0)

## 2018-04-30 MED ORDER — TAMSULOSIN HCL 0.4 MG PO CAPS: 0.4000 mg | ORAL_CAPSULE | Freq: Every day | ORAL | 6 refills | Status: DC

## 2018-05-01 ENCOUNTER — Ambulatory Visit
Admission: RE | Admit: 2018-05-01 | Discharge: 2018-05-01 | Disposition: A | Discharge status: Home / Self Care | Payer: PPO | Source: Ambulatory Visit | Attending: Radiation Oncology | Admitting: Radiation Oncology

## 2018-05-01 DIAGNOSIS — C61 Malignant neoplasm of prostate: Secondary | ICD-10-CM | POA: Diagnosis not present

## 2018-05-02 ENCOUNTER — Ambulatory Visit
Admission: RE | Admit: 2018-05-02 | Discharge: 2018-05-02 | Disposition: A | Discharge status: Home / Self Care | Payer: PPO | Source: Ambulatory Visit | Attending: Radiation Oncology | Admitting: Radiation Oncology

## 2018-05-02 DIAGNOSIS — C61 Malignant neoplasm of prostate: Secondary | ICD-10-CM | POA: Diagnosis not present

## 2018-05-03 ENCOUNTER — Ambulatory Visit
Admission: RE | Admit: 2018-05-03 | Discharge: 2018-05-03 | Disposition: A | Discharge status: Home / Self Care | Payer: PPO | Source: Ambulatory Visit | Attending: Radiation Oncology | Admitting: Radiation Oncology

## 2018-05-03 ENCOUNTER — Ambulatory Visit (INDEPENDENT_AMBULATORY_CARE_PROVIDER_SITE_OTHER): Payer: PPO | Admitting: Family Medicine

## 2018-05-03 DIAGNOSIS — C61 Malignant neoplasm of prostate: Secondary | ICD-10-CM | POA: Diagnosis not present

## 2018-05-03 MED ORDER — LEUPROLIDE ACETATE (6 MONTH) 45 MG IM KIT
45.0000 mg | PACK | Freq: Once | INTRAMUSCULAR | Status: AC
  Administered 2018-05-03: 45 mg via INTRAMUSCULAR

## 2018-05-03 NOTE — Progress Notes (Signed)
Lupron IM Injection   Due to Prostate Cancer patient is present today for a Lupron Injection.  Medication: Lupron 6 month Dose:45 mg  Location: left upper outer buttocks Lot: [id number] Exp: 03/02/2020  Patient tolerated well, no complications were noted  Performed by: Elberta Leatherwood, CMA  Follow up: 6 months

## 2018-05-06 ENCOUNTER — Ambulatory Visit: Payer: PPO

## 2018-05-07 ENCOUNTER — Ambulatory Visit: Payer: PPO

## 2018-05-07 ENCOUNTER — Other Ambulatory Visit: Payer: Self-pay | Admitting: *Deleted

## 2018-05-07 ENCOUNTER — Ambulatory Visit: Admission: RE | Admit: 2018-05-07 | Payer: PPO | Source: Home / Self Care | Admitting: Urology

## 2018-05-07 ENCOUNTER — Encounter: Admission: RE | Payer: Self-pay | Source: Home / Self Care

## 2018-05-07 ENCOUNTER — Ambulatory Visit: Payer: Non-veteran care | Admitting: Radiation Oncology

## 2018-05-07 ENCOUNTER — Inpatient Hospital Stay: Payer: PPO

## 2018-05-07 SURGERY — ULTRASOUND, PROSTATE, FOR VOLUME DETERMINATION
Anesthesia: Choice

## 2018-05-08 ENCOUNTER — Ambulatory Visit
Admission: RE | Admit: 2018-05-08 | Discharge: 2018-05-08 | Disposition: A | Discharge status: Home / Self Care | Payer: PPO | Source: Ambulatory Visit | Attending: Radiation Oncology | Admitting: Radiation Oncology

## 2018-05-08 ENCOUNTER — Inpatient Hospital Stay: Payer: PPO

## 2018-05-08 DIAGNOSIS — C61 Malignant neoplasm of prostate: Secondary | ICD-10-CM

## 2018-05-08 LAB — CBC
HCT: 42.4 % (ref 39.0–52.0)
Hemoglobin: 14.3 g/dL (ref 13.0–17.0)
MCH: 31.9 pg (ref 26.0–34.0)
MCHC: 33.7 g/dL (ref 30.0–36.0)
MCV: 94.6 fL (ref 80.0–100.0)
Platelets: 229 10*3/uL (ref 150–400)
RBC: 4.48 MIL/uL (ref 4.22–5.81)
RDW: 12.7 % (ref 11.5–15.5)
WBC: 6.2 10*3/uL (ref 4.0–10.5)
nRBC: 0 % (ref 0.0–0.2)

## 2018-05-09 ENCOUNTER — Ambulatory Visit
Admission: RE | Admit: 2018-05-09 | Discharge: 2018-05-09 | Disposition: A | Discharge status: Home / Self Care | Payer: PPO | Source: Ambulatory Visit | Attending: Radiation Oncology | Admitting: Radiation Oncology

## 2018-05-09 DIAGNOSIS — C61 Malignant neoplasm of prostate: Secondary | ICD-10-CM | POA: Diagnosis not present

## 2018-05-10 ENCOUNTER — Ambulatory Visit
Admission: RE | Admit: 2018-05-10 | Discharge: 2018-05-10 | Disposition: A | Discharge status: Home / Self Care | Payer: PPO | Source: Ambulatory Visit | Attending: Radiation Oncology | Admitting: Radiation Oncology

## 2018-05-10 DIAGNOSIS — C61 Malignant neoplasm of prostate: Secondary | ICD-10-CM | POA: Diagnosis not present

## 2018-05-13 ENCOUNTER — Ambulatory Visit: Payer: PPO

## 2018-05-13 ENCOUNTER — Ambulatory Visit
Admission: RE | Admit: 2018-05-13 | Discharge: 2018-05-13 | Disposition: A | Discharge status: Home / Self Care | Payer: PPO | Source: Ambulatory Visit | Attending: Radiation Oncology | Admitting: Radiation Oncology

## 2018-05-13 DIAGNOSIS — C61 Malignant neoplasm of prostate: Secondary | ICD-10-CM | POA: Insufficient documentation

## 2018-05-13 DIAGNOSIS — Z510 Encounter for antineoplastic radiation therapy: Secondary | ICD-10-CM | POA: Insufficient documentation

## 2018-05-14 ENCOUNTER — Ambulatory Visit: Payer: PPO

## 2018-05-14 ENCOUNTER — Telehealth: Payer: Self-pay | Admitting: *Deleted

## 2018-05-14 ENCOUNTER — Ambulatory Visit
Admission: RE | Admit: 2018-05-14 | Discharge: 2018-05-14 | Disposition: A | Discharge status: Home / Self Care | Payer: PPO | Source: Ambulatory Visit | Attending: Radiation Oncology | Admitting: Radiation Oncology

## 2018-05-14 DIAGNOSIS — G720 Drug-induced myopathy: Secondary | ICD-10-CM | POA: Insufficient documentation

## 2018-05-14 DIAGNOSIS — T466X5A Adverse effect of antihyperlipidemic and antiarteriosclerotic drugs, initial encounter: Secondary | ICD-10-CM | POA: Insufficient documentation

## 2018-05-14 DIAGNOSIS — F325 Major depressive disorder, single episode, in full remission: Secondary | ICD-10-CM | POA: Diagnosis not present

## 2018-05-14 DIAGNOSIS — C61 Malignant neoplasm of prostate: Secondary | ICD-10-CM | POA: Diagnosis not present

## 2018-05-14 DIAGNOSIS — M791 Myalgia, unspecified site: Secondary | ICD-10-CM | POA: Insufficient documentation

## 2018-05-14 DIAGNOSIS — Z789 Other specified health status: Secondary | ICD-10-CM | POA: Diagnosis not present

## 2018-05-14 DIAGNOSIS — E538 Deficiency of other specified B group vitamins: Secondary | ICD-10-CM | POA: Diagnosis not present

## 2018-05-14 DIAGNOSIS — G4733 Obstructive sleep apnea (adult) (pediatric): Secondary | ICD-10-CM | POA: Diagnosis not present

## 2018-05-14 DIAGNOSIS — Z122 Encounter for screening for malignant neoplasm of respiratory organs: Secondary | ICD-10-CM

## 2018-05-14 DIAGNOSIS — I10 Essential (primary) hypertension: Secondary | ICD-10-CM | POA: Diagnosis not present

## 2018-05-14 DIAGNOSIS — E782 Mixed hyperlipidemia: Secondary | ICD-10-CM | POA: Diagnosis not present

## 2018-05-14 DIAGNOSIS — Z510 Encounter for antineoplastic radiation therapy: Secondary | ICD-10-CM | POA: Diagnosis not present

## 2018-05-14 DIAGNOSIS — I2510 Atherosclerotic heart disease of native coronary artery without angina pectoris: Secondary | ICD-10-CM | POA: Diagnosis not present

## 2018-05-14 DIAGNOSIS — E118 Type 2 diabetes mellitus with unspecified complications: Secondary | ICD-10-CM | POA: Diagnosis not present

## 2018-05-14 DIAGNOSIS — Z87891 Personal history of nicotine dependence: Secondary | ICD-10-CM

## 2018-05-14 NOTE — Telephone Encounter (Signed)
Received self referral for initial lung cancer screening scan. Contacted patient and obtained smoking history,(former, quit 05/21/18, 90 pack year) as well as answering questions related to screening process. Patient denies signs of lung cancer such as weight loss or hemoptysis. Patient denies comorbidity that would prevent curative treatment if lung cancer were found. Patient is scheduled for shared decision making visit and CT scan on 05/21/18 at 230pm.

## 2018-05-15 ENCOUNTER — Ambulatory Visit: Payer: PPO

## 2018-05-15 ENCOUNTER — Ambulatory Visit
Admission: RE | Admit: 2018-05-15 | Discharge: 2018-05-15 | Disposition: A | Discharge status: Home / Self Care | Payer: PPO | Source: Ambulatory Visit | Attending: Radiation Oncology | Admitting: Radiation Oncology

## 2018-05-15 DIAGNOSIS — Z510 Encounter for antineoplastic radiation therapy: Secondary | ICD-10-CM | POA: Diagnosis not present

## 2018-05-15 DIAGNOSIS — C61 Malignant neoplasm of prostate: Secondary | ICD-10-CM | POA: Diagnosis not present

## 2018-05-16 ENCOUNTER — Ambulatory Visit
Admission: RE | Admit: 2018-05-16 | Discharge: 2018-05-16 | Disposition: A | Discharge status: Home / Self Care | Payer: PPO | Source: Ambulatory Visit | Attending: Radiation Oncology | Admitting: Radiation Oncology

## 2018-05-16 ENCOUNTER — Ambulatory Visit: Payer: PPO

## 2018-05-16 DIAGNOSIS — Z510 Encounter for antineoplastic radiation therapy: Secondary | ICD-10-CM | POA: Diagnosis not present

## 2018-05-16 DIAGNOSIS — C61 Malignant neoplasm of prostate: Secondary | ICD-10-CM | POA: Diagnosis not present

## 2018-05-17 ENCOUNTER — Ambulatory Visit: Payer: PPO

## 2018-05-20 ENCOUNTER — Ambulatory Visit
Admission: RE | Admit: 2018-05-20 | Discharge: 2018-05-20 | Disposition: A | Discharge status: Home / Self Care | Payer: PPO | Source: Ambulatory Visit | Attending: Radiation Oncology | Admitting: Radiation Oncology

## 2018-05-20 DIAGNOSIS — Z510 Encounter for antineoplastic radiation therapy: Secondary | ICD-10-CM | POA: Diagnosis not present

## 2018-05-20 DIAGNOSIS — E538 Deficiency of other specified B group vitamins: Secondary | ICD-10-CM | POA: Diagnosis not present

## 2018-05-20 DIAGNOSIS — C61 Malignant neoplasm of prostate: Secondary | ICD-10-CM | POA: Diagnosis not present

## 2018-05-21 ENCOUNTER — Ambulatory Visit
Admission: RE | Admit: 2018-05-21 | Discharge: 2018-05-21 | Disposition: A | Discharge status: Home / Self Care | Payer: PPO | Source: Ambulatory Visit | Attending: Oncology | Admitting: Oncology

## 2018-05-21 ENCOUNTER — Encounter: Payer: Self-pay | Admitting: Nurse Practitioner

## 2018-05-21 ENCOUNTER — Inpatient Hospital Stay: Payer: Non-veteran care | Attending: Oncology | Admitting: Nurse Practitioner

## 2018-05-21 ENCOUNTER — Ambulatory Visit: Payer: PPO | Admitting: Radiation Oncology

## 2018-05-21 ENCOUNTER — Ambulatory Visit
Admission: RE | Admit: 2018-05-21 | Discharge: 2018-05-21 | Disposition: A | Discharge status: Home / Self Care | Payer: PPO | Source: Ambulatory Visit | Attending: Radiation Oncology | Admitting: Radiation Oncology

## 2018-05-21 DIAGNOSIS — Z122 Encounter for screening for malignant neoplasm of respiratory organs: Secondary | ICD-10-CM | POA: Insufficient documentation

## 2018-05-21 DIAGNOSIS — Z87891 Personal history of nicotine dependence: Secondary | ICD-10-CM | POA: Insufficient documentation

## 2018-05-21 NOTE — Progress Notes (Signed)
In accordance with CMS guidelines, patient has met eligibility criteria including age, absence of signs or symptoms of lung cancer.  Social History   Tobacco Use  . Smoking status: Former Smoker    Packs/day: 1.50    Years: 45.00    Pack years: 67.50    Types: Cigarettes    Last attempt to quit: 2014    Years since quitting: 6.1  . Smokeless tobacco: Never Used  Substance Use Topics  . Alcohol use: No  . Drug use: No      A shared decision-making session was conducted prior to the performance of CT scan. This includes one or more decision aids, includes benefits and harms of screening, follow-up diagnostic testing, over-diagnosis, false positive rate, and total radiation exposure.   Counseling on the importance of adherence to annual lung cancer LDCT screening, impact of co-morbidities, and ability or willingness to undergo diagnosis and treatment is imperative for compliance of the program.   Counseling on the importance of continued smoking cessation for former smokers; the importance of smoking cessation for current smokers, and information about tobacco cessation interventions have been given to patient including [place] and 1800 quit [place] programs.   Written order for lung cancer screening with LDCT has been given to the patient and any and all questions have been answered to the best of my abilities.    Yearly follow up will be coordinated by Burgess Estelle, Thoracic Navigator.  Beckey Rutter, DNP, AGNP-C [place] [facility] [phone] (work cell) [phone] (office) 05/21/18 3:07 PM

## 2018-05-23 ENCOUNTER — Telehealth: Payer: Self-pay | Admitting: *Deleted

## 2018-05-23 NOTE — Telephone Encounter (Signed)
Notified patient of LDCT lung cancer screening program results with recommendation for 12 month follow up imaging. Also notified of incidental findings noted below and is encouraged to discuss further with PCP who will receive a copy of this note and/or the CT report. Patient verbalizes understanding.   IMPRESSION: 1. Lung-RADS 2, benign appearance or behavior. Continue annual screening with low-dose chest CT without contrast in 12 months. 2. Mild diffuse bronchial wall thickening with mild centrilobular and paraseptal emphysema; imaging findings suggestive of underlying COPD. 3. Aortic atherosclerosis. 4. Hepatic steatosis.  Aortic Atherosclerosis (ICD10-I70.0) and Emphysema (ICD10-J43.9).

## 2018-05-28 ENCOUNTER — Encounter: Payer: Self-pay | Admitting: Nurse Practitioner

## 2018-05-28 ENCOUNTER — Ambulatory Visit: Payer: PPO | Attending: Nurse Practitioner | Admitting: Nurse Practitioner

## 2018-05-28 ENCOUNTER — Other Ambulatory Visit: Payer: Self-pay

## 2018-05-28 VITALS — BP 128/95 | HR 118 | Temp 97.7°F | Ht 68.0 in | Wt 215.0 lb

## 2018-05-28 DIAGNOSIS — G894 Chronic pain syndrome: Secondary | ICD-10-CM | POA: Diagnosis not present

## 2018-05-28 DIAGNOSIS — M47816 Spondylosis without myelopathy or radiculopathy, lumbar region: Secondary | ICD-10-CM | POA: Diagnosis not present

## 2018-05-28 DIAGNOSIS — R1031 Right lower quadrant pain: Secondary | ICD-10-CM | POA: Diagnosis not present

## 2018-05-28 DIAGNOSIS — Z79891 Long term (current) use of opiate analgesic: Secondary | ICD-10-CM | POA: Diagnosis not present

## 2018-05-28 DIAGNOSIS — G8929 Other chronic pain: Secondary | ICD-10-CM | POA: Diagnosis not present

## 2018-05-28 DIAGNOSIS — M25512 Pain in left shoulder: Secondary | ICD-10-CM

## 2018-05-28 MED ORDER — OXYCODONE-ACETAMINOPHEN 10-325 MG PO TABS: 1.0000 | ORAL_TABLET | Freq: Three times a day (TID) | ORAL | 0 refills | Status: DC | PRN

## 2018-05-28 MED ORDER — OXYCODONE-ACETAMINOPHEN 10-325 MG PO TABS: 1.0000 | ORAL_TABLET | Freq: Four times a day (QID) | ORAL | 0 refills | Status: DC | PRN

## 2018-05-28 NOTE — Patient Instructions (Addendum)
____________________________________________________________________________________________  Medication Rules  Purpose: To inform patients, and their family members, of our rules and regulations.  Applies to: All patients receiving prescriptions (written or electronic).  Pharmacy of record: Pharmacy where electronic prescriptions will be sent. If written prescriptions are taken to a different pharmacy, please inform the nursing staff. The pharmacy listed in the electronic medical record should be the one where you would like electronic prescriptions to be sent.  Electronic prescriptions: In compliance with the [place] Strengthen Opioid Misuse Prevention (STOP) Act of 2017 (Session Law 2017-74/H243), effective April 10, 2018, all controlled substances must be electronically prescribed. Calling prescriptions to the pharmacy will cease to exist.  Prescription refills: Only during scheduled appointments. Applies to all prescriptions.  NOTE: The following applies primarily to controlled substances (Opioid* Pain Medications).   Patient's responsibilities: 1. Pain Pills: Bring all pain pills to every appointment (except for procedure appointments). 2. Pill Bottles: Bring pills in original pharmacy bottle. Always bring the newest bottle. Bring bottle, even if empty. 3. Medication refills: You are responsible for knowing and keeping track of what medications you take and those you need refilled. The day before your appointment: write a list of all prescriptions that need to be refilled. The day of the appointment: give the list to the admitting nurse. Prescriptions will be written only during appointments. No prescriptions will be written on procedure days. If you forget a medication: it will not be "Called in", "Faxed", or "electronically sent". You will need to get another appointment to get these prescribed. No early refills. Do not call asking to have your prescription filled  early. 4. Prescription Accuracy: You are responsible for carefully inspecting your prescriptions before leaving our office. Have the discharge nurse carefully go over each prescription with you, before taking them home. Make sure that your name is accurately spelled, that your address is correct. Check the name and dose of your medication to make sure it is accurate. Check the number of pills, and the written instructions to make sure they are clear and accurate. Make sure that you are given enough medication to last until your next medication refill appointment. 5. Taking Medication: Take medication as prescribed. When it comes to controlled substances, taking less pills or less frequently than prescribed is permitted and encouraged. Never take more pills than instructed. Never take medication more frequently than prescribed.  6. Inform other Doctors: Always inform, all of your healthcare providers, of all the medications you take. 7. Pain Medication from other Providers: You are not allowed to accept any additional pain medication from any other Doctor or Healthcare provider. There are two exceptions to this rule. (see below) In the event that you require additional pain medication, you are responsible for notifying us, as stated below. 8. Medication Agreement: You are responsible for carefully reading and following our Medication Agreement. This must be signed before receiving any prescriptions from our practice. Safely store a copy of your signed Agreement. Violations to the Agreement will result in no further prescriptions. (Additional copies of our Medication Agreement are available upon request.) 9. Laws, Rules, & Regulations: All patients are expected to follow all Federal and State Laws, Statutes, Rules, & Regulations. Ignorance of the Laws does not constitute a valid excuse. The use of any illegal substances is prohibited. 10. Adopted CDC guidelines & recommendations: Target dosing levels will be  at or below 60 MME/day. Use of benzodiazepines** is not recommended.  Exceptions: There are only two exceptions to the rule of not   receiving pain medications from other Healthcare Providers. 1. Exception #1 (Emergencies): In the event of an emergency (i.e.: accident requiring emergency care), you are allowed to receive additional pain medication. However, you are responsible for: As soon as you are able, call our office [phone], at any time of the day or night, and leave a message stating your name, the date and nature of the emergency, and the name and dose of the medication prescribed. In the event that your call is answered by a member of our staff, make sure to document and save the date, time, and the name of the person that took your information.  2. Exception #2 (Planned Surgery): In the event that you are scheduled by another doctor or dentist to have any type of surgery or procedure, you are allowed (for a period no longer than 30 days), to receive additional pain medication, for the acute post-op pain. However, in this case, you are responsible for picking up a copy of our "Post-op Pain Management for Surgeons" handout, and giving it to your surgeon or dentist. This document is available at our office, and does not require an appointment to obtain it. Simply go to our office during business hours (Monday-Thursday from 8:00 AM to 4:00 PM) (Friday 8:00 AM to 12:00 Noon) or if you have a scheduled appointment with [place], prior to your surgery, and ask for it by name. In addition, you will need to provide [place] with your name, name of your surgeon, type of surgery, and date of procedure or surgery.  *Opioid medications include: morphine, codeine, oxycodone, oxymorphone, hydrocodone, hydromorphone, meperidine, tramadol, tapentadol, buprenorphine, fentanyl, methadone. **Benzodiazepine medications include: diazepam (Valium), alprazolam (Xanax), clonazepam (Klonopine), lorazepam (Ativan), clorazepate  (Tranxene), chlordiazepoxide (Librium), estazolam (Prosom), oxazepam (Serax), temazepam (Restoril), triazolam (Halcion) (Last updated: 06/07/2017) ____________________________________________________________________________________________   ____________________________________________________________________________________________  Preparing for your procedure (without sedation)  Instructions: . Oral Intake: Do not eat or drink anything for at least 3 hours prior to your procedure. . Transportation: Unless otherwise stated by your physician, you may drive yourself after the procedure. . Blood Pressure Medicine: Take your blood pressure medicine with a sip of water the morning of the procedure. . Blood thinners: Notify our staff if you are taking any blood thinners. Depending on which one you take, there will be specific instructions on how and when to stop it. . Diabetics on insulin: Notify the staff so that you can be scheduled 1st case in the morning. If your diabetes requires high dose insulin, take only  of your normal insulin dose the morning of the procedure and notify the staff that you have done so. . Preventing infections: Shower with an antibacterial soap the morning of your procedure.  . Build-up your immune system: Take 1000 mg of Vitamin C with every meal (3 times a day) the day prior to your procedure. Marland Kitchen Antibiotics: Inform the staff if you have a condition or reason that requires you to take antibiotics before dental procedures. . Pregnancy: If you are pregnant, call and cancel the procedure. . Sickness: If you have a cold, fever, or any active infections, call and cancel the procedure. . Arrival: You must be in the facility at least 30 minutes prior to your scheduled procedure. . Children: Do not bring any children with you. . Dress appropriately: Bring dark clothing that you would not mind if they get stained. . Valuables: Do not bring any jewelry or valuables.  Procedure  appointments are reserved for interventional treatments only. Marland Kitchen No  Prescription Refills. . No medication changes will be discussed during procedure appointments. . No disability issues will be discussed.  Reasons to call and reschedule or cancel your procedure: (Following these recommendations will minimize the risk of a serious complication.) . Surgeries: Avoid having procedures within 2 weeks of any surgery. (Avoid for 2 weeks before or after any surgery). . Flu Shots: Avoid having procedures within 2 weeks of a flu shots or . (Avoid for 2 weeks before or after immunizations). . Barium: Avoid having a procedure within 7-10 days after having had a radiological study involving the use of radiological contrast. (Myelograms, Barium swallow or enema study). . Heart attacks: Avoid any elective procedures or surgeries for the initial 6 months after a "Myocardial Infarction" (Heart Attack). . Blood thinners: It is imperative that you stop these medications before procedures. Let us know if you if you take any blood thinner.  . Infection: Avoid procedures during or within two weeks of an infection (including chest colds or gastrointestinal problems). Symptoms associated with infections include: Localized redness, fever, chills, night sweats or profuse sweating, burning sensation when voiding, cough, congestion, stuffiness, runny nose, sore throat, diarrhea, nausea, vomiting, cold or Flu symptoms, recent or current infections. It is specially important if the infection is over the area that we intend to treat. Marland Kitchen Heart and lung problems: Symptoms that may suggest an active cardiopulmonary problem include: cough, chest pain, breathing difficulties or shortness of breath, dizziness, ankle swelling, uncontrolled high or unusually low blood pressure, and/or palpitations. If you are experiencing any of these symptoms, cancel your procedure and contact your primary care physician for an evaluation.  Remember:   Regular Business hours are:  Monday to Thursday 8:00 AM to 4:00 PM  Provider's Schedule: Milinda Pointer, MD:  Procedure days: Tuesday and Thursday 7:30 AM to 4:00 PM  Gillis Santa, MD:  Procedure days: Monday and Wednesday 7:30 AM to 4:00 PM ____________________________________________________________________________________________

## 2018-05-28 NOTE — Progress Notes (Signed)
Nursing Pain Medication Assessment:  Safety precautions to be maintained throughout the outpatient stay will include: orient to surroundings, keep bed in low position, maintain call bell within reach at all times, provide assistance with transfer out of bed and ambulation.  Medication Inspection Compliance: Gary [name] did not comply with our request to bring his pills to be counted. He was reminded that bringing the medication bottles, even when empty, is a requirement.  Medication: None brought in. Pill/Patch Count: None available to be counted. Bottle Appearance: No container available. Did not bring bottle(s) to appointment. Filled Date: N/A Last Medication intake:  Yesterday

## 2018-05-28 NOTE — Progress Notes (Signed)
Patient's Name: Gary [name]  MRN: [id number]  Referring Provider: Kirk Ruths, MD  DOB: 08/06/1950  PCP: Kirk Ruths, MD  DOS: 05/28/2018  Note by: Dionisio David, NP  Service setting: Ambulatory outpatient  Specialty: Interventional Pain Management  Location: [facility] (AMB) Pain Management Facility    Patient type: Established   HPI  Reason for Visit: Gary [name] is a 68 y.o. year old, male patient, who comes today with a chief complaint of Back Pain (lower) Last Appointment: His last appointment at our practice was on 04/25/2018. I last saw him on 04/17/2018.  Pain Assessment: Today, Gary [name] describes the severity of the Chronic pain as a 8 /10. He indicates the location/referral of the pain to be Back Lower/right hip around to groin. Onset was: More than a month ago. The quality of pain is described as Aching, Constant, Radiating, Stabbing. Temporal description, or timing of pain is: Constant. Possible modifying factors: medication, ice. Gary [name]  height is '5\' 8"'  (1.727 m) and weight is 215 lb (97.5 kg). His temperature is 97.7 F (36.5 C). His blood pressure is 128/95 (abnormal) and his pulse is 118 (abnormal). He admits that he continues to have right sided lower back pain that goes across. The pain also radiates down into his groin.  He was felt like he had 2 days of good relief.  He wants to know what else is going on he does not feel like he should still be having this pain. He is also complaining of left shoulder pain.  He admits that this pain is worse at night he is unable to relate on his left side.  He admits that he has had a diagnosis of bursitis and has received steroid injections which were effective for short time.  He admits that his last shoulder injection was done approximately 4 months ago.  Controlled Substance Pharmacotherapy Assessment REMS (Risk Evaluation and Mitigation Strategy)  Analgesic:Oxycodone10 mg 3 times daily as needed, quantity  90 MME/day:Approximately12m/day. GIgnatius Specking RN  05/28/2018 11:11 AM  Sign when Signing Visit Nursing Pain Medication Assessment:  Safety precautions to be maintained throughout the outpatient stay will include: orient to surroundings, keep bed in low position, maintain call bell within reach at all times, provide assistance with transfer out of bed and ambulation.  Medication Inspection Compliance: Mr. RCoralesdid not comply with our request to bring his pills to be counted. He was reminded that bringing the medication bottles, even when empty, is a requirement.  Medication: None brought in. Pill/Patch Count: None available to be counted. Bottle Appearance: No container available. Did not bring bottle(s) to appointment. Filled Date: N/A Last Medication intake:  Yesterday   Pharmacokinetics: Liberation and absorption (onset of action): WNL Distribution (time to peak effect): WNL Metabolism and excretion (duration of action): WNL         Pharmacodynamics: Desired effects: Analgesia: Mr. RGuedesreports >50% benefit. Functional ability: Patient reports that medication allows him to accomplish basic ADLs Clinically meaningful improvement in function (CMIF): Sustained CMIF goals met Perceived effectiveness: Described as relatively effective, allowing for increase in activities of daily living (ADL) Undesirable effects: Side-effects or Adverse reactions: None reported Monitoring: [place] PMP: Online review of the past 157-montheriod conducted. Compliant with practice rules and regulations Last UDS on record: Summary  Date Value Ref Range Status  02/25/2018 FINAL  Final    Comment:    ==================================================================== TOXASSURE SELECT 13 (MW) ==================================================================== Test  Result       Flag       Units Drug Present not Declared for Prescription Verification   Hydrocodone                     182          UNEXPECTED ng/mg creat   Hydromorphone                  76           UNEXPECTED ng/mg creat   Norhydrocodone                 138          UNEXPECTED ng/mg creat    Sources of hydrocodone include scheduled prescription    medications. Hydromorphone and norhydrocodone are expected    metabolites of hydrocodone. Hydromorphone is also available as a    scheduled prescription medication. ==================================================================== Test                      Result    Flag   Units      Ref Range   Creatinine              84               mg/dL      >=20 ==================================================================== Declared Medications:  The flagging and interpretation on this report are based on the  following declared medications.  Unexpected results may arise from  inaccuracies in the declared medications.  **Note: The testing scope of this panel does not include following  reported medications:  Acetaminophen (Tylenol)  Albuterol  Aspirin (Aspirin 81)  Bupropion (Wellbutrin)  Buspirone (BuSpar)  Clobetasol (Temovate)  Clotrimazole (Lotrisone)  Diclofenac (Voltaren)  Duloxetine (Cymbalta)  Ezetimibe (Zetia)  Fenofibrate (Tricor)  Finasteride (Proscar)  Gabapentin (Neurontin)  Hydrochlorothiazide (HCTZ)  Ibuprofen  Omeprazole (Nexium)  Vitamin D ==================================================================== For clinical consultation, please call [phone]. ====================================================================    UDS interpretation: Unexpected findings: Undeclared controlled substance detected.  Copy of urine drug screen was provided for the patient Medication Assessment Form: Discrepancies found between patient's report and information collected Treatment compliance: Deficiencies noted and steps taken to remind the patient of the seriousness of adequate therapy compliance Risk Assessment  Profile: Aberrant behavior: obtaining controlled substances from inappropriate sources, Wife Comorbid factors increasing risk of overdose: caucasian, male gender and sleep apnea Opioid risk tool (ORT):  Opioid Risk  05/28/2018  Alcohol 0  Illegal Drugs 0  Rx Drugs 0  Alcohol 0  Illegal Drugs 0  Rx Drugs 0  Age between 16-45 years  0  History of Preadolescent Sexual Abuse 0  Psychological Disease 0  Depression 1  Opioid Risk Tool Scoring 1  Opioid Risk Interpretation Low Risk    ORT Scoring interpretation table:  Score <3 = Low Risk for SUD  Score between 4-7 = Moderate Risk for SUD  Score >8 = High Risk for Opioid Abuse   Risk of substance use disorder (SUD): Moderate-to-High  Risk Mitigation Strategies:  Patient Counseling: Covered Patient-Prescriber Agreement (PPA): Present and active  Notification to other healthcare providers: Done  Pharmacologic Plan: Therapy adjustment: Monthly monitoring           Patient became very deffensive when discussed the urine results.  He stated he could leave and go somewhere else and get treatment.  He stated he does not understand why these doctors are acting this way.  He also  stated that he will not be in pain with medication available. Evaluation of last interventional procedure  04/24/2018 Procedure: Right-sided lumbar facet nerve block Pre-procedure pain score:  9/10 Post-procedure pain score: 0/10         Influential Factors: Intra-procedural challenges: None observed.         Reported side-effects: None.        Post-procedural adverse reactions or complications: None reported         Sedation: Please see nurses note for DOS. When no sedatives are used, the analgesic levels obtained are directly associated to the effectiveness of the local anesthetics. However, when sedation is provided, the level of analgesia obtained during the initial 1 hour following the intervention, is believed to be the result of a combination of factors. These  factors may include, but are not limited to: 1. The effectiveness of the local anesthetics used. 2. The effects of the analgesic(s) and/or anxiolytic(s) used. 3. The degree of discomfort experienced by the patient at the time of the procedure. 4. The patients ability and reliability in recalling and recording the events. 5. The presence and influence of possible secondary gains and/or psychosocial factors. Reported result: Relief experienced during the 1st hour after the procedure: 100 % (Ultra-Short Term Relief)            Interpretative annotation: Clinically appropriate result. Analgesia during this period is likely to be Local Anesthetic and/or IV Sedative (Analgesic/Anxiolytic) related.          Effects of local anesthetic: The analgesic effects attained during this period are directly associated to the localized infiltration of local anesthetics and therefore cary significant diagnostic value as to the etiological location, or anatomical origin, of the pain. Expected duration of relief is directly dependent on the pharmacodynamics of the local anesthetic used. Long-acting (4-6 hours) anesthetics used.  Reported result: Relief during the next 4 to 6 hour after the procedure: 50 % (Short-Term Relief)            Interpretative annotation: Clinically appropriate result. Analgesia during this period is likely to be Local Anesthetic-related.          Long-term benefit: Defined as the period of time past the expected duration of local anesthetics (1 hour for short-acting and 4-6 hours for long-acting). With the possible exception of prolonged sympathetic blockade from the local anesthetics, benefits during this period are typically attributed to, or associated with, other factors such as analgesic sensory neuropraxia, antiinflammatory effects, or beneficial biochemical changes provided by agents other than the local anesthetics.  Reported result: Extended relief following procedure: 50 %(2 days)  (Long-Term Relief)            Interpretative annotation: Clinically appropriate result. No long-term benefit.    Pain appears to be refractory to this treatment modality.         ROS  Constitutional: Denies any fever or chills Gastrointestinal: No reported hemesis, hematochezia, vomiting, or acute GI distress Musculoskeletal: Denies any acute onset joint swelling, redness, loss of ROM, or weakness Neurological: No reported episodes of acute onset apraxia, aphasia, dysarthria, agnosia, amnesia, paralysis, loss of coordination, or loss of consciousness  Medication Review  Albuterol Sulfate, DULoxetine, acetaminophen, aspirin, buPROPion, cholecalciferol, clobetasol ointment, clotrimazole-betamethasone, diclofenac sodium, esomeprazole, ezetimibe, fenofibrate, finasteride, gabapentin, hydrochlorothiazide, ibuprofen, meloxicam, methocarbamol, mupirocin ointment, niacin-lovastatin, oxyCODONE-acetaminophen, pravastatin, sildenafil, and tamsulosin  History Review  Allergy: Gary [name] is allergic to niacin-lovastatin er; atorvastatin; simvastatin; and propoxyphene. Drug: Gary [name]  reports no history of drug use. Alcohol:  reports no history of alcohol use. Tobacco:  reports that he quit smoking about 6 years ago. His smoking use included cigarettes. He has a 67.50 pack-year smoking history. He has never used smokeless tobacco. Social: Gary [name]  reports that he quit smoking about 6 years ago. His smoking use included cigarettes. He has a 67.50 pack-year smoking history. He has never used smokeless tobacco. He reports that he does not drink alcohol or use drugs. Medical:  has a past medical history of Anxiety, Benign essential HTN (06/24/2015), BPH (benign prostatic hyperplasia), COPD (chronic obstructive pulmonary disease) ([place]), Coronary artery disease (02/16/2014), DDD (degenerative disc disease), cervical, Degenerative disc disease, cervical (01/22/2017), Depression, DJD (degenerative joint disease),  Emphysema of lung ([place]), Heel pain (10/10/2017), Hyperglobulinemia, Lumbar degenerative disc disease (01/22/2017), Major depression in remission ([place]) (03/10/2014), Migraines, Mitral regurgitation, Mixed hyperlipidemia (02/16/2014), Neck pain (01/24/2017), OSA (obstructive sleep apnea) (07/05/2016), Osteoarthritis of knee (08/12/2014), RA (rheumatoid arthritis) ([place]), Spinal stenosis of lumbar region with neurogenic claudication (04/18/2016), Spondylosis without myelopathy or radiculopathy, lumbar region (01/22/2017), and Venous insufficiency of both lower extremities (11/07/2016). Surgical: Gary [name]  has a past surgical history that includes Cholecystectomy; Knee surgery (Left); deviated septum repair; Cardiac catheterization; Colonoscopy; Nasal septum surgery; Colonoscopy with propofol (N/A, 01/09/2018); and Esophagogastroduodenoscopy (egd) with propofol (N/A, 01/09/2018). Family: family history includes Heart disease in his mother. Problem List: Gary [name] has Chronic bilateral low back pain with left-sided sciatica; Cervical spondylosis with radiculopathy; Chronic sacroiliac joint pain (R>L); and Groin pain, chronic, right on their pertinent problem list.  Lab Review  Kidney Function No results found for: BUN, CREATININE, BCR, GFRAA, GFRNONAALiver Function No results found for: AST, ALT, ALBUMINNote: Above Lab results reviewed.  Imaging Review  Note: Above imaging results reviewed.        Physical Exam  General appearance: Well nourished, well developed, and well hydrated. In no apparent acute distress Mental status: Alert, oriented x 3 (person, place, & time)       Respiratory: No evidence of acute respiratory distress Eyes: PERLA Vitals: BP (!) 128/95   Pulse (!) 118   Temp 97.7 F (36.5 C)   Ht '5\' 8"'  (1.727 m)   Wt 215 lb (97.5 kg)   BMI 32.69 kg/m  BMI: Estimated body mass index is 32.69 kg/m as calculated from the following:   Height as of this encounter: '5\' 8"'  (1.727 m).   Weight as of  this encounter: 215 lb (97.5 kg). Ideal: Ideal body weight: 68.4 kg (150 lb 12.7 oz) Adjusted ideal body weight: 80 kg (176 lb 7.6 oz) Upper Extremity (UE) Exam    Side: Right upper extremity  Side: Left upper extremity  Skin & Extremity Inspection: Skin color, temperature, and hair growth are WNL. No peripheral edema or cyanosis. No masses, redness, swelling, asymmetry, or associated skin lesions. No contractures.  Skin & Extremity Inspection: Skin color, temperature, and hair growth are WNL. No peripheral edema or cyanosis. No masses, redness, swelling, asymmetry, or associated skin lesions. No contractures.  Functional ROM: Unrestricted ROM          Functional ROM: Decreased ROM          Muscle Tone/Strength: Functionally intact. No obvious neuro-muscular anomalies detected.  Muscle Tone/Strength: Normal strength (5/5)  Sensory (Neurological): Unimpaired          Sensory (Neurological): Unimpaired          Palpation: No palpable anomalies              Palpation: No  palpable anomalies              Provocative Test(s):  Phalen's test: deferred Tinel's test: deferred Apley's scratch test (touch opposite shoulder):  Action 1 (Across chest): deferred Action 2 (Overhead): deferred Action 3 (LB reach): deferred   Provocative Test(s):  Phalen's test: deferred Tinel's test: deferred Apley's scratch test (touch opposite shoulder):  Action 1 (Across chest): Decreased ROM Action 2 (Overhead): Decreased ROM Action 3 (LB reach): Decreased ROM    Lumbar Spine Area Exam  Skin & Axial Inspection: No masses, redness, or swelling Alignment: Symmetrical Functional ROM: Unrestricted ROM       Stability: No instability detected Muscle Tone/Strength: Functionally intact. No obvious neuro-muscular anomalies detected. Sensory (Neurological): Unimpaired Palpation: Complains of area being tender to palpation       Provocative Tests: Hyperextension/rotation test: deferred today       Lumbar quadrant test  (Kemp's test): deferred today       Lateral bending test: deferred today       Patrick's Maneuver: deferred today                   Lower Extremity Exam    Side: Right lower extremity  Side: Left lower extremity  Stability: No instability observed          Stability: No instability observed          Skin & Extremity Inspection: Skin color, temperature, and hair growth are WNL. No peripheral edema or cyanosis. No masses, redness, swelling, asymmetry, or associated skin lesions. No contractures.  Skin & Extremity Inspection: Skin color, temperature, and hair growth are WNL. No peripheral edema or cyanosis. No masses, redness, swelling, asymmetry, or associated skin lesions. No contractures.  Functional ROM: Unrestricted ROM                  Functional ROM: Unrestricted ROM                  Muscle Tone/Strength: Functionally intact. No obvious neuro-muscular anomalies detected.  Muscle Tone/Strength: Functionally intact. No obvious neuro-muscular anomalies detected.  Sensory (Neurological): Referred pain pattern      Into the groin area  Sensory (Neurological): Unimpaired            Palpation: No palpable anomalies  Palpation: No palpable anomalies    Assessment   Status Diagnosis  Worsening Worsening Worsening 1. Spondylosis without myelopathy or radiculopathy, lumbar region   2. Groin pain, chronic, right   3. Chronic left shoulder pain   4. Chronic pain syndrome   5. Long term prescription opiate use      Updated Problems: Problem  Chronic Left Shoulder Pain    Plan of Care  Medications: I have discontinued Dellis Filbert C. Kalish's oxyCODONE-acetaminophen and oxyCODONE-acetaminophen. I have also changed his oxyCODONE-acetaminophen. Additionally, I am having him maintain his acetaminophen, Albuterol Sulfate, cholecalciferol, fenofibrate, finasteride, niacin-lovastatin, pravastatin, aspirin, buPROPion, esomeprazole, hydrochlorothiazide, ezetimibe, sildenafil, methocarbamol, meloxicam,  clobetasol ointment, clotrimazole-betamethasone, diclofenac sodium, ibuprofen, mupirocin ointment, DULoxetine, gabapentin, and tamsulosin.  Administered today: Dreyden Rohrman. Pickup had no medications administered during this visit.  Orders:  Orders Placed This Encounter  Procedures  . SHOULDER INJECTION    Standing Status:   Future    Standing Expiration Date:   06/27/2018    Scheduling Instructions:     Side: Left-sided     Sedation: No Sedation.     Timeframe: As soon as schedule allows    Order Specific Question:   Where will this  procedure be performed?    Answer:   [facility]    Comments:   by Dr. Dossie Arbour  . ToxASSURE Select 13 (MW), Urine    Volume: 30 ml(s). Minimum 3 ml of urine is needed. Document temperature of fresh sample. Indications: Long term (current) use of opiate analgesic (P82.518)   Interventional options: Planned follow-up:   Left intra-articular shoulder injection. Plan: Return for w/ Dr. Holley Raring, MedMgmt.  Secondary to patient's inappropriate UDS and defensive behavior during office visit    Note by: Dionisio David, NP Date: 05/28/2018; Time: 11:55 AM

## 2018-05-29 ENCOUNTER — Other Ambulatory Visit: Payer: PPO

## 2018-05-30 ENCOUNTER — Ambulatory Visit: Payer: PPO

## 2018-05-30 DIAGNOSIS — C61 Malignant neoplasm of prostate: Secondary | ICD-10-CM | POA: Diagnosis not present

## 2018-05-30 DIAGNOSIS — Z510 Encounter for antineoplastic radiation therapy: Secondary | ICD-10-CM | POA: Diagnosis not present

## 2018-05-31 ENCOUNTER — Ambulatory Visit
Admission: RE | Admit: 2018-05-31 | Discharge: 2018-05-31 | Disposition: A | Discharge status: Home / Self Care | Payer: PPO | Source: Ambulatory Visit | Attending: Radiation Oncology | Admitting: Radiation Oncology

## 2018-06-01 ENCOUNTER — Encounter: Payer: Self-pay | Admitting: *Deleted

## 2018-06-01 LAB — TOXASSURE SELECT 13 (MW), URINE

## 2018-06-03 ENCOUNTER — Ambulatory Visit
Admission: RE | Admit: 2018-06-03 | Discharge: 2018-06-03 | Disposition: A | Discharge status: Home / Self Care | Payer: PPO | Source: Ambulatory Visit | Attending: Radiation Oncology | Admitting: Radiation Oncology

## 2018-06-03 DIAGNOSIS — Z510 Encounter for antineoplastic radiation therapy: Secondary | ICD-10-CM | POA: Diagnosis not present

## 2018-06-03 DIAGNOSIS — C61 Malignant neoplasm of prostate: Secondary | ICD-10-CM | POA: Diagnosis not present

## 2018-06-04 ENCOUNTER — Ambulatory Visit: Admit: 2018-06-04 | Payer: PPO | Admitting: Urology

## 2018-06-04 ENCOUNTER — Ambulatory Visit
Admission: RE | Admit: 2018-06-04 | Discharge: 2018-06-04 | Disposition: A | Discharge status: Home / Self Care | Payer: PPO | Source: Ambulatory Visit | Attending: Radiation Oncology | Admitting: Radiation Oncology

## 2018-06-04 DIAGNOSIS — Z510 Encounter for antineoplastic radiation therapy: Secondary | ICD-10-CM | POA: Diagnosis not present

## 2018-06-04 DIAGNOSIS — C61 Malignant neoplasm of prostate: Secondary | ICD-10-CM | POA: Diagnosis not present

## 2018-06-04 SURGERY — INSERTION, RADIATION SOURCE, PROSTATE
Anesthesia: Choice

## 2018-06-05 ENCOUNTER — Ambulatory Visit
Admission: RE | Admit: 2018-06-05 | Discharge: 2018-06-05 | Disposition: A | Discharge status: Home / Self Care | Payer: PPO | Source: Ambulatory Visit | Attending: Radiation Oncology | Admitting: Radiation Oncology

## 2018-06-05 DIAGNOSIS — Z510 Encounter for antineoplastic radiation therapy: Secondary | ICD-10-CM | POA: Diagnosis not present

## 2018-06-05 DIAGNOSIS — C61 Malignant neoplasm of prostate: Secondary | ICD-10-CM | POA: Diagnosis not present

## 2018-06-06 ENCOUNTER — Ambulatory Visit
Admission: RE | Admit: 2018-06-06 | Discharge: 2018-06-06 | Disposition: A | Discharge status: Home / Self Care | Payer: PPO | Source: Ambulatory Visit | Attending: Radiation Oncology | Admitting: Radiation Oncology

## 2018-06-06 DIAGNOSIS — C61 Malignant neoplasm of prostate: Secondary | ICD-10-CM | POA: Diagnosis not present

## 2018-06-06 DIAGNOSIS — Z510 Encounter for antineoplastic radiation therapy: Secondary | ICD-10-CM | POA: Diagnosis not present

## 2018-06-07 ENCOUNTER — Ambulatory Visit
Admission: RE | Admit: 2018-06-07 | Discharge: 2018-06-07 | Disposition: A | Discharge status: Home / Self Care | Payer: PPO | Source: Ambulatory Visit | Attending: Radiation Oncology | Admitting: Radiation Oncology

## 2018-06-07 DIAGNOSIS — Z510 Encounter for antineoplastic radiation therapy: Secondary | ICD-10-CM | POA: Diagnosis not present

## 2018-06-07 DIAGNOSIS — C61 Malignant neoplasm of prostate: Secondary | ICD-10-CM | POA: Diagnosis not present

## 2018-06-10 ENCOUNTER — Ambulatory Visit
Admission: RE | Admit: 2018-06-10 | Discharge: 2018-06-10 | Disposition: A | Discharge status: Home / Self Care | Payer: PPO | Source: Ambulatory Visit | Attending: Radiation Oncology | Admitting: Radiation Oncology

## 2018-06-10 DIAGNOSIS — C61 Malignant neoplasm of prostate: Secondary | ICD-10-CM | POA: Diagnosis not present

## 2018-06-10 DIAGNOSIS — Z510 Encounter for antineoplastic radiation therapy: Secondary | ICD-10-CM | POA: Insufficient documentation

## 2018-06-11 ENCOUNTER — Ambulatory Visit
Admission: RE | Admit: 2018-06-11 | Discharge: 2018-06-11 | Disposition: A | Discharge status: Home / Self Care | Payer: PPO | Source: Ambulatory Visit | Attending: Radiation Oncology | Admitting: Radiation Oncology

## 2018-06-11 DIAGNOSIS — Z510 Encounter for antineoplastic radiation therapy: Secondary | ICD-10-CM | POA: Diagnosis not present

## 2018-06-11 DIAGNOSIS — C61 Malignant neoplasm of prostate: Secondary | ICD-10-CM | POA: Diagnosis not present

## 2018-06-12 ENCOUNTER — Ambulatory Visit
Admission: RE | Admit: 2018-06-12 | Discharge: 2018-06-12 | Disposition: A | Discharge status: Home / Self Care | Payer: PPO | Source: Ambulatory Visit | Attending: Radiation Oncology | Admitting: Radiation Oncology

## 2018-06-12 DIAGNOSIS — Z510 Encounter for antineoplastic radiation therapy: Secondary | ICD-10-CM | POA: Diagnosis not present

## 2018-06-12 DIAGNOSIS — C61 Malignant neoplasm of prostate: Secondary | ICD-10-CM | POA: Diagnosis not present

## 2018-06-13 ENCOUNTER — Ambulatory Visit
Admission: RE | Admit: 2018-06-13 | Discharge: 2018-06-13 | Disposition: A | Discharge status: Home / Self Care | Payer: PPO | Source: Ambulatory Visit | Attending: Radiation Oncology | Admitting: Radiation Oncology

## 2018-06-13 DIAGNOSIS — C61 Malignant neoplasm of prostate: Secondary | ICD-10-CM | POA: Diagnosis not present

## 2018-06-13 DIAGNOSIS — Z510 Encounter for antineoplastic radiation therapy: Secondary | ICD-10-CM | POA: Diagnosis not present

## 2018-06-14 ENCOUNTER — Ambulatory Visit
Admission: RE | Admit: 2018-06-14 | Discharge: 2018-06-14 | Disposition: A | Discharge status: Home / Self Care | Payer: PPO | Source: Ambulatory Visit | Attending: Radiation Oncology | Admitting: Radiation Oncology

## 2018-06-14 DIAGNOSIS — M47816 Spondylosis without myelopathy or radiculopathy, lumbar region: Secondary | ICD-10-CM | POA: Diagnosis not present

## 2018-06-14 DIAGNOSIS — M7582 Other shoulder lesions, left shoulder: Secondary | ICD-10-CM | POA: Diagnosis not present

## 2018-06-14 DIAGNOSIS — Z510 Encounter for antineoplastic radiation therapy: Secondary | ICD-10-CM | POA: Diagnosis not present

## 2018-06-14 DIAGNOSIS — M7522 Bicipital tendinitis, left shoulder: Secondary | ICD-10-CM | POA: Diagnosis not present

## 2018-06-14 DIAGNOSIS — M25551 Pain in right hip: Secondary | ICD-10-CM | POA: Diagnosis not present

## 2018-06-14 DIAGNOSIS — C61 Malignant neoplasm of prostate: Secondary | ICD-10-CM | POA: Diagnosis not present

## 2018-06-17 ENCOUNTER — Ambulatory Visit: Payer: PPO

## 2018-06-18 ENCOUNTER — Ambulatory Visit
Admission: RE | Admit: 2018-06-18 | Discharge: 2018-06-18 | Disposition: A | Discharge status: Home / Self Care | Payer: PPO | Source: Ambulatory Visit | Attending: Radiation Oncology | Admitting: Radiation Oncology

## 2018-06-18 DIAGNOSIS — C61 Malignant neoplasm of prostate: Secondary | ICD-10-CM | POA: Diagnosis not present

## 2018-06-18 DIAGNOSIS — Z510 Encounter for antineoplastic radiation therapy: Secondary | ICD-10-CM | POA: Diagnosis not present

## 2018-06-19 ENCOUNTER — Ambulatory Visit
Admission: RE | Admit: 2018-06-19 | Discharge: 2018-06-19 | Disposition: A | Discharge status: Home / Self Care | Payer: PPO | Source: Ambulatory Visit | Attending: Radiation Oncology | Admitting: Radiation Oncology

## 2018-06-19 DIAGNOSIS — Z510 Encounter for antineoplastic radiation therapy: Secondary | ICD-10-CM | POA: Diagnosis not present

## 2018-06-19 DIAGNOSIS — C61 Malignant neoplasm of prostate: Secondary | ICD-10-CM | POA: Diagnosis not present

## 2018-06-20 ENCOUNTER — Ambulatory Visit
Admission: RE | Admit: 2018-06-20 | Discharge: 2018-06-20 | Disposition: A | Discharge status: Home / Self Care | Payer: PPO | Source: Ambulatory Visit | Attending: Radiation Oncology | Admitting: Radiation Oncology

## 2018-06-20 ENCOUNTER — Other Ambulatory Visit: Payer: Self-pay

## 2018-06-20 DIAGNOSIS — C61 Malignant neoplasm of prostate: Secondary | ICD-10-CM | POA: Diagnosis not present

## 2018-06-20 DIAGNOSIS — Z510 Encounter for antineoplastic radiation therapy: Secondary | ICD-10-CM | POA: Diagnosis not present

## 2018-06-21 ENCOUNTER — Ambulatory Visit
Admission: RE | Admit: 2018-06-21 | Discharge: 2018-06-21 | Disposition: A | Discharge status: Home / Self Care | Payer: PPO | Source: Ambulatory Visit | Attending: Radiation Oncology | Admitting: Radiation Oncology

## 2018-06-21 ENCOUNTER — Other Ambulatory Visit: Payer: Self-pay

## 2018-06-21 ENCOUNTER — Ambulatory Visit: Payer: PPO

## 2018-06-21 DIAGNOSIS — Z510 Encounter for antineoplastic radiation therapy: Secondary | ICD-10-CM | POA: Diagnosis not present

## 2018-06-24 ENCOUNTER — Ambulatory Visit
Admission: RE | Admit: 2018-06-24 | Discharge: 2018-06-24 | Disposition: A | Discharge status: Home / Self Care | Payer: PPO | Source: Ambulatory Visit | Attending: Radiation Oncology | Admitting: Radiation Oncology

## 2018-06-24 ENCOUNTER — Other Ambulatory Visit: Payer: Self-pay

## 2018-06-24 DIAGNOSIS — C61 Malignant neoplasm of prostate: Secondary | ICD-10-CM | POA: Diagnosis not present

## 2018-06-24 DIAGNOSIS — Z510 Encounter for antineoplastic radiation therapy: Secondary | ICD-10-CM | POA: Diagnosis not present

## 2018-06-25 ENCOUNTER — Other Ambulatory Visit: Payer: Self-pay

## 2018-06-25 ENCOUNTER — Encounter: Payer: Self-pay | Admitting: Student in an Organized Health Care Education/Training Program

## 2018-06-25 ENCOUNTER — Ambulatory Visit
Payer: PPO | Attending: Student in an Organized Health Care Education/Training Program | Admitting: Student in an Organized Health Care Education/Training Program

## 2018-06-25 VITALS — BP 135/100 | HR 104 | Temp 97.7°F | Resp 18 | Ht 68.0 in | Wt 215.0 lb

## 2018-06-25 DIAGNOSIS — R1031 Right lower quadrant pain: Secondary | ICD-10-CM | POA: Insufficient documentation

## 2018-06-25 DIAGNOSIS — M25512 Pain in left shoulder: Secondary | ICD-10-CM | POA: Diagnosis not present

## 2018-06-25 DIAGNOSIS — G894 Chronic pain syndrome: Secondary | ICD-10-CM | POA: Insufficient documentation

## 2018-06-25 DIAGNOSIS — G8929 Other chronic pain: Secondary | ICD-10-CM | POA: Insufficient documentation

## 2018-06-25 DIAGNOSIS — M47816 Spondylosis without myelopathy or radiculopathy, lumbar region: Secondary | ICD-10-CM | POA: Diagnosis not present

## 2018-06-25 DIAGNOSIS — M533 Sacrococcygeal disorders, not elsewhere classified: Secondary | ICD-10-CM | POA: Insufficient documentation

## 2018-06-25 DIAGNOSIS — M25551 Pain in right hip: Secondary | ICD-10-CM | POA: Diagnosis not present

## 2018-06-25 DIAGNOSIS — Z79891 Long term (current) use of opiate analgesic: Secondary | ICD-10-CM | POA: Insufficient documentation

## 2018-06-25 MED ORDER — OXYCODONE-ACETAMINOPHEN 10-325 MG PO TABS: 1.0000 | ORAL_TABLET | Freq: Three times a day (TID) | ORAL | 0 refills | Status: DC | PRN

## 2018-06-25 MED ORDER — OXYCODONE-ACETAMINOPHEN 10-325 MG PO TABS: 1.0000 | ORAL_TABLET | Freq: Three times a day (TID) | ORAL | 0 refills | Status: AC | PRN

## 2018-06-25 NOTE — Patient Instructions (Signed)
____________________________________________________________________________________________  Preparing for your procedure (without sedation)  Procedure appointments are limited to planned procedures: . No Prescription Refills. . No disability issues will be discussed. . No medication changes will be discussed.  Instructions: . Oral Intake: Do not eat or drink anything for at least 3 hours prior to your procedure. . Transportation: Unless otherwise stated by your physician, you may drive yourself after the procedure. . Blood Pressure Medicine: Take your blood pressure medicine with a sip of water the morning of the procedure. . Blood thinners: Notify our staff if you are taking any blood thinners. Depending on which one you take, there will be specific instructions on how and when to stop it. . Diabetics on insulin: Notify the staff so that you can be scheduled 1st case in the morning. If your diabetes requires high dose insulin, take only  of your normal insulin dose the morning of the procedure and notify the staff that you have done so. . Preventing infections: Shower with an antibacterial soap the morning of your procedure.  . Build-up your immune system: Take 1000 mg of Vitamin C with every meal (3 times a day) the day prior to your procedure. . Antibiotics: Inform the staff if you have a condition or reason that requires you to take antibiotics before dental procedures. . Pregnancy: If you are pregnant, call and cancel the procedure. . Sickness: If you have a cold, fever, or any active infections, call and cancel the procedure. . Arrival: You must be in the facility at least 30 minutes prior to your scheduled procedure. . Children: Do not bring any children with you. . Dress appropriately: Bring dark clothing that you would not mind if they get stained. . Valuables: Do not bring any jewelry or valuables.  Reasons to call and reschedule or cancel your procedure: (Following these  recommendations will minimize the risk of a serious complication.) . Surgeries: Avoid having procedures within 2 weeks of any surgery. (Avoid for 2 weeks before or after any surgery). . Flu Shots: Avoid having procedures within 2 weeks of a flu shots or . (Avoid for 2 weeks before or after immunizations). . Barium: Avoid having a procedure within 7-10 days after having had a radiological study involving the use of radiological contrast. (Myelograms, Barium swallow or enema study). . Heart attacks: Avoid any elective procedures or surgeries for the initial 6 months after a "Myocardial Infarction" (Heart Attack). . Blood thinners: It is imperative that you stop these medications before procedures. Let us know if you if you take any blood thinner.  . Infection: Avoid procedures during or within two weeks of an infection (including chest colds or gastrointestinal problems). Symptoms associated with infections include: Localized redness, fever, chills, night sweats or profuse sweating, burning sensation when voiding, cough, congestion, stuffiness, runny nose, sore throat, diarrhea, nausea, vomiting, cold or Flu symptoms, recent or current infections. It is specially important if the infection is over the area that we intend to treat. . Heart and lung problems: Symptoms that may suggest an active cardiopulmonary problem include: cough, chest pain, breathing difficulties or shortness of breath, dizziness, ankle swelling, uncontrolled high or unusually low blood pressure, and/or palpitations. If you are experiencing any of these symptoms, cancel your procedure and contact your primary care physician for an evaluation.  Remember:  Regular Business hours are:  Monday to Thursday 8:00 AM to 4:00 PM  Provider's Schedule: Francisco Naveira, MD:  Procedure days: Tuesday and Thursday 7:30 AM to 4:00 PM  [place]   Holley Raring, MD:  Procedure days: Monday and Wednesday 7:30 AM to 4:00  PM ____________________________________________________________________________________________ Three prescriptions for Percocet have been sent to your pharmacy.

## 2018-06-25 NOTE — Progress Notes (Signed)
Patient's Name: Gary [name]  MRN: [id number]  Referring Provider: Kirk Ruths, MD  DOB: 17-Mar-1951  PCP: Kirk Ruths, MD  DOS: 06/25/2018  Note by: Gillis Santa, MD  Service setting: Ambulatory outpatient  Specialty: Interventional Pain Management  Location: [facility] (AMB) Pain Management Facility    Patient type: Established   Primary Reason(s) for Visit: Encounter for prescription drug management. (Level of risk: moderate)  CC: Back Pain (low) and Shoulder Pain (left)  HPI  Mr. Dugue is a 68 y.o. year old, male patient, who comes today for a medication management evaluation. He has Spondylosis without myelopathy or radiculopathy, lumbar region; Lumbar degenerative disc disease; Degenerative disc disease, cervical; Chronic pain syndrome; Facet arthropathy, lumbar; Benign essential HTN; Chronic hyperglycemia; Osteoarthritis of knee; Coronary artery disease; Health care maintenance; Heel pain; Localized, primary osteoarthritis; Low back pain; Major depression in remission ([place]); Mixed hyperlipidemia; Neck pain; OSA (obstructive sleep apnea); Pain medication agreement signed; Spinal stenosis of lumbar region with neurogenic claudication; Venous insufficiency of both lower extremities; DDD (degenerative disc disease), cervical; Other intervertebral disc degeneration, lumbar region; Chronic bilateral low back pain with left-sided sciatica; Chronic left sacroiliac joint pain; Chronic hip pain, left; Cervical spondylosis with radiculopathy; B12 deficiency; Prostate cancer ([place]); Chronic sacroiliac joint pain (R>L); Groin pain, chronic, right; Personal history of tobacco use, presenting hazards to health; and Chronic left shoulder pain on their problem list. His primarily concern today is the Back Pain (low) and Shoulder Pain (left)  Pain Assessment: Location: Lower Back(and left shoulder pain) Radiating: right hip around to groin Onset: More than a month ago Duration: Chronic  pain Quality: Aching, Constant, Stabbing, Radiating Severity: 8 /10 (subjective, self-reported pain score)  Note: Reported level is inconsistent with clinical observations.                         When using our objective Pain Scale, levels between 6 and 10/10 are said to belong in an emergency room, as it progressively worsens from a 6/10, described as severely limiting, requiring emergency care not usually available at an outpatient pain management facility. At a 6/10 level, communication becomes difficult and requires great effort. Assistance to reach the emergency department may be required. Facial flushing and profuse sweating along with potentially dangerous increases in heart rate and blood pressure will be evident. Effect on ADL: difficult to lift left leg because that action pulls on right hip and right side of lower back; difficult to get in and out of car Timing: Constant Modifying factors: meds, ice BP: (!) 135/100  HR: (!) 104  Mr. Lienau was last scheduled for an appointment on 04/25/2018 for medication management. During today's appointment we reviewed Mr. Drew chronic pain status, as well as his outpatient medication regimen.  Patient follows up today for medication management.  Is now endorsing right-sided hip pain and groin pain.  Discussed medication management as well as last encounter.  I reinforced our clinic policy of not utilizing medications that are not prescribed to the patient.  Patient endorsed understanding.  Previous UDS appropriate.  We will reduce Percocet to 3 times daily as needed as previously.  The patient  reports no history of drug use. His body mass index is 32.69 kg/m.  Further details on both, my assessment(s), as well as the proposed treatment plan, please see below.  Controlled Substance Pharmacotherapy Assessment REMS (Risk Evaluation and Mitigation Strategy)  Analgesic: Percocet 10 mg TID prn *  MME/day: 41m/day.  Rise Patience, RN   06/25/2018 11:07 AM  Signed Nursing Pain Medication Assessment:  Safety precautions to be maintained throughout the outpatient stay will include: orient to surroundings, keep bed in low position, maintain call bell within reach at all times, provide assistance with transfer out of bed and ambulation.  Medication Inspection Compliance: Pill count conducted under aseptic conditions, in front of the patient. Neither the pills nor the bottle was removed from the patient's sight at any time. Once count was completed pills were immediately returned to the patient in their original bottle.  Medication: Oxycodone/APAP Pill/Patch Count: 4 of 120 pills remain Pill/Patch Appearance: Markings consistent with prescribed medication Bottle Appearance: Standard pharmacy container. Clearly labeled. Filled Date: 2 / 36 / 2020 Last Medication intake:  Today   Pharmacokinetics: Liberation and absorption (onset of action): WNL Distribution (time to peak effect): WNL Metabolism and excretion (duration of action): WNL         Pharmacodynamics: Desired effects: Analgesia: Mr. Aversa reports >50% benefit. Functional ability: Patient reports that medication allows him to accomplish basic ADLs Clinically meaningful improvement in function (CMIF): Sustained CMIF goals met Perceived effectiveness: Described as relatively effective, allowing for increase in activities of daily living (ADL) Undesirable effects: Side-effects or Adverse reactions: None reported Monitoring: [place] PMP: Online review of the past 36-monthperiod conducted. Compliant with practice rules and regulations Last UDS on record: Summary  Date Value Ref Range Status  05/28/2018 FINAL  Final    Comment:    ==================================================================== TOXASSURE SELECT 13 (MW) ==================================================================== Test                             Result       Flag       Units Drug Present    Oxycodone                      413                     ng/mg creat   Oxymorphone                    349                     ng/mg creat   Noroxycodone                   584                     ng/mg creat   Noroxymorphone                 158                     ng/mg creat    Sources of oxycodone are scheduled prescription medications.    Oxymorphone, noroxycodone, and noroxymorphone are expected    metabolites of oxycodone. Oxymorphone is also available as a    scheduled prescription medication. ==================================================================== Test                      Result    Flag   Units      Ref Range   Creatinine              190              mg/dL      >=20 ====================================================================  Declared Medications:  Medication list was not provided. ==================================================================== For clinical consultation, please call [phone]. ====================================================================    UDS interpretation: Compliant          Medication Assessment Form: Reviewed. Patient indicates being compliant with therapy Treatment compliance: Compliant Risk Assessment Profile: Aberrant behavior: See initial evaluations. None observed or detected today Comorbid factors increasing risk of overdose: See initial evaluation. No additional risks detected today Opioid risk tool (ORT):  Opioid Risk  06/25/2018  Alcohol 0  Illegal Drugs 0  Rx Drugs 0  Alcohol 0  Illegal Drugs 0  Rx Drugs 0  Age between 16-45 years  0  History of Preadolescent Sexual Abuse 0  Psychological Disease 0  Depression 1  Opioid Risk Tool Scoring 1  Opioid Risk Interpretation Low Risk    ORT Scoring interpretation table:  Score <3 = Low Risk for SUD  Score between 4-7 = Moderate Risk for SUD  Score >8 = High Risk for Opioid Abuse   Risk of substance use disorder (SUD): Low  Risk Mitigation Strategies:   Patient Counseling: Covered Patient-Prescriber Agreement (PPA): Present and active  Notification to other healthcare providers: Done  Pharmacologic Plan: No change in therapy, at this time.             Laboratory Chemistry   Bone Pathology Markers Lab Results  Component Value Date   TESTOSTERONE <3 (L) 04/29/2018                         Coagulation Parameters Lab Results  Component Value Date   PLT 229 05/08/2018                        Cardiovascular Markers Lab Results  Component Value Date   HGB 14.3 05/08/2018   HCT 42.4 05/08/2018                         CA Markers No results found for: CEA, CA125, LABCA2                      Endocrine Markers Lab Results  Component Value Date   TESTOSTERONE <3 (L) 04/29/2018                        Note: Lab results reviewed.  Recent Diagnostic Imaging Results  CT CHEST LUNG CANCER SCREENING LOW DOSE WO CONTRAST CLINICAL DATA:  68 year old male former smoker (quit 6 years ago) with 33 pack-year history of smoking. Lung cancer screening examination.  EXAM: CT CHEST WITHOUT CONTRAST LOW-DOSE FOR LUNG CANCER SCREENING  TECHNIQUE: Multidetector CT imaging of the chest was performed following the standard protocol without IV contrast.  COMPARISON:  Chest CT 09/01/2008.  FINDINGS: Cardiovascular: Heart size is normal. There is no significant pericardial fluid, thickening or pericardial calcification. Aortic atherosclerosis. No definite coronary artery calcifications.  Mediastinum/Nodes: No pathologically enlarged mediastinal or hilar lymph nodes. Please note that accurate exclusion of hilar adenopathy is limited on noncontrast CT scans. Esophagus is unremarkable in appearance. No axillary lymphadenopathy.  Lungs/Pleura: Tiny pulmonary nodule in the periphery of the right upper lobe (axial image 76 of series 3), with a volume derived mean diameter of only 4.0 mm. No larger more suspicious appearing pulmonary nodules or  masses are noted. No acute consolidative airspace disease. No pleural effusions. Mild diffuse bronchial wall thickening with mild centrilobular and paraseptal emphysema.  Upper Abdomen: Diffuse low attenuation throughout the visualized hepatic parenchyma, indicative of hepatic steatosis. Aortic atherosclerosis.  Musculoskeletal: There are no aggressive appearing lytic or blastic lesions noted in the visualized portions of the skeleton.  IMPRESSION: 1. Lung-RADS 2, benign appearance or behavior. Continue annual screening with low-dose chest CT without contrast in 12 months. 2. Mild diffuse bronchial wall thickening with mild centrilobular and paraseptal emphysema; imaging findings suggestive of underlying COPD. 3. Aortic atherosclerosis. 4. Hepatic steatosis.  Aortic Atherosclerosis (ICD10-I70.0) and Emphysema (ICD10-J43.9).  Electronically Signed   By: Vinnie Langton M.D.   On: 05/22/2018 08:26  Complexity Note: Imaging results reviewed. Results shared with Mr. Altice, using Layman's terms.                               Meds   Current Outpatient Medications:  .  acetaminophen (TYLENOL) 500 MG tablet, Take as needed by mouth. , Disp: , Rfl:  .  Albuterol Sulfate 108 (90 Base) MCG/ACT AEPB, Inhale as needed into the lungs. , Disp: , Rfl:  .  buPROPion (WELLBUTRIN XL) 300 MG 24 hr tablet, Take by mouth., Disp: , Rfl:  .  clobetasol ointment (TEMOVATE) 6.94 %, Apply 1 application topically 2 (two) times daily., Disp: , Rfl:  .  clotrimazole-betamethasone (LOTRISONE) cream, Apply 1 application topically 2 (two) times daily., Disp: , Rfl:  .  diclofenac sodium (VOLTAREN) 1 % GEL, Apply topically 4 (four) times daily., Disp: , Rfl:  .  DULoxetine (CYMBALTA) 60 MG capsule, Take 1 capsule (60 mg total) by mouth daily., Disp: 30 capsule, Rfl: 4 .  esomeprazole (NEXIUM) 40 MG capsule, Take by mouth., Disp: , Rfl:  .  ezetimibe (ZETIA) 10 MG tablet, , Disp: , Rfl:  .  fenofibrate  (TRICOR) 48 MG tablet, Take 48 mg daily by mouth. , Disp: , Rfl:  .  finasteride (PROSCAR) 5 MG tablet, Take 5 mg daily by mouth. , Disp: , Rfl:  .  gabapentin (NEURONTIN) 300 MG capsule, Take 2 capsules (600 mg total) by mouth at bedtime., Disp: 60 capsule, Rfl: 4 .  hydrochlorothiazide (HYDRODIURIL) 25 MG tablet, , Disp: , Rfl:  .  mupirocin ointment (BACTROBAN) 2 %, Place 1 application into the nose 2 (two) times daily., Disp: , Rfl:  .  [START ON 06/26/2018] oxyCODONE-acetaminophen (PERCOCET) 10-325 MG tablet, Take 1 tablet by mouth every 8 (eight) hours as needed for up to 30 days for pain., Disp: 90 tablet, Rfl: 0 .  sildenafil (REVATIO) 20 MG tablet, Take 3 to 5 tablets two hours before intercouse on an empty stomach.  Do not take with nitrates., Disp: 50 tablet, Rfl: 3 .  tamsulosin (FLOMAX) 0.4 MG CAPS capsule, Take 1 capsule (0.4 mg total) by mouth daily after supper., Disp: 30 capsule, Rfl: 6 .  aspirin 81 MG tablet, Take by mouth., Disp: , Rfl:  .  cholecalciferol (VITAMIN D) 1000 units tablet, Take by mouth., Disp: , Rfl:  .  ibuprofen (ADVIL,MOTRIN) 200 MG tablet, Take 200 mg by mouth every 6 (six) hours as needed., Disp: , Rfl:  .  meloxicam (MOBIC) 15 MG tablet, Take 15 mg by mouth daily., Disp: , Rfl:  .  methocarbamol (ROBAXIN) 500 MG tablet, Take 500 mg by mouth 2 (two) times daily as needed for muscle spasms., Disp: , Rfl:  .  niacin-lovastatin (ADVICOR) 1000-20 MG 24 hr tablet, Take by mouth., Disp: , Rfl:  .  [START ON  07/26/2018] oxyCODONE-acetaminophen (PERCOCET) 10-325 MG tablet, Take 1 tablet by mouth every 8 (eight) hours as needed for up to 30 days for pain. Must last 30 days., Disp: 90 tablet, Rfl: 0 .  [START ON 08/25/2018] oxyCODONE-acetaminophen (PERCOCET) 10-325 MG tablet, Take 1 tablet by mouth every 8 (eight) hours as needed for up to 30 days for pain. Must last 30 days., Disp: 90 tablet, Rfl: 0 .  pravastatin (PRAVACHOL) 20 MG tablet, Take by mouth., Disp: , Rfl:    ROS  Constitutional: Denies any fever or chills Gastrointestinal: No reported hemesis, hematochezia, vomiting, or acute GI distress Musculoskeletal: Denies any acute onset joint swelling, redness, loss of ROM, or weakness Neurological: No reported episodes of acute onset apraxia, aphasia, dysarthria, agnosia, amnesia, paralysis, loss of coordination, or loss of consciousness  Allergies  Mr. Allums is allergic to niacin-lovastatin er; atorvastatin; simvastatin; and propoxyphene.  [place]  Drug: Mr. Littles  reports no history of drug use. Alcohol:  reports no history of alcohol use. Tobacco:  reports that he quit smoking about 6 years ago. His smoking use included cigarettes. He has a 67.50 pack-year smoking history. He has never used smokeless tobacco. Medical:  has a past medical history of Anxiety, Benign essential HTN (06/24/2015), BPH (benign prostatic hyperplasia), COPD (chronic obstructive pulmonary disease) ([place]), Coronary artery disease (02/16/2014), DDD (degenerative disc disease), cervical, Degenerative disc disease, cervical (01/22/2017), Depression, DJD (degenerative joint disease), Emphysema of lung ([place]), Heel pain (10/10/2017), Hyperglobulinemia, Lumbar degenerative disc disease (01/22/2017), Major depression in remission ([place]) (03/10/2014), Migraines, Mitral regurgitation, Mixed hyperlipidemia (02/16/2014), Neck pain (01/24/2017), OSA (obstructive sleep apnea) (07/05/2016), Osteoarthritis of knee (08/12/2014), RA (rheumatoid arthritis) ([place]), Spinal stenosis of lumbar region with neurogenic claudication (04/18/2016), Spondylosis without myelopathy or radiculopathy, lumbar region (01/22/2017), and Venous insufficiency of both lower extremities (11/07/2016). Surgical: Mr. Mihelich  has a past surgical history that includes Cholecystectomy; Knee surgery (Left); deviated septum repair; Cardiac catheterization; Colonoscopy; Nasal septum surgery; Colonoscopy with propofol (N/A, 01/09/2018); and  Esophagogastroduodenoscopy (egd) with propofol (N/A, 01/09/2018). Family: family history includes Heart disease in his mother.  Constitutional Exam  General appearance: Well nourished, well developed, and well hydrated. In no apparent acute distress Vitals:   06/25/18 1110  BP: (!) 135/100  Pulse: (!) 104  Resp: 18  Temp: 97.7 F (36.5 C)  TempSrc: Oral  SpO2: 93%  Weight: 215 lb (97.5 kg)  Height: '5\' 8"'  (1.727 m)   BMI Assessment: Estimated body mass index is 32.69 kg/m as calculated from the following:   Height as of this encounter: '5\' 8"'  (1.727 m).   Weight as of this encounter: 215 lb (97.5 kg).  BMI interpretation table: BMI level Category Range association with higher incidence of chronic pain  <18 kg/m2 Underweight   18.5-24.9 kg/m2 Ideal body weight   25-29.9 kg/m2 Overweight Increased incidence by 20%  30-34.9 kg/m2 Obese (Class I) Increased incidence by 68%  35-39.9 kg/m2 Severe obesity (Class II) Increased incidence by 136%  >40 kg/m2 Extreme obesity (Class III) Increased incidence by 254%   Patient's current BMI Ideal Body weight  Body mass index is 32.69 kg/m. Ideal body weight: 68.4 kg (150 lb 12.7 oz) Adjusted ideal body weight: 80 kg (176 lb 7.6 oz)   BMI Readings from Last 4 Encounters:  06/25/18 32.69 kg/m  05/28/18 32.69 kg/m  05/21/18 32.69 kg/m  04/24/18 32.99 kg/m   Wt Readings from Last 4 Encounters:  06/25/18 215 lb (97.5 kg)  05/28/18 215 lb (97.5 kg)  05/21/18 215 lb (  97.5 kg)  04/24/18 217 lb (98.4 kg)  Psych/Mental status: Alert, oriented x 3 (person, place, & time)       Eyes: PERLA Respiratory: No evidence of acute respiratory distress  Cervical Spine Area Exam  Skin & Axial Inspection: No masses, redness, edema, swelling, or associated skin lesions Alignment: Symmetrical Functional ROM: Unrestricted ROM      Stability: No instability detected Muscle Tone/Strength: Functionally intact. No obvious neuro-muscular anomalies  detected. Sensory (Neurological): Unimpaired Palpation: No palpable anomalies              Upper Extremity (UE) Exam    Side: Right upper extremity  Side: Left upper extremity  Skin & Extremity Inspection: Skin color, temperature, and hair growth are WNL. No peripheral edema or cyanosis. No masses, redness, swelling, asymmetry, or associated skin lesions. No contractures.  Skin & Extremity Inspection: Skin color, temperature, and hair growth are WNL. No peripheral edema or cyanosis. No masses, redness, swelling, asymmetry, or associated skin lesions. No contractures.  Functional ROM: Unrestricted ROM          Functional ROM: Unrestricted ROM          Muscle Tone/Strength: Functionally intact. No obvious neuro-muscular anomalies detected.  Muscle Tone/Strength: Functionally intact. No obvious neuro-muscular anomalies detected.  Sensory (Neurological): Unimpaired          Sensory (Neurological): Unimpaired          Palpation: No palpable anomalies              Palpation: No palpable anomalies              Provocative Test(s):  Phalen's test: deferred Tinel's test: deferred Apley's scratch test (touch opposite shoulder):  Action 1 (Across chest): deferred Action 2 (Overhead): deferred Action 3 (LB reach): deferred   Provocative Test(s):  Phalen's test: deferred Tinel's test: deferred Apley's scratch test (touch opposite shoulder):  Action 1 (Across chest): deferred Action 2 (Overhead): deferred Action 3 (LB reach): deferred    Thoracic Spine Area Exam  Skin & Axial Inspection: No masses, redness, or swelling Alignment: Symmetrical Functional ROM: Unrestricted ROM Stability: No instability detected Muscle Tone/Strength: Functionally intact. No obvious neuro-muscular anomalies detected. Sensory (Neurological): Unimpaired Muscle strength & Tone: No palpable anomalies  Lumbar Spine Area Exam  Skin & Axial Inspection: No masses, redness, or swelling Alignment: Symmetrical Functional  ROM: Decreased ROM       Stability: No instability detected Muscle Tone/Strength: Functionally intact. No obvious neuro-muscular anomalies detected. Sensory (Neurological): Dermatomal pain pattern and musculoskeletal Palpation: No palpable anomalies       Provocative Tests: Hyperextension/rotation test: (+) bilaterally for facet joint pain. Lumbar quadrant test (Kemp's test): (+) due to pain. Lateral bending test: deferred today       Patrick's Maneuver: (+) for right-sided S-I arthralgia             FABER* test: (+) for right-sided S-I arthralgia             S-I anterior distraction/compression test: deferred today         S-I lateral compression test: deferred today         S-I Thigh-thrust test: deferred today         S-I Gaenslen's test: deferred today         *(Flexion, ABduction and External Rotation)  Gait & Posture Assessment  Ambulation: Unassisted Gait: Relatively normal for age and body habitus Posture: WNL   Lower Extremity Exam    Side: Right lower extremity  Side: Left lower extremity  Stability: No instability observed          Stability: No instability observed          Skin & Extremity Inspection: Skin color, temperature, and hair growth are WNL. No peripheral edema or cyanosis. No masses, redness, swelling, asymmetry, or associated skin lesions. No contractures.  Skin & Extremity Inspection: Skin color, temperature, and hair growth are WNL. No peripheral edema or cyanosis. No masses, redness, swelling, asymmetry, or associated skin lesions. No contractures.  Functional ROM: Unrestricted ROM                  Functional ROM: Unrestricted ROM                  Muscle Tone/Strength: Functionally intact. No obvious neuro-muscular anomalies detected.  Muscle Tone/Strength: Functionally intact. No obvious neuro-muscular anomalies detected.  Sensory (Neurological): Unimpaired        Sensory (Neurological): Unimpaired        DTR: Patellar: deferred today Achilles: deferred  today Plantar: deferred today  DTR: Patellar: deferred today Achilles: deferred today Plantar: deferred today  Palpation: No palpable anomalies  Palpation: No palpable anomalies   Assessment   Status Diagnosis  Having a Flare-up Having a Flare-up Having a Flare-up 1. Chronic right SI joint pain   2. SI (sacroiliac) joint dysfunction   3. Right hip pain   4. Groin pain, chronic, right   5. Chronic pain syndrome   6. Spondylosis without myelopathy or radiculopathy, lumbar region   7. Chronic left shoulder pain   8. Long term prescription opiate use     General Recommendations: The pain condition that the patient suffers from is best treated with a multidisciplinary approach that involves an increase in physical activity to prevent de-conditioning and worsening of the pain cycle, as well as psychological counseling (formal and/or informal) to address the co-morbid psychological affects of pain. Treatment will often involve judicious use of pain medications and interventional procedures to decrease the pain, allowing the patient to participate in the physical activity that will ultimately produce long-lasting pain reductions. The goal of the multidisciplinary approach is to return the patient to a higher level of overall function and to restore their ability to perform activities of daily living.  Right SI joint arthropathy/dysfunction: Discussed diagnostic right sacroiliac joint steroid injection under fluoroscopy.  On physical exam, patient does have positive Patrick's and Faber's test.  Is also endorsing right groin pain that could be associated with SI joint dysfunction.  Risks and benefits of this procedure were reviewed and patient would like to proceed.  Also reinforced our clinic policy when it comes to chronic pain management.  Informed the patient that can only take medications that are prescribed to him.  Any future violations of this would be grounds to discontinue chronic opioid  therapy.  Patient endorsed understanding.   Plan of Care  Pharmacotherapy (Medications Ordered): Meds ordered this encounter  Medications  . oxyCODONE-acetaminophen (PERCOCET) 10-325 MG tablet    Sig: Take 1 tablet by mouth every 8 (eight) hours as needed for up to 30 days for pain.    Dispense:  90 tablet    Refill:  0  . oxyCODONE-acetaminophen (PERCOCET) 10-325 MG tablet    Sig: Take 1 tablet by mouth every 8 (eight) hours as needed for up to 30 days for pain. Must last 30 days.    Dispense:  90 tablet    Refill:  0    [place]  STOP ACT - Not applicable. Fill one day early if pharmacy is closed on scheduled refill date.  [place] oxyCODONE-acetaminophen (PERCOCET) 10-325 MG tablet    Sig: Take 1 tablet by mouth every 8 (eight) hours as needed for up to 30 days for pain. Must last 30 days.    Dispense:  90 tablet    Refill:  0    [place] STOP ACT - Not applicable. Fill one day early if pharmacy is closed on scheduled refill date.   Lab-work, procedure(s), and/or referral(s): Orders Placed This Encounter  Procedures  . SACROILIAC JOINT INJECTION   Time Note: Greater than 50% of the 25 minute(s) of face-to-face time spent with Mr. Yepiz, was spent in counseling/coordination of care regarding: Mr. Doucet primary cause of pain, the treatment plan, treatment alternatives, the risks and possible complications of proposed treatment, going over the informed consent, the opioid analgesic risks and possible complications, the appropriate use of his medications, realistic expectations, the goals of pain management (increased in functionality), the medication agreement and the patient's responsibilities when it comes to controlled substances.  Provider-requested follow-up: Return for Procedure.  Future Appointments  Date Time Provider [place]  07/08/2018 11:30 AM Gillis Santa, MD [facility] None  07/22/2018 11:00 AM Noreene Filbert, MD [facility] None  09/17/2018 11:30 AM Gillis Santa, MD  [facility] None  11/01/2018 10:00 AM Shary Decamp None    Primary Care Physician: Kirk Ruths, MD Location: [facility] Note by: Gillis Santa, M.D Date: 06/25/2018; Time: 1:47 PM  Patient Instructions  ____________________________________________________________________________________________  Preparing for your procedure (without sedation)  Procedure appointments are limited to planned procedures: . No Prescription Refills. . No disability issues will be discussed. . No medication changes will be discussed.  Instructions: . Oral Intake: Do not eat or drink anything for at least 3 hours prior to your procedure. . Transportation: Unless otherwise stated by your physician, you may drive yourself after the procedure. . Blood Pressure Medicine: Take your blood pressure medicine with a sip of water the morning of the procedure. . Blood thinners: Notify our staff if you are taking any blood thinners. Depending on which one you take, there will be specific instructions on how and when to stop it. . Diabetics on insulin: Notify the staff so that you can be scheduled 1st case in the morning. If your diabetes requires high dose insulin, take only  of your normal insulin dose the morning of the procedure and notify the staff that you have done so. . Preventing infections: Shower with an antibacterial soap the morning of your procedure.  . Build-up your immune system: Take 1000 mg of Vitamin C with every meal (3 times a day) the day prior to your procedure. Marland Kitchen Antibiotics: Inform the staff if you have a condition or reason that requires you to take antibiotics before dental procedures. . Pregnancy: If you are pregnant, call and cancel the procedure. . Sickness: If you have a cold, fever, or any active infections, call and cancel the procedure. . Arrival: You must be in the facility at least 30 minutes prior to your scheduled procedure. . Children: Do not  bring any children with you. . Dress appropriately: Bring dark clothing that you would not mind if they get stained. . Valuables: Do not bring any jewelry or valuables.  Reasons to call and reschedule or cancel your procedure: (Following these recommendations will minimize the risk of a serious complication.) . Surgeries: Avoid having procedures within 2 weeks of  any surgery. (Avoid for 2 weeks before or after any surgery). . Flu Shots: Avoid having procedures within 2 weeks of a flu shots or . (Avoid for 2 weeks before or after immunizations). . Barium: Avoid having a procedure within 7-10 days after having had a radiological study involving the use of radiological contrast. (Myelograms, Barium swallow or enema study). . Heart attacks: Avoid any elective procedures or surgeries for the initial 6 months after a "Myocardial Infarction" (Heart Attack). . Blood thinners: It is imperative that you stop these medications before procedures. Let us know if you if you take any blood thinner.  . Infection: Avoid procedures during or within two weeks of an infection (including chest colds or gastrointestinal problems). Symptoms associated with infections include: Localized redness, fever, chills, night sweats or profuse sweating, burning sensation when voiding, cough, congestion, stuffiness, runny nose, sore throat, diarrhea, nausea, vomiting, cold or Flu symptoms, recent or current infections. It is specially important if the infection is over the area that we intend to treat. Marland Kitchen Heart and lung problems: Symptoms that may suggest an active cardiopulmonary problem include: cough, chest pain, breathing difficulties or shortness of breath, dizziness, ankle swelling, uncontrolled high or unusually low blood pressure, and/or palpitations. If you are experiencing any of these symptoms, cancel your procedure and contact your primary care physician for an evaluation.  Remember:  Regular Business hours are:  Monday to  Thursday 8:00 AM to 4:00 PM  Provider's Schedule: Milinda Pointer, MD:  Procedure days: Tuesday and Thursday 7:30 AM to 4:00 PM  Gillis Santa, MD:  Procedure days: Monday and Wednesday 7:30 AM to 4:00 PM ____________________________________________________________________________________________ Three prescriptions for Percocet have been sent to your pharmacy.

## 2018-06-25 NOTE — Progress Notes (Signed)
Nursing Pain Medication Assessment:  Safety precautions to be maintained throughout the outpatient stay will include: orient to surroundings, keep bed in low position, maintain call bell within reach at all times, provide assistance with transfer out of bed and ambulation.  Medication Inspection Compliance: Pill count conducted under aseptic conditions, in front of the patient. Neither the pills nor the bottle was removed from the patient's sight at any time. Once count was completed pills were immediately returned to the patient in their original bottle.  Medication: Oxycodone/APAP Pill/Patch Count: 4 of 120 pills remain Pill/Patch Appearance: Markings consistent with prescribed medication Bottle Appearance: Standard pharmacy container. Clearly labeled. Filled Date: 2 / 22 / 2020 Last Medication intake:  Today

## 2018-07-01 ENCOUNTER — Ambulatory Visit (INDEPENDENT_AMBULATORY_CARE_PROVIDER_SITE_OTHER): Payer: PPO | Admitting: Urology

## 2018-07-01 ENCOUNTER — Telehealth: Payer: Self-pay | Admitting: Urology

## 2018-07-01 ENCOUNTER — Other Ambulatory Visit: Payer: Self-pay

## 2018-07-01 ENCOUNTER — Encounter: Payer: Self-pay | Admitting: Urology

## 2018-07-01 ENCOUNTER — Ambulatory Visit: Payer: Non-veteran care | Admitting: Student in an Organized Health Care Education/Training Program

## 2018-07-01 VITALS — BP 151/87 | HR 103 | Ht 68.0 in

## 2018-07-01 DIAGNOSIS — R3911 Hesitancy of micturition: Secondary | ICD-10-CM

## 2018-07-01 DIAGNOSIS — C61 Malignant neoplasm of prostate: Secondary | ICD-10-CM | POA: Diagnosis not present

## 2018-07-01 DIAGNOSIS — R300 Dysuria: Secondary | ICD-10-CM | POA: Diagnosis not present

## 2018-07-01 LAB — URINALYSIS, COMPLETE
Bilirubin, UA: NEGATIVE
Glucose, UA: NEGATIVE
Ketones, UA: NEGATIVE
LEUKOCYTES UA: NEGATIVE
Nitrite, UA: NEGATIVE
Protein, UA: NEGATIVE
Specific Gravity, UA: 1.025 (ref 1.005–1.030)
Urobilinogen, Ur: 1 mg/dL (ref 0.2–1.0)
pH, UA: 5.5 (ref 5.0–7.5)

## 2018-07-01 LAB — MICROSCOPIC EXAMINATION
Bacteria, UA: NONE SEEN
Epithelial Cells (non renal): NONE SEEN /hpf (ref 0–10)
WBC, UA: NONE SEEN /hpf (ref 0–5)

## 2018-07-01 LAB — BLADDER SCAN AMB NON-IMAGING

## 2018-07-01 NOTE — Telephone Encounter (Signed)
Patient coming in for an UA and PVR today.

## 2018-07-01 NOTE — Progress Notes (Signed)
03/27/2018 1:57 PM   Gary [name] 02-Jan-1951 [id number]  Referring provider: Kirk Ruths, MD [facility] [place], [place] [address]  Chief Complaint  Patient presents with   Dysuria   HPI: Patient is a 68 year old Caucasian male with prostate cancer, BPH with LU TS who presents today with the complaints of with difficulty urinating.    Prostate Cancer  Prostate MRI from 12/09/2017 was remarkable for a 11 g prostate. There was a PI-RADS 3 lesion in the left mid gland rating 6 mm. There was no evidence of pelvic adenopathy or extracapsular/seminal vesicle involvement.   He had undergone MR fusion biopsy in [place] on 10/17 /2019 where the prostate volume was calculated to be 19 g. Standard 12 core biopsies were performed in addition to 3 biopsies on the region of interest.  Pathology: 12/15 cores positive for adenocarcinoma the prostate.  All left-sided cores including the ROI biopsies were positive for Gleason 4+3/3+3 adenocarcinoma.  One core showed Gleason 3+3 adenocarcinoma.  Refer to the scanned pathology report for details.  His bone scan from 02/19/2018 showed degenerative changes. There were areas of increased uptake in the L3 vertebral body and ribs. MR of lumbar spine from 03/20/2018 showed no evidence of metastatic disease.   He received 6 months Lupron on 05/03/2018.    He has completed radiation on 06/24/2018.    BPH with LU TS Currently taking tamsulosin 0.4 mg daily.  He states that one week ago he started to experience difficulty with urination which he describes as pain prior to urination, frequency, incontinence, straining to urinate, weak urinary stream, hesitancy and nocturia x 3-4.   Patient denies any gross hematuria, dysuria or suprapubic/flank pain.  Patient denies any fevers, chills, nausea or vomiting.   His UA is negative.  His PVR is 121 mL.     PMH: Past Medical History:  Diagnosis Date   Anxiety     Benign essential HTN 06/24/2015   Last Assessment & Plan:  Taking medications without noted side effects or dizziness.   Overview:  Last Assessment & Plan:  Is compliant with hypertensive medications without clear side effects or lack of control.     BPH (benign prostatic hyperplasia)    COPD (chronic obstructive pulmonary disease) (HCC)    Coronary artery disease 02/16/2014   Overview:  Minimal disease by cath 12/13  Last Assessment & Plan:  Seems to be tolerating medical regimen without significant side effects and symptoms such as worsening chest pain are not noted.   Overview:  Overview:  Minimal disease by cath 12/13  Last Assessment & Plan:  Seems to be tolerating medical regimen without significant side effects and symptoms such as worsening chest pain are not no   DDD (degenerative disc disease), cervical    Degenerative disc disease, cervical 01/22/2017   Depression    DJD (degenerative joint disease)    Emphysema of lung ([place])    Heel pain 10/10/2017   Hyperglobulinemia    Lumbar degenerative disc disease 01/22/2017   Major depression in remission ([place]) 03/10/2014   Last Assessment & Plan:  Mood is doing well on meds Overview:  Last Assessment & Plan:  Mood is doing well on meds    Migraines    Mitral regurgitation    Mixed hyperlipidemia 02/16/2014   Last Assessment & Plan:  Diet for healthy cholesterol is being attempted and no clear myalgia's or other side effects are noted.   Overview:  Last Assessment & Plan:  Low fat diet is being attempted and no significant side effects such as myalgia's are noted.     Neck pain 01/24/2017   OSA (obstructive sleep apnea) 07/05/2016   Last Assessment & Plan:  Continues to use cpap consistently and is continuing to benefit from it's use.     Osteoarthritis of knee 08/12/2014   Overview:  Right knee is worse now  Last Assessment & Plan:  Diffuse pain on back in hips and knees also. Went to a pain clinic  Overview:  Overview:  Right  knee is worse now  Last Assessment & Plan:  Diffuse pain on back in hips and knees also. Went to a pain clinic    RA (rheumatoid arthritis) ([place])    Spinal stenosis of lumbar region with neurogenic claudication 04/18/2016   Spondylosis without myelopathy or radiculopathy, lumbar region 01/22/2017   Venous insufficiency of both lower extremities 11/07/2016    Surgical History: Past Surgical History:  Procedure Laterality Date   CARDIAC CATHETERIZATION     CHOLECYSTECTOMY     COLONOSCOPY     COLONOSCOPY WITH PROPOFOL N/A 01/09/2018   Procedure: COLONOSCOPY WITH PROPOFOL;  Surgeon: Toledo, Benay Pike, MD;  Location: [facility] END[facility]COPY;  Service: Gastroenterology;  Laterality: N/A;   deviated septum repair     ESOPHAGOGASTRODUODENOSCOPY (EGD) WITH PROPOFOL N/A 01/09/2018   Procedure: ESOPHAGOGASTRODUODENOSCOPY (EGD) WITH PROPOFOL;  Surgeon: Toledo, Benay Pike, MD;  Location: [facility];  Service: Gastroenterology;  Laterality: N/A;   KNEE SURGERY Left    NASAL SEPTUM SURGERY      Home Medications:  Allergies as of 07/01/2018      Reactions   Niacin-lovastatin Er Other (See Comments)   unknown unknown   Atorvastatin Other (See Comments)   Simvastatin Other (See Comments)   Propoxyphene Nausea Only   Passed out after taking it on an empty stomach      Medication List       Accurate as of July 01, 2018  1:57 PM. Always use your most recent med list.        acetaminophen 500 MG tablet Commonly known as:  TYLENOL Take as needed by mouth.   Albuterol Sulfate 108 (90 Base) MCG/ACT Aepb Inhale as needed into the lungs.   aspirin 81 MG tablet Take by mouth.   buPROPion 300 MG 24 hr tablet Commonly known as:  WELLBUTRIN XL Take by mouth.   cholecalciferol 25 MCG (1000 UT) tablet Commonly known as:  VITAMIN D Take by mouth.   clobetasol ointment 0.05 % Commonly known as:  TEMOVATE Apply 1 application topically 2 (two) times daily.   clotrimazole-betamethasone  cream Commonly known as:  LOTRISONE Apply 1 application topically 2 (two) times daily.   diclofenac sodium 1 % Gel Commonly known as:  VOLTAREN Apply topically 4 (four) times daily.   DULoxetine 60 MG capsule Commonly known as:  CYMBALTA Take 1 capsule (60 mg total) by mouth daily.   esomeprazole 40 MG capsule Commonly known as:  NEXIUM Take by mouth.   ezetimibe 10 MG tablet Commonly known as:  ZETIA   fenofibrate 48 MG tablet Commonly known as:  TRICOR Take 48 mg daily by mouth.   finasteride 5 MG tablet Commonly known as:  PROSCAR Take 5 mg daily by mouth.   gabapentin 300 MG capsule Commonly known as:  NEURONTIN Take 2 capsules (600 mg total) by mouth at bedtime.   hydrochlorothiazide 25 MG tablet Commonly known as:  HYDRODIURIL  ibuprofen 200 MG tablet Commonly known as:  ADVIL,MOTRIN Take 200 mg by mouth every 6 (six) hours as needed.   meloxicam 15 MG tablet Commonly known as:  MOBIC Take 15 mg by mouth daily.   methocarbamol 500 MG tablet Commonly known as:  ROBAXIN Take 500 mg by mouth 2 (two) times daily as needed for muscle spasms.   mupirocin ointment 2 % Commonly known as:  BACTROBAN Place 1 application into the nose 2 (two) times daily.   niacin-lovastatin 1000-20 MG 24 hr tablet Commonly known as:  ADVICOR Take by mouth.   oxyCODONE-acetaminophen 10-325 MG tablet Commonly known as:  Percocet Take 1 tablet by mouth every 8 (eight) hours as needed for up to 30 days for pain.   oxyCODONE-acetaminophen 10-325 MG tablet Commonly known as:  Percocet Take 1 tablet by mouth every 8 (eight) hours as needed for up to 30 days for pain. Must last 30 days. Start taking on:  July 26, 2018   oxyCODONE-acetaminophen 10-325 MG tablet Commonly known as:  Percocet Take 1 tablet by mouth every 8 (eight) hours as needed for up to 30 days for pain. Must last 30 days. Start taking on:  Aug 25, 2018   pravastatin 20 MG tablet Commonly known as:   PRAVACHOL Take by mouth.   sildenafil 20 MG tablet Commonly known as:  REVATIO Take 3 to 5 tablets two hours before intercouse on an empty stomach.  Do not take with nitrates.   tamsulosin 0.4 MG Caps capsule Commonly known as:  FLOMAX Take 1 capsule (0.4 mg total) by mouth daily after supper.       Allergies:  Allergies  Allergen Reactions   Niacin-Lovastatin Er Other (See Comments)    unknown unknown   Atorvastatin Other (See Comments)   Simvastatin Other (See Comments)   Propoxyphene Nausea Only    Passed out after taking it on an empty stomach    Family History: Family History  Problem Relation Age of Onset   Heart disease Mother     Social History:  reports that he quit smoking about 6 years ago. His smoking use included cigarettes. He has a 67.50 pack-year smoking history. He has never used smokeless tobacco. He reports that he does not drink alcohol or use drugs.  ROS: UROLOGY Frequent Urination?: Yes Hard to postpone urination?: No Burning/pain with urination?: Yes Get up at night to urinate?: No Leakage of urine?: Yes Urine stream starts and stops?: No Trouble starting stream?: No Do you have to strain to urinate?: Yes Blood in urine?: No Urinary tract infection?: No Sexually transmitted disease?: No Injury to kidneys or bladder?: No Painful intercourse?: No Weak stream?: Yes Erection problems?: No Penile pain?: No  Gastrointestinal Nausea?: No Vomiting?: No Indigestion/heartburn?: No Diarrhea?: No Constipation?: No  Constitutional Fever: No Night sweats?: No Weight loss?: No Fatigue?: No  Skin Skin rash/lesions?: No Itching?: No  Eyes Blurred vision?: No Double vision?: No  Ears/Nose/Throat Sore throat?: No Sinus problems?: No  Hematologic/Lymphatic Swollen glands?: No Easy bruising?: No  Cardiovascular Leg swelling?: No Chest pain?: No  Respiratory Cough?: No Shortness of breath?: No  Endocrine Excessive  thirst?: No  Musculoskeletal Back pain?: No Joint pain?: No  Neurological Headaches?: No Dizziness?: No  Psychologic Depression?: No Anxiety?: No  Physical Exam: BP (!) 151/87    Pulse (!) 103    Ht 5\' 8"  (1.727 m)    BMI 32.69 kg/m   Constitutional:  Well nourished. Alert and oriented, No acute  distress. HEENT: [place] AT, moist mucus membranes.  Trachea midline, no masses. Cardiovascular: No clubbing, cyanosis, or edema. Respiratory: Normal respiratory effort, no increased work of breathing. GI: Abdomen is soft, non tender, non distended, no abdominal masses. Liver and spleen not palpable.  No hernias appreciated.  Stool sample for occult testing is not indicated.   GU: No CVA tenderness.  No bladder fullness or masses.  Patient with circumcised phallus.  Urethral meatus is patent.  No penile discharge. No penile lesions or rashes.  Rectal: Deferred.  Skin: No rashes, bruises or suspicious lesions. Lymph: No cervical or inguinal adenopathy. Neurologic: Grossly intact, no focal deficits, moving all 4 extremities. Psychiatric: Normal mood and affect.  Laboratory Data Urinalysis UA is negative.  See Epic.  I have reviewed the labs.  Pertinent Imaging: Results for Gary [name], Gary [name] (MRN [id number]) as of 07/01/2018 13:22  Ref. Range 07/01/2018 13:17  Scan Result Unknown 175ml   Continuous Intermittent Catheterization Due to urinary hesitancy and to reduce the need to seek treatment in the ED in an effort to reduce risk of exposure of the COVID-19 virus patient is present today for a teaching of self I & O Catheterization. Patient was given detailed verbal and printed instructions of self catheterization. Patient was cleaned and prepped in a sterile fashion.  With instruction and assistance patient inserted a 14 FR and urine return was noted 130 ml, urine was yellow in color. Patient tolerated well, no complications were noted Patient was given a sample bag with supplies to take home.   Instructions were given per me for patient to cath if unable to void on his own.   Patient is to contact the office by phone in 3 weeks.    Performed by: Zara Council, PA-C    Assessment & Plan:    1. Urinary hesitancy  Patient to increase his tamsulosin 0.4 mg to 0.8 mg daily Instructed patient on self cath techniques as PVR in the office does not warrant placing an indwelling Foley but patient is report symptoms that describe a pending episode of urinary retention- he demonstrated a successful self cath in office and obtain a PVR of 130 mL of yellow urine Patient contact office in three weeks regarding the effects of increasing his tamsulosin to 0.8 mg He is to contact the office sooner if symptoms worsen or he needs to CIC   2. Prostate Cancer (high volume T2b intermediate risk (unfavorable) adenocarcinoma of the prostate Appointments with Dr. Donella Stade 07/22/2018 and on 11/01/2018 with the nurse for Lupron  Return for keep follow up in april and july .  These notes generated with voice recognition software. I apologize for typographical errors.  Zara Council, PA-C  [place] [address]  [place],  [id number] [phone]  I spent 25 minutes with this patient in a face to face conversation of which greater than 50% was spent in counseling and coordination of care with the patient and instruction in CIC.

## 2018-07-01 NOTE — Telephone Encounter (Signed)
Pt called and wants Larene Beach to call him, He states that he is able to urinate but is having difficulty urinating.

## 2018-07-02 ENCOUNTER — Ambulatory Visit: Payer: PPO | Admitting: Radiation Oncology

## 2018-07-02 ENCOUNTER — Ambulatory Visit: Payer: PPO

## 2018-07-08 ENCOUNTER — Ambulatory Visit: Payer: Non-veteran care | Admitting: Student in an Organized Health Care Education/Training Program

## 2018-07-09 ENCOUNTER — Ambulatory Visit: Payer: PPO | Admitting: Radiation Oncology

## 2018-07-09 ENCOUNTER — Ambulatory Visit: Payer: PPO

## 2018-07-15 ENCOUNTER — Telehealth: Payer: Self-pay | Admitting: Urology

## 2018-07-15 ENCOUNTER — Other Ambulatory Visit: Payer: Self-pay | Admitting: Urology

## 2018-07-15 MED ORDER — SULFAMETHOXAZOLE-TRIMETHOPRIM 800-160 MG PO TABS: 1.0000 | ORAL_TABLET | Freq: Two times a day (BID) | ORAL | 0 refills | Status: DC

## 2018-07-15 NOTE — Telephone Encounter (Signed)
Will you review this pt's chart and advise, his hx urinary hesitancy concerns me. Thanks

## 2018-07-15 NOTE — Telephone Encounter (Signed)
Has he been cathing himself?  Has he increased his tamsulosin to 0.8 mg daily (two capsules)?  If so, how much is he getting out?  If not, I would like him to start cathing and see if his symptoms resolve.

## 2018-07-15 NOTE — Telephone Encounter (Signed)
Pt called and states that he possibly has UTI, He states it is hard to start his stream when he urinates and sometimes it is painful. Please advise.

## 2018-07-15 NOTE — Progress Notes (Signed)
Gary [name] states he is having cloudy and malordous urine associated with dysuria and hesitancy.  I have sent in Septra DD to [place].  He has not cathed himself.

## 2018-07-18 ENCOUNTER — Other Ambulatory Visit: Payer: Self-pay | Admitting: *Deleted

## 2018-07-18 MED ORDER — MIRABEGRON ER 25 MG PO TB24: 25.0000 mg | ORAL_TABLET | Freq: Every day | ORAL | 5 refills | Status: DC

## 2018-07-22 ENCOUNTER — Telehealth: Payer: Self-pay | Admitting: Urology

## 2018-07-22 ENCOUNTER — Ambulatory Visit: Payer: PPO | Admitting: Radiation Oncology

## 2018-07-22 NOTE — Telephone Encounter (Signed)
Gary [name] did cath himself this afternoon and got a fair amount of urine out.  He states he feels that he met resistance when passing the cath.  He denies any blood in the urine.  I advised the patient that if his symptoms still persists, he needs to come to the office for an UA and culture.

## 2018-07-22 NOTE — Telephone Encounter (Signed)
Patient called and states that he called in to the on call nurse last night and spoke with Dr. Alyson Ingles because he was having trouble urinating. He said he was told to self cath and he said he did not want to do that because it gave him an infection which he states he already has? He said he is having trouble urinating now and wants to know if he can have more bactrim? He wants to know if you will call him @ [phone] because he wants to speak with you about all of this? I did not see any documentation in his chart about anything from last night.   Sharyn Lull

## 2018-07-22 NOTE — Telephone Encounter (Signed)
[name] states he has not urinated since 8am and his belly is tight.  I have advised him to self cath and call back with his post void volume.

## 2018-07-23 ENCOUNTER — Telehealth: Payer: Self-pay | Admitting: Urology

## 2018-07-23 NOTE — Telephone Encounter (Signed)
Returned call to patient. He states that he had not taken his Tamsulosin for a few days but once he started taking it again yesterday evening, less than 2 hours later he was able to void and has no other problems since. Believes his discomfort is from self cathing. He has contacted Dr Crystals office regarding questions about his cancer treatments and any residual effects. He is waiting for a response. Advised patient to continue Tamsulosin as directed per Shannon's last office visit. Patient verbalized understanding.

## 2018-07-23 NOTE — Telephone Encounter (Signed)
Pt would like a call back, he is having some pain with urination. He also states that he also started back on Flomax and is urinating better. Please advise.

## 2018-07-23 NOTE — Telephone Encounter (Signed)
Patient called the after hours line and advised the RN that he has not been able to void very much since self cathing himself earlier that day. He believes he met resistance when self cathing. Returned call to patient and left message to call the office.

## 2018-07-25 ENCOUNTER — Telehealth: Payer: Self-pay | Admitting: Urology

## 2018-07-25 MED ORDER — SULFAMETHOXAZOLE-TRIMETHOPRIM 800-160 MG PO TABS: 1.0000 | ORAL_TABLET | Freq: Two times a day (BID) | ORAL | 0 refills | Status: DC

## 2018-07-25 NOTE — Telephone Encounter (Signed)
Pt is still having pain, mostly with urination, but as well as when not urination. Pt is concerned and would like some advice. Pt doesn't think it is from self cathing. Please advise pt on his home phone at [phone]. Thanks

## 2018-07-25 NOTE — Telephone Encounter (Signed)
Patient notified script sent and apt made for Monday at 2:30

## 2018-07-25 NOTE — Telephone Encounter (Signed)
We can send in Bactrim DS, BID x 5 days and he needs to come in on Monday to see me.

## 2018-07-25 NOTE — Telephone Encounter (Signed)
Pt is still having pain, mostly with urination, but as well as when not urination. Pt is concerned and would like some advice. Pt doesn't think it is from self cathing. Please advise pt on his home phone at [phone]. Thanks

## 2018-07-28 NOTE — Progress Notes (Signed)
03/27/2018 3:30 PM   Gary [name] [birth year] [id number]  Referring provider: Kirk Ruths, MD [name] [place], [place] [address]  Chief Complaint  Patient presents with  . urinary hesitancy  . Prostate Cancer   HPI: Patient is a 68 year old Caucasian male with prostate cancer, BPH with LU TS who presents today with the complaints of with difficulty urinating.    He states his symptoms originally presented as urinary hesitancy associated with pain prior to urination, frequency, incontinence, straining to urinate, weak urinary stream, hesitancy and nocturia x 3-4.  PVR was 121 mL and UA was negative.  He was instructed to increase his Flomax to 0.8 mg daily and instructed on CIC.    He called back a few days later with the complaint of cloudy, foul smelling urine. Bactrim DS was called in for him due to pandemic, but he still continue to have symptoms.  He contacted the on call service a few days later and was instructed to CIC.  He held off, but he did cath himself.  He stated he got a whole lot out but he felt that he met significant resistance when passing the catheter.  He did not have any gross hematuria.    He then called back stating he was still having pain with urination.  Bactrim was sent in again and he was instructed to come in to see me today.    Today he still complains of frequency x6, painful urination, nocturia x2-3, urgent continence, urinary hesitancy, urinary intermittency, straining to urinate, weak urinary stream and penile pain with and without urination.  Patient denies any gross hematuria or suprapubic/flank pain.  Patient denies any fevers, chills, nausea or vomiting.  He states he noted no difference in his urinary symptoms with increasing tamsulosin.  UA today is bland.  His PVR is 99 mL.    He did mention during today's visit that Dr. Maryan [name] had dilated him in the past for a stricture.      Prostate  Cancer  Prostate MRI from 12/09/2017 was remarkable for a 11 g prostate. There was a PI-RADS 3 lesion in the left mid gland rating 6 mm. There was no evidence of pelvic adenopathy or extracapsular/seminal vesicle involvement.   He had undergone MR fusion biopsy in [place] on 10/17 /2019 where the prostate volume was calculated to be 19 g. Standard 12 core biopsies were performed in addition to 3 biopsies on the region of interest.  Pathology: 12/15 cores positive for adenocarcinoma the prostate.  All left-sided cores including the ROI biopsies were positive for Gleason 4+3/3+3 adenocarcinoma.  One core showed Gleason 3+3 adenocarcinoma.  Refer to the scanned pathology report for details.  His bone scan from 02/19/2018 showed degenerative changes. There were areas of increased uptake in the L3 vertebral body and ribs. MR of lumbar spine from 03/20/2018 showed no evidence of metastatic disease.   He received 6 months Lupron on 05/03/2018.  He has completed radiation on 06/24/2018.    PMH: Past Medical History:  Diagnosis Date  . Anxiety   . Benign essential HTN 06/24/2015   Last Assessment & Plan:  Taking medications without noted side effects or dizziness.   Overview:  Last Assessment & Plan:  Is compliant with hypertensive medications without clear side effects or lack of control.    Marland Kitchen BPH (benign prostatic hyperplasia)   . COPD (chronic obstructive pulmonary disease) ([place])   . Coronary artery  disease 02/16/2014   Overview:  Minimal disease by cath 12/13  Last Assessment & Plan:  Seems to be tolerating medical regimen without significant side effects and symptoms such as worsening chest pain are not noted.   Overview:  Overview:  Minimal disease by cath 12/13  Last Assessment & Plan:  Seems to be tolerating medical regimen without significant side effects and symptoms such as worsening chest pain are not no  . DDD (degenerative disc disease), cervical   . Degenerative disc disease, cervical  01/22/2017  . Depression   . DJD (degenerative joint disease)   . Emphysema of lung ([place])   . Heel pain 10/10/2017  . Hyperglobulinemia   . Lumbar degenerative disc disease 01/22/2017  . Major depression in remission ([place]) 03/10/2014   Last Assessment & Plan:  Mood is doing well on meds Overview:  Last Assessment & Plan:  Mood is doing well on meds   . Migraines   . Mitral regurgitation   . Mixed hyperlipidemia 02/16/2014   Last Assessment & Plan:  Diet for healthy cholesterol is being attempted and no clear myalgia's or other side effects are noted.   Overview:  Last Assessment & Plan:  Low fat diet is being attempted and no significant side effects such as myalgia's are noted.    . Neck pain 01/24/2017  . OSA (obstructive sleep apnea) 07/05/2016   Last Assessment & Plan:  Continues to use cpap consistently and is continuing to benefit from it's use.    . Osteoarthritis of knee 08/12/2014   Overview:  Right knee is worse now  Last Assessment & Plan:  Diffuse pain on back in hips and knees also. Went to a pain clinic  Overview:  Overview:  Right knee is worse now  Last Assessment & Plan:  Diffuse pain on back in hips and knees also. Went to a pain clinic   . RA (rheumatoid arthritis) ([place])   . Spinal stenosis of lumbar region with neurogenic claudication 04/18/2016  . Spondylosis without myelopathy or radiculopathy, lumbar region 01/22/2017  . Venous insufficiency of both lower extremities 11/07/2016    Surgical History: Past Surgical History:  Procedure Laterality Date  . CARDIAC CATHETERIZATION    . CHOLECYSTECTOMY    . COLONOSCOPY    . COLONOSCOPY WITH PROPOFOL N/A 01/09/2018   Procedure: COLONOSCOPY WITH PROPOFOL;  Surgeon: Toledo, Gary Pike, MD;  Location: [facility] ENDOSCOPY;  Service: Gastroenterology;  Laterality: N/A;  . deviated septum repair    . ESOPHAGOGASTRODUODENOSCOPY (EGD) WITH PROPOFOL N/A 01/09/2018   Procedure: ESOPHAGOGASTRODUODENOSCOPY (EGD) WITH PROPOFOL;  Surgeon: Toledo,  Gary Pike, MD;  Location: [facility] ENDOSCOPY;  Service: Gastroenterology;  Laterality: N/A;  . KNEE SURGERY Left   . NASAL SEPTUM SURGERY      Home Medications:  Allergies as of 07/29/2018      Reactions   Niacin-lovastatin Er Other (See Comments)   unknown unknown   Atorvastatin Other (See Comments)   Simvastatin Other (See Comments)   Propoxyphene Nausea Only   Passed out after taking it on an empty stomach      Medication List       Accurate as of July 29, 2018  3:30 PM. Always use your most recent med list.        acetaminophen 500 MG tablet Commonly known as:  TYLENOL Take as needed by mouth.   Albuterol Sulfate 108 (90 Base) MCG/ACT Aepb Inhale as needed into the lungs.   aspirin 81 MG tablet Take by mouth.  buPROPion 300 MG 24 hr tablet Commonly known as:  WELLBUTRIN XL Take by mouth.   cholecalciferol 25 MCG (1000 UT) tablet Commonly known as:  VITAMIN D Take by mouth.   clobetasol ointment 0.05 % Commonly known as:  TEMOVATE Apply 1 application topically 2 (two) times daily.   clotrimazole-betamethasone cream Commonly known as:  LOTRISONE Apply 1 application topically 2 (two) times daily.   diclofenac sodium 1 % Gel Commonly known as:  VOLTAREN Apply topically 4 (four) times daily.   DULoxetine 60 MG capsule Commonly known as:  CYMBALTA Take 1 capsule (60 mg total) by mouth daily.   esomeprazole 40 MG capsule Commonly known as:  NEXIUM Take by mouth.   ezetimibe 10 MG tablet Commonly known as:  ZETIA   fenofibrate 48 MG tablet Commonly known as:  TRICOR Take 48 mg daily by mouth.   finasteride 5 MG tablet Commonly known as:  PROSCAR Take 5 mg daily by mouth.   gabapentin 300 MG capsule Commonly known as:  NEURONTIN Take 2 capsules (600 mg total) by mouth at bedtime.   hydrochlorothiazide 25 MG tablet Commonly known as:  HYDRODIURIL   ibuprofen 200 MG tablet Commonly known as:  ADVIL Take 200 mg by mouth every 6 (six) hours as  needed.   meloxicam 15 MG tablet Commonly known as:  MOBIC Take 15 mg by mouth daily.   methocarbamol 500 MG tablet Commonly known as:  ROBAXIN Take 500 mg by mouth 2 (two) times daily as needed for muscle spasms.   mirabegron ER 25 MG Tb24 tablet Commonly known as:  MYRBETRIQ Take 1 tablet (25 mg total) by mouth daily.   mupirocin ointment 2 % Commonly known as:  BACTROBAN Place 1 application into the nose 2 (two) times daily.   niacin-lovastatin 1000-20 MG 24 hr tablet Commonly known as:  ADVICOR Take by mouth.   oxyCODONE-acetaminophen 10-325 MG tablet Commonly known as:  Percocet Take 1 tablet by mouth every 8 (eight) hours as needed for up to 30 days for pain. Must last 30 days.   oxyCODONE-acetaminophen 10-325 MG tablet Commonly known as:  Percocet Take 1 tablet by mouth every 8 (eight) hours as needed for up to 30 days for pain. Must last 30 days. Start taking on:  Aug 25, 2018   pravastatin 20 MG tablet Commonly known as:  PRAVACHOL Take by mouth.   sildenafil 20 MG tablet Commonly known as:  REVATIO Take 3 to 5 tablets two hours before intercouse on an empty stomach.  Do not take with nitrates.   sulfamethoxazole-trimethoprim 800-160 MG tablet Commonly known as:  BACTRIM DS Take 1 tablet by mouth every 12 (twelve) hours.   sulfamethoxazole-trimethoprim 800-160 MG tablet Commonly known as:  BACTRIM DS Take 1 tablet by mouth every 12 (twelve) hours.   tamsulosin 0.4 MG Caps capsule Commonly known as:  FLOMAX Take 1 capsule (0.4 mg total) by mouth daily after supper.       Allergies:  Allergies  Allergen Reactions  . Niacin-Lovastatin Er Other (See Comments)    unknown unknown  . Atorvastatin Other (See Comments)  . Simvastatin Other (See Comments)  . Propoxyphene Nausea Only    Passed out after taking it on an empty stomach    Family History: Family History  Problem Relation Age of Onset  . Heart disease Mother     Social History:   reports that he quit smoking about 6 years ago. His smoking use included cigarettes. He has a 67.50  pack-year smoking history. He has never used smokeless tobacco. He reports that he does not drink alcohol or use drugs.  ROS: UROLOGY Frequent Urination?: Yes Hard to postpone urination?: No Burning/pain with urination?: Yes Get up at night to urinate?: Yes Leakage of urine?: Yes Urine stream starts and stops?: Yes Trouble starting stream?: Yes Do you have to strain to urinate?: Yes Blood in urine?: No Urinary tract infection?: Yes Sexually transmitted disease?: No Injury to kidneys or bladder?: No Painful intercourse?: No Weak stream?: Yes Erection problems?: No Penile pain?: Yes  Gastrointestinal Nausea?: No Vomiting?: No Indigestion/heartburn?: No Diarrhea?: No Constipation?: No  Constitutional Fever: No Night sweats?: Yes Weight loss?: No Fatigue?: Yes  Skin Skin rash/lesions?: No Itching?: No  Eyes Blurred vision?: No Double vision?: No  Ears/Nose/Throat Sore throat?: No Sinus problems?: Yes  Hematologic/Lymphatic Swollen glands?: No Easy bruising?: No  Cardiovascular Leg swelling?: No Chest pain?: No  Respiratory Cough?: No Shortness of breath?: Yes  Endocrine Excessive thirst?: Yes  Musculoskeletal Back pain?: Yes Joint pain?: Yes  Neurological Headaches?: Yes Dizziness?: No  Psychologic Depression?: Yes Anxiety?: Yes  Physical Exam: BP 121/83 (BP Location: Left Arm, Patient Position: Sitting)   Pulse (!) 111   Ht '5\' 8"'  (1.727 m)   Wt 215 lb (97.5 kg)   BMI 32.69 kg/m   Constitutional:  Well nourished. Alert and oriented, No acute distress. HEENT: [place] AT, moist mucus membranes.  Trachea midline, no masses. Cardiovascular: No clubbing, cyanosis, or edema. Respiratory: Normal respiratory effort, no increased work of breathing. GI: Abdomen is soft, non tender, non distended, no abdominal masses. Liver and spleen not palpable.  No  hernias appreciated.  Stool sample for occult testing is not indicated.   GU: No CVA tenderness.  No bladder fullness or masses.  Patient with circumcised phallus.  Urethral meatus is patent.  No penile discharge. No penile lesions or rashes. Scrotum without lesions, cysts, rashes and/or edema.  Testicles are located scrotally bilaterally. No masses are appreciated in the testicles. Left and right epididymis are normal. Rectal: Patient with  normal sphincter tone. Anus and perineum without scarring or rashes. No rectal masses are appreciated. Prostate is approximately 45 grams, non tender.   Skin: No rashes, bruises or suspicious lesions. Lymph: No cervical or inguinal adenopathy. Neurologic: Grossly intact, no focal deficits, moving all 4 extremities. Psychiatric: Normal mood and affect.  Laboratory Data Urinalysis Bland.  See Epic.  I have reviewed the labs.  Pertinent Imaging: Results for JOSECARLOS, HARRIOTT (MRN [id number]) as of 07/29/2018 16:07  Ref. Range 07/29/2018 15:05  Scan Result Unknown 59m     Assessment & Plan:    1. History of urethral stricture   continue tamsulosin 0.8 mg daily Currently with significant urinary symptoms not improved with antibiotics, increase in tamsulosin or self cathing Schedule cystoscopy to evaluate for stricture disease I have explained to the patient that they will  be scheduled for a cystoscopy in our office to evaluate their bladder.  The cystoscopy consists of passing a tube with a lens up through their urethra and into their urinary bladder.   We will inject the urethra with a lidocaine gel prior to introducing the cystoscope to help with any discomfort during the procedure.   After the procedure, they might experience blood in the urine and discomfort with urination.  This will abate after the first few voids.  I have  encouraged the patient to increase water intake  during this time.  Patient denies any allergies  to lidocaine.    2. Prostate  Cancer (high volume T2b intermediate risk (unfavorable) adenocarcinoma of the prostate Appointments with Dr. Donella Stade 07/22/2018 and on 11/01/2018 with the nurse for Lupron  Return for schedule cystscopy - hx of strictures .  These notes generated with voice recognition software. I apologize for typographical errors.  Zara Council, PA-C  [facility] [address]  [place], [place] [address] [phone]

## 2018-07-29 ENCOUNTER — Other Ambulatory Visit: Payer: Self-pay

## 2018-07-29 ENCOUNTER — Ambulatory Visit (INDEPENDENT_AMBULATORY_CARE_PROVIDER_SITE_OTHER): Payer: PPO | Admitting: Urology

## 2018-07-29 ENCOUNTER — Ambulatory Visit: Payer: Non-veteran care | Admitting: Student in an Organized Health Care Education/Training Program

## 2018-07-29 ENCOUNTER — Encounter: Payer: Self-pay | Admitting: Urology

## 2018-07-29 VITALS — BP 121/83 | HR 111 | Ht 68.0 in | Wt 215.0 lb

## 2018-07-29 DIAGNOSIS — R3911 Hesitancy of micturition: Secondary | ICD-10-CM

## 2018-07-29 DIAGNOSIS — Z87448 Personal history of other diseases of urinary system: Secondary | ICD-10-CM

## 2018-07-29 DIAGNOSIS — C61 Malignant neoplasm of prostate: Secondary | ICD-10-CM

## 2018-07-29 LAB — MICROSCOPIC EXAMINATION

## 2018-07-29 LAB — URINALYSIS, COMPLETE
Bilirubin, UA: NEGATIVE
Glucose, UA: NEGATIVE
Ketones, UA: NEGATIVE
Leukocytes,UA: NEGATIVE
Nitrite, UA: NEGATIVE
RBC, UA: NEGATIVE
Specific Gravity, UA: 1.02 (ref 1.005–1.030)
Urobilinogen, Ur: 0.2 mg/dL (ref 0.2–1.0)
pH, UA: 6 (ref 5.0–7.5)

## 2018-07-29 LAB — BLADDER SCAN AMB NON-IMAGING

## 2018-07-30 ENCOUNTER — Other Ambulatory Visit: Payer: PPO | Admitting: Urology

## 2018-07-30 ENCOUNTER — Telehealth: Payer: Self-pay | Admitting: Radiology

## 2018-07-30 NOTE — Telephone Encounter (Signed)
Patient called to cancel cystoscopy appointment. He reports "problem has fixed itself and I will call back if it comes back".

## 2018-07-31 LAB — CULTURE, URINE COMPREHENSIVE

## 2018-08-01 ENCOUNTER — Telehealth: Payer: Self-pay | Admitting: *Deleted

## 2018-08-01 NOTE — Telephone Encounter (Addendum)
Informed patient-verbalized understanding.    ----- Message from Nori Riis, PA-C sent at 07/31/2018  4:37 PM EDT ----- Please let Mr. Halls know that his urine culture was negative for infection.

## 2018-08-05 DIAGNOSIS — M7522 Bicipital tendinitis, left shoulder: Secondary | ICD-10-CM | POA: Diagnosis not present

## 2018-08-05 DIAGNOSIS — M7582 Other shoulder lesions, left shoulder: Secondary | ICD-10-CM | POA: Diagnosis not present

## 2018-09-05 DIAGNOSIS — E118 Type 2 diabetes mellitus with unspecified complications: Secondary | ICD-10-CM | POA: Diagnosis not present

## 2018-09-05 DIAGNOSIS — E538 Deficiency of other specified B group vitamins: Secondary | ICD-10-CM | POA: Diagnosis not present

## 2018-09-05 DIAGNOSIS — I2510 Atherosclerotic heart disease of native coronary artery without angina pectoris: Secondary | ICD-10-CM | POA: Diagnosis not present

## 2018-09-05 DIAGNOSIS — E782 Mixed hyperlipidemia: Secondary | ICD-10-CM | POA: Diagnosis not present

## 2018-09-06 ENCOUNTER — Other Ambulatory Visit: Payer: Self-pay

## 2018-09-09 ENCOUNTER — Encounter: Payer: Self-pay | Admitting: Radiation Oncology

## 2018-09-09 ENCOUNTER — Ambulatory Visit
Admission: RE | Admit: 2018-09-09 | Discharge: 2018-09-09 | Disposition: A | Discharge status: Home / Self Care | Payer: PPO | Source: Ambulatory Visit | Attending: Radiation Oncology | Admitting: Radiation Oncology

## 2018-09-09 ENCOUNTER — Other Ambulatory Visit: Payer: Self-pay

## 2018-09-09 VITALS — BP 153/83 | HR 91 | Temp 98.3°F | Resp 18 | Wt 221.1 lb

## 2018-09-09 DIAGNOSIS — Z923 Personal history of irradiation: Secondary | ICD-10-CM | POA: Diagnosis not present

## 2018-09-09 DIAGNOSIS — C61 Malignant neoplasm of prostate: Secondary | ICD-10-CM | POA: Insufficient documentation

## 2018-09-09 DIAGNOSIS — N529 Male erectile dysfunction, unspecified: Secondary | ICD-10-CM | POA: Insufficient documentation

## 2018-09-09 NOTE — Progress Notes (Signed)
Radiation Oncology Follow up Note  Name: Gary [name]   Date:   09/09/2018 MRN:  [id number] DOB: 1950/07/08    This 68 y.o. male presents to the clinic today for 2-1/37-month follow-up status post IM RT radiation therapy for stage IIb Gleason 7 (4+3) adenocarcinoma the prostate.  REFERRING PROVIDER: Kirk Ruths, MD  HPI: Patient is a 68 year old male now about 2-1/2 months of included IMR T radiation therapy to his prostate and pelvic nodes for Gleason 7 (4+3) presenting with a PSA of 1.3 on finasteride.  Seen today in routine follow-up he is doing well states his erectile dysfunction has worsened slightly he does have Viagra which she seldom uses.  He specifically denies any increased lower urinary tract symptoms although he continues on Flomax.  He is having no bowel issues at this time.  COMPLICATIONS OF TREATMENT: none  FOLLOW UP COMPLIANCE: keeps appointments   PHYSICAL EXAM:  There were no vitals taken for this visit. Well-developed well-nourished patient in NAD. HEENT reveals PERLA, EOMI, discs not visualized.  Oral cavity is clear. No oral mucosal lesions are identified. Neck is clear without evidence of cervical or supraclavicular adenopathy. Lungs are clear to A&P. Cardiac examination is essentially unremarkable with regular rate and rhythm without murmur rub or thrill. Abdomen is benign with no organomegaly or masses noted. Motor sensory and DTR levels are equal and symmetric in the upper and lower extremities. Cranial nerves II through XII are grossly intact. Proprioception is intact. No peripheral adenopathy or edema is identified. No motor or sensory levels are noted. Crude visual fields are within normal range.  RADIOLOGY RESULTS: No current films for review  PLAN: Present time patient is doing well with low side effect profile.  Await another 2 months repeat his PSA at that time.  Pat follow-up appointment and PSA were given.  Patient may benefit from Viagra although  has that will ask his PMD for refill a prescription should be needed.  Patient is to call with any concerns.  I would like to take this opportunity to thank you for allowing me to participate in the care of your patient.Noreene Filbert, MD

## 2018-09-12 DIAGNOSIS — E782 Mixed hyperlipidemia: Secondary | ICD-10-CM | POA: Diagnosis not present

## 2018-09-12 DIAGNOSIS — I10 Essential (primary) hypertension: Secondary | ICD-10-CM | POA: Diagnosis not present

## 2018-09-12 DIAGNOSIS — I2510 Atherosclerotic heart disease of native coronary artery without angina pectoris: Secondary | ICD-10-CM | POA: Diagnosis not present

## 2018-09-12 DIAGNOSIS — M791 Myalgia, unspecified site: Secondary | ICD-10-CM | POA: Diagnosis not present

## 2018-09-12 DIAGNOSIS — T466X5A Adverse effect of antihyperlipidemic and antiarteriosclerotic drugs, initial encounter: Secondary | ICD-10-CM | POA: Diagnosis not present

## 2018-09-12 DIAGNOSIS — E538 Deficiency of other specified B group vitamins: Secondary | ICD-10-CM | POA: Diagnosis not present

## 2018-09-12 DIAGNOSIS — F325 Major depressive disorder, single episode, in full remission: Secondary | ICD-10-CM | POA: Diagnosis not present

## 2018-09-12 DIAGNOSIS — E118 Type 2 diabetes mellitus with unspecified complications: Secondary | ICD-10-CM | POA: Diagnosis not present

## 2018-09-12 DIAGNOSIS — G4733 Obstructive sleep apnea (adult) (pediatric): Secondary | ICD-10-CM | POA: Diagnosis not present

## 2018-09-17 ENCOUNTER — Other Ambulatory Visit: Payer: Self-pay

## 2018-09-17 ENCOUNTER — Ambulatory Visit
Payer: Non-veteran care | Attending: Student in an Organized Health Care Education/Training Program | Admitting: Student in an Organized Health Care Education/Training Program

## 2018-09-17 ENCOUNTER — Encounter: Payer: Self-pay | Admitting: Student in an Organized Health Care Education/Training Program

## 2018-09-17 DIAGNOSIS — M47816 Spondylosis without myelopathy or radiculopathy, lumbar region: Secondary | ICD-10-CM | POA: Diagnosis not present

## 2018-09-17 DIAGNOSIS — Z79891 Long term (current) use of opiate analgesic: Secondary | ICD-10-CM | POA: Diagnosis not present

## 2018-09-17 DIAGNOSIS — G8929 Other chronic pain: Secondary | ICD-10-CM

## 2018-09-17 DIAGNOSIS — M5030 Other cervical disc degeneration, unspecified cervical region: Secondary | ICD-10-CM | POA: Diagnosis not present

## 2018-09-17 DIAGNOSIS — G894 Chronic pain syndrome: Secondary | ICD-10-CM

## 2018-09-17 DIAGNOSIS — M25512 Pain in left shoulder: Secondary | ICD-10-CM | POA: Diagnosis not present

## 2018-09-17 DIAGNOSIS — M5136 Other intervertebral disc degeneration, lumbar region: Secondary | ICD-10-CM | POA: Diagnosis not present

## 2018-09-17 MED ORDER — OXYCODONE-ACETAMINOPHEN 10-325 MG PO TABS: 1.0000 | ORAL_TABLET | Freq: Three times a day (TID) | ORAL | 0 refills | Status: AC | PRN

## 2018-09-17 MED ORDER — OXYCODONE-ACETAMINOPHEN 10-325 MG PO TABS: 1.0000 | ORAL_TABLET | Freq: Three times a day (TID) | ORAL | 0 refills | Status: DC | PRN

## 2018-09-17 NOTE — Progress Notes (Signed)
Pain Management Virtual Encounter Note - Virtual Visit via Telephone Telehealth (real-time audio visits between healthcare provider and patient).   Patient's Phone No. & Preferred Pharmacy:  [phone] (home); [phone] (mobile); (Preferred) [phone] No e-mail address on record  [place], [place] - [address] [place] [place] [address] Phone: [phone] Fax: [phone]    Pre-screening note:  Our staff contacted Mr. Morten and offered him an "in person", "face-to-face" appointment versus a telephone encounter. He indicated preferring the telephone encounter, at this time.   Reason for Virtual Visit: COVID-19*  Social distancing based on CDC and AMA recommendations.   I contacted Charlean Merl on 09/17/2018 via telephone.      I clearly identified myself as Gillis Santa, MD. I verified that I was speaking with the correct person using two identifiers (Name: CHRISTPHER STOGSDILL, and date of birth: Aug 22, 1950).  Advanced Informed Consent I sought verbal advanced consent from Charlean Merl for virtual visit interactions. I informed Mr. Zielke of possible security and privacy concerns, risks, and limitations associated with providing "not-in-person" medical evaluation and management services. I also informed Mr. Perfect of the availability of "in-person" appointments. Finally, I informed him that there would be a charge for the virtual visit and that he could be  personally, fully or partially, financially responsible for it. Mr. Abe expressed understanding and agreed to proceed.   Historic Elements   Mr. EILEEN CROSWELL is a 68 y.o. year old, male patient evaluated today after his last encounter by our practice on 06/25/2018. Mr. Pudlo  has a past medical history of Anxiety, Benign essential HTN (06/24/2015), BPH (benign prostatic hyperplasia), COPD (chronic obstructive pulmonary disease) ([place]), Coronary artery disease (02/16/2014), DDD  (degenerative disc disease), cervical, Degenerative disc disease, cervical (01/22/2017), Depression, DJD (degenerative joint disease), Emphysema of lung ([place]), Heel pain (10/10/2017), Hyperglobulinemia, Lumbar degenerative disc disease (01/22/2017), Major depression in remission ([place]) (03/10/2014), Migraines, Mitral regurgitation, Mixed hyperlipidemia (02/16/2014), Neck pain (01/24/2017), OSA (obstructive sleep apnea) (07/05/2016), Osteoarthritis of knee (08/12/2014), RA (rheumatoid arthritis) ([place]), Spinal stenosis of lumbar region with neurogenic claudication (04/18/2016), Spondylosis without myelopathy or radiculopathy, lumbar region (01/22/2017), and Venous insufficiency of both lower extremities (11/07/2016). He also  has a past surgical history that includes Cholecystectomy; Knee surgery (Left); deviated septum repair; Cardiac catheterization; Colonoscopy; Nasal septum surgery; Colonoscopy with propofol (N/A, 01/09/2018); and Esophagogastroduodenoscopy (egd) with propofol (N/A, 01/09/2018). Mr. Ang has a current medication list which includes the following prescription(s): acetaminophen, albuterol sulfate, buspirone, clobetasol ointment, clotrimazole-betamethasone, diclofenac sodium, duloxetine, esomeprazole, ezetimibe, finasteride, gabapentin, hydrochlorothiazide, ibuprofen, oxycodone-acetaminophen, oxycodone-acetaminophen, oxycodone-acetaminophen, sildenafil, tamsulosin, aspirin, bupropion, cholecalciferol, esomeprazole, fenofibrate, meloxicam, methocarbamol, mupirocin ointment, niacin-lovastatin, and pravastatin. He  reports that he quit smoking about 6 years ago. His smoking use included cigarettes. He has a 67.50 pack-year smoking history. He has never used smokeless tobacco. He reports that he does not drink alcohol or use drugs. Mr. Falwell is allergic to niacin-lovastatin er; atorvastatin; simvastatin; and propoxyphene.   HPI  Today, he is being contacted for medication management.   No change in medical  history. Pain at baseline.  Medications help him function and also reduce his overall pain.  Patient has seen Dr. Roland Rack regarding his left shoulder and surgery has been recommended but the patient wants to hold off on left shoulder specifically left shoulder arthroscopy with debridement, decompression, possible rotator cuff repair and biceps tenodesis.  He would like for this COVID situation to resolve even further which I think  is reasonable since this is an elective procedure.  We will refill medications as below.  Pharmacotherapy Assessment   08/24/2018  2   06/25/2018  Oxycodone-Acetaminophen 10-325  90.00 30 Bi Lat   [id number]   Thr (4878)   0  45.00 MME  Medicare   [place]    Monitoring: Pharmacotherapy: No side-effects or adverse reactions reported. [place] PMP: PDMP reviewed during this encounter.       Compliance: No problems identified. Effectiveness: Clinically acceptable. Plan: Refer to "POC".  Pertinent Labs   SAFETY SCREENING Profile No results found for: SARSCOV2NAA, COVIDSOURCE, STAPHAUREUS, MRSAPCR, HCVAB, HIV, PREGTESTUR Renal Function No results found for: BUN, CREATININE, BCR, GFRAA, GFRNONAA Hepatic Function No results found for: AST, ALT, ALBUMIN UDS Summary  Date Value Ref Range Status  05/28/2018 FINAL  Final    Comment:    ==================================================================== TOXASSURE SELECT 13 (MW) ==================================================================== Test                             Result       Flag       Units Drug Present   Oxycodone                      413                     ng/mg creat   Oxymorphone                    349                     ng/mg creat   Noroxycodone                   584                     ng/mg creat   Noroxymorphone                 158                     ng/mg creat    Sources of oxycodone are scheduled prescription medications.    Oxymorphone, noroxycodone, and noroxymorphone are expected    metabolites  of oxycodone. Oxymorphone is also available as a    scheduled prescription medication. ==================================================================== Test                      Result    Flag   Units      Ref Range   Creatinine              190              mg/dL      >=20 ==================================================================== Declared Medications:  Medication list was not provided. ==================================================================== For clinical consultation, please call [phone]. ====================================================================    Note: Above Lab results reviewed.  Recent imaging  CT CHEST LUNG CANCER SCREENING LOW DOSE WO CONTRAST CLINICAL DATA:  68 year old male former smoker (quit 6 years ago) with 31 pack-year history of smoking. Lung cancer screening examination.  EXAM: CT CHEST WITHOUT CONTRAST LOW-DOSE FOR LUNG CANCER SCREENING  TECHNIQUE: Multidetector CT imaging of the chest was performed following the standard protocol without IV contrast.  COMPARISON:  Chest CT 09/01/2008.  FINDINGS: Cardiovascular: Heart size is normal. There is no significant pericardial fluid, thickening or pericardial calcification.  Aortic atherosclerosis. No definite coronary artery calcifications.  Mediastinum/Nodes: No pathologically enlarged mediastinal or hilar lymph nodes. Please note that accurate exclusion of hilar adenopathy is limited on noncontrast CT scans. Esophagus is unremarkable in appearance. No axillary lymphadenopathy.  Lungs/Pleura: Tiny pulmonary nodule in the periphery of the right upper lobe (axial image 76 of series 3), with a volume derived mean diameter of only 4.0 mm. No larger more suspicious appearing pulmonary nodules or masses are noted. No acute consolidative airspace disease. No pleural effusions. Mild diffuse bronchial wall thickening with mild centrilobular and paraseptal emphysema.  Upper  Abdomen: Diffuse low attenuation throughout the visualized hepatic parenchyma, indicative of hepatic steatosis. Aortic atherosclerosis.  Musculoskeletal: There are no aggressive appearing lytic or blastic lesions noted in the visualized portions of the skeleton.  IMPRESSION: 1. Lung-RADS 2, benign appearance or behavior. Continue annual screening with low-dose chest CT without contrast in 12 months. 2. Mild diffuse bronchial wall thickening with mild centrilobular and paraseptal emphysema; imaging findings suggestive of underlying COPD. 3. Aortic atherosclerosis. 4. Hepatic steatosis.  Aortic Atherosclerosis (ICD10-I70.0) and Emphysema (ICD10-J43.9).  Electronically Signed   By: Vinnie Langton M.D.   On: 05/22/2018 08:26  Assessment  The primary encounter diagnosis was Chronic left shoulder pain. Diagnoses of Spondylosis without myelopathy or radiculopathy, lumbar region, Lumbar spondylosis, Long term prescription opiate use, Chronic pain syndrome, Lumbar degenerative disc disease, Facet arthropathy, lumbar, and Degenerative disc disease, cervical were also pertinent to this visit.  Plan of Care  I have discontinued Dellis Filbert C. Orlov's sulfamethoxazole-trimethoprim, mirabegron ER, and sulfamethoxazole-trimethoprim. I am also having him start on oxyCODONE-acetaminophen and oxyCODONE-acetaminophen. Additionally, I am having him maintain his acetaminophen, Albuterol Sulfate, cholecalciferol, fenofibrate, finasteride, niacin-lovastatin, pravastatin, aspirin, buPROPion, esomeprazole, hydrochlorothiazide, ezetimibe, sildenafil, methocarbamol, meloxicam, clobetasol ointment, clotrimazole-betamethasone, diclofenac sodium, ibuprofen, mupirocin ointment, DULoxetine, gabapentin, tamsulosin, busPIRone, esomeprazole, and oxyCODONE-acetaminophen.  Overall patient's pain is at baseline.  He states that he is going to postpone his left shoulder arthroscopy and debridement and possible rotator cuff  repair until after COVID-19 situation improves.  He also states that his right-sided SI joint pain improved on its own.  He was scheduled for a right-sided sacroiliac joint injection but canceled that since his right SI joint pain improved.  Patient continues to have axial low back pain that is managed on his multimodal pain regimen of oxycodone 10 mg 3 times daily as needed, ibuprofen 200 mg every 6 hours as needed which she alternates with meloxicam. I reinforced to patient to NOT take both Ibuprofen and Mobic together.  Patient also takes Robaxin 500 mg twice daily as needed for his lumbar paraspinal muscle spasms.  He is also on Cymbalta 60 mg daily along with gabapentin 600 mg nightly.  Refill of oxycodone as below for 3 months.  PMP checked and appropriate.  Patient will follow-up in person in 3 months for inpatient face-to-face visit and physical exam. Perform UDS at that visit as well.   I have instructed the patient to call the clinic if he does have return of his SI joint related pain so that we can consider right sacroiliac joint injection.  Patient endorsed understanding.  Pharmacotherapy (Medications Ordered): Meds ordered this encounter  Medications  . oxyCODONE-acetaminophen (PERCOCET) 10-325 MG tablet    Sig: Take 1 tablet by mouth every 8 (eight) hours as needed for up to 30 days for pain. Must last 30 days.    Dispense:  90 tablet    Refill:  0    [place] STOP ACT -  Not applicable. Fill one day early if pharmacy is closed on scheduled refill date.  [place] Kitchen oxyCODONE-acetaminophen (PERCOCET) 10-325 MG tablet    Sig: Take 1 tablet by mouth every 8 (eight) hours as needed for up to 30 days for pain. Must last 30 days.    Dispense:  90 tablet    Refill:  0    [place] STOP ACT - Not applicable. Fill one day early if pharmacy is closed on scheduled refill date.  Marland Kitchen oxyCODONE-acetaminophen (PERCOCET) 10-325 MG tablet    Sig: Take 1 tablet by mouth every 8 (eight) hours as needed for up to 30 days for  pain. Must last 30 days.    Dispense:  90 tablet    Refill:  0    [place] STOP ACT - Not applicable. Fill one day early if pharmacy is closed on scheduled refill date.   Orders:  No orders of the defined types were placed in this encounter.  Follow-up plan:   Return in about 3 months (around 12/18/2018) for Medication Management.    I discussed the assessment and treatment plan with the patient. The patient was provided an opportunity to ask questions and all were answered. The patient agreed with the plan and demonstrated an understanding of the instructions.  Patient advised to call back or seek an in-person evaluation if the symptoms or condition worsens.  Total duration of non-face-to-face encounter: 25 minutes.  Note by: Gillis Santa, MD Date: 09/17/2018; Time: 12:08 PM  Note: This dictation was prepared with Dragon dictation. Any transcriptional errors that may result from this process are unintentional.  Disclaimer:  * Given the special circumstances of the COVID-19 pandemic, the federal government has announced that the Office for Civil Rights (OCR) will exercise its enforcement discretion and will not impose penalties on physicians using telehealth in the event of noncompliance with regulatory requirements under the [place] and [place] (HIPAA) in connection with the good faith provision of telehealth during the ZOXWR-60 national public health emergency. ([place])

## 2018-09-23 ENCOUNTER — Other Ambulatory Visit: Payer: Self-pay | Admitting: Radiation Oncology

## 2018-10-25 DIAGNOSIS — I2510 Atherosclerotic heart disease of native coronary artery without angina pectoris: Secondary | ICD-10-CM | POA: Diagnosis not present

## 2018-10-25 DIAGNOSIS — E118 Type 2 diabetes mellitus with unspecified complications: Secondary | ICD-10-CM | POA: Diagnosis not present

## 2018-10-25 DIAGNOSIS — I10 Essential (primary) hypertension: Secondary | ICD-10-CM | POA: Diagnosis not present

## 2018-10-25 DIAGNOSIS — E782 Mixed hyperlipidemia: Secondary | ICD-10-CM | POA: Diagnosis not present

## 2018-10-25 DIAGNOSIS — G4733 Obstructive sleep apnea (adult) (pediatric): Secondary | ICD-10-CM | POA: Diagnosis not present

## 2018-11-01 ENCOUNTER — Ambulatory Visit (INDEPENDENT_AMBULATORY_CARE_PROVIDER_SITE_OTHER): Payer: PPO | Admitting: *Deleted

## 2018-11-01 ENCOUNTER — Other Ambulatory Visit: Payer: Self-pay

## 2018-11-01 DIAGNOSIS — C61 Malignant neoplasm of prostate: Secondary | ICD-10-CM

## 2018-11-01 MED ORDER — LEUPROLIDE ACETATE (6 MONTH) 45 MG IM KIT
45.0000 mg | PACK | Freq: Once | INTRAMUSCULAR | Status: AC
  Administered 2018-11-01: 45 mg via INTRAMUSCULAR

## 2018-11-01 NOTE — Progress Notes (Signed)
Lupron IM Injection   Due to Prostate Cancer patient is present today for a Lupron Injection.  Medication: Lupron 6 month Dose: 45 mg  Location: right upper outer buttocks Lot: [id number] Exp: 11/19/2020  Patient tolerated well, no complications were noted  Performed by: Ayan Heffington,CMA  Follow up: As scheduled

## 2018-11-08 ENCOUNTER — Other Ambulatory Visit: Payer: Self-pay

## 2018-11-08 DIAGNOSIS — Z20822 Contact with and (suspected) exposure to covid-19: Secondary | ICD-10-CM

## 2018-11-08 DIAGNOSIS — R6889 Other general symptoms and signs: Secondary | ICD-10-CM | POA: Diagnosis not present

## 2018-11-11 DIAGNOSIS — M5440 Lumbago with sciatica, unspecified side: Secondary | ICD-10-CM | POA: Diagnosis not present

## 2018-11-11 DIAGNOSIS — I700 Atherosclerosis of aorta: Secondary | ICD-10-CM | POA: Insufficient documentation

## 2018-11-11 DIAGNOSIS — Z125 Encounter for screening for malignant neoplasm of prostate: Secondary | ICD-10-CM | POA: Diagnosis not present

## 2018-11-11 DIAGNOSIS — M5136 Other intervertebral disc degeneration, lumbar region: Secondary | ICD-10-CM | POA: Diagnosis not present

## 2018-11-11 LAB — NOVEL CORONAVIRUS, NAA: SARS-CoV-2, NAA: NOT DETECTED

## 2018-11-14 ENCOUNTER — Encounter: Payer: Self-pay | Admitting: Student in an Organized Health Care Education/Training Program

## 2018-11-18 ENCOUNTER — Encounter: Payer: Self-pay | Admitting: Student in an Organized Health Care Education/Training Program

## 2018-11-18 ENCOUNTER — Ambulatory Visit
Payer: PPO | Attending: Student in an Organized Health Care Education/Training Program | Admitting: Student in an Organized Health Care Education/Training Program

## 2018-11-18 ENCOUNTER — Other Ambulatory Visit: Payer: Self-pay

## 2018-11-18 DIAGNOSIS — G894 Chronic pain syndrome: Secondary | ICD-10-CM

## 2018-11-18 DIAGNOSIS — M533 Sacrococcygeal disorders, not elsewhere classified: Secondary | ICD-10-CM

## 2018-11-18 DIAGNOSIS — M47818 Spondylosis without myelopathy or radiculopathy, sacral and sacrococcygeal region: Secondary | ICD-10-CM

## 2018-11-18 DIAGNOSIS — G8929 Other chronic pain: Secondary | ICD-10-CM

## 2018-11-18 NOTE — Progress Notes (Addendum)
Pain Management Virtual Encounter Note - Virtual Visit via [place] (real-time audio visits between healthcare provider and patient).   Patient's Phone No. & Preferred Pharmacy:  [phone] (home); [phone] (mobile); (Preferred) [phone] No e-mail address on record  [name], [place] - [address] [place] [address] Phone: [phone] Fax: [phone]    Pre-screening note:  Our staff contacted Gary [name] and offered him an "in person", "face-to-face" appointment versus a telephone encounter. He indicated preferring the telephone encounter, at this time.   Reason for Virtual Visit: COVID-19*  Social distancing based on CDC and AMA recommendations.   I contacted Gary [name] on 11/18/2018 via telephone.      I clearly identified myself as Gillis Santa, MD. I verified that I was speaking with the correct person using two identifiers (Name: Gary [name], and date of birth: [birth year]).  Advanced Informed Consent I sought verbal advanced consent from Gary [name] for virtual visit interactions. I informed Gary [name] of possible security and privacy concerns, risks, and limitations associated with providing "not-in-person" medical evaluation and management services. I also informed Gary [name] of the availability of "in-person" appointments. Finally, I informed him that there would be a charge for the virtual visit and that he could be  personally, fully or partially, financially responsible for it. Gary [name] expressed understanding and agreed to proceed.   Historic Elements   Gary [name] is a 68 y.o. year old, male patient evaluated today after his last encounter by our practice on 09/17/2018. Gary [name]  has a past medical history of Anxiety, Benign essential HTN (06/24/2015), BPH (benign prostatic hyperplasia), COPD (chronic obstructive pulmonary disease) ([place]), Coronary artery disease  (02/16/2014), DDD (degenerative disc disease), cervical, Degenerative disc disease, cervical (01/22/2017), Depression, DJD (degenerative joint disease), Emphysema of lung ([place]), Heel pain (10/10/2017), Hyperglobulinemia, Lumbar degenerative disc disease (01/22/2017), Major depression in remission ([place]) (03/10/2014), Migraines, Mitral regurgitation, Mixed hyperlipidemia (02/16/2014), Neck pain (01/24/2017), OSA (obstructive sleep apnea) (07/05/2016), Osteoarthritis of knee (08/12/2014), RA (rheumatoid arthritis) ([place]), Spinal stenosis of lumbar region with neurogenic claudication (04/18/2016), Spondylosis without myelopathy or radiculopathy, lumbar region (01/22/2017), and Venous insufficiency of both lower extremities (11/07/2016). He also  has a past surgical history that includes Cholecystectomy; Knee surgery (Left); deviated septum repair; Cardiac catheterization; Colonoscopy; Nasal septum surgery; Colonoscopy with propofol (N/A, 01/09/2018); and Esophagogastroduodenoscopy (egd) with propofol (N/A, 01/09/2018). Gary [name] has a current medication list which includes the following prescription(s): acetaminophen, albuterol sulfate, aspirin, atorvastatin, bupropion, buspirone, clobetasol ointment, clotrimazole-betamethasone, diclofenac sodium, duloxetine, esomeprazole, ezetimibe, fenofibrate, finasteride, gabapentin, hydrochlorothiazide, mupirocin ointment, oxycodone-acetaminophen, oxycodone-acetaminophen, sildenafil, tamsulosin, cholecalciferol, esomeprazole, ibuprofen, meloxicam, methocarbamol, niacin-lovastatin, and pravastatin. He  reports that he quit smoking about 6 years ago. His smoking use included cigarettes. He has a 67.50 pack-year smoking history. He has never used smokeless tobacco. He reports that he does not drink alcohol or use drugs. Gary [name] is allergic to niacin-lovastatin er; atorvastatin; simvastatin; and propoxyphene.   HPI  Today, he is being contacted for worsening of previously known  (established) problem  Increased bilateral hip and groin pain related to SI joint arthritis.  Right greater than left.  Discussed diagnostic sacroiliac joint injection.  Risks and benefits reviewed and patient would like to proceed.  UDS:  Summary  Date Value Ref Range Status  05/28/2018 FINAL  Final    Comment:    ==================================================================== TOXASSURE SELECT 13 (MW) ==================================================================== Test  Result       Flag       Units Drug Present   Oxycodone                      413                     ng/mg creat   Oxymorphone                    349                     ng/mg creat   Noroxycodone                   584                     ng/mg creat   Noroxymorphone                 158                     ng/mg creat    Sources of oxycodone are scheduled prescription medications.    Oxymorphone, noroxycodone, and noroxymorphone are expected    metabolites of oxycodone. Oxymorphone is also available as a    scheduled prescription medication. ==================================================================== Test                      Result    Flag   Units      Ref Range   Creatinine              190              mg/dL      >=20 ==================================================================== Declared Medications:  Medication list was not provided. ==================================================================== For clinical consultation, please call [phone]. ====================================================================     Assessment  The primary encounter diagnosis was SI (sacroiliac) joint dysfunction. Diagnoses of SI joint arthritis, Chronic right SI joint pain, and Chronic pain syndrome were also pertinent to this visit.  Plan of Care  I am having Gary [name] maintain his acetaminophen, Albuterol Sulfate, cholecalciferol, fenofibrate,  finasteride, niacin-lovastatin, pravastatin, aspirin, buPROPion, esomeprazole, hydrochlorothiazide, ezetimibe, sildenafil, methocarbamol, meloxicam, clobetasol ointment, clotrimazole-betamethasone, diclofenac sodium, ibuprofen, mupirocin ointment, DULoxetine, gabapentin, busPIRone, esomeprazole, oxyCODONE-acetaminophen, oxyCODONE-acetaminophen, tamsulosin, and atorvastatin.  1. SI (sacroiliac) joint dysfunction - SACROILIAC JOINT INJECTION; Future  2. SI joint arthritis - SACROILIAC JOINT INJECTION; Future  3. Chronic right SI joint pain - SACROILIAC JOINT INJECTION; Future  4. Chronic pain syndrome Continue as needed Tylenol, Voltaren gel, Cymbalta 60 mg daily, gabapentin 600 mg nightly, Mobic 15 mg as needed, Robaxin 500 mg twice daily as needed, Percocet as needed.    Orders:  Orders Placed This Encounter  Procedures  . SACROILIAC JOINT INJECTION    Standing Status:   Future    Standing Expiration Date:   12/19/2018    Scheduling Instructions:     Side: Bilateral     Sedation: without     Timeframe: ASAP    Order Specific Question:   Where will this procedure be performed?    Answer:   [facility]   Follow-up plan:   Return in about 1 week (around 11/25/2018) for Procedure- B/L SI J w/o sed.   Recent Visits Date Type Provider Dept  09/17/18 Office Visit Gillis Santa, MD [facility]  Showing recent visits within past 90 days and  meeting all other requirements   Today's Visits Date Type Provider Dept  11/18/18 Office Visit Gillis Santa, MD [facility]  Showing today's visits and meeting all other requirements   Future Appointments Date Type Provider Dept  12/12/18 Appointment Gillis Santa, MD [facility]  Showing future appointments within next 90 days and meeting all other requirements   I discussed the assessment and treatment plan with the patient. The patient was provided an opportunity to ask questions and all were answered.  The patient agreed with the plan and demonstrated an understanding of the instructions.  Patient advised to call back or seek an in-person evaluation if the symptoms or condition worsens.  Total duration of non-face-to-face encounter: 32minutes.  Note by: Gillis Santa, MD Date: 11/18/2018; Time: 2:28 PM  Note: This dictation was prepared with Dragon dictation. Any transcriptional errors that may result from this process are unintentional.  Disclaimer:  * Given the special circumstances of the COVID-19 pandemic, the federal government has announced that the Office for Civil Rights (OCR) will exercise its enforcement discretion and will not impose penalties on physicians using telehealth in the event of noncompliance with regulatory requirements under the [place] (HIPAA) in connection with the good faith provision of telehealth during the PVXYI-01 national public health emergency. ([place])

## 2018-11-18 NOTE — Addendum Note (Signed)
Addended by: Gillis Santa on: 11/18/2018 02:28 PM   Modules accepted: Orders, Level of Service

## 2018-11-22 ENCOUNTER — Other Ambulatory Visit: Payer: Self-pay | Admitting: Student in an Organized Health Care Education/Training Program

## 2018-11-28 ENCOUNTER — Other Ambulatory Visit: Payer: PPO

## 2018-12-02 ENCOUNTER — Ambulatory Visit (HOSPITAL_BASED_OUTPATIENT_CLINIC_OR_DEPARTMENT_OTHER): Payer: PPO | Admitting: Student in an Organized Health Care Education/Training Program

## 2018-12-02 ENCOUNTER — Other Ambulatory Visit: Payer: Self-pay

## 2018-12-02 ENCOUNTER — Encounter: Payer: Self-pay | Admitting: Student in an Organized Health Care Education/Training Program

## 2018-12-02 ENCOUNTER — Ambulatory Visit
Admission: RE | Admit: 2018-12-02 | Discharge: 2018-12-02 | Disposition: A | Discharge status: Home / Self Care | Payer: PPO | Source: Ambulatory Visit | Attending: Student in an Organized Health Care Education/Training Program | Admitting: Student in an Organized Health Care Education/Training Program

## 2018-12-02 VITALS — BP 129/89 | HR 95 | Temp 98.5°F | Resp 18 | Ht 68.0 in | Wt 221.0 lb

## 2018-12-02 DIAGNOSIS — M533 Sacrococcygeal disorders, not elsewhere classified: Secondary | ICD-10-CM

## 2018-12-02 MED ORDER — DEXAMETHASONE SODIUM PHOSPHATE 10 MG/ML IJ SOLN
10.0000 mg | Freq: Once | INTRAMUSCULAR | Status: AC
  Administered 2018-12-02: 10 mg
  Filled 2018-12-02: qty 1

## 2018-12-02 MED ORDER — LIDOCAINE HCL 2 % IJ SOLN
20.0000 mL | Freq: Once | INTRAMUSCULAR | Status: AC
  Administered 2018-12-02: 11:00:00 400 mg
  Filled 2018-12-02: qty 20

## 2018-12-02 MED ORDER — ROPIVACAINE HCL 2 MG/ML IJ SOLN
1.0000 mL | Freq: Once | INTRAMUSCULAR | Status: DC
  Filled 2018-12-02: qty 10

## 2018-12-02 MED ORDER — IOHEXOL 180 MG/ML  SOLN: 10.0000 mL | Freq: Once | INTRAMUSCULAR | Status: DC

## 2018-12-02 NOTE — Patient Instructions (Signed)

## 2018-12-02 NOTE — Progress Notes (Signed)
Safety precautions to be maintained throughout the outpatient stay will include: orient to surroundings, keep bed in low position, maintain call bell within reach at all times, provide assistance with transfer out of bed and ambulation.  

## 2018-12-02 NOTE — Progress Notes (Signed)
Patient's Name: Gary [name]  MRN: [id number]  Referring Provider: Kirk Ruths, MD  DOB: [birth year]  PCP: Kirk Ruths, MD  DOS: 12/02/2018  Note by: Gillis Santa, MD  Service setting: Ambulatory outpatient  Specialty: Interventional Pain Management  Patient type: Established  Location: [facility] (AMB) Pain Management Facility  Visit type: Interventional Procedure   Primary Reason for Visit: Interventional Pain Management Treatment. CC: Hip Pain (bilateral) and Back Pain (bilateral )  Procedure:          Anesthesia, Analgesia, Anxiolysis:  Type: Therapeutic Sacroiliac Joint Steroid Injection          Region: Inferior Lumbosacral Region Level: PIIS (Posterior Inferior Iliac Spine) Laterality: Bilateral  Type: Local Anesthesia  Local Anesthetic: Lidocaine 1-2%  Position: Prone           Indications: 1. SI (sacroiliac) joint dysfunction    Pain Score: Pre-procedure: 10-Worst pain ever/10 Post-procedure: 8 /10   Pre-op Assessment:  Gary [name] is a 68 y.o. (year old), male patient, seen today for interventional treatment. He  has a past surgical history that includes Cholecystectomy; Knee surgery (Left); deviated septum repair; Cardiac catheterization; Colonoscopy; Nasal septum surgery; Colonoscopy with propofol (N/A, 01/09/2018); and Esophagogastroduodenoscopy (egd) with propofol (N/A, 01/09/2018). Gary [name] has a current medication list which includes the following prescription(s): acetaminophen, albuterol sulfate, aspirin, buspirone, cholecalciferol, clobetasol ointment, clotrimazole-betamethasone, diclofenac sodium, duloxetine, esomeprazole, ezetimibe, fenofibrate, finasteride, gabapentin, hydrochlorothiazide, mupirocin ointment, niacin-lovastatin, oxycodone-acetaminophen, sildenafil, tamsulosin, atorvastatin, bupropion, esomeprazole, ibuprofen, meloxicam, methocarbamol, and pravastatin, and the following Facility-Administered Medications: iohexol and ropivacaine (pf) 2  mg/ml (0.2%). His primarily concern today is the Hip Pain (bilateral) and Back Pain (bilateral )  Initial Vital Signs:  Pulse/HCG Rate: 95ECG Heart Rate: 87 Temp: 98.5 F (36.9 C) Resp: 16 BP: (!) 120/105 SpO2: 97 %  BMI: Estimated body mass index is 33.6 kg/m as calculated from the following:   Height as of this encounter: 5\' 8"  (1.727 m).   Weight as of this encounter: 221 lb (100.2 kg).  Risk Assessment: Allergies: Reviewed. He is allergic to niacin-lovastatin er; atorvastatin; simvastatin; and propoxyphene.  Allergy Precautions: None required Coagulopathies: Reviewed. None identified.  Blood-thinner therapy: None at this time Active Infection(s): Reviewed. None identified. Gary [name] is afebrile  Site Confirmation: Gary [name] was asked to confirm the procedure and laterality before marking the site Procedure checklist: Completed Consent: Before the procedure and under the influence of no sedative(s), amnesic(s), or anxiolytics, the patient was informed of the treatment options, risks and possible complications. To fulfill our ethical and legal obligations, as recommended by the American Medical Association's Code of Ethics, I have informed the patient of my clinical impression; the nature and purpose of the treatment or procedure; the risks, benefits, and possible complications of the intervention; the alternatives, including doing nothing; the risk(s) and benefit(s) of the alternative treatment(s) or procedure(s); and the risk(s) and benefit(s) of doing nothing. The patient was provided information about the general risks and possible complications associated with the procedure. These may include, but are not limited to: failure to achieve desired goals, infection, bleeding, organ or nerve damage, allergic reactions, paralysis, and death. In addition, the patient was informed of those risks and complications associated to the procedure, such as failure to decrease pain; infection;  bleeding; organ or nerve damage with subsequent damage to sensory, motor, and/or autonomic systems, resulting in permanent pain, numbness, and/or weakness of one or several areas of the body; allergic reactions; (i.e.: anaphylactic reaction); and/or death. Furthermore, the patient  was informed of those risks and complications associated with the medications. These include, but are not limited to: allergic reactions (i.e.: anaphylactic or anaphylactoid reaction(s)); adrenal axis suppression; blood sugar elevation that in diabetics may result in ketoacidosis or comma; water retention that in patients with history of congestive heart failure may result in shortness of breath, pulmonary edema, and decompensation with resultant heart failure; weight gain; swelling or edema; medication-induced neural toxicity; particulate matter embolism and blood vessel occlusion with resultant organ, and/or nervous system infarction; and/or aseptic necrosis of one or more joints. Finally, the patient was informed that Medicine is not an exact science; therefore, there is also the possibility of unforeseen or unpredictable risks and/or possible complications that may result in a catastrophic outcome. The patient indicated having understood very clearly. We have given the patient no guarantees and we have made no promises. Enough time was given to the patient to ask questions, all of which were answered to the patient's satisfaction. Gary [name] has indicated that he wanted to continue with the procedure. Attestation: I, the ordering provider, attest that I have discussed with the patient the benefits, risks, side-effects, alternatives, likelihood of achieving goals, and potential problems during recovery for the procedure that I have provided informed consent. Date  Time: 12/02/2018  9:52 AM  Pre-Procedure Preparation:  Monitoring: As per clinic protocol. Respiration, ETCO2, SpO2, BP, heart rate and rhythm monitor placed and  checked for adequate function Safety Precautions: Patient was assessed for positional comfort and pressure points before starting the procedure. Time-out: I initiated and conducted the "Time-out" before starting the procedure, as per protocol. The patient was asked to participate by confirming the accuracy of the "Time Out" information. Verification of the correct person, site, and procedure were performed and confirmed by me, the nursing staff, and the patient. "Time-out" conducted as per Joint Commission's Universal Protocol (UP.01.01.01). Time: 1027  Description of Procedure:          Target Area: Inferior, posterior, aspect of the sacroiliac fissure Approach: Posterior, paraspinal, ipsilateral approach. Area Prepped: Entire Lower Lumbosacral Region Prepping solution: DuraPrep (Iodine Povacrylex [0.7% available iodine] and Isopropyl Alcohol, 74% w/w) Safety Precautions: Aspiration looking for blood return was conducted prior to all injections. At no point did we inject any substances, as a needle was being advanced. No attempts were made at seeking any paresthesias. Safe injection practices and needle disposal techniques used. Medications properly checked for expiration dates. SDV (single dose vial) medications used. Description of the Procedure: Protocol guidelines were followed. The patient was placed in position over the procedure table. The target area was identified and the area prepped in the usual manner. Skin & deeper tissues infiltrated with local anesthetic. Appropriate amount of time allowed to pass for local anesthetics to take effect. The procedure needle was advanced under fluoroscopic guidance into the sacroiliac joint until a firm endpoint was obtained. Proper needle placement secured. Negative aspiration confirmed. Solution injected in intermittent fashion, asking for systemic symptoms every 0.5cc of injectate. The needles were then removed and the area cleansed, making sure to leave  some of the prepping solution back to take advantage of its long term bactericidal properties. Vitals:   12/02/18 1001 12/02/18 1031 12/02/18 1036 12/02/18 1038  BP: (!) 120/105 121/80 (!) 121/92 129/89  Pulse: 95     Resp: 16 19 14 18   Temp: 98.5 F (36.9 C)     TempSrc: Oral     SpO2: 97% 94% 94% 94%  Weight: 221 lb (100.2 kg)  Height: 5\' 8"  (1.727 m)       Start Time: 1028 hrs. End Time: 1038 hrs. Materials:  Needle(s) Type: Spinal Needle Gauge: 25G Length: 3.5-in Medication(s): Please see orders for medications and dosing details. 5 cc solution made of 4 cc of 0.2% ropivacaine, 1 cc of Decadron 10 mg/cc.  2.5 cc injected intra-articular, 2.5 cc injected periarticular for right SI joint 5 cc solution made of of 4 cc of 0.2% ropivacaine, 1 cc of Decadron 10 mg/cc.  2.5 cc injected intra-articular, 2.5 cc injected periarticular for left   SI joint Imaging Guidance (Non-Spinal):          Type of Imaging Technique: Fluoroscopy Guidance (Non-Spinal) Indication(s): Assistance in needle guidance and placement for procedures requiring needle placement in or near specific anatomical locations not easily accessible without such assistance. Exposure Time: Please see nurses notes. Contrast: Before injecting any contrast, we confirmed that the patient did not have an allergy to iodine, shellfish, or radiological contrast. Once satisfactory needle placement was completed at the desired level, radiological contrast was injected. Contrast injected under live fluoroscopy. No contrast complications. See chart for type and volume of contrast used. Fluoroscopic Guidance: I was personally present during the use of fluoroscopy. "Tunnel Vision Technique" used to obtain the best possible view of the target area. Parallax error corrected before commencing the procedure. "Direction-depth-direction" technique used to introduce the needle under continuous pulsed fluoroscopy. Once target was reached,  antero-posterior, oblique, and lateral fluoroscopic projection used confirm needle placement in all planes. Images permanently stored in EMR. Interpretation: I personally interpreted the imaging intraoperatively. Adequate needle placement confirmed in multiple planes. Appropriate spread of contrast into desired area was observed. No evidence of afferent or efferent intravascular uptake. Permanent images saved into the patient's record.  Antibiotic Prophylaxis:   Anti-infectives (From admission, onward)   None     Indication(s): None identified  Post-operative Assessment:  Post-procedure Vital Signs:  Pulse/HCG Rate: 9584 Temp: 98.5 F (36.9 C) Resp: 18 BP: 129/89 SpO2: 94 %  EBL: None  Complications: No immediate post-treatment complications observed by team, or reported by patient.  Note: The patient tolerated the entire procedure well. A repeat set of vitals were taken after the procedure and the patient was kept under observation following institutional policy, for this type of procedure. Post-procedural neurological assessment was performed, showing return to baseline, prior to discharge. The patient was provided with post-procedure discharge instructions, including a section on how to identify potential problems. Should any problems arise concerning this procedure, the patient was given instructions to immediately contact us, at any time, without hesitation. In any case, we plan to contact the patient by telephone for a follow-up status report regarding this interventional procedure.  Comments:  No additional relevant information.  Plan of Care  Orders:  Orders Placed This Encounter  Procedures  . DG PA[facility] C-ARM 1-60 MIN NO REPORT    Intraoperative interpretation by procedural physician at [facility].    Standing Status:   Standing    Number of Occurrences:   1    Order Specific Question:   Reason for exam:    Answer:   Assistance in needle guidance and  placement for procedures requiring needle placement in or near specific anatomical locations not easily accessible without such assistance.   Medications ordered for procedure: Meds ordered this encounter  Medications  . iohexol (OMNIPAQUE) 180 MG/ML injection 10 mL    Must be Myelogram-compatible. If not available, you may substitute with  a water-soluble, non-ionic, hypoallergenic, myelogram-compatible radiological contrast medium.  Marland Kitchen lidocaine (XYLOCAINE) 2 % (with pres) injection 400 mg  . ropivacaine (PF) 2 mg/mL (0.2%) (NAROPIN) injection 1 mL  . dexamethasone (DECADRON) injection 10 mg  . dexamethasone (DECADRON) injection 10 mg   Medications administered: We administered lidocaine, dexamethasone, and dexamethasone.  See the medical record for exact dosing, route, and time of administration.  Follow-up plan:   Return for Keep sch. appt.      s/p b/l SI-J 12/02/2018   Recent Visits Date Type Provider Dept  11/18/18 Office Visit Gillis Santa, MD [facility]  09/17/18 Office Visit Gillis Santa, MD [facility]  Showing recent visits within past 90 days and meeting all other requirements   Today's Visits Date Type Provider Dept  12/02/18 Procedure visit Gillis Santa, MD [facility]  Showing today's visits and meeting all other requirements   Future Appointments Date Type Provider Dept  12/12/18 Appointment Gillis Santa, MD [facility]  Showing future appointments within next 90 days and meeting all other requirements   Disposition: Discharge home  Discharge Date & Time: 12/02/2018; 1045 hrs.   Primary Care Physician: Kirk Ruths, MD Location: [facility] Note by: Gillis Santa, MD Date: 12/02/2018; Time: 10:45 AM  Disclaimer:  Medicine is not an exact science. The only guarantee in medicine is that nothing is guaranteed. It is important to note that the decision to proceed with this intervention  was based on the information collected from the patient. The Data and conclusions were drawn from the patient's questionnaire, the interview, and the physical examination. Because the information was provided in large part by the patient, it cannot be guaranteed that it has not been purposely or unconsciously manipulated. Every effort has been made to obtain as much relevant data as possible for this evaluation. It is important to note that the conclusions that lead to this procedure are derived in large part from the available data. Always take into account that the treatment will also be dependent on availability of resources and existing treatment guidelines, considered by other Pain Management Practitioners as being common knowledge and practice, at the time of the intervention. For Medico-Legal purposes, it is also important to point out that variation in procedural techniques and pharmacological choices are the acceptable norm. The indications, contraindications, technique, and results of the above procedure should only be interpreted and judged by a Board-Certified Interventional Pain Specialist with extensive familiarity and expertise in the same exact procedure and technique.

## 2018-12-03 ENCOUNTER — Telehealth: Payer: Self-pay

## 2018-12-03 NOTE — Telephone Encounter (Signed)
Post procedure phone call.   No answer.  

## 2018-12-05 ENCOUNTER — Ambulatory Visit
Admission: RE | Admit: 2018-12-05 | Discharge: 2018-12-05 | Disposition: A | Discharge status: Home / Self Care | Payer: PPO | Source: Ambulatory Visit | Attending: Radiation Oncology | Admitting: Radiation Oncology

## 2018-12-05 ENCOUNTER — Encounter: Payer: Self-pay | Admitting: Radiation Oncology

## 2018-12-05 ENCOUNTER — Other Ambulatory Visit: Payer: Self-pay

## 2018-12-05 VITALS — BP 148/99 | HR 83 | Temp 97.3°F | Resp 18 | Wt 217.6 lb

## 2018-12-05 DIAGNOSIS — M25551 Pain in right hip: Secondary | ICD-10-CM | POA: Insufficient documentation

## 2018-12-05 DIAGNOSIS — Z923 Personal history of irradiation: Secondary | ICD-10-CM | POA: Insufficient documentation

## 2018-12-05 DIAGNOSIS — C61 Malignant neoplasm of prostate: Secondary | ICD-10-CM | POA: Diagnosis not present

## 2018-12-05 DIAGNOSIS — M25552 Pain in left hip: Secondary | ICD-10-CM | POA: Diagnosis not present

## 2018-12-05 NOTE — Progress Notes (Signed)
Radiation Oncology Follow up Note  Name: ZAIDEN MCCULLEN   Date:   12/05/2018 MRN:  [id number] DOB: 1950-05-01    This 68 y.o. male presents to the clinic today for 35-month follow-up status post IMRT radiation therapy for stage IIb Gleason 7 (4+3) adenocarcinoma the prostate.  REFERRING PROVIDER: Kirk Ruths, MD  HPI: Patient is a 68 year old male now about 6 months having completed IMRT radiation therapy for stage IIb Gleason 7 adenocarcinoma the prostate.  He is seen today in routine follow-up is doing well he has been having some problems with hip pain bilaterally his received some steroid injections which has improved that.  He specifically denies any increased lower urinary tract symptoms or diarrhea.  His most recent PSA is less than 0.01.Marland Kitchen  COMPLICATIONS OF TREATMENT: none  FOLLOW UP COMPLIANCE: keeps appointments   PHYSICAL EXAM:  BP (!) 148/99 (BP Location: Left Arm, Patient Position: Sitting)   Pulse 83   Temp (!) 97.3 F (36.3 C) (Tympanic)   Resp 18   Wt 217 lb 9.6 oz (98.7 kg)   BMI 33.09 kg/m  Well-developed well-nourished patient in NAD. HEENT reveals PERLA, EOMI, discs not visualized.  Oral cavity is clear. No oral mucosal lesions are identified. Neck is clear without evidence of cervical or supraclavicular adenopathy. Lungs are clear to A&P. Cardiac examination is essentially unremarkable with regular rate and rhythm without murmur rub or thrill. Abdomen is benign with no organomegaly or masses noted. Motor sensory and DTR levels are equal and symmetric in the upper and lower extremities. Cranial nerves II through XII are grossly intact. Proprioception is intact. No peripheral adenopathy or edema is identified. No motor or sensory levels are noted. Crude visual fields are within normal range.  RADIOLOGY RESULTS: No current films to review  PLAN: Present time patient is doing well with no evidence of disease under excellent biochemical control of his prostate  cancer.  I am pleased with his overall progress.  Of asked him back in 6 months for follow-up with a PSA prior.  We will then start once year follow-up visits.  Patient knows to call with any concerns.  I would like to take this opportunity to thank you for allowing me to participate in the care of your patient.Noreene Filbert, MD

## 2018-12-11 ENCOUNTER — Encounter: Payer: Self-pay | Admitting: Student in an Organized Health Care Education/Training Program

## 2018-12-12 ENCOUNTER — Ambulatory Visit
Payer: PPO | Attending: Student in an Organized Health Care Education/Training Program | Admitting: Student in an Organized Health Care Education/Training Program

## 2018-12-12 ENCOUNTER — Other Ambulatory Visit: Payer: Self-pay

## 2018-12-12 ENCOUNTER — Encounter: Payer: Self-pay | Admitting: Student in an Organized Health Care Education/Training Program

## 2018-12-12 DIAGNOSIS — G8929 Other chronic pain: Secondary | ICD-10-CM | POA: Diagnosis not present

## 2018-12-12 DIAGNOSIS — M25512 Pain in left shoulder: Secondary | ICD-10-CM

## 2018-12-12 DIAGNOSIS — M47816 Spondylosis without myelopathy or radiculopathy, lumbar region: Secondary | ICD-10-CM

## 2018-12-12 DIAGNOSIS — M79605 Pain in left leg: Secondary | ICD-10-CM

## 2018-12-12 DIAGNOSIS — M79604 Pain in right leg: Secondary | ICD-10-CM | POA: Diagnosis not present

## 2018-12-12 DIAGNOSIS — Z79891 Long term (current) use of opiate analgesic: Secondary | ICD-10-CM | POA: Diagnosis not present

## 2018-12-12 DIAGNOSIS — M533 Sacrococcygeal disorders, not elsewhere classified: Secondary | ICD-10-CM

## 2018-12-12 DIAGNOSIS — M5136 Other intervertebral disc degeneration, lumbar region: Secondary | ICD-10-CM

## 2018-12-12 DIAGNOSIS — G894 Chronic pain syndrome: Secondary | ICD-10-CM

## 2018-12-12 MED ORDER — OXYCODONE-ACETAMINOPHEN 10-325 MG PO TABS: 1.0000 | ORAL_TABLET | Freq: Three times a day (TID) | ORAL | 0 refills | Status: AC | PRN

## 2018-12-12 MED ORDER — OXYCODONE-ACETAMINOPHEN 10-325 MG PO TABS: 1.0000 | ORAL_TABLET | Freq: Three times a day (TID) | ORAL | 0 refills | Status: DC | PRN

## 2018-12-12 NOTE — Progress Notes (Signed)
Pain Management Virtual Encounter Note - Virtual Visit via [place] (real-time audio visits between healthcare provider and patient).   Patient's Phone No. & Preferred Pharmacy:  [phone] (home); [phone] (mobile); (Preferred) [phone] No e-mail address on record  [place], [place] - [address] [place] [address] Phone: [phone] Fax: [phone]    Pre-screening note:  Our staff contacted Mr. Mandella and offered him an "in person", "face-to-face" appointment versus a telephone encounter. He indicated preferring the telephone encounter, at this time.   Reason for Virtual Visit: COVID-19*  Social distancing based on CDC and AMA recommendations.   I contacted Charlean Merl on 12/12/2018 via video conference.      I clearly identified myself as Gillis Santa, MD. I verified that I was speaking with the correct person using two identifiers (Name: HALSTON PARRO, and date of birth: [birth year]).  Advanced Informed Consent I sought verbal advanced consent from Charlean Merl for virtual visit interactions. I informed Mr. Alberding of possible security and privacy concerns, risks, and limitations associated with providing "not-in-person" medical evaluation and management services. I also informed Mr. Bumgardner of the availability of "in-person" appointments. Finally, I informed him that there would be a charge for the virtual visit and that he could be  personally, fully or partially, financially responsible for it. Mr. Slater expressed understanding and agreed to proceed.   Historic Elements   Mr. COLLIER KIRCHBERG is a 68 y.o. year old, male patient evaluated today after his last encounter by our practice on 12/03/2018. Mr. Sproles  has a past medical history of Anxiety, Benign essential HTN (06/24/2015), BPH (benign prostatic hyperplasia), COPD (chronic obstructive pulmonary disease) ([place]), Coronary artery disease  (02/16/2014), DDD (degenerative disc disease), cervical, Degenerative disc disease, cervical (01/22/2017), Depression, DJD (degenerative joint disease), Emphysema of lung ([place]), Heel pain (10/10/2017), Hyperglobulinemia, Lumbar degenerative disc disease (01/22/2017), Major depression in remission ([place]) (03/10/2014), Migraines, Mitral regurgitation, Mixed hyperlipidemia (02/16/2014), Neck pain (01/24/2017), OSA (obstructive sleep apnea) (07/05/2016), Osteoarthritis of knee (08/12/2014), RA (rheumatoid arthritis) ([place]), Spinal stenosis of lumbar region with neurogenic claudication (04/18/2016), Spondylosis without myelopathy or radiculopathy, lumbar region (01/22/2017), and Venous insufficiency of both lower extremities (11/07/2016). He also  has a past surgical history that includes Cholecystectomy; Knee surgery (Left); deviated septum repair; Cardiac catheterization; Colonoscopy; Nasal septum surgery; Colonoscopy with propofol (N/A, 01/09/2018); and Esophagogastroduodenoscopy (egd) with propofol (N/A, 01/09/2018). Mr. Scholes has a current medication list which includes the following prescription(s): acetaminophen, albuterol sulfate, aspirin, atorvastatin, bupropion, buspirone, cholecalciferol, clobetasol ointment, clotrimazole-betamethasone, diclofenac sodium, duloxetine, esomeprazole, esomeprazole, ezetimibe, fenofibrate, finasteride, gabapentin, hydrochlorothiazide, ibuprofen, meloxicam, methocarbamol, mupirocin ointment, niacin-lovastatin, oxycodone-acetaminophen, oxycodone-acetaminophen, oxycodone-acetaminophen, pravastatin, sildenafil, and tamsulosin. He  reports that he quit smoking about 68 years ago. His smoking use included cigarettes. He has a 67.50 pack-year smoking history. He has never used smokeless tobacco. He reports that he does not drink alcohol or use drugs. Mr. Olshefski is allergic to niacin-lovastatin er; atorvastatin; simvastatin; and propoxyphene.   HPI  Today, he is being contacted for both, medication  management and a post-procedure assessment.  Virtual visit today for medication management as well as postprocedural evaluation.  Unfortunately patient did not obtain long-term pain relief with the SI joint injection.  He states that for the first 5 days he had a 50% reduction in his lower buttock pain but thereafter it has gradually returned.  Patient also endorses pain in his bilateral legs which is worse with activity.  He  does have a history of coronary artery disease.  Differential of lower extremity leg pain also includes vascular claudication in the context of his risk factors which include CAD, diabetes, hypertension.  Will place referral for vascular surgery to eval lower extremity pain which could be related to vascular claudication.  In regards to medication management, urine toxicology screen is up-to-date.  Medications help decrease his pain and improve his functional status.  No compliance issues.  We will refill as below.  Pharmacotherapy Assessment  Analgesic: 11/22/2018  2   09/17/2018  Oxycodone-Acetaminophen 10-325  90.00 30 Bi Lat   [id number]   Thr (4878)   0  45.00 MME  Medicare   [place]    Monitoring: Pharmacotherapy: No side-effects or adverse reactions reported. [place] PMP: PDMP reviewed during this encounter.       Compliance: No problems identified. Effectiveness: Clinically acceptable. Plan: Refer to "POC".  UDS:  Summary  Date Value Ref Range Status  05/28/2018 FINAL  Final    Comment:    ==================================================================== TOXASSURE SELECT 13 (MW) ==================================================================== Test                             Result       Flag       Units Drug Present   Oxycodone                      413                     ng/mg creat   Oxymorphone                    349                     ng/mg creat   Noroxycodone                   584                     ng/mg creat   Noroxymorphone                 158                      ng/mg creat    Sources of oxycodone are scheduled prescription medications.    Oxymorphone, noroxycodone, and noroxymorphone are expected    metabolites of oxycodone. Oxymorphone is also available as a    scheduled prescription medication. ==================================================================== Test                      Result    Flag   Units      Ref Range   Creatinine              190              mg/dL      >=20 ==================================================================== Declared Medications:  Medication list was not provided. ==================================================================== For clinical consultation, please call [phone]. ====================================================================     Assessment  The primary encounter diagnosis was Bilateral leg pain. Diagnoses of SI (sacroiliac) joint dysfunction, Chronic pain syndrome, Chronic left shoulder pain, Spondylosis without myelopathy or radiculopathy, lumbar region, Lumbar spondylosis, Long term prescription opiate use, Lumbar degenerative disc disease, and Facet arthropathy, lumbar were also pertinent to this visit.  Plan of Care  I have changed  Doroteo Bradford. Lauf's oxyCODONE-acetaminophen. I am also having him start on oxyCODONE-acetaminophen and oxyCODONE-acetaminophen. Additionally, I am having him maintain his acetaminophen, Albuterol Sulfate, cholecalciferol, fenofibrate, finasteride, niacin-lovastatin, pravastatin, aspirin, buPROPion, esomeprazole, hydrochlorothiazide, ezetimibe, sildenafil, methocarbamol, meloxicam, clobetasol ointment, clotrimazole-betamethasone, diclofenac sodium, ibuprofen, mupirocin ointment, DULoxetine, gabapentin, busPIRone, esomeprazole, tamsulosin, and atorvastatin.  Continue multimodal analgesics with acetaminophen 500 mg as needed, Cymbalta 60 mg daily, ibuprofen 2 to 400 mg 3 times daily as needed, Robaxin 500 mg twice daily as needed.   Unfortunately SI joint injection was not helpful.  Patient is also tried lumbar epidural steroid injections which previously provided pain relief for 3 to 4 weeks but given worsening lumbar spine degeneration, have become less effective.  Given persistent leg pain and history of CAD, type 2 diabetes, hypertension, will refer to vascular surgery to eval for vascular claudication and consider ABI.  Follow back in 3 months.  Pharmacotherapy (Medications Ordered): Meds ordered this encounter  Medications  . oxyCODONE-acetaminophen (PERCOCET) 10-325 MG tablet    Sig: Take 1 tablet by mouth every 8 (eight) hours as needed for pain. Must last 30 days.    Dispense:  90 tablet    Refill:  0    [place] STOP ACT - Not applicable. Fill one day early if pharmacy is closed on scheduled refill date.  Marland Kitchen oxyCODONE-acetaminophen (PERCOCET) 10-325 MG tablet    Sig: Take 1 tablet by mouth every 8 (eight) hours as needed for pain. Must last 30 days.    Dispense:  90 tablet    Refill:  0    [place] STOP ACT - Not applicable. Fill one day early if pharmacy is closed on scheduled refill date.  Marland Kitchen oxyCODONE-acetaminophen (PERCOCET) 10-325 MG tablet    Sig: Take 1 tablet by mouth every 8 (eight) hours as needed for pain. Must last 30 days.    Dispense:  90 tablet    Refill:  0    [place] STOP ACT - Not applicable. Fill one day early if pharmacy is closed on scheduled refill date.   Orders:  Orders Placed This Encounter  Procedures  . Ambulatory referral to Vascular Surgery    Referral Priority:   Routine    Referral Type:   Surgical    Referral Reason:   Specialty Services Required    Requested Specialty:   Vascular Surgery    Number of Visits Requested:   1   Follow-up plan:   No follow-ups on file.     s/p b/l SI-J 12/02/2018- not helpful    Recent Visits Date Type Provider Dept  12/02/18 Procedure visit Gillis Santa, MD [facility]  11/18/18 Office Visit Gillis Santa, MD [facility]  09/17/18  Office Visit Gillis Santa, MD [facility]  Showing recent visits within past 90 days and meeting all other requirements   Today's Visits Date Type Provider Dept  12/12/18 Office Visit Gillis Santa, MD [facility]  Showing today's visits and meeting all other requirements   Future Appointments No visits were found meeting these conditions.  Showing future appointments within next 90 days and meeting all other requirements   I discussed the assessment and treatment plan with the patient. The patient was provided an opportunity to ask questions and all were answered. The patient agreed with the plan and demonstrated an understanding of the instructions.  Patient advised to call back or seek an in-person evaluation if the symptoms or condition worsens.  Total duration of non-face-to-face encounter:43minutes.  Note by: Carlus Pavlov  Holley Raring, MD Date: 12/12/2018; Time: 9:35 AM  Note: This dictation was prepared with Dragon dictation. Any transcriptional errors that may result from this process are unintentional.  Disclaimer:  * Given the special circumstances of the COVID-19 pandemic, the federal government has announced that the Office for Civil Rights (OCR) will exercise its enforcement discretion and will not impose penalties on physicians using telehealth in the event of noncompliance with regulatory requirements under the [place] and [place] (HIPAA) in connection with the good faith provision of telehealth during the XX123456 national public health emergency. ([place])

## 2019-01-24 ENCOUNTER — Other Ambulatory Visit: Payer: Self-pay

## 2019-01-24 DIAGNOSIS — M79605 Pain in left leg: Secondary | ICD-10-CM

## 2019-01-24 DIAGNOSIS — M79604 Pain in right leg: Secondary | ICD-10-CM

## 2019-01-27 ENCOUNTER — Telehealth (HOSPITAL_COMMUNITY): Payer: Self-pay

## 2019-01-27 NOTE — Telephone Encounter (Signed)

## 2019-01-28 ENCOUNTER — Encounter: Payer: PPO | Admitting: Vascular Surgery

## 2019-01-28 ENCOUNTER — Ambulatory Visit (HOSPITAL_COMMUNITY): Payer: PPO | Attending: Internal Medicine

## 2019-02-24 DIAGNOSIS — E119 Type 2 diabetes mellitus without complications: Secondary | ICD-10-CM | POA: Diagnosis not present

## 2019-02-24 DIAGNOSIS — H52223 Regular astigmatism, bilateral: Secondary | ICD-10-CM | POA: Diagnosis not present

## 2019-02-24 DIAGNOSIS — H2513 Age-related nuclear cataract, bilateral: Secondary | ICD-10-CM | POA: Diagnosis not present

## 2019-02-24 DIAGNOSIS — Z7984 Long term (current) use of oral hypoglycemic drugs: Secondary | ICD-10-CM | POA: Diagnosis not present

## 2019-02-24 DIAGNOSIS — H40033 Anatomical narrow angle, bilateral: Secondary | ICD-10-CM | POA: Diagnosis not present

## 2019-02-24 DIAGNOSIS — H524 Presbyopia: Secondary | ICD-10-CM | POA: Diagnosis not present

## 2019-02-24 DIAGNOSIS — H5203 Hypermetropia, bilateral: Secondary | ICD-10-CM | POA: Diagnosis not present

## 2019-02-26 ENCOUNTER — Telehealth: Payer: Self-pay | Admitting: Urology

## 2019-02-26 NOTE — Telephone Encounter (Signed)
NO PA REQUIRED Lupron WITH [employer] 02-26-19 MICHELLE

## 2019-03-12 ENCOUNTER — Encounter: Payer: Self-pay | Admitting: Student in an Organized Health Care Education/Training Program

## 2019-03-12 ENCOUNTER — Telehealth: Payer: Self-pay

## 2019-03-12 NOTE — Telephone Encounter (Signed)
He is returning your call regarding appt tomorrow. Please call back

## 2019-03-13 ENCOUNTER — Ambulatory Visit
Payer: PPO | Attending: Student in an Organized Health Care Education/Training Program | Admitting: Student in an Organized Health Care Education/Training Program

## 2019-03-13 ENCOUNTER — Other Ambulatory Visit: Payer: Self-pay

## 2019-03-13 ENCOUNTER — Encounter: Payer: Self-pay | Admitting: Student in an Organized Health Care Education/Training Program

## 2019-03-13 DIAGNOSIS — G8929 Other chronic pain: Secondary | ICD-10-CM | POA: Diagnosis not present

## 2019-03-13 DIAGNOSIS — M47816 Spondylosis without myelopathy or radiculopathy, lumbar region: Secondary | ICD-10-CM

## 2019-03-13 DIAGNOSIS — G894 Chronic pain syndrome: Secondary | ICD-10-CM

## 2019-03-13 DIAGNOSIS — M47818 Spondylosis without myelopathy or radiculopathy, sacral and sacrococcygeal region: Secondary | ICD-10-CM

## 2019-03-13 DIAGNOSIS — M79604 Pain in right leg: Secondary | ICD-10-CM

## 2019-03-13 DIAGNOSIS — M79605 Pain in left leg: Secondary | ICD-10-CM

## 2019-03-13 DIAGNOSIS — M533 Sacrococcygeal disorders, not elsewhere classified: Secondary | ICD-10-CM | POA: Diagnosis not present

## 2019-03-13 DIAGNOSIS — M25512 Pain in left shoulder: Secondary | ICD-10-CM

## 2019-03-13 MED ORDER — OXYCODONE-ACETAMINOPHEN 10-325 MG PO TABS: 1.0000 | ORAL_TABLET | Freq: Three times a day (TID) | ORAL | 0 refills | Status: AC | PRN

## 2019-03-13 MED ORDER — OXYCODONE-ACETAMINOPHEN 10-325 MG PO TABS: 1.0000 | ORAL_TABLET | Freq: Three times a day (TID) | ORAL | 0 refills | Status: DC | PRN

## 2019-03-13 NOTE — Progress Notes (Signed)
Pain Management Virtual Encounter Note - Virtual Visit via [place] (real-time audio visits between healthcare provider and patient).   Patient's Phone No. & Preferred Pharmacy:  [phone] (home); [phone] (mobile); (Preferred) [phone] No e-mail address on record  [place], [place] - [address] [place] [address] Phone: [phone] Fax: [phone]    Pre-screening note:  Our staff contacted Gary [name] and offered him an "in person", "face-to-face" appointment versus a telephone encounter. He indicated preferring the telephone encounter, at this time.   Reason for Virtual Visit: COVID-19*  Social distancing based on CDC and AMA recommendations.   I contacted Gary [name] on 03/13/2019 via video conference.      I clearly identified myself as Gillis Santa, MD. I verified that I was speaking with the correct person using two identifiers (Name: Gary [name], and date of birth: 02-Apr-1951).  Advanced Informed Consent I sought verbal advanced consent from Gary [name] for virtual visit interactions. I informed Gary [name] of possible security and privacy concerns, risks, and limitations associated with providing "not-in-person" medical evaluation and management services. I also informed Gary [name] of the availability of "in-person" appointments. Finally, I informed him that there would be a charge for the virtual visit and that he could be  personally, fully or partially, financially responsible for it. Gary [name] expressed understanding and agreed to proceed.   Historic Elements   Gary [name] is a 68 y.o. year old, male patient evaluated today after his last encounter by our practice on 03/12/2019. Gary [name]  has a past medical history of Anxiety, Benign essential HTN (06/24/2015), BPH (benign prostatic hyperplasia), COPD (chronic obstructive pulmonary disease) ([place]), Coronary artery disease  (02/16/2014), DDD (degenerative disc disease), cervical, Degenerative disc disease, cervical (01/22/2017), Depression, DJD (degenerative joint disease), Emphysema of lung ([place]), Heel pain (10/10/2017), Hyperglobulinemia, Lumbar degenerative disc disease (01/22/2017), Major depression in remission ([place]) (03/10/2014), Migraines, Mitral regurgitation, Mixed hyperlipidemia (02/16/2014), Neck pain (01/24/2017), OSA (obstructive sleep apnea) (07/05/2016), Osteoarthritis of knee (08/12/2014), RA (rheumatoid arthritis) ([place]), Spinal stenosis of lumbar region with neurogenic claudication (04/18/2016), Spondylosis without myelopathy or radiculopathy, lumbar region (01/22/2017), and Venous insufficiency of both lower extremities (11/07/2016). He also  has a past surgical history that includes Cholecystectomy; Knee surgery (Left); deviated septum repair; Cardiac catheterization; Colonoscopy; Nasal septum surgery; Colonoscopy with propofol (N/A, 01/09/2018); and Esophagogastroduodenoscopy (egd) with propofol (N/A, 01/09/2018). Gary [name] has a current medication list which includes the following prescription(s): acetaminophen, albuterol sulfate, aspirin, atorvastatin, bupropion, buspirone, cholecalciferol, clobetasol ointment, clotrimazole-betamethasone, diclofenac sodium, duloxetine, esomeprazole, ezetimibe, fenofibrate, finasteride, gabapentin, hydrochlorothiazide, ibuprofen, meloxicam, methocarbamol, mupirocin ointment, niacin-lovastatin, oxycodone-acetaminophen, oxycodone-acetaminophen, oxycodone-acetaminophen, pravastatin, sildenafil, tamsulosin, and esomeprazole. He  reports that he quit smoking about 6 years ago. His smoking use included cigarettes. He has a 67.50 pack-year smoking history. He has never used smokeless tobacco. He reports that he does not drink alcohol or use drugs. Gary [name] is allergic to niacin-lovastatin er; atorvastatin; simvastatin; and propoxyphene.   HPI  Today, he is being contacted for medication  management. and worsening of previous established problem.  Patient is endorsing patient is endorsing worsening low back pain that radiates into bilateral hips and posterior lateral right thigh.  He describes the pain as aching and throbbing.  It is worse with walking and with lumbar extension.  He denies any bowel or bladder dysfunction.  His previous SI joint injection was not as effective as we had hoped.  Upon reviewing his  previous lumbar MRI studies, patient does demonstrate significant facet arthropathy at L3-L4 L4-L5 and L5-S1.  We discussed diagnostic lumbar facet medial branch nerve blocks and possible radiofrequency ablation thereafter.  Patient is interested in pursuing these.  Otherwise he is doing well with his medications.  No side effects.  UDS up-to-date and appropriate.  We will refill as below.  Pharmacotherapy Assessment  Analgesic:  02/20/2019  1   12/12/2018  Oxycodone-Acetaminophen 10-325  90.00  30 Bi Lat   [id number]   Thr (4878)   0  45.00 MME  Medicare   [place]     Monitoring: Pharmacotherapy: No side-effects or adverse reactions reported. [place] PMP: PDMP reviewed during this encounter.       Compliance: No problems identified. Effectiveness: Clinically acceptable. Plan: Refer to "POC".  UDS:  Summary  Date Value Ref Range Status  05/28/2018 FINAL  Final    Comment:    ==================================================================== TOXASSURE SELECT 13 (MW) ==================================================================== Test                             Result       Flag       Units Drug Present   Oxycodone                      413                     ng/mg creat   Oxymorphone                    349                     ng/mg creat   Noroxycodone                   584                     ng/mg creat   Noroxymorphone                 158                     ng/mg creat    Sources of oxycodone are scheduled prescription medications.    Oxymorphone,  noroxycodone, and noroxymorphone are expected    metabolites of oxycodone. Oxymorphone is also available as a    scheduled prescription medication. ==================================================================== Test                      Result    Flag   Units      Ref Range   Creatinine              190              mg/dL      >=20 ==================================================================== Declared Medications:  Medication list was not provided. ==================================================================== For clinical consultation, please call [phone]. ====================================================================    Laboratory Chemistry Profile (12 mo)  Renal: No results found for requested labs within last 8760 hours.  No results found for: GFR, GFRAA, GFRNONAA Hepatic: No results found for requested labs within last 8760 hours. No results found for: AST, ALT Other: No results found for requested labs within last 8760 hours. Note: Above Lab results reviewed.  Imaging  DG PAIN CLINIC C-ARM 1-60 MIN NO REPORT Fluoro was used, but no Radiologist interpretation will be  provided.  Please refer to "NOTES" tab for provider progress note.   Assessment  The primary encounter diagnosis was Facet arthropathy, lumbar. Diagnoses of Lumbar spondylosis, Spondylosis without myelopathy or radiculopathy, lumbar region, SI (sacroiliac) joint dysfunction, Bilateral leg pain, Chronic left shoulder pain, SI joint arthritis, Chronic right SI joint pain, and Chronic pain syndrome were also pertinent to this visit.  Plan of Care  Problem-specific:  Facet arthropathy, lumbar Orders Placed This Encounter  Procedures  . L-FCT Blk (Schedule)    Standing Status:   Future    Standing Expiration Date:   04/13/2019    Scheduling Instructions:     Side: Bilateral     Level: L3-4, L4-5, & L5-S1 Facets (L3, L4, L5, & S1 Medial Branch Nerves)     Sedation: with      Timeframe: ASAA    Order Specific Question:   Where will this procedure be performed?    Answer:   [facility]     Gary [name] has a history of greater than 3 months of moderate to severe pain which is resulted in functional impairment.  The patient has tried various conservative therapeutic options such as NSAIDs, Tylenol, muscle relaxants, physical therapy which was inadequately effective.  Patient's pain is predominantly axial with MRI findings showing  facet arthropathy.  Lumbar facet medial branch nerve blocks were discussed with the patient.  Risks and benefits were reviewed.  Patient would like to proceed with bilateral L3, L4, L5. S1 medial branch nerve block.  I am having Gary [name] start on oxyCODONE-acetaminophen and oxyCODONE-acetaminophen. I am also having him maintain his acetaminophen, Albuterol Sulfate, cholecalciferol, fenofibrate, finasteride, niacin-lovastatin, pravastatin, aspirin, buPROPion, esomeprazole, hydrochlorothiazide, ezetimibe, sildenafil, methocarbamol, meloxicam, clobetasol ointment, clotrimazole-betamethasone, diclofenac sodium, ibuprofen, mupirocin ointment, DULoxetine, gabapentin, busPIRone, esomeprazole, tamsulosin, atorvastatin, and oxyCODONE-acetaminophen.  Pharmacotherapy (Medications Ordered): Meds ordered this encounter  Medications  . oxyCODONE-acetaminophen (PERCOCET) 10-325 MG tablet    Sig: Take 1 tablet by mouth every 8 (eight) hours as needed for pain. Must last 30 days.    Dispense:  90 tablet    Refill:  0    [place] STOP ACT - Not applicable. Fill one day early if pharmacy is closed on scheduled refill date.  Marland Kitchen oxyCODONE-acetaminophen (PERCOCET) 10-325 MG tablet    Sig: Take 1 tablet by mouth every 8 (eight) hours as needed for pain. Must last 30 days.    Dispense:  90 tablet    Refill:  0    [place] STOP ACT - Not applicable. Fill one day early if pharmacy is closed on scheduled refill date.  Marland Kitchen oxyCODONE-acetaminophen (PERCOCET)  10-325 MG tablet    Sig: Take 1 tablet by mouth every 8 (eight) hours as needed for pain. Must last 30 days.    Dispense:  90 tablet    Refill:  0    [place] STOP ACT - Not applicable. Fill one day early if pharmacy is closed on scheduled refill date.   Orders:  Orders Placed This Encounter  Procedures  . L-FCT Blk (Schedule)    Standing Status:   Future    Standing Expiration Date:   04/13/2019    Scheduling Instructions:     Side: Bilateral     Level: L3-4, L4-5, & L5-S1 Facets (L3, L4, L5, & S1 Medial Branch Nerves)     Sedation: with     Timeframe: ASAA    Order Specific Question:   Where will this procedure be performed?    Answer:  [facility]   Follow-up plan:   Return in about 2 weeks (around 03/27/2019) for Procedure B/L L3-S1 fcts, with sedation.     s/p b/l SI-J 12/02/2018- not helpful, plan for lumbar facets L3-S1 b/l     Recent Visits No visits were found meeting these conditions.  Showing recent visits within past 90 days and meeting all other requirements   Today's Visits Date Type Provider Dept  03/13/19 Office Visit Gillis Santa, MD [facility]  Showing today's visits and meeting all other requirements   Future Appointments No visits were found meeting these conditions.  Showing future appointments within next 90 days and meeting all other requirements   I discussed the assessment and treatment plan with the patient. The patient was provided an opportunity to ask questions and all were answered. The patient agreed with the plan and demonstrated an understanding of the instructions.  Patient advised to call back or seek an in-person evaluation if the symptoms or condition worsens.  Total duration of non-face-to-face encounter: 25 minutes.  Note by: Gillis Santa, MD Date: 03/13/2019; Time: 8:36 AM  Note: This dictation was prepared with Dragon dictation. Any transcriptional errors that may result from this process are  unintentional.  Disclaimer:  * Given the special circumstances of the COVID-19 pandemic, the federal government has announced that the Office for Civil Rights (OCR) will exercise its enforcement discretion and will not impose penalties on physicians using telehealth in the event of noncompliance with regulatory requirements under the [place] and [place] (HIPAA) in connection with the good faith provision of telehealth during the XX123456 national public health emergency. ([place])

## 2019-03-13 NOTE — Assessment & Plan Note (Signed)
Orders Placed This Encounter  Procedures  . L-FCT Blk (Schedule)    Standing Status:   Future    Standing Expiration Date:   04/13/2019    Scheduling Instructions:     Side: Bilateral     Level: L3-4, L4-5, & L5-S1 Facets (L3, L4, L5, & S1 Medial Branch Nerves)     Sedation: with     Timeframe: ASAA    Order Specific Question:   Where will this procedure be performed?    Answer:   [facility]

## 2019-03-17 ENCOUNTER — Telehealth: Payer: Self-pay | Admitting: Urology

## 2019-03-17 NOTE — Telephone Encounter (Signed)
Pt called and would like a call back to discuss side effects from Lupron. He states that when he gets out of the shower, he sweats really bad.

## 2019-03-17 NOTE — Telephone Encounter (Signed)
Spoke to patient and informed him that hot flashes are a common side effect with the Lupron injection. Patient voiced understanding.

## 2019-03-25 ENCOUNTER — Encounter: Payer: PPO | Admitting: Vascular Surgery

## 2019-03-25 ENCOUNTER — Encounter (HOSPITAL_COMMUNITY): Payer: PPO

## 2019-03-31 ENCOUNTER — Ambulatory Visit: Payer: PPO | Admitting: Student in an Organized Health Care Education/Training Program

## 2019-04-15 DIAGNOSIS — Z20828 Contact with and (suspected) exposure to other viral communicable diseases: Secondary | ICD-10-CM | POA: Diagnosis not present

## 2019-04-16 ENCOUNTER — Ambulatory Visit: Payer: PPO | Admitting: Student in an Organized Health Care Education/Training Program

## 2019-04-25 DIAGNOSIS — I2510 Atherosclerotic heart disease of native coronary artery without angina pectoris: Secondary | ICD-10-CM | POA: Diagnosis not present

## 2019-04-25 DIAGNOSIS — R0789 Other chest pain: Secondary | ICD-10-CM | POA: Diagnosis not present

## 2019-04-25 DIAGNOSIS — I10 Essential (primary) hypertension: Secondary | ICD-10-CM | POA: Diagnosis not present

## 2019-04-25 DIAGNOSIS — E118 Type 2 diabetes mellitus with unspecified complications: Secondary | ICD-10-CM | POA: Diagnosis not present

## 2019-04-25 DIAGNOSIS — E782 Mixed hyperlipidemia: Secondary | ICD-10-CM | POA: Diagnosis not present

## 2019-04-25 DIAGNOSIS — G4733 Obstructive sleep apnea (adult) (pediatric): Secondary | ICD-10-CM | POA: Diagnosis not present

## 2019-05-05 ENCOUNTER — Encounter: Payer: Self-pay | Admitting: Urology

## 2019-05-05 ENCOUNTER — Other Ambulatory Visit: Payer: Self-pay

## 2019-05-05 ENCOUNTER — Ambulatory Visit: Payer: PPO | Admitting: Urology

## 2019-05-05 VITALS — BP 129/84 | HR 92 | Ht 68.0 in | Wt 220.0 lb

## 2019-05-05 DIAGNOSIS — B356 Tinea cruris: Secondary | ICD-10-CM | POA: Diagnosis not present

## 2019-05-05 DIAGNOSIS — C61 Malignant neoplasm of prostate: Secondary | ICD-10-CM | POA: Diagnosis not present

## 2019-05-05 MED ORDER — LEUPROLIDE ACETATE (6 MONTH) 45 MG ~~LOC~~ KIT: 45.0000 mg | PACK | Freq: Once | SUBCUTANEOUS | Status: AC

## 2019-05-05 NOTE — Progress Notes (Signed)
05/05/2019 1:01 PM   Gary [name] January 21, 1951 [id number]  Referring provider: Kirk Ruths, MD [facility] [place],  [place] [address]  Chief Complaint  Patient presents with  . Prostate Cancer    HPI: 69 y.o. male with T2b intermediate risk (unfavorable) adenocarcinoma prostate treated with radiation + ADT.  He is due for a leuprolide injection today however wanted to be seen for a groin rash.  He presents with a 7-10-day history of a bilateral groin rash left >right.  He took some of his wife's Diflucan which did not help.  He is also placed triple antibiotic ointment on the area.   PMH: Past Medical History:  Diagnosis Date  . Anxiety   . Benign essential HTN 06/24/2015   Last Assessment & Plan:  Taking medications without noted side effects or dizziness.   Overview:  Last Assessment & Plan:  Is compliant with hypertensive medications without clear side effects or lack of control.    Marland Kitchen BPH (benign prostatic hyperplasia)   . COPD (chronic obstructive pulmonary disease) ([place])   . Coronary artery disease 02/16/2014   Overview:  Minimal disease by cath 12/13  Last Assessment & Plan:  Seems to be tolerating medical regimen without significant side effects and symptoms such as worsening chest pain are not noted.   Overview:  Overview:  Minimal disease by cath 12/13  Last Assessment & Plan:  Seems to be tolerating medical regimen without significant side effects and symptoms such as worsening chest pain are not no  . DDD (degenerative disc disease), cervical   . Degenerative disc disease, cervical 01/22/2017  . Depression   . DJD (degenerative joint disease)   . Emphysema of lung ([place])   . Heel pain 10/10/2017  . Hyperglobulinemia   . Lumbar degenerative disc disease 01/22/2017  . Major depression in remission ([place]) 03/10/2014   Last Assessment & Plan:  Mood is doing well on meds Overview:  Last Assessment & Plan:  Mood is doing well on meds    . Migraines   . Mitral regurgitation   . Mixed hyperlipidemia 02/16/2014   Last Assessment & Plan:  Diet for healthy cholesterol is being attempted and no clear myalgia's or other side effects are noted.   Overview:  Last Assessment & Plan:  Low fat diet is being attempted and no significant side effects such as myalgia's are noted.    . Neck pain 01/24/2017  . OSA (obstructive sleep apnea) 07/05/2016   Last Assessment & Plan:  Continues to use cpap consistently and is continuing to benefit from it's use.    . Osteoarthritis of knee 08/12/2014   Overview:  Right knee is worse now  Last Assessment & Plan:  Diffuse pain on back in hips and knees also. Went to a pain clinic  Overview:  Overview:  Right knee is worse now  Last Assessment & Plan:  Diffuse pain on back in hips and knees also. Went to a pain clinic   . RA (rheumatoid arthritis) ([place])   . Spinal stenosis of lumbar region with neurogenic claudication 04/18/2016  . Spondylosis without myelopathy or radiculopathy, lumbar region 01/22/2017  . Venous insufficiency of both lower extremities 11/07/2016    Surgical History: Past Surgical History:  Procedure Laterality Date  . CARDIAC CATHETERIZATION    . CHOLECYSTECTOMY    . COLONOSCOPY    . COLONOSCOPY WITH PROPOFOL N/A 01/09/2018   Procedure: COLONOSCOPY WITH PROPOFOL;  Surgeon: [place], [place]  K, MD;  Location: [facility];  Service: Gastroenterology;  Laterality: N/A;  . deviated septum repair    . ESOPHAGOGASTRODUODENOSCOPY (EGD) WITH PROPOFOL N/A 01/09/2018   Procedure: ESOPHAGOGASTRODUODENOSCOPY (EGD) WITH PROPOFOL;  Surgeon: [place], Benay Pike, MD;  Location: [facility];  Service: Gastroenterology;  Laterality: N/A;  . KNEE SURGERY Left   . NASAL SEPTUM SURGERY      Home Medications:  Allergies as of 05/05/2019      Reactions   Niacin-lovastatin Er Other (See Comments)   unknown unknown   Atorvastatin Other (See Comments)   Simvastatin Other (See Comments)   Propoxyphene  Nausea Only   Passed out after taking it on an empty stomach      Medication List       Accurate as of May 05, 2019  1:01 PM. If you have any questions, ask your nurse or doctor.        acetaminophen 500 MG tablet Commonly known as: TYLENOL Take as needed by mouth.   Albuterol Sulfate 108 (90 Base) MCG/ACT Aepb Inhale as needed into the lungs.   aspirin 81 MG tablet Take by mouth.   atorvastatin 20 MG tablet Commonly known as: LIPITOR Take 20 mg by mouth daily.   buPROPion 300 MG 24 hr tablet Commonly known as: WELLBUTRIN XL Take by mouth.   busPIRone 15 MG tablet Commonly known as: BUSPAR Take 15 mg by mouth 2 (two) times a day.   cholecalciferol 25 MCG (1000 UNIT) tablet Commonly known as: VITAMIN D Take by mouth.   clobetasol ointment 0.05 % Commonly known as: TEMOVATE Apply 1 application topically 2 (two) times daily.   clotrimazole-betamethasone cream Commonly known as: LOTRISONE Apply 1 application topically 2 (two) times daily.   diclofenac sodium 1 % Gel Commonly known as: VOLTAREN Apply topically 4 (four) times daily.   DULoxetine 60 MG capsule Commonly known as: CYMBALTA Take 1 capsule (60 mg total) by mouth daily.   esomeprazole 40 MG capsule Commonly known as: NEXIUM Take by mouth.   NexIUM 40 MG capsule Generic drug: esomeprazole Take 40 mg by mouth daily.   ezetimibe 10 MG tablet Commonly known as: ZETIA Take 10 mg by mouth daily.   fenofibrate 48 MG tablet Commonly known as: TRICOR Take 48 mg daily by mouth.   finasteride 5 MG tablet Commonly known as: PROSCAR Take 5 mg daily by mouth.   gabapentin 300 MG capsule Commonly known as: NEURONTIN Take 2 capsules (600 mg total) by mouth at bedtime.   hydrochlorothiazide 25 MG tablet Commonly known as: HYDRODIURIL   ibuprofen 200 MG tablet Commonly known as: ADVIL Take 200 mg by mouth every 6 (six) hours as needed.   meloxicam 15 MG tablet Commonly known as: MOBIC Take  15 mg by mouth daily.   methocarbamol 500 MG tablet Commonly known as: ROBAXIN Take 500 mg by mouth 2 (two) times daily as needed for muscle spasms.   mupirocin ointment 2 % Commonly known as: BACTROBAN Place 1 application into the nose 2 (two) times daily.   niacin-lovastatin 1000-20 MG 24 hr tablet Commonly known as: ADVICOR Take by mouth.   oxyCODONE-acetaminophen 10-325 MG tablet Commonly known as: Percocet Take 1 tablet by mouth every 8 (eight) hours as needed for pain. Must last 30 days.   oxyCODONE-acetaminophen 10-325 MG tablet Commonly known as: Percocet Take 1 tablet by mouth every 8 (eight) hours as needed for pain. Must last 30 days. Start taking on: May 20, 2019   pravastatin 20  MG tablet Commonly known as: PRAVACHOL Take by mouth.   sildenafil 20 MG tablet Commonly known as: REVATIO Take 3 to 5 tablets two hours before intercouse on an empty stomach.  Do not take with nitrates.   tamsulosin 0.4 MG Caps capsule Commonly known as: FLOMAX TAKE 1 CAPSULE BY MOUTH EVERY DAY AFTER SUPPER       Allergies:  Allergies  Allergen Reactions  . Niacin-Lovastatin Er Other (See Comments)    unknown unknown  . Atorvastatin Other (See Comments)  . Simvastatin Other (See Comments)  . Propoxyphene Nausea Only    Passed out after taking it on an empty stomach    Family History: Family History  Problem Relation Age of Onset  . Heart disease Mother     Social History:  reports that he quit smoking about 7 years ago. His smoking use included cigarettes. He has a 67.50 pack-year smoking history. He has never used smokeless tobacco. He reports that he does not drink alcohol or use drugs.  ROS: UROLOGY Frequent Urination?: Yes Hard to postpone urination?: No Burning/pain with urination?: No Get up at night to urinate?: Yes Leakage of urine?: Yes Urine stream starts and stops?: No Trouble starting stream?: No Do you have to strain to urinate?: No Blood in  urine?: No Urinary tract infection?: No Sexually transmitted disease?: No Injury to kidneys or bladder?: No Painful intercourse?: No Weak stream?: No Erection problems?: No Penile pain?: No  Gastrointestinal Nausea?: No Vomiting?: No Indigestion/heartburn?: No Diarrhea?: No Constipation?: No  Constitutional Fever: No Night sweats?: No Weight loss?: No Fatigue?: No  Skin Skin rash/lesions?: No Itching?: No  Eyes Blurred vision?: No Double vision?: No  Ears/Nose/Throat Sore throat?: No Sinus problems?: No  Hematologic/Lymphatic Swollen glands?: No Easy bruising?: No  Cardiovascular Leg swelling?: No Chest pain?: No  Respiratory Cough?: No Shortness of breath?: No  Endocrine Excessive thirst?: No  Musculoskeletal Back pain?: Yes Joint pain?: No  Neurological Headaches?: No Dizziness?: No  Psychologic Depression?: Yes Anxiety?: Yes  Physical Exam: BP 129/84   Pulse 92   Ht 5\' 8"  (1.727 m)   Wt 220 lb (99.8 kg)   BMI 33.45 kg/m   Constitutional:  Alert and oriented, No acute distress. HEENT: [place] AT, moist mucus membranes.  Trachea midline, no masses. Cardiovascular: No clubbing, cyanosis, or edema. Respiratory: Normal respiratory effort, no increased work of breathing. GI: Abdomen is soft, nontender, nondistended, no abdominal masses GU: Erythema inguinal areas bilaterally L >R.  Testes atrophic, descended bilaterally without masses or tenderness. Skin: No rashes, bruises or suspicious lesions. Neurologic: Grossly intact, no focal deficits, moving all 4 extremities. Psychiatric: Normal mood and affect.   Assessment & Plan:    - Prostate cancer [facility]) He was given a 10-month Eligard injection today.  He will keep his follow-up in radiation oncology.  - Tinea cruris Recommended miconazole.  Dermatology eval if this is not effective.   Abbie Sons, [place] [address], [place],  [place] [address] [phone]

## 2019-05-05 NOTE — Progress Notes (Signed)
Eligard SubQ Injection   Due to Prostate Cancer patient is present today for a Eligard Injection.  Medication: Eligard 6 month Dose: 45 mg  Location: right  Lot: [id number]  [id number]  Patient tolerated well, no complications were noted  Performed [id number] Jabarri Stefanelli CMA

## 2019-05-22 ENCOUNTER — Telehealth: Payer: Self-pay | Admitting: *Deleted

## 2019-05-22 DIAGNOSIS — Z87891 Personal history of nicotine dependence: Secondary | ICD-10-CM

## 2019-05-22 NOTE — Telephone Encounter (Signed)
 (  05/22/19) Patient has been notified that lung cancer screening CT scan is due currently or will be in near future. Confirmed that patient is within the appropriate age range, and asymptomatic. Patient denies illness that would prevent curative treatment for lung cancer if found. Verified smoking history (Former Smoker since approx. 2014, 2.5 ppd). Patient is agreeable for CT scan being scheduled, has appt @ the [facility] on 06/12/19 @ 2 pm and would like CT scheduled sometime before or after.   SRW

## 2019-05-23 NOTE — Addendum Note (Signed)
Addended by: Lieutenant Diego on: 05/23/2019 02:57 PM   Modules accepted: Orders

## 2019-05-23 NOTE — Telephone Encounter (Signed)
Smoking history: former, quit 2014, 67.5 pack year

## 2019-05-30 ENCOUNTER — Other Ambulatory Visit: Payer: Self-pay | Admitting: Student in an Organized Health Care Education/Training Program

## 2019-06-05 ENCOUNTER — Inpatient Hospital Stay: Payer: PPO | Attending: Radiation Oncology

## 2019-06-10 ENCOUNTER — Other Ambulatory Visit: Payer: Self-pay

## 2019-06-10 ENCOUNTER — Ambulatory Visit
Payer: PPO | Attending: Student in an Organized Health Care Education/Training Program | Admitting: Student in an Organized Health Care Education/Training Program

## 2019-06-10 ENCOUNTER — Encounter: Payer: Self-pay | Admitting: Student in an Organized Health Care Education/Training Program

## 2019-06-10 ENCOUNTER — Telehealth: Payer: Self-pay | Admitting: *Deleted

## 2019-06-10 ENCOUNTER — Other Ambulatory Visit: Payer: Self-pay | Admitting: *Deleted

## 2019-06-10 DIAGNOSIS — G894 Chronic pain syndrome: Secondary | ICD-10-CM

## 2019-06-10 DIAGNOSIS — M79604 Pain in right leg: Secondary | ICD-10-CM | POA: Diagnosis not present

## 2019-06-10 DIAGNOSIS — M533 Sacrococcygeal disorders, not elsewhere classified: Secondary | ICD-10-CM | POA: Diagnosis not present

## 2019-06-10 DIAGNOSIS — M79605 Pain in left leg: Secondary | ICD-10-CM

## 2019-06-10 DIAGNOSIS — C61 Malignant neoplasm of prostate: Secondary | ICD-10-CM

## 2019-06-10 DIAGNOSIS — M47816 Spondylosis without myelopathy or radiculopathy, lumbar region: Secondary | ICD-10-CM

## 2019-06-10 MED ORDER — OXYCODONE-ACETAMINOPHEN 10-325 MG PO TABS: 1.0000 | ORAL_TABLET | Freq: Three times a day (TID) | ORAL | 0 refills | Status: DC | PRN

## 2019-06-10 MED ORDER — OXYCODONE-ACETAMINOPHEN 10-325 MG PO TABS: 1.0000 | ORAL_TABLET | Freq: Three times a day (TID) | ORAL | 0 refills | Status: AC | PRN

## 2019-06-10 MED ORDER — DULOXETINE HCL 60 MG PO CPEP: 60.0000 mg | ORAL_CAPSULE | Freq: Every day | ORAL | 4 refills | Status: DC

## 2019-06-10 NOTE — Progress Notes (Signed)
Patient: Gary [name]  Service Category: E/M  Provider: Gillis Santa, MD  DOB: 1950-09-25  DOS: 06/10/2019  Location: Office  MRN: [id number]  Setting: Ambulatory outpatient  Referring Provider: Kirk Ruths, MD  Type: Established Patient  Specialty: Interventional Pain Management  PCP: Kirk Ruths, MD  Location: Home  Delivery: TeleHealth     Virtual Encounter - Pain Management PROVIDER NOTE: Information contained herein reflects review and annotations entered in association with encounter. Interpretation of such information and data should be left to medically-trained personnel. Information provided to patient can be located elsewhere in the medical record under "Patient Instructions". Document created using STT-dictation technology, any transcriptional errors that may result from process are unintentional.    Contact & Pharmacy Preferred: [phone] Home: [phone] (home) Mobile: [phone] (mobile) E-mail: No e-mail address on record  [place], [place] - [address] [place] [address] Phone: [phone] Fax: [phone]   Pre-screening  [name] offered "in-person" vs "virtual" encounter. He indicated preferring virtual for this encounter.   Reason COVID-19*  Social distancing based on CDC and AMA recommendations.   I contacted Gary [name] on 06/10/2019 via telephone.      I clearly identified myself as Gillis Santa, MD. I verified that I was speaking with the correct person using two identifiers (Name: Gary [name], and date of birth: 1950/11/22).  This visit was completed via telephone due to the restrictions of the COVID-19 pandemic. All issues as above were discussed and addressed but no physical exam was performed. If it was felt that the patient should be evaluated in the office, they were directed there. The patient verbally consented to this visit. Patient was unable to complete an audio/visual  visit due to Technical difficulties and/or Lack of internet. Due to the catastrophic nature of the COVID-19 pandemic, this visit was done through audio contact only.  Location of the patient: home address (see Epic for details)  Location of the provider: office Consent I sought verbal advanced consent from Gary [name] for virtual visit interactions. I informed Gary [name] of possible security and privacy concerns, risks, and limitations associated with providing "not-in-person" medical evaluation and management services. I also informed Gary [name] of the availability of "in-person" appointments. Finally, I informed him that there would be a charge for the virtual visit and that he could be  personally, fully or partially, financially responsible for it. Gary [name] expressed understanding and agreed to proceed.   Historic Elements   Gary [name] is a 69 y.o. year old, male patient evaluated today after his last contact with our practice on 05/30/2019. Gary [name]  has a past medical history of Anxiety, Benign essential HTN (06/24/2015), BPH (benign prostatic hyperplasia), COPD (chronic obstructive pulmonary disease) ([place]), Coronary artery disease (02/16/2014), DDD (degenerative disc disease), cervical, Degenerative disc disease, cervical (01/22/2017), Depression, DJD (degenerative joint disease), Emphysema of lung ([place]), Heel pain (10/10/2017), Hyperglobulinemia, Lumbar degenerative disc disease (01/22/2017), Major depression in remission ([place]) (03/10/2014), Migraines, Mitral regurgitation, Mixed hyperlipidemia (02/16/2014), Neck pain (01/24/2017), OSA (obstructive sleep apnea) (07/05/2016), Osteoarthritis of knee (08/12/2014), RA (rheumatoid arthritis) ([place]), Spinal stenosis of lumbar region with neurogenic claudication (04/18/2016), Spondylosis without myelopathy or radiculopathy, lumbar region (01/22/2017), and Venous insufficiency of both lower extremities (11/07/2016). He also  has a past surgical  history that includes Cholecystectomy; Knee surgery (Left); deviated septum repair; Cardiac catheterization; Colonoscopy; Nasal septum surgery; Colonoscopy with propofol (N/A, 01/09/2018); and Esophagogastroduodenoscopy (egd) with  propofol (N/A, 01/09/2018). Gary [name] has a current medication list which includes the following prescription(s): acetaminophen, albuterol sulfate, aspirin, atorvastatin, bupropion, buspirone, cholecalciferol, clobetasol ointment, clotrimazole-betamethasone, diclofenac sodium, duloxetine, esomeprazole, esomeprazole, ezetimibe, fenofibrate, finasteride, gabapentin, hydrochlorothiazide, ibuprofen, meloxicam, methocarbamol, mupirocin ointment, niacin-lovastatin, [START ON 06/19/2019] oxycodone-acetaminophen, [START ON 07/19/2019] oxycodone-acetaminophen, [START ON 08/18/2019] oxycodone-acetaminophen, pravastatin, sildenafil, and tamsulosin, and the following Facility-Administered Medications: leuprolide (6 month). He  reports that he quit smoking about 7 years ago. His smoking use included cigarettes. He has a 67.50 pack-year smoking history. He has never used smokeless tobacco. He reports that he does not drink alcohol or use drugs. Gary [name] is allergic to niacin-lovastatin er; atorvastatin; simvastatin; and propoxyphene.   HPI  Today, he is being contacted for medication management.   No change in medical history since last visit.  Patient's pain is at baseline.  Patient continues multimodal pain regimen as prescribed.  States that it provides pain relief and improvement in functional status.  Pharmacotherapy Assessment  Analgesic: 05/20/2019  1   03/13/2019  Oxycodone-Acetaminophen 10-325  90.00  30 Bi Lat   [id number]   Thr (4878)   0  45.00 MME  Medicaid   [place]    Monitoring: [place] PMP: PDMP reviewed during this encounter.       Pharmacotherapy: No side-effects or adverse reactions reported. Compliance: No problems identified. Effectiveness: Clinically acceptable. Plan: Refer to  "POC".  UDS:  Summary  Date Value Ref Range Status  05/28/2018 FINAL  Final    Comment:    ==================================================================== TOXASSURE SELECT 13 (MW) ==================================================================== Test                             Result       Flag       Units Drug Present   Oxycodone                      413                     ng/mg creat   Oxymorphone                    349                     ng/mg creat   Noroxycodone                   584                     ng/mg creat   Noroxymorphone                 158                     ng/mg creat    Sources of oxycodone are scheduled prescription medications.    Oxymorphone, noroxycodone, and noroxymorphone are expected    metabolites of oxycodone. Oxymorphone is also available as a    scheduled prescription medication. ==================================================================== Test                      Result    Flag   Units      Ref Range   Creatinine              190              mg/dL      >=  20 ==================================================================== Declared Medications:  Medication list was not provided. ==================================================================== For clinical consultation, please call [phone]. ====================================================================    Laboratory Chemistry Profile   Renal No results found for: BUN, CREATININE, LABCREA, BCR, GFR, GFRAA, GFRNONAA, LABVMA, EPIRU, [id number], NOREPRU, [id number], DOPARU, [id number]  Hepatic No results found for: AST, ALT, ALBUMIN, ALKPHOS, HCVAB, AMYLASE, LIPASE, AMMONIA  Electrolytes No results found for: NA, K, CL, CALCIUM, MG, PHOS  Bone Lab Results  Component Value Date   TESTOSTERONE <3 (L) 04/29/2018    Inflammation (CRP: Acute Phase) (ESR: Chronic Phase) No results found for: CRP, ESRSEDRATE, LATICACIDVEN    Note: Above Lab results  reviewed.   Assessment  The primary encounter diagnosis was Facet arthropathy, lumbar. Diagnoses of Chronic pain syndrome, Lumbar spondylosis, Spondylosis without myelopathy or radiculopathy, lumbar region, SI (sacroiliac) joint dysfunction, and Bilateral leg pain were also pertinent to this visit.  Plan of Care   Gary [name] has a current medication list which includes the following long-term medication(s): albuterol sulfate, atorvastatin, bupropion, duloxetine, esomeprazole, esomeprazole, ezetimibe, fenofibrate, gabapentin, hydrochlorothiazide, niacin-lovastatin, and pravastatin.  Pharmacotherapy (Medications Ordered): Meds ordered this encounter  Medications  . oxyCODONE-acetaminophen (PERCOCET) 10-325 MG tablet    Sig: Take 1 tablet by mouth every 8 (eight) hours as needed for pain. Must last 30 days.    Dispense:  90 tablet    Refill:  0    [place] STOP ACT - Not applicable. Fill one day early if pharmacy is closed on scheduled refill date.  Marland Kitchen oxyCODONE-acetaminophen (PERCOCET) 10-325 MG tablet    Sig: Take 1 tablet by mouth every 8 (eight) hours as needed for pain. Must last 30 days.    Dispense:  90 tablet    Refill:  0    [place] STOP ACT - Not applicable. Fill one day early if pharmacy is closed on scheduled refill date.  Marland Kitchen oxyCODONE-acetaminophen (PERCOCET) 10-325 MG tablet    Sig: Take 1 tablet by mouth every 8 (eight) hours as needed for pain. Must last 30 days.    Dispense:  90 tablet    Refill:  0    [place] STOP ACT - Not applicable. Fill one day early if pharmacy is closed on scheduled refill date.  . DULoxetine (CYMBALTA) 60 MG capsule    Sig: Take 1 capsule (60 mg total) by mouth daily.    Dispense:  30 capsule    Refill:  4   Orders:  Orders Placed This Encounter  Procedures  . ToxASSURE Select 13 (MW), Urine    Volume: 30 ml(s). Minimum 3 ml of urine is needed. Document temperature of fresh sample. Indications: Long term (current) use of opiate analgesic  (S17.793)   Follow-up plan:   Return in about 3 months (around 09/10/2019) for Medication Management, in person.     s/p b/l SI-J 12/02/2018- not helpful, plan for lumbar facets L3-S1 b/l      Recent Visits Date Type Provider Dept  03/13/19 Office Visit Gillis Santa, MD [facility]  Showing recent visits within past 90 days and meeting all other requirements   Today's Visits Date Type Provider Dept  06/10/19 Office Visit Gillis Santa, MD [facility]  Showing today's visits and meeting all other requirements   Future Appointments No visits were found meeting these conditions.  Showing future appointments within next 90 days and meeting all other requirements   I discussed the assessment and treatment plan with the patient. The patient was provided an opportunity to ask  questions and all were answered. The patient agreed with the plan and demonstrated an understanding of the instructions.  Patient advised to call back or seek an in-person evaluation if the symptoms or condition worsens.  Duration of encounter: 25 minutes.  Note by: Gillis Santa, MD Date: 06/10/2019; Time: 9:02 AM

## 2019-06-12 ENCOUNTER — Ambulatory Visit: Admission: RE | Admit: 2019-06-12 | Payer: PPO | Source: Ambulatory Visit

## 2019-06-12 ENCOUNTER — Other Ambulatory Visit: Payer: Self-pay

## 2019-06-12 ENCOUNTER — Inpatient Hospital Stay: Payer: PPO | Attending: Radiation Oncology

## 2019-06-12 ENCOUNTER — Ambulatory Visit: Payer: PPO | Admitting: Radiation Oncology

## 2019-06-12 DIAGNOSIS — C61 Malignant neoplasm of prostate: Secondary | ICD-10-CM | POA: Insufficient documentation

## 2019-06-13 LAB — PSA: Prostatic Specific Antigen: <0.01 ng/mL (ref 0.00–4.00)

## 2019-06-19 ENCOUNTER — Other Ambulatory Visit: Payer: Self-pay

## 2019-06-20 ENCOUNTER — Ambulatory Visit
Admission: RE | Admit: 2019-06-20 | Discharge: 2019-06-20 | Disposition: A | Discharge status: Home / Self Care | Payer: PPO | Source: Ambulatory Visit | Attending: Radiation Oncology | Admitting: Radiation Oncology

## 2019-06-20 VITALS — BP 136/94 | HR 84 | Temp 96.4°F | Resp 16 | Wt 218.1 lb

## 2019-06-20 DIAGNOSIS — N393 Stress incontinence (female) (male): Secondary | ICD-10-CM | POA: Diagnosis not present

## 2019-06-20 DIAGNOSIS — C61 Malignant neoplasm of prostate: Secondary | ICD-10-CM | POA: Diagnosis not present

## 2019-06-20 DIAGNOSIS — R232 Flushing: Secondary | ICD-10-CM | POA: Insufficient documentation

## 2019-06-20 DIAGNOSIS — Z923 Personal history of irradiation: Secondary | ICD-10-CM | POA: Insufficient documentation

## 2019-06-20 NOTE — Progress Notes (Signed)
Radiation Oncology Follow up Note  Name: Gary [name]   Date:   06/20/2019 MRN:  [id number] DOB: [birth year]    This 69 y.o. male presents to the clinic today for 50-month follow-up status post IMRT radiation therapy for stage IIb Gleason 7 (4+3 adenocarcinoma the prostate.  REFERRING PROVIDER: Kirk Ruths, MD  HPI: Patient is a 69 year old male now out 10 months having completed IMRT radiation therapy to his prostate and pelvic nodes for Gleason 7 (4+3) adenocarcinoma.  His PSA remains less than 0.01 he continues to be on androgen deprivation therapy.  He is having some slight stress incontinence.  Also complains of night sweats associated with his androgen deprivation therapy..  COMPLICATIONS OF TREATMENT: none  FOLLOW UP COMPLIANCE: keeps appointments   PHYSICAL EXAM:  BP (!) 136/94   Pulse 84   Temp (!) 96.4 F (35.8 C)   Resp 16   Wt 218 lb 1.6 oz (98.9 kg)   SpO2 94%   BMI 33.16 kg/m  Well-developed well-nourished patient in NAD. HEENT reveals PERLA, EOMI, discs not visualized.  Oral cavity is clear. No oral mucosal lesions are identified. Neck is clear without evidence of cervical or supraclavicular adenopathy. Lungs are clear to A&P. Cardiac examination is essentially unremarkable with regular rate and rhythm without murmur rub or thrill. Abdomen is benign with no organomegaly or masses noted. Motor sensory and DTR levels are equal and symmetric in the upper and lower extremities. Cranial nerves II through XII are grossly intact. Proprioception is intact. No peripheral adenopathy or edema is identified. No motor or sensory levels are noted. Crude visual fields are within normal range.  RADIOLOGY RESULTS: No current films to review  PLAN: Present time patient is doing well under excellent biochemical control of his prostate cancer.  He continues close follow-up care with urology and androgen deprivation therapy.  I have asked to see him back in 1 year for follow-up.   I have suggested some vitamin E supplements for his night sweats.  Patient knows to call with any concerns.  I would like to take this opportunity to thank you for allowing me to participate in the care of your patient.Noreene Filbert, MD

## 2019-06-22 ENCOUNTER — Other Ambulatory Visit: Payer: Self-pay | Admitting: Student in an Organized Health Care Education/Training Program

## 2019-07-01 ENCOUNTER — Telehealth: Payer: Self-pay | Admitting: *Deleted

## 2019-07-01 DIAGNOSIS — Z87891 Personal history of nicotine dependence: Secondary | ICD-10-CM

## 2019-07-01 NOTE — Telephone Encounter (Signed)
(  07/01/19) Pt has been notified that lung cancer screening CT scan is due currently or will be in near future. Confirmed pt is within appropriate age range, and asymptomatic. Pt denies illness that would prevent curative treatment for lung cancer if found. Verified smoking history (Former Smoker since 2014, 2 ppd) Pt is agreeable for CT scan being scheduled, and has availability Friday, March 26, and Monday the 29th-31st. He prefers a morning appt, around 9-10 am  SRW

## 2019-07-03 NOTE — Telephone Encounter (Signed)
Smoking history: former, quit 2014, 67.5 pack year

## 2019-07-03 NOTE — Addendum Note (Signed)
Addended by: Lieutenant Diego on: 07/03/2019 11:45 AM   Modules accepted: Orders

## 2019-07-08 ENCOUNTER — Ambulatory Visit
Admission: RE | Admit: 2019-07-08 | Discharge: 2019-07-08 | Disposition: A | Discharge status: Home / Self Care | Payer: PPO | Source: Ambulatory Visit | Attending: Oncology | Admitting: Oncology

## 2019-07-08 ENCOUNTER — Other Ambulatory Visit: Payer: Self-pay

## 2019-07-08 DIAGNOSIS — Z87891 Personal history of nicotine dependence: Secondary | ICD-10-CM | POA: Diagnosis not present

## 2019-07-10 ENCOUNTER — Encounter: Payer: Self-pay | Admitting: *Deleted

## 2019-07-17 ENCOUNTER — Ambulatory Visit: Payer: PPO | Attending: Internal Medicine

## 2019-07-17 DIAGNOSIS — Z23 Encounter for immunization: Secondary | ICD-10-CM

## 2019-07-17 NOTE — Progress Notes (Signed)
   Covid-19 Vaccination Clinic  Name:  Gary [name]    MRN: [id number] DOB: 1950/08/18  07/17/2019  [name] was observed post Covid-19 immunization for 15 minutes without incident. He was provided with Vaccine Information Sheet and instruction to access the V-Safe system.   [name] was instructed to call 911 with any severe reactions post vaccine: Marland Kitchen Difficulty breathing  . Swelling of face and throat  . A fast heartbeat  . A bad rash all over body  . Dizziness and weakness   Immunizations Administered    Name Date Dose VIS Date Route   Pfizer COVID-19 Vaccine 07/17/2019 10:48 AM 0.3 mL 03/21/2019 Intramuscular   Manufacturer: [place]   Lot: [id number]   [place]: [id number]

## 2019-07-22 ENCOUNTER — Other Ambulatory Visit: Payer: Self-pay | Admitting: Radiation Oncology

## 2019-08-12 ENCOUNTER — Ambulatory Visit: Payer: PPO

## 2019-08-15 ENCOUNTER — Ambulatory Visit: Payer: PPO

## 2019-08-16 ENCOUNTER — Ambulatory Visit: Payer: PPO | Attending: Internal Medicine

## 2019-08-16 DIAGNOSIS — Z23 Encounter for immunization: Secondary | ICD-10-CM

## 2019-08-16 NOTE — Progress Notes (Signed)
   Covid-19 Vaccination Clinic  Name:  Gary [name]    MRN: [id number] DOB: [birth year]  08/16/2019  Gary [name] was observed post Covid-19 immunization for 15 minutes without incident. He was provided with Vaccine Information Sheet and instruction to access the V-Safe system.   Gary [name] was instructed to call 911 with any severe reactions post vaccine: Marland Kitchen Difficulty breathing  . Swelling of face and throat  . A fast heartbeat  . A bad rash all over body  . Dizziness and weakness   Immunizations Administered    Name Date Dose VIS Date Route   Pfizer COVID-19 Vaccine 08/16/2019 11:51 AM 0.3 mL 06/04/2018 Intramuscular   Manufacturer: [place]   Lot: [id number]   [place]: [id number]

## 2019-08-28 DIAGNOSIS — E118 Type 2 diabetes mellitus with unspecified complications: Secondary | ICD-10-CM | POA: Diagnosis not present

## 2019-08-28 DIAGNOSIS — J40 Bronchitis, not specified as acute or chronic: Secondary | ICD-10-CM | POA: Diagnosis not present

## 2019-09-04 DIAGNOSIS — G894 Chronic pain syndrome: Secondary | ICD-10-CM | POA: Diagnosis not present

## 2019-09-08 LAB — TOXASSURE SELECT 13 (MW), URINE

## 2019-09-09 ENCOUNTER — Ambulatory Visit
Payer: PPO | Attending: Student in an Organized Health Care Education/Training Program | Admitting: Student in an Organized Health Care Education/Training Program

## 2019-09-09 ENCOUNTER — Other Ambulatory Visit: Payer: Self-pay

## 2019-09-09 ENCOUNTER — Encounter: Payer: Self-pay | Admitting: Student in an Organized Health Care Education/Training Program

## 2019-09-09 VITALS — BP 137/80 | HR 97 | Temp 96.6°F | Resp 18 | Ht 68.0 in | Wt 218.0 lb

## 2019-09-09 DIAGNOSIS — M533 Sacrococcygeal disorders, not elsewhere classified: Secondary | ICD-10-CM | POA: Diagnosis not present

## 2019-09-09 DIAGNOSIS — M79604 Pain in right leg: Secondary | ICD-10-CM | POA: Diagnosis not present

## 2019-09-09 DIAGNOSIS — M5136 Other intervertebral disc degeneration, lumbar region: Secondary | ICD-10-CM

## 2019-09-09 DIAGNOSIS — M79605 Pain in left leg: Secondary | ICD-10-CM | POA: Diagnosis not present

## 2019-09-09 DIAGNOSIS — G8929 Other chronic pain: Secondary | ICD-10-CM | POA: Diagnosis not present

## 2019-09-09 DIAGNOSIS — G894 Chronic pain syndrome: Secondary | ICD-10-CM | POA: Insufficient documentation

## 2019-09-09 DIAGNOSIS — M25512 Pain in left shoulder: Secondary | ICD-10-CM | POA: Diagnosis not present

## 2019-09-09 DIAGNOSIS — M47816 Spondylosis without myelopathy or radiculopathy, lumbar region: Secondary | ICD-10-CM | POA: Insufficient documentation

## 2019-09-09 DIAGNOSIS — Z79891 Long term (current) use of opiate analgesic: Secondary | ICD-10-CM | POA: Diagnosis not present

## 2019-09-09 IMAGING — CT CT CHEST LUNG CANCER SCREENING LOW DOSE W/O CM
2 of 5 series · 15 of 40 positions shown, 18 images · non-contrast
Comparison: Chest CT 09/01/2008.

CLINICAL DATA: 67-year-old male former smoker (quit 6 years ago)
with 68 pack-year history of smoking. Lung cancer screening
examination.

EXAM:
CT CHEST WITHOUT CONTRAST LOW-DOSE FOR LUNG CANCER SCREENING
TECHNIQUE: Multidetector CT imaging of the chest was performed following the
standard protocol without IV contrast.

[Series 3: lung · axial · 0.70mm/px · z∈[-1384,-1049]mm · 12 of 371 slices shown, 15 images (1 of 2)]
[im 18/371  mediastinal]
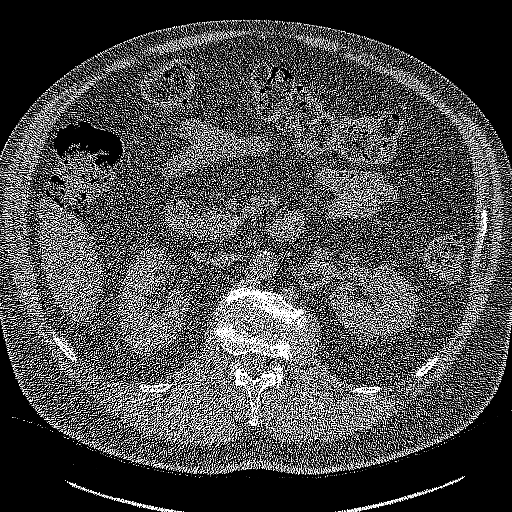
[im 18/371  lung]
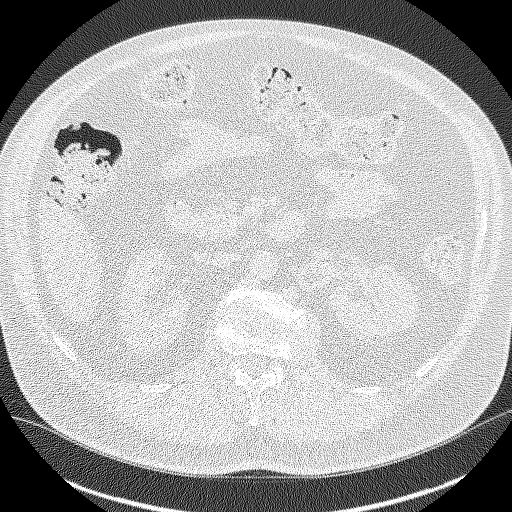
[im 53/371  lung]
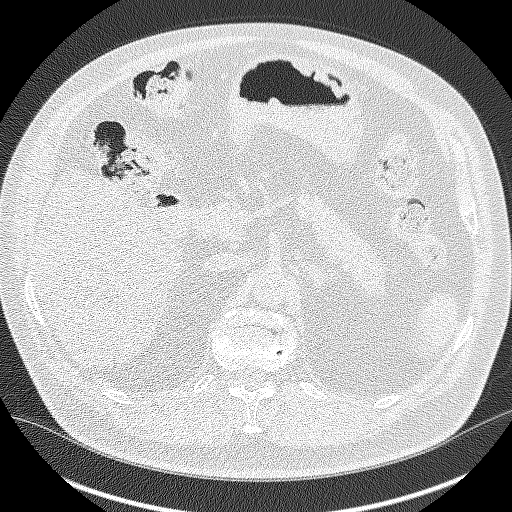
[im 89/371  lung]
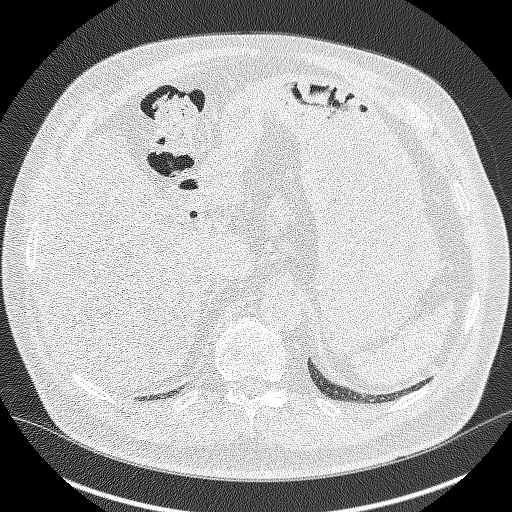
[im 106/371  lung]
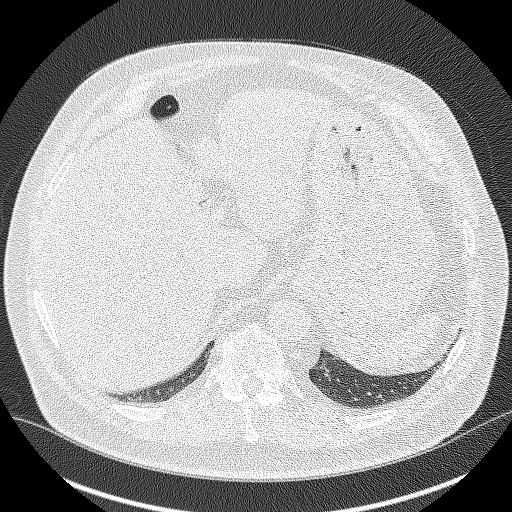
[im 141/371  mediastinal]
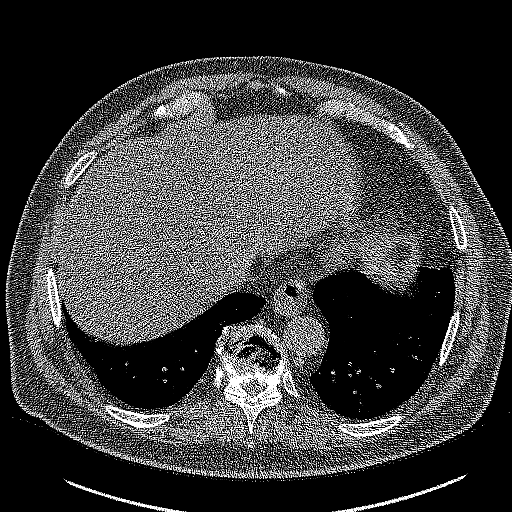
[im 141/371  lung]
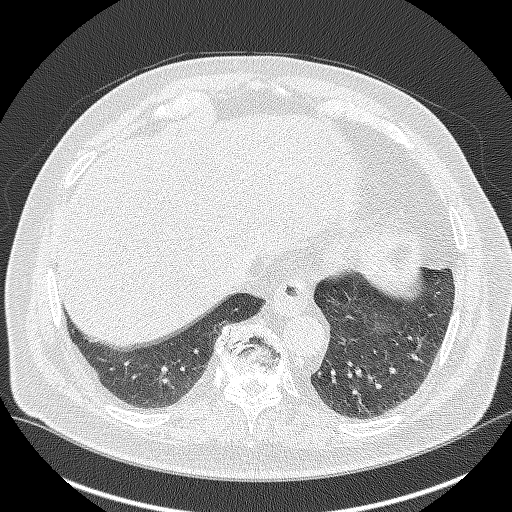
[im 177/371  lung]
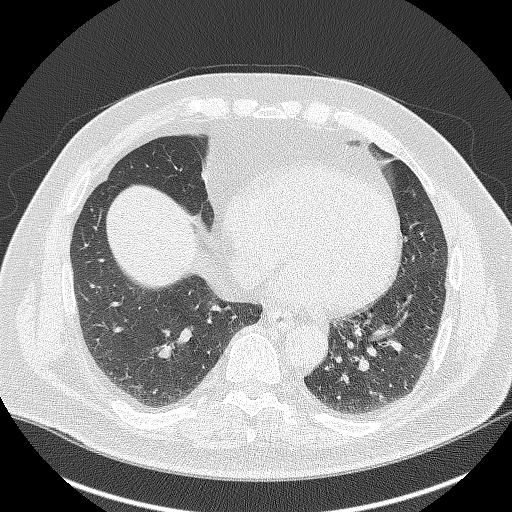
[im 194/371  lung]
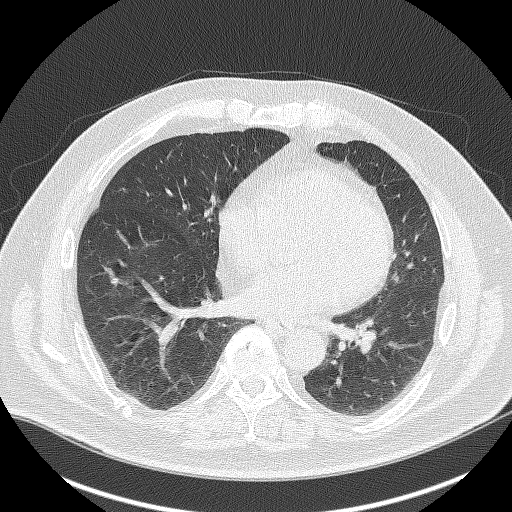
[im 230/371  lung]
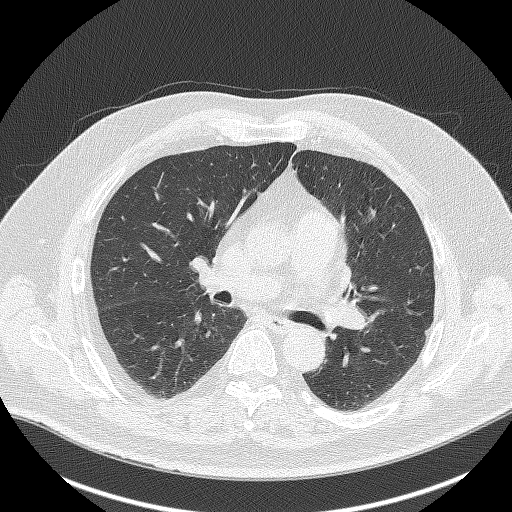
[im 265/371  mediastinal]
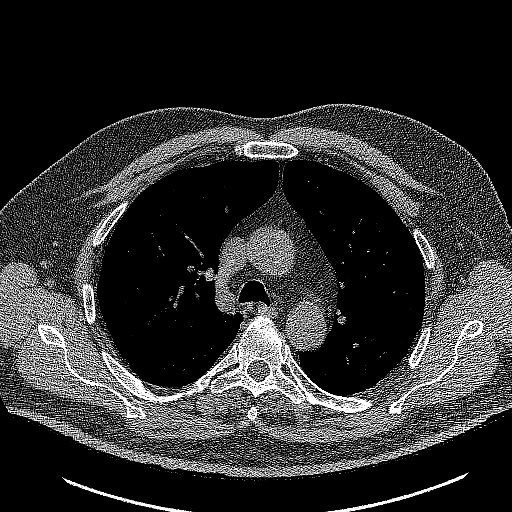
[im 265/371  lung]
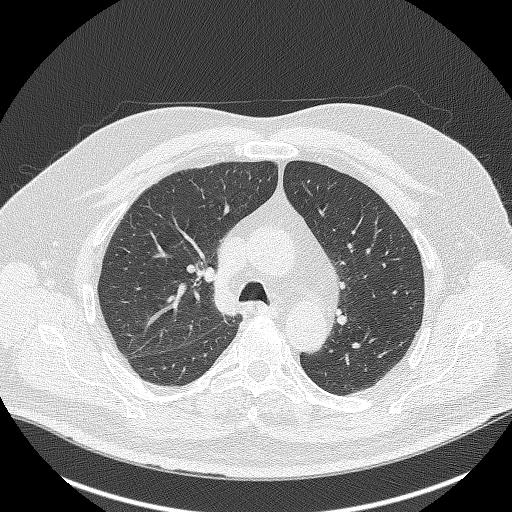
[im 282/371  lung]
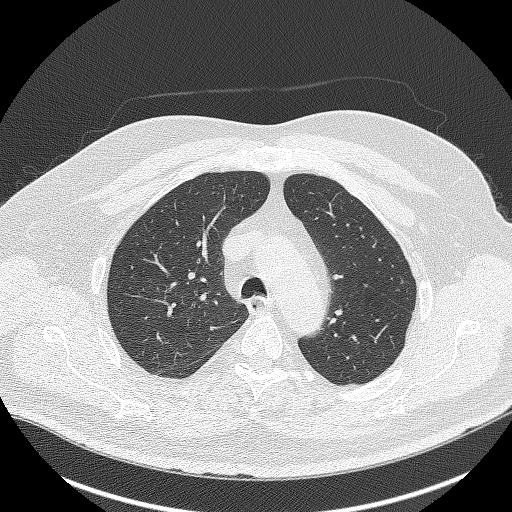
[im 318/371  lung]
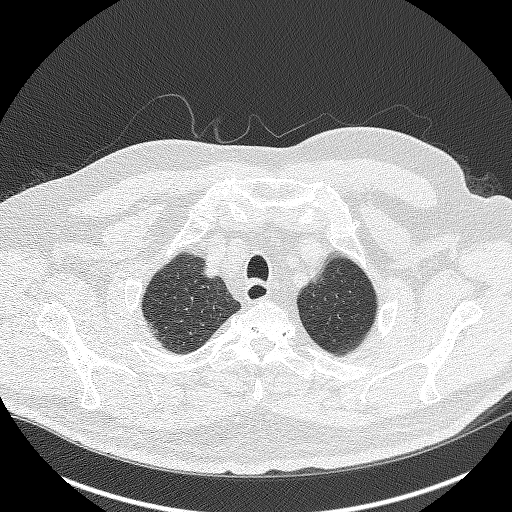
[im 353/371  lung]
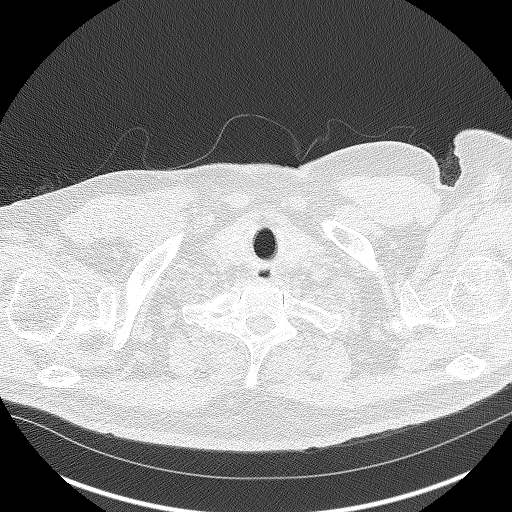

[Series 4: lung · coronal · 0.70mm/px · 3 of 323 slices shown (2 of 2)]
[im 65/323  lung]
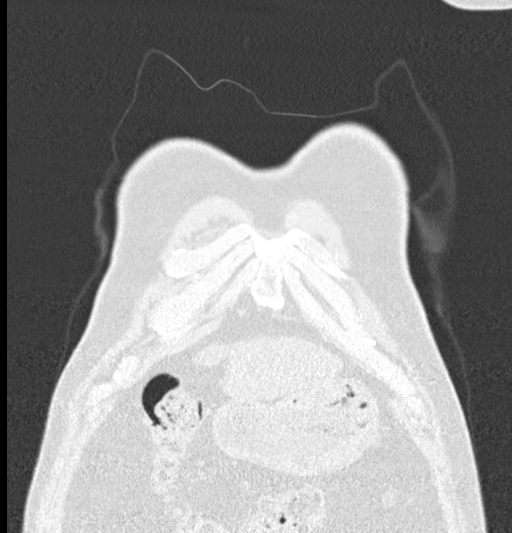
[im 129/323  lung]
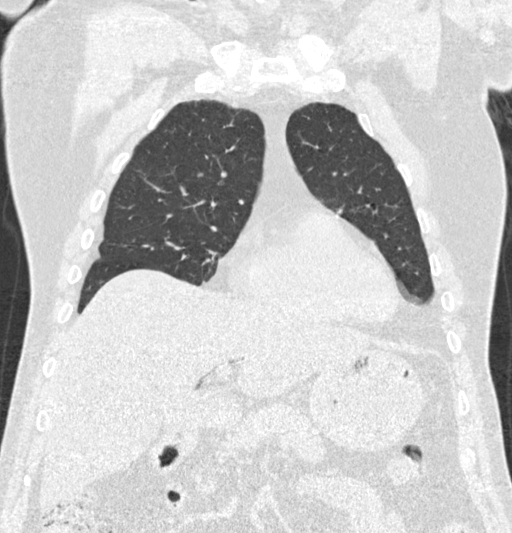
[im 194/323  lung]
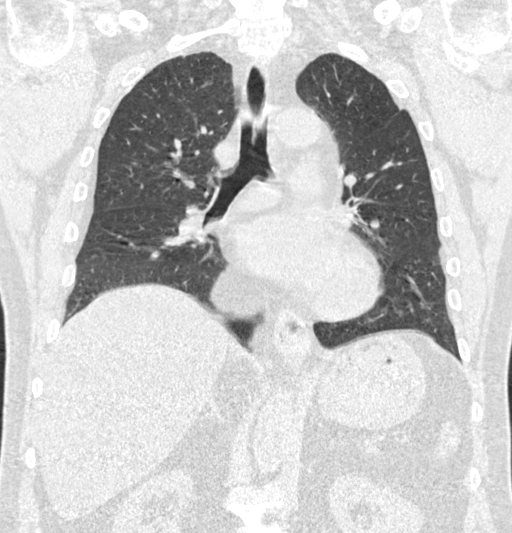

[15 of 40 positions shown; findings below may reference images not displayed]

FINDINGS: Cardiovascular: Heart size is normal. There is no significant
pericardial fluid, thickening or pericardial calcification. Aortic
atherosclerosis. No definite coronary artery calcifications.

Mediastinum/Nodes: No pathologically enlarged mediastinal or hilar
lymph nodes. Please note that accurate exclusion of hilar adenopathy
is limited on noncontrast CT scans. Esophagus is unremarkable in
appearance. No axillary lymphadenopathy.

Lungs/Pleura: Tiny pulmonary nodule in the periphery of the right
upper lobe (axial image 76 of series 3), with a volume derived mean
diameter of only 4.0 mm. No larger more suspicious appearing
pulmonary nodules or masses are noted. No acute consolidative
airspace disease. No pleural effusions. Mild diffuse bronchial wall
thickening with mild centrilobular and paraseptal emphysema.

Upper Abdomen: Diffuse low attenuation throughout the visualized
hepatic parenchyma, indicative of hepatic steatosis. Aortic
atherosclerosis.

Musculoskeletal: There are no aggressive appearing lytic or blastic
lesions noted in the visualized portions of the skeleton.
IMPRESSION: 1. Lung-RADS 2, benign appearance or behavior. Continue annual
screening with low-dose chest CT without contrast in 12 months.
2. Mild diffuse bronchial wall thickening with mild centrilobular
and paraseptal emphysema; imaging findings suggestive of underlying
COPD.
3. Aortic atherosclerosis.
4. Hepatic steatosis.

Aortic Atherosclerosis (W61LF-7LF.F) and Emphysema ([id number].T).

## 2019-09-09 MED ORDER — OXYCODONE-ACETAMINOPHEN 10-325 MG PO TABS: 1.0000 | ORAL_TABLET | Freq: Three times a day (TID) | ORAL | 0 refills | Status: AC | PRN

## 2019-09-09 MED ORDER — OXYCODONE-ACETAMINOPHEN 10-325 MG PO TABS: 1.0000 | ORAL_TABLET | Freq: Three times a day (TID) | ORAL | 0 refills | Status: DC | PRN

## 2019-09-09 NOTE — Progress Notes (Signed)
Nursing Pain Medication Assessment:  Safety precautions to be maintained throughout the outpatient stay will include: orient to surroundings, keep bed in low position, maintain call bell within reach at all times, provide assistance with transfer out of bed and ambulation.  Medication Inspection Compliance: Pill count conducted under aseptic conditions, in front of the patient. Neither the pills nor the bottle was removed from the patient's sight at any time. Once count was completed pills were immediately returned to the patient in their original bottle.  Medication: Oxycodone/APAP Pill/Patch Count: 24 of 90 pills remain Pill/Patch Appearance: Markings consistent with prescribed medication Bottle Appearance: Standard pharmacy container. Clearly labeled. Filled Date: 05/ 10 / 2021 Last Medication intake:  Today

## 2019-09-09 NOTE — Progress Notes (Signed)
PROVIDER NOTE: Information contained herein reflects review and annotations entered in association with encounter. Interpretation of such information and data should be left to medically-trained personnel. Information provided to patient can be located elsewhere in the medical record under "Patient Instructions". Document created using STT-dictation technology, any transcriptional errors that may result from process are unintentional.    Patient: Gary [name]  Service Category: E/M  Provider: Gillis Santa, MD  DOB: 1950/08/08  DOS: 09/09/2019  Referring Provider: Kirk Ruths, MD  MRN: [id number]  Setting: Ambulatory outpatient  PCP: Kirk Ruths, MD  Type: Established Patient  Specialty: Interventional Pain Management    Location: Office  Delivery: Face-to-face     Primary Reason(s) for Visit: Encounter for prescription drug management. (Level of risk: moderate)  CC: Back Pain (low)  HPI  Gary [name] is a 69 y.o. year old, male patient, who comes today for a medication management evaluation. He has Spondylosis without myelopathy or radiculopathy, lumbar region; Lumbar degenerative disc disease; Degenerative disc disease, cervical; Chronic pain syndrome; Facet arthropathy, lumbar; Benign essential HTN; Chronic hyperglycemia; Osteoarthritis of knee; Coronary artery disease; Health care maintenance; Heel pain; Localized, primary osteoarthritis; Low back pain; Major depression in remission ([place]); Mixed hyperlipidemia; Neck pain; OSA (obstructive sleep apnea); Pain medication agreement signed; Spinal stenosis of lumbar region with neurogenic claudication; Venous insufficiency of both lower extremities; DDD (degenerative disc disease), cervical; Other intervertebral disc degeneration, lumbar region; Chronic bilateral low back pain with left-sided sciatica; Chronic left sacroiliac joint pain; Chronic hip pain, left; Cervical spondylosis with radiculopathy; B12 deficiency; Prostate cancer  ([place]); Chronic right SI joint pain; Groin pain, chronic, right; Personal history of tobacco use, presenting hazards to health; Chronic left shoulder pain; Controlled type 2 diabetes mellitus with complication, without long-term current use of insulin ([place]); Rotator cuff tendinitis, left; Statin intolerance; Tendinitis of upper biceps tendon of left shoulder; SI joint arthritis; Aortic atherosclerosis ([place]); and Myalgia due to statin on their problem list. His primarily concern today is the Back Pain (low)  Pain Assessment: Location: Lower Back Radiating: radiates into left side of leg to knee Onset: More than a month ago Duration:   Quality: Aching, Cramping Severity: 7 /10 (subjective, self-reported pain score)  Note: Reported level is compatible with observation.                         When using our objective Pain Scale, levels between 6 and 10/10 are said to belong in an emergency room, as it progressively worsens from a 6/10, described as severely limiting, requiring emergency care not usually available at an outpatient pain management facility. At a 6/10 level, communication becomes difficult and requires great effort. Assistance to reach the emergency department may be required. Facial flushing and profuse sweating along with potentially dangerous increases in heart rate and blood pressure will be evident. Effect on ADL: limtis activities Timing: Constant Modifying factors: nothing BP: 137/80  HR: 97  Gary [name] was last scheduled for an appointment on 06/22/2019 for medication management. During today's appointment we reviewed Gary [name] chronic pain status, as well as his outpatient medication regimen.  Having dizzy spells especially when he stands up. Has not fallen. 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  The patient  reports no  history of drug use. His body mass index is 33.15  kg/m.  Further details on both, my assessment(s), as well as the proposed treatment plan, please see below.  Controlled Substance Pharmacotherapy Assessment REMS (Risk Evaluation and Mitigation Strategy)  Analgesic: 08/18/2019  1   06/10/2019  Oxycodone-Acetaminophen 10-325  90.00  30 Bi Lat   [id number]   Thr (4878)   0  45.00 MME  Medicaid   [place]     [name], RN  09/09/2019  8:36 AM  Signed Nursing Pain Medication Assessment:  Safety precautions to be maintained throughout the outpatient stay will include: orient to surroundings, keep bed in low position, maintain call bell within reach at all times, provide assistance with transfer out of bed and ambulation.  Medication Inspection Compliance: Pill count conducted under aseptic conditions, in front of the patient. Neither the pills nor the bottle was removed from the patient's sight at any time. Once count was completed pills were immediately returned to the patient in their original bottle.  Medication: Oxycodone/APAP Pill/Patch Count: 24 of 90 pills remain Pill/Patch Appearance: Markings consistent with prescribed medication Bottle Appearance: Standard pharmacy container. Clearly labeled. Filled Date: 05/ 10 / 2021 Last Medication intake:  Today   Pharmacokinetics: Liberation and absorption (onset of action): WNL Distribution (time to peak effect): WNL Metabolism and excretion (duration of action): WNL         Pharmacodynamics: Desired effects: Analgesia: Gary [name] reports 50% benefit. Functional ability: Patient reports that medication allows him to accomplish basic ADLs Clinically meaningful improvement in function (CMIF): Sustained CMIF goals met Perceived effectiveness: Described as relatively effective, allowing for increase in activities of daily living (ADL) Undesirable effects: Side-effects or Adverse reactions: None reported Monitoring: [place] PMP: PDMP not reviewed this  encounter. Online review of the past 32-monthperiod conducted. Compliant with practice rules and regulations Last UDS on record: Summary  Date Value Ref Range Status  09/04/2019 Note  Final    Comment:    ==================================================================== [employer] Select 13 (MW) ==================================================================== Test                             Result       Flag       Units Drug Present and Declared for Prescription Verification   Oxycodone                      711          EXPECTED   ng/mg creat   Oxymorphone                    417          EXPECTED   ng/mg creat   Noroxycodone                   1073         EXPECTED   ng/mg creat   Noroxymorphone                 183          EXPECTED   ng/mg creat    Sources of oxycodone are scheduled prescription medications.    Oxymorphone, noroxycodone, and noroxymorphone are expected    metabolites of oxycodone. Oxymorphone is also available as a    scheduled prescription medication. ==================================================================== Test                      Result    Flag  Units      Ref Range   Creatinine              132              mg/dL      >=20 ==================================================================== Declared Medications:  The flagging and interpretation on this report are based on the  following declared medications.  Unexpected results may arise from  inaccuracies in the declared medications.  **Note: The testing scope of this panel includes these medications:  Oxycodone (Percocet)  **Note: The testing scope of this panel does not include the  following reported medications:  Acetaminophen (Tylenol)  Acetaminophen (Percocet)  Albuterol  Aspirin  Atorvastatin (Lipitor)  Bupropion (Wellbutrin)  Buspirone (Buspar)  Duloxetine (Cymbalta)  Esomeprazole (Nexium)  Evolocumab (Repatha)  Ezetimibe (Zetia)  Fenofibrate (TriCor)  Finasteride  (Proscar)  Gabapentin (Neurontin)  Hydrochlorothiazide (Hydrodiuril)  Ibuprofen (Advil)  Lovastatin  Meloxicam (Mobic)  Methocarbamol (Robaxin)  Niacin  Pravastatin (Pravachol)  Sildenafil  Tamsulosin (Flomax)  Topical  Topical Diclofenac  Vitamin D ==================================================================== For clinical consultation, please call [phone]. ====================================================================    UDS interpretation: Compliant          Medication Assessment Form: Reviewed. Patient indicates being compliant with therapy Treatment compliance: Compliant Risk Assessment Profile: Aberrant behavior: See initial evaluations. None observed or detected today Comorbid factors increasing risk of overdose: See initial evaluation. No additional risks detected today Opioid risk tool (ORT):  Opioid Risk  09/09/2019  Alcohol 0  Illegal Drugs 0  Rx Drugs 0  Alcohol 0  Illegal Drugs 0  Rx Drugs 0  Age between 16-45 years  0  History of Preadolescent Sexual Abuse 0  Psychological Disease 0  ADD -  OCD -  Bipolar -  Depression 1  Opioid Risk Tool Scoring 1  Opioid Risk Interpretation Low Risk    ORT Scoring interpretation table:  Score <3 = Low Risk for SUD  Score between 4-7 = Moderate Risk for SUD  Score >8 = High Risk for Opioid Abuse   Risk of substance use disorder (SUD): Low  Risk Mitigation Strategies:  Patient Counseling: Covered Patient-Prescriber Agreement (PPA): Present and active  Notification to other healthcare providers: Done  Pharmacologic Plan: No change in therapy, at this time.             Laboratory Chemistry Profile   Renal Lab Results  Component Value Date   SPECGRAV 1.020 07/29/2018   PHUR 6.0 07/29/2018   PROTEINUR Trace (A) 07/29/2018     Electrolytes No results found for: NA, K, CL, CALCIUM, MG, PHOS   Hepatic No results found for: AST, ALT, ALBUMIN, ALKPHOS, AMYLASE, LIPASE, AMMONIA   ID Lab  Results  Component Value Date   SARSCOV2NAA Not Detected 11/08/2018     Bone Lab Results  Component Value Date   TESTOSTERONE <3 (L) 04/29/2018     Endocrine Lab Results  Component Value Date   GLUCOSEU Negative 07/29/2018   TESTOSTERONE <3 (L) 04/29/2018     Neuropathy No results found for: VITAMINB12, FOLATE, HGBA1C, HIV   CNS No results found for: COLORCSF, APPEARCSF, RBCCOUNTCSF, WBCCSF, POLYSCSF, LYMPHSCSF, EOSCSF, PROTEINCSF, GLUCCSF, JCVIRUS, CSFOLI, IGGCSF, LABACHR, ACETBL, LABACHR, ACETBL   Inflammation (CRP: Acute  ESR: Chronic) No results found for: CRP, ESRSEDRATE, LATICACIDVEN   Rheumatology No results found for: RF, ANA, LABURIC, URICUR, LYMEIGGIGMAB, LYMEABIGMQN, HLAB27   Coagulation Lab Results  Component Value Date   PLT 229 05/08/2018     Cardiovascular Lab Results  Component Value Date   HGB 14.3 05/08/2018   HCT 42.4 05/08/2018     Screening Lab Results  Component Value Date   SARSCOV2NAA Not Detected 11/08/2018     Cancer No results found for: CEA, CA125, LABCA2   Allergens No results found for: ALMOND, APPLE, ASPARAGUS, AVOCADO, BANANA, BARLEY, BASIL, BAYLEAF, GREENBEAN, LIMABEAN, WHITEBEAN, BEEFIGE, REDBEET, BLUEBERRY, BROCCOLI, CABBAGE, MELON, CARROT, CASEIN, CASHEWNUT, CAULIFLOWER, CELERY     Note: Lab results reviewed.   Recent Diagnostic Imaging Results  CT CHEST LUNG CANCER SCREENING LOW DOSE WO CONTRAST CLINICAL DATA:  69 year old male with 60 pack-year history of smoking. Lung cancer screening.  EXAM: CT CHEST WITHOUT CONTRAST LOW-DOSE FOR LUNG CANCER SCREENING  TECHNIQUE: Multidetector CT imaging of the chest was performed following the standard protocol without IV contrast.  COMPARISON:  05/21/2018  FINDINGS: Cardiovascular: The heart size is normal. No substantial pericardial effusion. Atherosclerotic calcification is noted in the wall of the thoracic aorta.  Mediastinum/Nodes: No mediastinal lymphadenopathy. No  evidence for gross hilar lymphadenopathy although assessment is limited by the lack of intravenous contrast on today's study. The esophagus has normal imaging features. There is no axillary lymphadenopathy.  Lungs/Pleura: Centrilobular emphsyema noted. Tiny perifissural nodules identified measuring up to maximum volume derived equivalent diameter of 2.4 mm. No suspicious nodule or mass. No focal airspace consolidation. No pleural effusion.  Upper Abdomen: The liver shows diffusely decreased attenuation suggesting fat deposition.  Musculoskeletal: No worrisome lytic or sclerotic osseous abnormality.  IMPRESSION: Lung-RADS 2, benign appearance or behavior. Continue annual screening with low-dose chest CT without contrast in 12 months.  Hepatic steatosis.  Emphysema (ICD10-J43.9) and Aortic Atherosclerosis (ICD10-170.0)  Electronically Signed   By: Misty Stanley M.D.   On: 07/08/2019 11:13  Complexity Note: Imaging results reviewed. Results shared with Mr. Desai, using Layman's terms.                               Meds   Current Outpatient Medications:  .  acetaminophen (TYLENOL) 500 MG tablet, Take as needed by mouth. , Disp: , Rfl:  .  Albuterol Sulfate 108 (90 Base) MCG/ACT AEPB, Inhale as needed into the lungs. , Disp: , Rfl:  .  aspirin 81 MG tablet, Take by mouth., Disp: , Rfl:  .  buPROPion (WELLBUTRIN XL) 300 MG 24 hr tablet, Take by mouth., Disp: , Rfl:  .  busPIRone (BUSPAR) 15 MG tablet, Take 15 mg by mouth 2 (two) times a day., Disp: , Rfl:  .  cholecalciferol (VITAMIN D) 1000 units tablet, Take by mouth., Disp: , Rfl:  .  clobetasol ointment (TEMOVATE) 1.61 %, Apply 1 application topically 2 (two) times daily., Disp: , Rfl:  .  clotrimazole-betamethasone (LOTRISONE) cream, Apply 1 application topically 2 (two) times daily., Disp: , Rfl:  .  diclofenac sodium (VOLTAREN) 1 % GEL, Apply topically 4 (four) times daily., Disp: , Rfl:  .  DULoxetine (CYMBALTA) 60 MG  capsule, Take 1 capsule (60 mg total) by mouth daily., Disp: 30 capsule, Rfl: 4 .  esomeprazole (NEXIUM) 40 MG capsule, Take 40 mg by mouth daily., Disp: , Rfl:  .  ezetimibe (ZETIA) 10 MG tablet, Take 10 mg by mouth daily. , Disp: , Rfl:  .  fenofibrate (TRICOR) 48 MG tablet, Take 48 mg daily by mouth. , Disp: , Rfl:  .  finasteride (PROSCAR) 5 MG tablet, Take 5 mg daily by mouth. , Disp: , Rfl:  .  gabapentin (NEURONTIN) 300 MG capsule, Take 2 capsules (600 mg total) by mouth at bedtime., Disp: 60 capsule, Rfl: 4 .  hydrochlorothiazide (HYDRODIURIL) 25 MG tablet, , Disp: , Rfl:  .  ibuprofen (ADVIL,MOTRIN) 200 MG tablet, Take 200 mg by mouth every 6 (six) hours as needed., Disp: , Rfl:  .  mupirocin ointment (BACTROBAN) 2 %, Place 1 application into the nose 2 (two) times daily., Disp: , Rfl:  .  niacin-lovastatin (ADVICOR) 1000-20 MG 24 hr tablet, Take by mouth., Disp: , Rfl:  .  [START ON 09/17/2019] oxyCODONE-acetaminophen (PERCOCET) 10-325 MG tablet, Take 1 tablet by mouth every 8 (eight) hours as needed for pain. Must last 30 days., Disp: 90 tablet, Rfl: 0 .  [START ON 10/17/2019] oxyCODONE-acetaminophen (PERCOCET) 10-325 MG tablet, Take 1 tablet by mouth every 8 (eight) hours as needed for pain. Must last 30 days., Disp: 90 tablet, Rfl: 0 .  [START ON 11/16/2019] oxyCODONE-acetaminophen (PERCOCET) 10-325 MG tablet, Take 1 tablet by mouth every 8 (eight) hours as needed for pain. Must last 30 days., Disp: 90 tablet, Rfl: 0 .  pravastatin (PRAVACHOL) 20 MG tablet, Take by mouth., Disp: , Rfl:  .  sildenafil (REVATIO) 20 MG tablet, Take 3 to 5 tablets two hours before intercouse on an empty stomach.  Do not take with nitrates., Disp: 50 tablet, Rfl: 3 .  tamsulosin (FLOMAX) 0.4 MG CAPS capsule, TAKE 1 CAPSULE EVERY DAY AFTER SUPPER, Disp: 90 capsule, Rfl: 3 .  atorvastatin (LIPITOR) 20 MG tablet, Take 20 mg by mouth daily., Disp: , Rfl:  .  meloxicam (MOBIC) 15 MG tablet, Take 15 mg by mouth daily.,  Disp: , Rfl:  .  methocarbamol (ROBAXIN) 500 MG tablet, Take 500 mg by mouth 2 (two) times daily as needed for muscle spasms., Disp: , Rfl:  .  REPATHA SURECLICK 409 MG/ML SOAJ, Inject 2 mLs into the skin every 14 (fourteen) days., Disp: , Rfl:   Current Facility-Administered Medications:  .  leuprolide (6 Month) (ELIGARD) injection 45 mg, 45 mg, Subcutaneous, Once, Stoioff, Scott C, MD  ROS  Constitutional: Denies any fever or chills Gastrointestinal: No reported hemesis, hematochezia, vomiting, or acute GI distress Musculoskeletal: Denies any acute onset joint swelling, redness, loss of ROM, or weakness Neurological: No reported episodes of acute onset apraxia, aphasia, dysarthria, agnosia, amnesia, paralysis, loss of coordination, or loss of consciousness  Allergies  Mr. Yepiz is allergic to niacin-lovastatin er; atorvastatin; simvastatin; and propoxyphene.  [place]  Drug: Mr. Kollmann  reports no history of drug use. Alcohol:  reports no history of alcohol use. Tobacco:  reports that he quit smoking about 7 years ago. His smoking use included cigarettes. He has a 67.50 pack-year smoking history. He has never used smokeless tobacco. Medical:  has a past medical history of Anxiety, Benign essential HTN (06/24/2015), BPH (benign prostatic hyperplasia), COPD (chronic obstructive pulmonary disease) ([place]), Coronary artery disease (02/16/2014), DDD (degenerative disc disease), cervical, Degenerative disc disease, cervical (01/22/2017), Depression, DJD (degenerative joint disease), Emphysema of lung ([place]), Heel pain (10/10/2017), Hyperglobulinemia, Lumbar degenerative disc disease (01/22/2017), Major depression in remission ([place]) (03/10/2014), Migraines, Mitral regurgitation, Mixed hyperlipidemia (02/16/2014), Neck pain (01/24/2017), OSA (obstructive sleep apnea) (07/05/2016), Osteoarthritis of knee (08/12/2014), RA (rheumatoid arthritis) ([employer]), Spinal stenosis of lumbar region with neurogenic claudication  (04/18/2016), Spondylosis without myelopathy or radiculopathy, lumbar region (01/22/2017), and Venous insufficiency of both lower extremities (11/07/2016). Surgical: Mr. Thon  has a past surgical history that includes Cholecystectomy; Knee surgery (Left); deviated septum repair; Cardiac catheterization;  Colonoscopy; Nasal septum surgery; Colonoscopy with propofol (N/A, 01/09/2018); and Esophagogastroduodenoscopy (egd) with propofol (N/A, 01/09/2018). Family: family history includes Heart disease in his mother.  Constitutional Exam  General appearance: Well nourished, well developed, and well hydrated. In no apparent acute distress Vitals:   09/09/19 0829  BP: 137/80  Pulse: 97  Resp: 18  Temp: (!) 96.6 F (35.9 C)  SpO2: 96%  Weight: 218 lb (98.9 kg)  Height: '5\' 8"'  (1.727 m)   BMI Assessment: Estimated body mass index is 33.15 kg/m as calculated from the following:   Height as of this encounter: '5\' 8"'  (1.727 m).   Weight as of this encounter: 218 lb (98.9 kg).  BMI interpretation table: BMI level Category Range association with higher incidence of chronic pain  <18 kg/m2 Underweight   18.5-24.9 kg/m2 Ideal body weight   25-29.9 kg/m2 Overweight Increased incidence by 20%  30-34.9 kg/m2 Obese (Class I) Increased incidence by 68%  35-39.9 kg/m2 Severe obesity (Class II) Increased incidence by 136%  >40 kg/m2 Extreme obesity (Class III) Increased incidence by 254%   Patient's current BMI Ideal Body weight  Body mass index is 33.15 kg/m. Ideal body weight: 68.4 kg (150 lb 12.7 oz) Adjusted ideal body weight: 80.6 kg (177 lb 10.8 oz)   BMI Readings from Last 4 Encounters:  09/09/19 33.15 kg/m  07/08/19 33.15 kg/m  06/19/19 33.16 kg/m  05/05/19 33.45 kg/m   Wt Readings from Last 4 Encounters:  09/09/19 218 lb (98.9 kg)  07/08/19 218 lb (98.9 kg)  06/19/19 218 lb 1.6 oz (98.9 kg)  05/05/19 220 lb (99.8 kg)    Psych/Mental status: Alert, oriented x 3 (person, place, & time)        Eyes: PERLA Respiratory: No evidence of acute respiratory distress  Cervical Spine Exam  Skin & Axial Inspection: No masses, redness, edema, swelling, or associated skin lesions Alignment: Symmetrical Functional ROM: Unrestricted ROM      Stability: No instability detected Muscle Tone/Strength: Functionally intact. No obvious neuro-muscular anomalies detected. Sensory (Neurological): Unimpaired Palpation: No palpable anomalies              Upper Extremity (UE) Exam    Side: Right upper extremity  Side: Left upper extremity  Skin & Extremity Inspection: Skin color, temperature, and hair growth are WNL. No peripheral edema or cyanosis. No masses, redness, swelling, asymmetry, or associated skin lesions. No contractures.  Skin & Extremity Inspection: Skin color, temperature, and hair growth are WNL. No peripheral edema or cyanosis. No masses, redness, swelling, asymmetry, or associated skin lesions. No contractures.  Functional ROM: Unrestricted ROM          Functional ROM: Unrestricted ROM          Muscle Tone/Strength: Functionally intact. No obvious neuro-muscular anomalies detected.  Muscle Tone/Strength: Functionally intact. No obvious neuro-muscular anomalies detected.  Sensory (Neurological): Unimpaired          Sensory (Neurological): Unimpaired          Palpation: No palpable anomalies              Palpation: No palpable anomalies              Provocative Test(s):  Phalen's test: deferred Tinel's test: deferred Apley's scratch test (touch opposite shoulder):  Action 1 (Across chest): deferred Action 2 (Overhead): deferred Action 3 (LB reach): deferred   Provocative Test(s):  Phalen's test: deferred Tinel's test: deferred Apley's scratch test (touch opposite shoulder):  Action 1 (Across chest): deferred Action  2 (Overhead): deferred Action 3 (LB reach): deferred    Thoracic Spine Area Exam  Skin & Axial Inspection: No masses, redness, or swelling Alignment:  Symmetrical Functional ROM: Unrestricted ROM Stability: No instability detected Muscle Tone/Strength: Functionally intact. No obvious neuro-muscular anomalies detected. Sensory (Neurological): Unimpaired Muscle strength & Tone: No palpable anomalies  Lumbar Exam  Skin & Axial Inspection: No masses, redness, or swelling Alignment: Symmetrical Functional ROM: Pain restricted ROM       Stability: No instability detected Muscle Tone/Strength: Functionally intact. No obvious neuro-muscular anomalies detected. Sensory (Neurological): Musculoskeletal pain pattern   Gait & Posture Assessment  Ambulation: Unassisted Gait: Relatively normal for age and body habitus Posture: WNL   Lower Extremity Exam    Side: Right lower extremity  Side: Left lower extremity  Stability: No instability observed          Stability: No instability observed          Skin & Extremity Inspection: Skin color, temperature, and hair growth are WNL. No peripheral edema or cyanosis. No masses, redness, swelling, asymmetry, or associated skin lesions. No contractures.  Skin & Extremity Inspection: Skin color, temperature, and hair growth are WNL. No peripheral edema or cyanosis. No masses, redness, swelling, asymmetry, or associated skin lesions. No contractures.  Functional ROM: Unrestricted ROM                  Functional ROM: Unrestricted ROM                  Muscle Tone/Strength: Functionally intact. No obvious neuro-muscular anomalies detected.  Muscle Tone/Strength: Functionally intact. No obvious neuro-muscular anomalies detected.  Sensory (Neurological): Unimpaired        Sensory (Neurological): Unimpaired        DTR: Patellar: deferred today Achilles: deferred today Plantar: deferred today  DTR: Patellar: deferred today Achilles: deferred today Plantar: deferred today  Palpation: No palpable anomalies  Palpation: No palpable anomalies   Assessment   Status Diagnosis  Controlled Controlled Controlled 1.  Facet arthropathy, lumbar   2. Chronic pain syndrome   3. Lumbar spondylosis   4. Spondylosis without myelopathy or radiculopathy, lumbar region   5. SI (sacroiliac) joint dysfunction   6. Bilateral leg pain   7. Chronic left shoulder pain   8. Long term prescription opiate use   9. Lumbar degenerative disc disease       Plan of Care  Pharmacotherapy (Medications Ordered): Meds ordered this encounter  Medications  . oxyCODONE-acetaminophen (PERCOCET) 10-325 MG tablet    Sig: Take 1 tablet by mouth every 8 (eight) hours as needed for pain. Must last 30 days.    Dispense:  90 tablet    Refill:  0    [place] STOP ACT - Not applicable. Fill one day early if pharmacy is closed on scheduled refill date.  [place] Kitchen oxyCODONE-acetaminophen (PERCOCET) 10-325 MG tablet    Sig: Take 1 tablet by mouth every 8 (eight) hours as needed for pain. Must last 30 days.    Dispense:  90 tablet    Refill:  0    [place] STOP ACT - Not applicable. Fill one day early if pharmacy is closed on scheduled refill date.  Marland Kitchen oxyCODONE-acetaminophen (PERCOCET) 10-325 MG tablet    Sig: Take 1 tablet by mouth every 8 (eight) hours as needed for pain. Must last 30 days.    Dispense:  90 tablet    Refill:  0    Paul STOP ACT - Not applicable. Fill one  day early if pharmacy is closed on scheduled refill date.   Medications administered today: Talik Casique. Jelinski had no medications administered during this visit.  Orders:  No orders of the defined types were placed in this encounter.  Planned follow-up:   Return in about 3 months (around 12/11/2019) for Medication Management, in person.     s/p b/l SI-J 12/02/2018- not helpful, plan for lumbar facets L3-S1 b/l       Recent Visits No visits were found meeting these conditions.  Showing recent visits within past 90 days and meeting all other requirements   Today's Visits Date Type Provider Dept  09/09/19 Office Visit Gillis Santa, MD [facility]  Showing today's visits and  meeting all other requirements   Future Appointments No visits were found meeting these conditions.  Showing future appointments within next 90 days and meeting all other requirements   Primary Care Physician: Kirk Ruths, MD Location: [facility] Note by: Gillis Santa, MD Date: 09/09/2019; Time: 9:00 AM  Note: This dictation was prepared with Dragon dictation. Any transcriptional errors that may result from this process are unintentional.

## 2019-09-12 DIAGNOSIS — M791 Myalgia, unspecified site: Secondary | ICD-10-CM | POA: Diagnosis not present

## 2019-09-12 DIAGNOSIS — G4733 Obstructive sleep apnea (adult) (pediatric): Secondary | ICD-10-CM | POA: Diagnosis not present

## 2019-09-12 DIAGNOSIS — Z0000 Encounter for general adult medical examination without abnormal findings: Secondary | ICD-10-CM | POA: Diagnosis not present

## 2019-09-12 DIAGNOSIS — I2510 Atherosclerotic heart disease of native coronary artery without angina pectoris: Secondary | ICD-10-CM | POA: Diagnosis not present

## 2019-09-12 DIAGNOSIS — T466X5A Adverse effect of antihyperlipidemic and antiarteriosclerotic drugs, initial encounter: Secondary | ICD-10-CM | POA: Diagnosis not present

## 2019-09-12 DIAGNOSIS — I700 Atherosclerosis of aorta: Secondary | ICD-10-CM | POA: Diagnosis not present

## 2019-09-12 DIAGNOSIS — I10 Essential (primary) hypertension: Secondary | ICD-10-CM | POA: Diagnosis not present

## 2019-09-12 DIAGNOSIS — E118 Type 2 diabetes mellitus with unspecified complications: Secondary | ICD-10-CM | POA: Diagnosis not present

## 2019-09-12 DIAGNOSIS — F325 Major depressive disorder, single episode, in full remission: Secondary | ICD-10-CM | POA: Diagnosis not present

## 2019-09-17 DIAGNOSIS — R945 Abnormal results of liver function studies: Secondary | ICD-10-CM | POA: Diagnosis not present

## 2019-10-03 ENCOUNTER — Ambulatory Visit: Payer: Self-pay

## 2019-10-22 DIAGNOSIS — E538 Deficiency of other specified B group vitamins: Secondary | ICD-10-CM | POA: Diagnosis not present

## 2019-11-05 ENCOUNTER — Telehealth: Payer: Self-pay

## 2019-11-05 NOTE — Telephone Encounter (Signed)
[facility] spoke w/ Verdene Lennert , no PA required for Eligard ref [phone]

## 2019-11-06 ENCOUNTER — Ambulatory Visit: Payer: Self-pay

## 2019-11-16 ENCOUNTER — Telehealth: Payer: Self-pay | Admitting: Urology

## 2019-11-16 NOTE — Telephone Encounter (Signed)
Please call Gary [name] and have him come in for his Eligard injection and PSA.

## 2019-11-24 ENCOUNTER — Ambulatory Visit (INDEPENDENT_AMBULATORY_CARE_PROVIDER_SITE_OTHER): Payer: PPO

## 2019-11-24 ENCOUNTER — Other Ambulatory Visit: Payer: Self-pay

## 2019-11-24 DIAGNOSIS — C61 Malignant neoplasm of prostate: Secondary | ICD-10-CM

## 2019-11-24 DIAGNOSIS — E538 Deficiency of other specified B group vitamins: Secondary | ICD-10-CM | POA: Diagnosis not present

## 2019-11-24 MED ORDER — LEUPROLIDE ACETATE (6 MONTH) 45 MG ~~LOC~~ KIT
45.0000 mg | PACK | Freq: Once | SUBCUTANEOUS | Status: AC
  Administered 2019-11-24: 45 mg via SUBCUTANEOUS

## 2019-11-24 NOTE — Progress Notes (Signed)
Eligard SubQ Injection   Due to Prostate Cancer patient is present today for a Eligard Injection.  Medication: Eligard 6 month Dose: 45 mg  Location: left  Lot: [id number] Exp: 05/10/2021  Patient tolerated well, no complications were noted  Performed by: Kerman Passey, RMA  Per Dr. Bernardo Heater patient is to continue therapy for 6 months . Patient's next follow up was scheduled for 05/31/2020. This appointment was scheduled using wheel and given to patient today along with reminder continue on Vitamin D 800-1000iu and Calium 1000-1200mg  daily while on Androgen Deprivation Therapy.

## 2019-11-25 ENCOUNTER — Telehealth: Payer: Self-pay | Admitting: Family Medicine

## 2019-11-25 LAB — PSA: Prostate Specific Ag, Serum: <0.1 ng/mL (ref 0.0–4.0)

## 2019-11-25 NOTE — Telephone Encounter (Signed)
-----   Message from Abbie Sons, MD sent at 11/25/2019  1:33 PM EDT ----- PSA undetectable <0.1.  Follow-up as scheduled

## 2019-11-25 NOTE — Telephone Encounter (Signed)
[facility] informed patient of lab result.

## 2019-11-27 ENCOUNTER — Telehealth: Payer: Self-pay

## 2019-11-27 NOTE — Telephone Encounter (Signed)
-----   Message from Abbie Sons, MD sent at 11/25/2019  4:19 PM EDT ----- Dr. Baruch Gouty had recommended ADT for 24 months. This injection puts him there so is his last ----- Message ----- From: Alvera Novel, CMA Sent: 11/24/2019   2:51 PM EDT To: Abbie Sons, MD  Patient is under the impression this would be his last Eligard injection. I could not find any information regarding that, please advise

## 2019-11-27 NOTE — Telephone Encounter (Signed)
Pt aware of instructions and psa results.

## 2019-12-09 ENCOUNTER — Other Ambulatory Visit: Payer: Self-pay

## 2019-12-09 ENCOUNTER — Ambulatory Visit
Payer: PPO | Attending: Student in an Organized Health Care Education/Training Program | Admitting: Student in an Organized Health Care Education/Training Program

## 2019-12-09 ENCOUNTER — Encounter: Payer: Self-pay | Admitting: Student in an Organized Health Care Education/Training Program

## 2019-12-09 VITALS — BP 142/95 | HR 91 | Temp 97.5°F | Resp 18 | Ht 68.0 in | Wt 210.0 lb

## 2019-12-09 DIAGNOSIS — M5416 Radiculopathy, lumbar region: Secondary | ICD-10-CM | POA: Insufficient documentation

## 2019-12-09 DIAGNOSIS — G8929 Other chronic pain: Secondary | ICD-10-CM | POA: Diagnosis not present

## 2019-12-09 DIAGNOSIS — M9973 Connective tissue and disc stenosis of intervertebral foramina of lumbar region: Secondary | ICD-10-CM | POA: Diagnosis not present

## 2019-12-09 DIAGNOSIS — G894 Chronic pain syndrome: Secondary | ICD-10-CM | POA: Insufficient documentation

## 2019-12-09 DIAGNOSIS — Z0289 Encounter for other administrative examinations: Secondary | ICD-10-CM | POA: Diagnosis not present

## 2019-12-09 DIAGNOSIS — M48062 Spinal stenosis, lumbar region with neurogenic claudication: Secondary | ICD-10-CM | POA: Diagnosis not present

## 2019-12-09 DIAGNOSIS — M48061 Spinal stenosis, lumbar region without neurogenic claudication: Secondary | ICD-10-CM

## 2019-12-09 MED ORDER — OXYCODONE-ACETAMINOPHEN 10-325 MG PO TABS: 1.0000 | ORAL_TABLET | Freq: Three times a day (TID) | ORAL | 0 refills | Status: AC | PRN

## 2019-12-09 MED ORDER — OXYCODONE-ACETAMINOPHEN 10-325 MG PO TABS: 1.0000 | ORAL_TABLET | Freq: Three times a day (TID) | ORAL | 0 refills | Status: DC | PRN

## 2019-12-09 NOTE — Progress Notes (Signed)
Nursing Pain Medication Assessment:  Safety precautions to be maintained throughout the outpatient stay will include: orient to surroundings, keep bed in low position, maintain call bell within reach at all times, provide assistance with transfer out of bed and ambulation.  Medication Inspection Compliance: Pill count conducted under aseptic conditions, in front of the patient. Neither the pills nor the bottle was removed from the patient's sight at any time. Once count was completed pills were immediately returned to the patient in their original bottle.  Medication: Oxycodone/APAP Pill/Patch Count: 30 of 90 pills remain Pill/Patch Appearance: Markings consistent with prescribed medication Bottle Appearance: Standard pharmacy container. Clearly labeled. Filled Date: 08 / 12 / 2021 Last Medication intake:  Today

## 2019-12-09 NOTE — Progress Notes (Signed)
PROVIDER NOTE: Information contained herein reflects review and annotations entered in association with encounter. Interpretation of such information and data should be left to medically-trained personnel. Information provided to patient can be located elsewhere in the medical record under "Patient Instructions". Document created using STT-dictation technology, any transcriptional errors that may result from process are unintentional.    Patient: Gary [name]  Service Category: E/M  Provider: Gillis Santa, MD  DOB: 1950-07-27  DOS: 12/09/2019  Specialty: Interventional Pain Management  MRN: [id number]  Setting: Ambulatory outpatient  PCP: Gary Ruths, MD  Type: Established Patient    Referring Provider: Kirk Ruths, MD  Location: Office  Delivery: Face-to-face     HPI  Reason for encounter: Gary [name], a 69 y.o. year old male, is here today for evaluation and management of his Spinal stenosis of lumbar region with neurogenic claudication [M48.062]. Gary [name] primary complain today is Back Pain (low) Last encounter: Practice (09/09/2019). My last encounter with him was on 09/09/2019. Pertinent problems: Gary [name] has Spondylosis without myelopathy or radiculopathy, lumbar region; Lumbar degenerative disc disease; Degenerative disc disease, cervical; Chronic pain syndrome; Facet arthropathy, lumbar; Spinal stenosis of lumbar region with neurogenic claudication; Chronic bilateral low back pain with left-sided sciatica; Chronic left sacroiliac joint pain; Chronic hip pain, left; Cervical spondylosis with radiculopathy; Groin pain, chronic, right; and Chronic left shoulder pain on their pertinent problem list. Pain Assessment: Severity of Chronic pain is reported as a 9 /10. Location: Back Lower/radiates into both legs in the back to the knee. Onset: More than a month ago. Quality: Stabbing. Timing: Constant. Modifying factor(s): nothing. Vitals:  height is '5\' 8"'  (1.727 m)  and weight is 210 lb (95.3 kg). His temperature is 97.5 F (36.4 C) (abnormal). His blood pressure is 142/95 (abnormal) and his pulse is 91. His respiration is 18 and oxygen saturation is 97%.   Ms. Gary [name] presents today for medication management and worsening low back pain that radiates into bilateral posterior legs usually down to his knees occasional radiation to bilateral calves.  He states that his pain is gotten worse.  He is having difficulty walking.  He is a primary caregiver of his wife who has multiple medical problems and requires frequent medical care.  Patient's previous lumbar MRI was in 2019 which I reviewed again.  He does have multilevel lumbar spinal stenosis and diffuse lumbar facet hypertrophy and ligamentum flavum hypertrophy most pronounced at L3-L4 and L4-L5 causing severe spinal stenosis.  Upon axial measurement of T2, his right ligamentum flavum at L4-L5 was approximately 5.5 mm in his left ligamentum flavum on T2 weighted axial at L4-L5 was approximately 3.5 mm.  Patient could be a potential candidate for mild: Minimally invasive lumbar decompression for lumbar spinal stenosis with neurogenic claudication.  Patient states that he will review this therapy further.  In the meantime I will obtain a repeat lumbar spine MRI given worsening symptomatology and then will discuss treatment plan which may include transforaminal/interlaminar epidural steroid injection, MILD.   Pharmacotherapy Assessment   11/20/2019  1   09/09/2019  Oxycodone-Acetaminophen 10-325  90.00  30 Bi Lat   [id number]   Thr (4801)   0/0  45.00 MME  Medicaid   Gary [name]     Monitoring: [place] PMP: PDMP reviewed during this encounter.       Pharmacotherapy: No side-effects or adverse reactions reported. Compliance: No problems identified. Effectiveness: Clinically acceptable.  Gary Shorter, RN  12/09/2019  9:06 AM  Signed Nursing  Pain Medication Assessment:  Safety precautions to be maintained throughout the outpatient  stay will include: orient to surroundings, keep bed in low position, maintain call bell within reach at all times, provide assistance with transfer out of bed and ambulation.  Medication Inspection Compliance: Pill count conducted under aseptic conditions, in front of the patient. Neither the pills nor the bottle was removed from the patient's sight at any time. Once count was completed pills were immediately returned to the patient in their original bottle.  Medication: Oxycodone/APAP Pill/Patch Count: 30 of 90 pills remain Pill/Patch Appearance: Markings consistent with prescribed medication Bottle Appearance: Standard pharmacy container. Clearly labeled. Filled Date: 08 / 12 / 2021 Last Medication intake:  Today    UDS:  Summary  Date Value Ref Range Status  09/04/2019 Note  Final    Comment:    ==================================================================== [facility] (MW) ==================================================================== Test                             Result       Flag       Units Drug Present and Declared for Prescription Verification   Oxycodone                      711          EXPECTED   ng/mg creat   Oxymorphone                    417          EXPECTED   ng/mg creat   Noroxycodone                   1073         EXPECTED   ng/mg creat   Noroxymorphone                 183          EXPECTED   ng/mg creat    Sources of oxycodone are scheduled prescription medications.    Oxymorphone, noroxycodone, and noroxymorphone are expected    metabolites of oxycodone. Oxymorphone is also available as a    scheduled prescription medication. ==================================================================== Test                      Result    Flag   Units      Ref Range   Creatinine              132              mg/dL      >=20 ==================================================================== Declared Medications:  The flagging and interpretation on  this report are based on the  following declared medications.  Unexpected results may arise from  inaccuracies in the declared medications.  **Note: The testing scope of this panel includes these medications:  Oxycodone (Percocet)  **Note: The testing scope of this panel does not include the  following reported medications:  Acetaminophen (Tylenol)  Acetaminophen (Percocet)  Albuterol  Aspirin  Atorvastatin (Lipitor)  Bupropion (Wellbutrin)  Buspirone (Buspar)  Duloxetine (Cymbalta)  Esomeprazole (Nexium)  Evolocumab (Repatha)  Ezetimibe (Zetia)  Fenofibrate (TriCor)  Finasteride (Proscar)  Gabapentin (Neurontin)  Hydrochlorothiazide (Hydrodiuril)  Ibuprofen (Advil)  Lovastatin  Meloxicam (Mobic)  Methocarbamol (Robaxin)  Niacin  Pravastatin (Pravachol)  Sildenafil  Tamsulosin (Flomax)  Topical  Topical Diclofenac  Vitamin D ==================================================================== For clinical consultation, please call [phone]. ====================================================================      ROS  Constitutional: Denies any fever or chills Gastrointestinal: No reported hemesis, hematochezia, vomiting, or acute GI distress Musculoskeletal: Low back pain, bilateral leg pain.  Worse with walking.  Improves with rest Neurological: No reported episodes of acute onset apraxia, aphasia, dysarthria, agnosia, amnesia, paralysis, loss of coordination, or loss of consciousness  Medication Review  Albuterol Sulfate, Bempedoic Acid, DULoxetine, acetaminophen, aspirin, atorvastatin, buPROPion, busPIRone, cholecalciferol, clobetasol ointment, clotrimazole-betamethasone, diclofenac sodium, esomeprazole, ezetimibe, fenofibrate, finasteride, gabapentin, hydrochlorothiazide, mupirocin ointment, oxyCODONE-acetaminophen, potassium chloride, sildenafil, and tamsulosin  History Review  Allergy: Gary [name] is allergic to niacin-lovastatin er, atorvastatin,  simvastatin, and propoxyphene. Drug: Gary [name]  reports no history of drug use. Alcohol:  reports no history of alcohol use. Tobacco:  reports that he quit smoking about 7 years ago. His smoking use included cigarettes. He has a 67.50 pack-year smoking history. He has never used smokeless tobacco. Social: Gary [name]  reports that he quit smoking about 7 years ago. His smoking use included cigarettes. He has a 67.50 pack-year smoking history. He has never used smokeless tobacco. He reports that he does not drink alcohol and does not use drugs. Medical:  has a past medical history of Anxiety, Benign essential HTN (06/24/2015), BPH (benign prostatic hyperplasia), COPD (chronic obstructive pulmonary disease) ([place]), Coronary artery disease (02/16/2014), DDD (degenerative disc disease), cervical, Degenerative disc disease, cervical (01/22/2017), Depression, DJD (degenerative joint disease), Emphysema of lung ([place]), Heel pain (10/10/2017), Hyperglobulinemia, Lumbar degenerative disc disease (01/22/2017), Major depression in remission ([place]) (03/10/2014), Migraines, Mitral regurgitation, Mixed hyperlipidemia (02/16/2014), Neck pain (01/24/2017), OSA (obstructive sleep apnea) (07/05/2016), Osteoarthritis of knee (08/12/2014), RA (rheumatoid arthritis) ([place]), Spinal stenosis of lumbar region with neurogenic claudication (04/18/2016), Spondylosis without myelopathy or radiculopathy, lumbar region (01/22/2017), and Venous insufficiency of both lower extremities (11/07/2016). Surgical: Gary [name]  has a past surgical history that includes Cholecystectomy; Knee surgery (Left); deviated septum repair; Cardiac catheterization; Colonoscopy; Nasal septum surgery; Colonoscopy with propofol (N/A, 01/09/2018); and Esophagogastroduodenoscopy (egd) with propofol (N/A, 01/09/2018). Family: family history includes Heart disease in his mother.  Laboratory Chemistry Profile   Renal No results found for: BUN, CREATININE, LABCREA, BCR, GFR,  GFRAA, GFRNONAA, LABVMA, EPIRU, [id number], NOREPRU, NOREPI24HUR, DOPARU, [id number]   Hepatic No results found for: AST, ALT, ALBUMIN, ALKPHOS, HCVAB, AMYLASE, LIPASE, AMMONIA   Electrolytes No results found for: NA, K, CL, CALCIUM, MG, PHOS   Bone Lab Results  Component Value Date   TESTOSTERONE <3 (L) 04/29/2018     Inflammation (CRP: Acute Phase) (ESR: Chronic Phase) No results found for: CRP, ESRSEDRATE, LATICACIDVEN     Note: Above Lab results reviewed.  Recent Imaging Review  CT CHEST LUNG CANCER SCREENING LOW DOSE WO CONTRAST CLINICAL DATA:  69 year old male with 60 pack-year history of smoking. Lung cancer screening.  EXAM: CT CHEST WITHOUT CONTRAST LOW-DOSE FOR LUNG CANCER SCREENING  TECHNIQUE: Multidetector CT imaging of the chest was performed following the standard protocol without IV contrast.  COMPARISON:  05/21/2018  FINDINGS: Cardiovascular: The heart size is normal. No substantial pericardial effusion. Atherosclerotic calcification is noted in the wall of the thoracic aorta.  Mediastinum/Nodes: No mediastinal lymphadenopathy. No evidence for gross hilar lymphadenopathy although assessment is limited by the lack of intravenous contrast on today's study. The esophagus has normal imaging features. There is no axillary lymphadenopathy.  Lungs/Pleura: Centrilobular emphsyema noted. Tiny perifissural nodules identified measuring up to maximum volume derived equivalent diameter of 2.4 mm. No suspicious nodule or mass. No focal airspace consolidation. No  pleural effusion.  Upper Abdomen: The liver shows diffusely decreased attenuation suggesting fat deposition.  Musculoskeletal: No worrisome lytic or sclerotic osseous abnormality.  IMPRESSION: Lung-RADS 2, benign appearance or behavior. Continue annual screening with low-dose chest CT without contrast in 12 months.  Hepatic steatosis.  Emphysema (ICD10-J43.9) and Aortic Atherosclerosis  (ICD10-170.0)  Electronically Signed   By: Misty Stanley M.D.   On: 07/08/2019 11:13 Note: Reviewed        Physical Exam  General appearance: Well nourished, well developed, and well hydrated. In no apparent acute distress Mental status: Alert, oriented x 3 (person, place, & time)       Respiratory: No evidence of acute respiratory distress Eyes: PERLA Vitals: BP (!) 142/95   Pulse 91   Temp (!) 97.5 F (36.4 C)   Resp 18   Ht '5\' 8"'  (1.727 m)   Wt 210 lb (95.3 kg)   SpO2 97%   BMI 31.93 kg/m  BMI: Estimated body mass index is 31.93 kg/m as calculated from the following:   Height as of this encounter: '5\' 8"'  (1.727 m).   Weight as of this encounter: 210 lb (95.3 kg). Ideal: Ideal body weight: 68.4 kg (150 lb 12.7 oz) Adjusted ideal body weight: 79.1 kg (174 lb 7.6 oz)  Lumbar Spine Area Exam  Skin & Axial Inspection: No masses, redness, or swelling Alignment: Symmetrical Functional ROM: Pain restricted ROM affecting both sides Stability: No instability detected Muscle Tone/Strength: Functionally intact. No obvious neuro-muscular anomalies detected. Sensory (Neurological): Neurogenic pain pattern Palpation: No palpable anomalies       Provocative Tests: Hyperextension/rotation test: (+) bilaterally for facet joint pain. Lumbar quadrant test (Kemp's test): (+) bilaterally for facet joint pain. Lateral bending test: (+) ipsilateral radicular pain, bilaterally. Positive for bilateral foraminal stenosis. Patrick's Maneuver: deferred today                   FABER* test: deferred today                   S-I anterior distraction/compression test: deferred today         S-I lateral compression test: deferred today         S-I Thigh-thrust test: deferred today         S-I Gaenslen's test: deferred today         *(Flexion, ABduction and External Rotation) Gait & Posture Assessment  Ambulation: Limited Gait: Antalgic Posture: Difficulty standing up straight, due to pain  Lower  Extremity Exam    Side: Right lower extremity  Side: Left lower extremity  Stability: No instability observed          Stability: No instability observed          Skin & Extremity Inspection: Skin color, temperature, and hair growth are WNL. No peripheral edema or cyanosis. No masses, redness, swelling, asymmetry, or associated skin lesions. No contractures.  Skin & Extremity Inspection: Skin color, temperature, and hair growth are WNL. No peripheral edema or cyanosis. No masses, redness, swelling, asymmetry, or associated skin lesions. No contractures.  Functional ROM: Pain restricted ROM for hip and knee joints          Functional ROM: Pain restricted ROM for hip and knee joints          Muscle Tone/Strength: Functionally intact. No obvious neuro-muscular anomalies detected.  Muscle Tone/Strength: Functionally intact. No obvious neuro-muscular anomalies detected.  Sensory (Neurological): Neurogenic pain pattern        Sensory (Neurological):  Neurogenic pain pattern        DTR: Patellar: deferred today Achilles: deferred today Plantar: deferred today  DTR: Patellar: deferred today Achilles: deferred today Plantar: deferred today  Palpation: No palpable anomalies  Palpation: No palpable anomalies    Assessment   Status Diagnosis  Controlled Controlled Controlled 1. Spinal stenosis of lumbar region with neurogenic claudication   2. Pain medication agreement signed   3. Chronic pain syndrome   4. Lumbar radiculopathy   5. Chronic radicular lumbar pain   6. Neuroforaminal stenosis of lumbar spine      Updated Problems: Problem  Chronic Left Shoulder Pain  Groin Pain, Chronic, Right  Cervical Spondylosis With Radiculopathy  Chronic Bilateral Low Back Pain With Left-Sided Sciatica  Chronic Left Sacroiliac Joint Pain  Chronic Hip Pain, Left  Spondylosis Without Myelopathy Or Radiculopathy, Lumbar Region  Lumbar Degenerative Disc Disease  Degenerative Disc Disease, Cervical  Facet  Arthropathy, Lumbar  Chronic Pain Syndrome  Spinal Stenosis of Lumbar Region With Neurogenic Claudication  Pain Medication Agreement Signed   [facility] Dr Holley Raring     Plan of Care  Gary [name] has a current medication list which includes the following long-term medication(s): albuterol sulfate, bupropion, duloxetine, esomeprazole, ezetimibe, fenofibrate, gabapentin, hydrochlorothiazide, potassium chloride, and atorvastatin.    Patient's previous lumbar MRI was in 2019 which I reviewed again.  He does have multilevel lumbar spinal stenosis and diffuse lumbar facet hypertrophy and ligamentum flavum hypertrophy most pronounced at L3-L4 and L4-L5 causing severe spinal stenosis.  Upon axial measurement of T2, his right ligamentum flavum at L4-L5 was approximately 5.5 mm in his left ligamentum flavum on T2 weighted axial at L4-L5 was approximately 3.5 mm.  Patient could be a potential candidate for mild: Minimally invasive lumbar decompression for lumbar spinal stenosis with neurogenic claudication.  Patient states that he will review this therapy further.  In the meantime I will obtain a repeat lumbar spine MRI given worsening symptomatology and then will discuss treatment plan which may include transforaminal/interlaminar epidural steroid injection, MILD.  1.  Lumbar MRI without contrast to evaluate multilevel lumbar spinal stenosis contributing to neurogenic claudication which I suspect is gotten worse. 2.  Treatment plan may include repeat interlaminar plus transforaminal epidural steroid injection or mild: Minimally invasive lumbar spine surgery as patient's axial T2 weighted MRI at L4-L5 shows ligamentum flavum hypertrophy greater than 2.5 mm with symptoms of neurogenic claudication and reduced walking and standing time. 3.  Refill oxycodone as below.  UDS up-to-date and appropriate.  PMP checked.  Continue multimodal analgesics as prescribed.  Pharmacotherapy (Medications Ordered): Meds  ordered this encounter  Medications  . oxyCODONE-acetaminophen (PERCOCET) 10-325 MG tablet    Sig: Take 1 tablet by mouth every 8 (eight) hours as needed for pain. Must last 30 days.    Dispense:  90 tablet    Refill:  0    [place] STOP ACT - Not applicable. Fill one day early if pharmacy is closed on scheduled refill date.  Mar[place] Kitchen oxyCODONE-acetaminophen (PERCOCET) 10-325 MG tablet    Sig: Take 1 tablet by mouth every 8 (eight) hours as needed for pain. Must last 30 days.    Dispense:  90 tablet    Refill:  0    [place] STOP ACT - Not applicable. Fill one day early if pharmacy is closed on scheduled refill date.  Marland Kitchen oxyCODONE-acetaminophen (PERCOCET) 10-325 MG tablet    Sig: Take 1 tablet by mouth every 8 (eight) hours as needed for pain.  Must last 30 days.    Dispense:  90 tablet    Refill:  0    [place] STOP ACT - Not applicable. Fill one day early if pharmacy is closed on scheduled refill date.   Orders:  Orders Placed This Encounter  Procedures  . MR LUMBAR SPINE WO CONTRAST    Patient presents with axial pain with possible radicular component.  In addition to any acute findings, please report on:  1. Facet (Zygapophyseal) joint DJD (Hypertrophy, space narrowing, subchondral sclerosis, and/or osteophyte formation) 2. DDD and/or IVDD (Loss of disc height, desiccation or "Black disc disease") 3. Pars defects 4. Spondylolisthesis, spondylosis, and/or spondyloarthropathies (include Degree/Grade of displacement in mm) 5. Vertebral body Fractures, including age (old, new/acute) 90. Modic Type Changes 7. Demineralization 8. Bone pathology 9. Central, Lateral Recess, and/or Foraminal Stenosis (include AP diameter of stenosis in mm) 10. Surgical changes (hardware type, status, and presence of fibrosis)  NOTE: Please specify level(s) and laterality.    Standing Status:   Future    Standing Expiration Date:   03/09/2020    Order Specific Question:   What is the patient's sedation requirement?    Answer:    No Sedation    Order Specific Question:   Does the patient have a pacemaker or implanted devices?    Answer:   No    Order Specific Question:   Preferred imaging location?    Answer:   [facility] (table limit-350lbs)    Order Specific Question:   Call Results- Best Contact Number?    Answer:   [phone] ([facility])    Order Specific Question:   Radiology Contrast Protocol - do NOT remove file path    Answer:   [email or web address]   Follow-up plan:   Return in about 3 months (around 03/09/2020) for Medication Management, in person.     s/p b/l SI-J 12/02/2018- not helpful, plan for lumbar facets L3-S1 b/l        Recent Visits No visits were found meeting these conditions. Showing recent visits within past 90 days and meeting all other requirements Today's Visits Date Type Provider Dept  12/09/19 Office Visit Gary Santa, MD [facility]  Showing today's visits and meeting all other requirements Future Appointments No visits were found meeting these conditions. Showing future appointments within next 90 days and meeting all other requirements  I discussed the assessment and treatment plan with the patient. The patient was provided an opportunity to ask questions and all were answered. The patient agreed with the plan and demonstrated an understanding of the instructions.  Patient advised to call back or seek an in-person evaluation if the symptoms or condition worsens.  Duration of encounter: 52mnutes.  Note by: BGillis Santa MD Date: 12/09/2019; Time: 9:35 AM

## 2019-12-26 ENCOUNTER — Telehealth: Payer: Self-pay | Admitting: Student in an Organized Health Care Education/Training Program

## 2019-12-26 NOTE — Telephone Encounter (Signed)
Called to inform patient that Dr Holley Raring would not be back in the office until Tuesday.  Left message.

## 2019-12-26 NOTE — Telephone Encounter (Signed)
[facility] needs [facility]. Did not get sent at last appt.

## 2019-12-30 MED ORDER — DULOXETINE HCL 60 MG PO CPEP: 60.0000 mg | ORAL_CAPSULE | Freq: Every day | ORAL | 5 refills | Status: DC

## 2019-12-30 NOTE — Addendum Note (Signed)
Addended by: Gillis Santa on: 12/30/2019 12:05 PM   Modules accepted: Orders

## 2020-02-18 DIAGNOSIS — M545 Low back pain, unspecified: Secondary | ICD-10-CM | POA: Diagnosis not present

## 2020-02-18 DIAGNOSIS — Z23 Encounter for immunization: Secondary | ICD-10-CM | POA: Diagnosis not present

## 2020-02-18 DIAGNOSIS — M5440 Lumbago with sciatica, unspecified side: Secondary | ICD-10-CM | POA: Diagnosis not present

## 2020-02-18 DIAGNOSIS — M1611 Unilateral primary osteoarthritis, right hip: Secondary | ICD-10-CM | POA: Diagnosis not present

## 2020-02-18 DIAGNOSIS — M1612 Unilateral primary osteoarthritis, left hip: Secondary | ICD-10-CM | POA: Diagnosis not present

## 2020-02-18 DIAGNOSIS — M542 Cervicalgia: Secondary | ICD-10-CM | POA: Diagnosis not present

## 2020-03-09 ENCOUNTER — Ambulatory Visit
Payer: PPO | Attending: Student in an Organized Health Care Education/Training Program | Admitting: Student in an Organized Health Care Education/Training Program

## 2020-03-09 ENCOUNTER — Other Ambulatory Visit: Payer: Self-pay

## 2020-03-09 ENCOUNTER — Encounter: Payer: Self-pay | Admitting: Student in an Organized Health Care Education/Training Program

## 2020-03-09 VITALS — BP 134/81 | HR 108 | Temp 97.3°F | Resp 18 | Ht 68.0 in | Wt 220.0 lb

## 2020-03-09 DIAGNOSIS — M47816 Spondylosis without myelopathy or radiculopathy, lumbar region: Secondary | ICD-10-CM | POA: Diagnosis not present

## 2020-03-09 DIAGNOSIS — M48062 Spinal stenosis, lumbar region with neurogenic claudication: Secondary | ICD-10-CM | POA: Diagnosis not present

## 2020-03-09 DIAGNOSIS — M533 Sacrococcygeal disorders, not elsewhere classified: Secondary | ICD-10-CM

## 2020-03-09 DIAGNOSIS — Z0289 Encounter for other administrative examinations: Secondary | ICD-10-CM | POA: Diagnosis not present

## 2020-03-09 DIAGNOSIS — G8929 Other chronic pain: Secondary | ICD-10-CM | POA: Diagnosis not present

## 2020-03-09 DIAGNOSIS — G894 Chronic pain syndrome: Secondary | ICD-10-CM

## 2020-03-09 DIAGNOSIS — M48061 Spinal stenosis, lumbar region without neurogenic claudication: Secondary | ICD-10-CM

## 2020-03-09 DIAGNOSIS — M5416 Radiculopathy, lumbar region: Secondary | ICD-10-CM

## 2020-03-09 DIAGNOSIS — M79605 Pain in left leg: Secondary | ICD-10-CM

## 2020-03-09 DIAGNOSIS — M79604 Pain in right leg: Secondary | ICD-10-CM | POA: Diagnosis not present

## 2020-03-09 MED ORDER — OXYCODONE-ACETAMINOPHEN 10-325 MG PO TABS: 1.0000 | ORAL_TABLET | Freq: Three times a day (TID) | ORAL | 0 refills | Status: AC | PRN

## 2020-03-09 MED ORDER — GABAPENTIN 300 MG PO CAPS: 600.0000 mg | ORAL_CAPSULE | Freq: Every day | ORAL | 4 refills | Status: DC

## 2020-03-09 MED ORDER — OXYCODONE-ACETAMINOPHEN 10-325 MG PO TABS: 1.0000 | ORAL_TABLET | Freq: Three times a day (TID) | ORAL | 0 refills | Status: DC | PRN

## 2020-03-09 NOTE — Progress Notes (Signed)
Nursing Pain Medication Assessment:  Safety precautions to be maintained throughout the outpatient stay will include: orient to surroundings, keep bed in low position, maintain call bell within reach at all times, provide assistance with transfer out of bed and ambulation.  Medication Inspection Compliance: Pill count conducted under aseptic conditions, in front of the patient. Neither the pills nor the bottle was removed from the patient's sight at any time. Once count was completed pills were immediately returned to the patient in their original bottle.  Medication: Oxycodone IR Pill/Patch Count: 39 of 90 pills remain Pill/Patch Appearance: Markings consistent with prescribed medication Bottle Appearance: Standard pharmacy container. Clearly labeled. Filled Date: 02/21/2020 Last Medication intake:  Today

## 2020-03-09 NOTE — Progress Notes (Signed)
PROVIDER NOTE: Information contained herein reflects review and annotations entered in association with encounter. Interpretation of such information and data should be left to medically-trained personnel. Information provided to patient can be located elsewhere in the medical record under "Patient Instructions". Document created using STT-dictation technology, any transcriptional errors that may result from process are unintentional.    Patient: Gary [name]  Service Category: E/M  Provider: Gillis Santa, MD  DOB: 1950-05-23  DOS: 03/09/2020  Specialty: Interventional Pain Management  MRN: [id number]  Setting: Ambulatory outpatient  PCP: Kirk Ruths, MD  Type: Established Patient    Referring Provider: Kirk Ruths, MD  Location: Office  Delivery: Face-to-face     HPI  [name], a 69 y.o. year old male, is here today because of his Pain medication agreement signed [Z02.89]. [name] primary complain today is Back Pain (lower) Last encounter: My last encounter with him was on 12/26/2019. Pertinent problems: [name] has Spondylosis without myelopathy or radiculopathy, lumbar region; Lumbar degenerative disc disease; Degenerative disc disease, cervical; Chronic pain syndrome; Facet arthropathy, lumbar; Spinal stenosis of lumbar region with neurogenic claudication; Chronic bilateral low back pain with left-sided sciatica; Chronic left sacroiliac joint pain; Chronic hip pain, left; Cervical spondylosis with radiculopathy; Groin pain, chronic, right; and Chronic left shoulder pain on their pertinent problem list. Pain Assessment: Severity of Chronic pain is reported as a 9 /10. Location: Back Lower/both legs. Onset: More than a month ago. Quality: Stabbing. Timing: Constant. Modifying factor(s): medications. Vitals:  height is '5\' 8"'  (1.727 m) and weight is 220 lb (99.8 kg). His temporal temperature is 97.3 F (36.3 C) (abnormal). His blood pressure is 134/81 and his  pulse is 108 (abnormal). His respiration is 18 and oxygen saturation is 98%.   Reason for encounter: medication management.   Patient sustained a fall in early November after he slipped on wet grass.  He had worsening of his low back, neck pain.  X-rays of his hip, cervical spine, lumbar spine did not show any acute fractures however he does have chronic degenerative changes as noted previously.  Patient is acute pain from his fall has resolved.  He continues to be the primary caregiver of his wife who is fairly debilitated.  He has to lift her up and help her with her ADLs.  He continues his oxycodone as prescribed, 10 mg 3 times daily as needed.  He also continues gabapentin 600 mg nightly as well as Cymbalta 60 mg daily.  Today we discussed Sprint peripheral nerve stimulation of lumbar medial branch for low back pain related to lumbar facet arthropathy.  Patient was provided with resources regarding peripheral nerve stimulation procedure.  He states that he would like to have this done after the new year.  I instructed the patient to contact our clinic when he is ready to proceed otherwise when he follows up for his next medication management visit, we will get him scheduled for Sprint peripheral nerve stimulation procedure of lumbar medial.  Pharmacotherapy Assessment   02/21/2020  12/09/2019   1  Oxycodone-Acetaminophen 10-325 90.00  30  [place]  [id number]  Thr (4878)  0/0  45.00 MME  Medicaid  [place]      Analgesic: Oxycodone 10 mg TID prn, #90/month, MME=45   Monitoring: [place] PMP: PDMP reviewed during this encounter.       Pharmacotherapy: No side-effects or adverse reactions reported. Compliance: No problems identified. Effectiveness: Clinically acceptable.  [name], RN  03/09/2020 11:25  AM  Sign when Signing Visit Nursing Pain Medication Assessment:  Safety precautions to be maintained throughout the outpatient stay will include: orient to surroundings, keep bed in low position,  maintain call bell within reach at all times, provide assistance with transfer out of bed and ambulation.  Medication Inspection Compliance: Pill count conducted under aseptic conditions, in front of the patient. Neither the pills nor the bottle was removed from the patient's sight at any time. Once count was completed pills were immediately returned to the patient in their original bottle.  Medication: Oxycodone IR Pill/Patch Count: 39 of 90 pills remain Pill/Patch Appearance: Markings consistent with prescribed medication Bottle Appearance: Standard pharmacy container. Clearly labeled. Filled Date: 02/21/2020 Last Medication intake:  Today    UDS:  Summary  Date Value Ref Range Status  09/04/2019 Note  Final    Comment:    ==================================================================== [facility] (MW) ==================================================================== Test                             Result       Flag       Units Drug Present and Declared for Prescription Verification   Oxycodone                      711          EXPECTED   ng/mg creat   Oxymorphone                    417          EXPECTED   ng/mg creat   Noroxycodone                   1073         EXPECTED   ng/mg creat   Noroxymorphone                 183          EXPECTED   ng/mg creat    Sources of oxycodone are scheduled prescription medications.    Oxymorphone, noroxycodone, and noroxymorphone are expected    metabolites of oxycodone. Oxymorphone is also available as a    scheduled prescription medication. ==================================================================== Test                      Result    Flag   Units      Ref Range   Creatinine              132              mg/dL      >=20 ==================================================================== Declared Medications:  The flagging and interpretation on this report are based on the  following declared medications.  Unexpected  results may arise from  inaccuracies in the declared medications.  **Note: The testing scope of this panel includes these medications:  Oxycodone (Percocet)  **Note: The testing scope of this panel does not include the  following reported medications:  Acetaminophen (Tylenol)  Acetaminophen (Percocet)  Albuterol  Aspirin  Atorvastatin (Lipitor)  Bupropion (Wellbutrin)  Buspirone (Buspar)  Duloxetine (Cymbalta)  Esomeprazole (Nexium)  Evolocumab (Repatha)  Ezetimibe (Zetia)  Fenofibrate (TriCor)  Finasteride (Proscar)  Gabapentin (Neurontin)  Hydrochlorothiazide (Hydrodiuril)  Ibuprofen (Advil)  Lovastatin  Meloxicam (Mobic)  Methocarbamol (Robaxin)  Niacin  Pravastatin (Pravachol)  Sildenafil  Tamsulosin (Flomax)  Topical  Topical Diclofenac  Vitamin D ==================================================================== For  clinical consultation, please call [phone]. ====================================================================      ROS  Constitutional: Denies any fever or chills Gastrointestinal: No reported hemesis, hematochezia, vomiting, or acute GI distress Musculoskeletal: Low back pain, bilateral hip pain Neurological: No reported episodes of acute onset apraxia, aphasia, dysarthria, agnosia, amnesia, paralysis, loss of coordination, or loss of consciousness  Medication Review  Albuterol Sulfate, Bempedoic Acid, DULoxetine, acetaminophen, buPROPion, busPIRone, cholecalciferol, clobetasol ointment, clotrimazole-betamethasone, diclofenac sodium, esomeprazole, ezetimibe, fenofibrate, finasteride, gabapentin, hydrochlorothiazide, multivitamin, mupirocin ointment, oxyCODONE-acetaminophen, potassium chloride, sildenafil, and tamsulosin  History Review  Allergy: [name] is allergic to niacin-lovastatin er, atorvastatin, simvastatin, and propoxyphene. Drug: [name]  reports no history of drug use. Alcohol:  reports no history of alcohol  use. Tobacco:  reports that he quit smoking about 7 years ago. His smoking use included cigarettes. He has a 67.50 pack-year smoking history. He has never used smokeless tobacco. Social: [name]  reports that he quit smoking about 7 years ago. His smoking use included cigarettes. He has a 67.50 pack-year smoking history. He has never used smokeless tobacco. He reports that he does not drink alcohol and does not use drugs. Medical:  has a past medical history of Anxiety, Benign essential HTN (06/24/2015), BPH (benign prostatic hyperplasia), COPD (chronic obstructive pulmonary disease) ([place]), Coronary artery disease (02/16/2014), DDD (degenerative disc disease), cervical, Degenerative disc disease, cervical (01/22/2017), Depression, DJD (degenerative joint disease), Emphysema of lung ([place]), Heel pain (10/10/2017), Hyperglobulinemia, Lumbar degenerative disc disease (01/22/2017), Major depression in remission ([place]) (03/10/2014), Migraines, Mitral regurgitation, Mixed hyperlipidemia (02/16/2014), Neck pain (01/24/2017), OSA (obstructive sleep apnea) (07/05/2016), Osteoarthritis of knee (08/12/2014), RA (rheumatoid arthritis) ([place]), Spinal stenosis of lumbar region with neurogenic claudication (04/18/2016), Spondylosis without myelopathy or radiculopathy, lumbar region (01/22/2017), and Venous insufficiency of both lower extremities (11/07/2016). Surgical: [name]  has a past surgical history that includes Cholecystectomy; Knee surgery (Left); deviated septum repair; Cardiac catheterization; Colonoscopy; Nasal septum surgery; Colonoscopy with propofol (N/A, 01/09/2018); and Esophagogastroduodenoscopy (egd) with propofol (N/A, 01/09/2018). Family: family history includes Heart disease in his mother.  I Laboratory Chemistry Profile   Renal No results found for: BUN, CREATININE, LABCREA, BCR, GFR, GFRAA, GFRNONAA, LABVMA, EPIRU, [id number], NOREPRU, [id number], DOPARU, [id number]   Hepatic No results found for:  AST, ALT, ALBUMIN, ALKPHOS, HCVAB, AMYLASE, LIPASE, AMMONIA   Electrolytes No results found for: NA, K, CL, CALCIUM, MG, PHOS   Bone Lab Results  Component Value Date   TESTOSTERONE <3 (L) 04/29/2018     Inflammation (CRP: Acute Phase) (ESR: Chronic Phase) No results found for: CRP, ESRSEDRATE, LATICACIDVEN     Note: Above Lab results reviewed.  Recent Imaging Review  CT CHEST LUNG CANCER SCREENING LOW DOSE WO CONTRAST CLINICAL DATA:  69 year old male with 60 pack-year history of smoking. Lung cancer screening.  EXAM: CT CHEST WITHOUT CONTRAST LOW-DOSE FOR LUNG CANCER SCREENING  TECHNIQUE: Multidetector CT imaging of the chest was performed following the standard protocol without IV contrast.  COMPARISON:  05/21/2018  FINDINGS: Cardiovascular: The heart size is normal. No substantial pericardial effusion. Atherosclerotic calcification is noted in the wall of the thoracic aorta.  Mediastinum/Nodes: No mediastinal lymphadenopathy. No evidence for gross hilar lymphadenopathy although assessment is limited by the lack of intravenous contrast on today's study. The esophagus has normal imaging features. There is no axillary lymphadenopathy.  Lungs/Pleura: Centrilobular emphsyema noted. Tiny perifissural nodules identified measuring up to maximum volume derived equivalent diameter of 2.4 mm. No suspicious nodule or mass. No focal airspace consolidation. No pleural effusion.  Upper  Abdomen: The liver shows diffusely decreased attenuation suggesting fat deposition.  Musculoskeletal: No worrisome lytic or sclerotic osseous abnormality.  IMPRESSION: Lung-RADS 2, benign appearance or behavior. Continue annual screening with low-dose chest CT without contrast in 12 months.  Hepatic steatosis.  Emphysema (ICD10-J43.9) and Aortic Atherosclerosis (ICD10-170.0)  Electronically Signed   By: Misty Stanley M.D.   On: 07/08/2019 11:13 Note: Reviewed        Physical Exam   General appearance: Well nourished, well developed, and well hydrated. In no apparent acute distress Mental status: Alert, oriented x 3 (person, place, & time)       Respiratory: No evidence of acute respiratory distress Eyes: PERLA Vitals: BP 134/81   Pulse (!) 108   Temp (!) 97.3 F (36.3 C) (Temporal)   Resp 18   Ht '5\' 8"'  (1.727 m)   Wt 220 lb (99.8 kg)   SpO2 98%   BMI 33.45 kg/m  BMI: Estimated body mass index is 33.45 kg/m as calculated from the following:   Height as of this encounter: '5\' 8"'  (1.727 m).   Weight as of this encounter: 220 lb (99.8 kg). Ideal: Ideal body weight: 68.4 kg (150 lb 12.7 oz) Adjusted ideal body weight: 81 kg (178 lb 7.6 oz)  Lumbar Spine Area Exam  Skin & Axial Inspection: No masses, redness, or swelling Alignment: Symmetrical Functional ROM: Pain restricted ROM affecting both sides Stability: No instability detected Muscle Tone/Strength: Functionally intact. No obvious neuro-muscular anomalies detected. Sensory (Neurological): Neurogenic pain pattern Palpation: No palpable anomalies       Provocative Tests: Hyperextension/rotation test: (+) bilaterally for facet joint pain. Lumbar quadrant test (Kemp's test): (+) bilaterally for facet joint pain. Lateral bending test: (+) ipsilateral radicular pain, bilaterally. Positive for bilateral foraminal stenosis. Patrick's Maneuver: deferred today                   FABER* test: deferred today                   S-I anterior distraction/compression test: deferred today         S-I lateral compression test: deferred today         S-I Thigh-thrust test: deferred today         S-I Gaenslen's test: deferred today         *(Flexion, ABduction and External Rotation)   Gait & Posture Assessment  Ambulation: Limited Gait: Antalgic Posture: Difficulty standing up straight, due to pain  Lower Extremity Exam    Side: Right lower extremity  Side: Left lower extremity  Stability: No instability observed           Stability: No instability observed          Skin & Extremity Inspection: Skin color, temperature, and hair growth are WNL. No peripheral edema or cyanosis. No masses, redness, swelling, asymmetry, or associated skin lesions. No contractures.  Skin & Extremity Inspection: Skin color, temperature, and hair growth are WNL. No peripheral edema or cyanosis. No masses, redness, swelling, asymmetry, or associated skin lesions. No contractures.  Functional ROM: Pain restricted ROM for hip and knee joints          Functional ROM: Pain restricted ROM for hip and knee joints          Muscle Tone/Strength: Functionally intact. No obvious neuro-muscular anomalies detected.  Muscle Tone/Strength: Functionally intact. No obvious neuro-muscular anomalies detected.  Sensory (Neurological): Neurogenic pain pattern        Sensory (Neurological): Neurogenic  pain pattern        DTR: Patellar: deferred today Achilles: deferred today Plantar: deferred today  DTR: Patellar: deferred today Achilles: deferred today Plantar: deferred today  Palpation: No palpable anomalies  Palpation: No palpable anomalies      Assessment   Status Diagnosis  Controlled Controlled Controlled 1. Pain medication agreement signed   2. Chronic pain syndrome   3. Spinal stenosis of lumbar region with neurogenic claudication   4. Lumbar radiculopathy   5. Chronic radicular lumbar pain   6. Neuroforaminal stenosis of lumbar spine   7. Facet arthropathy, lumbar   8. Lumbar spondylosis   9. Spondylosis without myelopathy or radiculopathy, lumbar region   10. SI (sacroiliac) joint dysfunction   11. Bilateral leg pain       Plan of Care  [name] has a current medication list which includes the following long-term medication(s): albuterol sulfate, bupropion, duloxetine, esomeprazole, ezetimibe, fenofibrate, gabapentin, hydrochlorothiazide, and potassium chloride.  Pharmacotherapy (Medications  Ordered): Meds ordered this encounter  Medications  . oxyCODONE-acetaminophen (PERCOCET) 10-325 MG tablet    Sig: Take 1 tablet by mouth every 8 (eight) hours as needed for pain. Must last 30 days.    Dispense:  90 tablet    Refill:  0    [place] STOP ACT - Not applicable. Fill one day early if pharmacy is closed on scheduled refill date.  Marland Kitchen oxyCODONE-acetaminophen (PERCOCET) 10-325 MG tablet    Sig: Take 1 tablet by mouth every 8 (eight) hours as needed for pain. Must last 30 days.    Dispense:  90 tablet    Refill:  0    [place] STOP ACT - Not applicable. Fill one day early if pharmacy is closed on scheduled refill date.  Marland Kitchen oxyCODONE-acetaminophen (PERCOCET) 10-325 MG tablet    Sig: Take 1 tablet by mouth every 8 (eight) hours as needed for pain. Must last 30 days.    Dispense:  90 tablet    Refill:  0    [place] STOP ACT - Not applicable. Fill one day early if pharmacy is closed on scheduled refill date.  . gabapentin (NEURONTIN) 300 MG capsule    Sig: Take 2 capsules (600 mg total) by mouth at bedtime.    Dispense:  60 capsule    Refill:  4  -Continue Cymbalta as prescribed, no refills needed. -Consider Sprint peripheral nerve stimulation of lumbar medial branch for lumbar facet arthropathy. -Encourage patient to be mindful of his posture and back position when he is helping his wife especially when he is lifting her.  Recommend that he consider back brace.  Follow-up plan:   Return in about 3 months (around 06/07/2020) for Medication Management, in person.     s/p b/l SI-J 12/02/2018- not helpful, plan for lumbar facets L3-S1 b/l; consider sprint peripheral nerve stimulation of lumbar medial branch lumbar facet arthropathy.       Recent Visits No visits were found meeting these conditions. Showing recent visits within past 90 days and meeting all other requirements Today's Visits Date Type Provider Dept  03/09/20 Office Visit Gillis Santa, MD [facility]  Showing today's visits and  meeting all other requirements Future Appointments No visits were found meeting these conditions. Showing future appointments within next 90 days and meeting all other requirements  I discussed the assessment and treatment plan with the patient. The patient was provided an opportunity to ask questions and all were answered. The patient agreed with the plan and demonstrated  an understanding of the instructions.  Patient advised to call back or seek an in-person evaluation if the symptoms or condition worsens.  Duration of encounter: 30 minutes.  Note by: Gillis Santa, MD Date: 03/09/2020; Time: 11:45 AM

## 2020-05-19 ENCOUNTER — Other Ambulatory Visit: Payer: Self-pay | Admitting: Internal Medicine

## 2020-05-19 DIAGNOSIS — R0789 Other chest pain: Secondary | ICD-10-CM

## 2020-05-31 ENCOUNTER — Ambulatory Visit: Payer: Self-pay | Admitting: Urology

## 2020-05-31 ENCOUNTER — Encounter: Payer: Self-pay | Admitting: Urology

## 2020-06-02 ENCOUNTER — Ambulatory Visit
Admission: RE | Admit: 2020-06-02 | Discharge: 2020-06-02 | Disposition: A | Discharge status: Home / Self Care | Payer: Medicare HMO | Source: Ambulatory Visit | Attending: Internal Medicine | Admitting: Internal Medicine

## 2020-06-02 ENCOUNTER — Other Ambulatory Visit: Payer: Self-pay

## 2020-06-02 DIAGNOSIS — R0789 Other chest pain: Secondary | ICD-10-CM | POA: Insufficient documentation

## 2020-06-02 HISTORY — DX: Unspecified asthma, uncomplicated: J45.909

## 2020-06-02 LAB — POCT I-STAT CREATININE: Creatinine, Ser: 0.9 mg/dL (ref 0.61–1.24)

## 2020-06-02 MED ORDER — IOHEXOL 300 MG/ML  SOLN
75.0000 mL | Freq: Once | INTRAMUSCULAR | Status: AC | PRN
  Administered 2020-06-02: 75 mL via INTRAVENOUS

## 2020-06-08 ENCOUNTER — Other Ambulatory Visit: Payer: Self-pay

## 2020-06-08 ENCOUNTER — Ambulatory Visit
Payer: Medicare HMO | Attending: Student in an Organized Health Care Education/Training Program | Admitting: Student in an Organized Health Care Education/Training Program

## 2020-06-08 ENCOUNTER — Encounter: Payer: Self-pay | Admitting: Student in an Organized Health Care Education/Training Program

## 2020-06-08 VITALS — BP 109/74 | HR 77 | Temp 96.9°F | Resp 18 | Ht 68.0 in | Wt 215.0 lb

## 2020-06-08 DIAGNOSIS — M47816 Spondylosis without myelopathy or radiculopathy, lumbar region: Secondary | ICD-10-CM | POA: Insufficient documentation

## 2020-06-08 DIAGNOSIS — G894 Chronic pain syndrome: Secondary | ICD-10-CM | POA: Diagnosis not present

## 2020-06-08 DIAGNOSIS — M48062 Spinal stenosis, lumbar region with neurogenic claudication: Secondary | ICD-10-CM | POA: Insufficient documentation

## 2020-06-08 MED ORDER — OXYCODONE-ACETAMINOPHEN 10-325 MG PO TABS: 1.0000 | ORAL_TABLET | Freq: Three times a day (TID) | ORAL | 0 refills | Status: AC | PRN

## 2020-06-08 MED ORDER — OXYCODONE-ACETAMINOPHEN 10-325 MG PO TABS: 1.0000 | ORAL_TABLET | Freq: Three times a day (TID) | ORAL | 0 refills | Status: DC | PRN

## 2020-06-08 NOTE — Progress Notes (Signed)
PROVIDER NOTE: Information contained herein reflects review and annotations entered in association with encounter. Interpretation of such information and data should be left to medically-trained personnel. Information provided to patient can be located elsewhere in the medical record under "Patient Instructions". Document created using STT-dictation technology, any transcriptional errors that may result from process are unintentional.    Patient: Gary [name]  Service Category: E/M  Provider: Gillis Santa, MD  DOB: 1950/06/07  DOS: 06/08/2020  Specialty: Interventional Pain Management  MRN: [id number]  Setting: Ambulatory outpatient  PCP: Kirk Ruths, MD  Type: Established Patient    Referring Provider: Kirk Ruths, MD  Location: Office  Delivery: Face-to-face     HPI  [name], a 70 y.o. year old male, is here today because of his Lumbar spondylosis [M47.816]. [name] primary complain today is Back Pain (lower) Last encounter: My last encounter with him was on 03/09/2020. Pertinent problems: [name] has Lumbar spondylosis; Lumbar degenerative disc disease; Degenerative disc disease, cervical; Chronic pain syndrome; Facet arthropathy, lumbar; Spinal stenosis of lumbar region with neurogenic claudication; Chronic bilateral low back pain with left-sided sciatica; Chronic left sacroiliac joint pain; Chronic hip pain, left; Cervical spondylosis with radiculopathy; Groin pain, chronic, right; and Chronic left shoulder pain on their pertinent problem list. Pain Assessment: Severity of Chronic pain is reported as a 7 /10. Location: Back Lower/both upper legs. Onset: More than a month ago. Quality: Constant. Timing: Constant. Modifying factor(s): medications. Vitals:  height is '5\' 8"'  (1.727 m) and weight is 215 lb (97.5 kg). His temporal temperature is 96.9 F (36.1 C) (abnormal). His blood pressure is 109/74 and his pulse is 77. His respiration is 18 and oxygen  saturation is 97%.   Reason for encounter: medication management.    Patient states that he may have sprained his left rib as he was reaching for an object.  He has had x-rays done which were unremarkable.  CT of his chest showed bilateral chronic rib fractures, nothing acute.  His acute left thoracic/intercostal pain is improving.  He is a primary caregiver of his wife who is fairly deconditioned and immobile.  He has chronic low back pain related to lumbar degenerative disc disease, lumbar facet arthropathy, lumbar spondylosis.  He has had short-term benefit, approximately 75% for 3 to 4 days with diagnostic lumbar facet medial branch nerve blocks.  We have discussed pursuing peripheral nerve stimulation of lumbar medial branch nerves for the purpose of obtaining longer-term benefit.  Risks and benefits of this procedure were discussed in great detail today.  Patient states that he will think about this further.  Otherwise we will refill his oxycodone as below.  No change in dose.  UDS up-to-date and appropriate.  Pharmacotherapy Assessment   Analgesic: Oxycodone 10 mg TID prn, #90/month, MME=45   Monitoring: [place] PMP: PDMP reviewed during this encounter.       Pharmacotherapy: No side-effects or adverse reactions reported. Compliance: No problems identified. Effectiveness: Clinically acceptable.  [name], RN  06/08/2020  9:02 AM  Sign when Signing Visit Nursing Pain Medication Assessment:  Safety precautions to be maintained throughout the outpatient stay will include: orient to surroundings, keep bed in low position, maintain call bell within reach at all times, provide assistance with transfer out of bed and ambulation.  Medication Inspection Compliance: Pill count conducted under aseptic conditions, in front of the patient. Neither the pills nor the bottle was removed from the patient's sight at any time. Once count  was completed pills were immediately returned to the patient in their  original bottle.  Medication: Oxycodone IR Pill/Patch Count: 80 of 90 pills remain Pill/Patch Appearance: Markings consistent with prescribed medication Bottle Appearance: Standard pharmacy container. Clearly labeled. Filled Date: 06/05/2020 Last Medication intake:  Today    UDS:  Summary  Date Value Ref Range Status  09/04/2019 Note  Final    Comment:    ==================================================================== [facility] (MW) ==================================================================== Test                             Result       Flag       Units Drug Present and Declared for Prescription Verification   Oxycodone                      711          EXPECTED   ng/mg creat   Oxymorphone                    417          EXPECTED   ng/mg creat   Noroxycodone                   1073         EXPECTED   ng/mg creat   Noroxymorphone                 183          EXPECTED   ng/mg creat    Sources of oxycodone are scheduled prescription medications.    Oxymorphone, noroxycodone, and noroxymorphone are expected    metabolites of oxycodone. Oxymorphone is also available as a    scheduled prescription medication. ==================================================================== Test                      Result    Flag   Units      Ref Range   Creatinine              132              mg/dL      >=20 ==================================================================== Declared Medications:  The flagging and interpretation on this report are based on the  following declared medications.  Unexpected results may arise from  inaccuracies in the declared medications.  **Note: The testing scope of this panel includes these medications:  Oxycodone (Percocet)  **Note: The testing scope of this panel does not include the  following reported medications:  Acetaminophen (Tylenol)  Acetaminophen (Percocet)  Albuterol  Aspirin  Atorvastatin (Lipitor)  Bupropion  (Wellbutrin)  Buspirone (Buspar)  Duloxetine (Cymbalta)  Esomeprazole (Nexium)  Evolocumab (Repatha)  Ezetimibe (Zetia)  Fenofibrate (TriCor)  Finasteride (Proscar)  Gabapentin (Neurontin)  Hydrochlorothiazide (Hydrodiuril)  Ibuprofen (Advil)  Lovastatin  Meloxicam (Mobic)  Methocarbamol (Robaxin)  Niacin  Pravastatin (Pravachol)  Sildenafil  Tamsulosin (Flomax)  Topical  Topical Diclofenac  Vitamin D ==================================================================== For clinical consultation, please call [phone]. ====================================================================      ROS  Constitutional: Denies any fever or chills Gastrointestinal: No reported hemesis, hematochezia, vomiting, or acute GI distress Musculoskeletal: Denies any acute onset joint swelling, redness, loss of ROM, or weakness Neurological: No reported episodes of acute onset apraxia, aphasia, dysarthria, agnosia, amnesia, paralysis, loss of coordination, or loss of consciousness  Medication Review  Albuterol Sulfate, Bempedoic Acid, DULoxetine, acetaminophen, buPROPion, busPIRone, cholecalciferol, clobetasol ointment, clotrimazole-betamethasone,  diclofenac sodium, esomeprazole, ezetimibe, fenofibrate, finasteride, gabapentin, hydrochlorothiazide, multivitamin, mupirocin ointment, oxyCODONE-acetaminophen, potassium chloride, sildenafil, and tamsulosin  History Review  Allergy: [name] is allergic to niacin-lovastatin er, atorvastatin, simvastatin, and propoxyphene. Drug: [name]  reports no history of drug use. Alcohol:  reports no history of alcohol use. Tobacco:  reports that he quit smoking about 8 years ago. His smoking use included cigarettes. He has a 67.50 pack-year smoking history. He has never used smokeless tobacco. Social: [name]  reports that he quit smoking about 8 years ago. His smoking use included cigarettes. He has a 67.50 pack-year smoking history. He has  never used smokeless tobacco. He reports that he does not drink alcohol and does not use drugs. Medical:  has a past medical history of Anxiety, Asthma, Benign essential HTN (06/24/2015), BPH (benign prostatic hyperplasia), COPD (chronic obstructive pulmonary disease) ([place]), Coronary artery disease (02/16/2014), DDD (degenerative disc disease), cervical, Degenerative disc disease, cervical (01/22/2017), Depression, DJD (degenerative joint disease), Emphysema of lung ([place]), Heel pain (10/10/2017), Hyperglobulinemia, Lumbar degenerative disc disease (01/22/2017), Major depression in remission ([place]) (03/10/2014), Migraines, Mitral regurgitation, Mixed hyperlipidemia (02/16/2014), Neck pain (01/24/2017), OSA (obstructive sleep apnea) (07/05/2016), Osteoarthritis of knee (08/12/2014), Prostate cancer ([place]) (2019), RA (rheumatoid arthritis) ([place]), Spinal stenosis of lumbar region with neurogenic claudication (04/18/2016), Spondylosis without myelopathy or radiculopathy, lumbar region (01/22/2017), and Venous insufficiency of both lower extremities (11/07/2016). Surgical: [name]  has a past surgical history that includes Cholecystectomy; Knee surgery (Left); deviated septum repair; Cardiac catheterization; Colonoscopy; Nasal septum surgery; Colonoscopy with propofol (N/A, 01/09/2018); and Esophagogastroduodenoscopy (egd) with propofol (N/A, 01/09/2018). Family: family history includes Heart disease in his mother.  Laboratory Chemistry Profile   Renal Lab Results  Component Value Date   CREATININE 0.90 06/02/2020     Hepatic No results found for: AST, ALT, ALBUMIN, ALKPHOS, HCVAB, AMYLASE, LIPASE, AMMONIA   Electrolytes No results found for: NA, K, CL, CALCIUM, MG, PHOS   Bone Lab Results  Component Value Date   TESTOSTERONE <3 (L) 04/29/2018     Inflammation (CRP: Acute Phase) (ESR: Chronic Phase) No results found for: CRP, ESRSEDRATE, LATICACIDVEN     Note: Above Lab results reviewed.  Recent Imaging  Review  CT CHEST W CONTRAST CLINICAL DATA:  Bilateral chest pain for 1.5 months, intermittent shortness of breath, orthopnea  EXAM: CT CHEST WITH CONTRAST  TECHNIQUE: Multidetector CT imaging of the chest was performed during intravenous contrast administration.  CONTRAST:  47m OMNIPAQUE IOHEXOL 300 MG/ML  SOLN  COMPARISON:  07/08/2019  FINDINGS: Cardiovascular: The heart and great vessels are unremarkable without pericardial effusion. Normal caliber of the thoracic aorta. Minimal atherosclerosis of the aortic arch and coronary vasculature.  Mediastinum/Nodes: No enlarged mediastinal, hilar, or axillary lymph nodes. Thyroid gland, trachea, and esophagus demonstrate no significant findings.  Lungs/Pleura: No acute airspace disease, effusion, or pneumothorax. Central airways are widely patent.  Upper Abdomen: No acute abnormality. Decreased attenuation of the liver consistent with hepatic steatosis.  Musculoskeletal: Chronic appearing rib fractures are seen at the bilateral sixth ribs and right seventh rib. Reconstructed images demonstrate no additional findings.  IMPRESSION: 1. No acute intrathoracic process. 2.  Aortic Atherosclerosis (ICD10-I70.0). 3. Hepatic steatosis. 4. Chronic appearing bilateral rib fractures as above.  Electronically Signed   By: MRanda NgoM.D.   On: 06/03/2020 22:19 Note: Reviewed        Physical Exam  General appearance: Well nourished, well developed, and well hydrated. In no apparent acute distress Mental status: Alert, oriented x 3 (  person, place, & time)       Respiratory: No evidence of acute respiratory distress Eyes: PERLA Vitals: BP 109/74   Pulse 77   Temp (!) 96.9 F (36.1 C) (Temporal)   Resp 18   Ht '5\' 8"'  (1.727 m)   Wt 215 lb (97.5 kg)   SpO2 97%   BMI 32.69 kg/m  BMI: Estimated body mass index is 32.69 kg/m as calculated from the following:   Height as of this encounter: '5\' 8"'  (1.727 m).   Weight as of this  encounter: 215 lb (97.5 kg). Ideal: Ideal body weight: 68.4 kg (150 lb 12.7 oz) Adjusted ideal body weight: 80 kg (176 lb 7.6 oz)  Lumbar Spine Area Exam  Skin & Axial Inspection:No masses, redness, or swelling Alignment:Symmetrical Functional FBP:ZWCH restricted ROMaffecting both sides Stability:No instability detected Muscle Tone/Strength:Functionally intact. No obvious neuro-muscular anomalies detected. Sensory (Neurological):Neurogenic pain pattern Palpation:No palpable anomalies Provocative Tests: Hyperextension/rotation test:(+)bilaterally for facet joint pain. Lumbar quadrant test (Kemp's test):(+)bilaterally for facet joint pain.  Lower Extremity Exam    Side:Right lower extremity  Side:Left lower extremity  Stability:No instability observed  Stability:No instability observed  Skin & Extremity Inspection:Skin color, temperature, and hair growth are WNL. No peripheral edema or cyanosis. No masses, redness, swelling, asymmetry, or associated skin lesions. No contractures.  Skin & Extremity Inspection:Skin color, temperature, and hair growth are WNL. No peripheral edema or cyanosis. No masses, redness, swelling, asymmetry, or associated skin lesions. No contractures.  Functional ENI:DPOE restricted ROMfor hip and knee joints   Functional UMP:NTIR restricted ROMfor hip and knee joints   Muscle Tone/Strength:Functionally intact. No obvious neuro-muscular anomalies detected.  Muscle Tone/Strength:Functionally intact. No obvious neuro-muscular anomalies detected.  Sensory (Neurological):Neurogenic pain pattern  Sensory (Neurological):Neurogenic pain pattern  DTR: Patellar:deferred today Achilles:deferred today Plantar:deferred today  DTR: Patellar:deferred today Achilles:deferred today Plantar:deferred today  Palpation:No palpable anomalies  Palpation:No palpable anomalies    5 out of 5  strength bilateral lower extremity: Plantar flexion, dorsiflexion, knee flexion, knee extension.  Assessment   Status Diagnosis  Persistent Persistent Persistent 1. Lumbar spondylosis   2. Facet arthropathy, lumbar   3. Chronic pain syndrome   4. Spinal stenosis of lumbar region with neurogenic claudication       Plan of Care   [name] has a current medication list which includes the following long-term medication(s): albuterol sulfate, bupropion, duloxetine, esomeprazole, ezetimibe, fenofibrate, gabapentin, hydrochlorothiazide, and potassium chloride.  Pharmacotherapy (Medications Ordered): Meds ordered this encounter  Medications  . oxyCODONE-acetaminophen (PERCOCET) 10-325 MG tablet    Sig: Take 1 tablet by mouth every 8 (eight) hours as needed for pain. Must last 30 days.    Dispense:  90 tablet    Refill:  0    [place] STOP ACT - Not applicable. Fill one day early if pharmacy is closed on scheduled refill date.  Marland Kitchen oxyCODONE-acetaminophen (PERCOCET) 10-325 MG tablet    Sig: Take 1 tablet by mouth every 8 (eight) hours as needed for pain. Must last 30 days.    Dispense:  90 tablet    Refill:  0    [place] STOP ACT - Not applicable. Fill one day early if pharmacy is closed on scheduled refill date.  Marland Kitchen oxyCODONE-acetaminophen (PERCOCET) 10-325 MG tablet    Sig: Take 1 tablet by mouth every 8 (eight) hours as needed for pain. Must last 30 days.    Dispense:  90 tablet    Refill:  0    [place] STOP ACT -  Not applicable. Fill one day early if pharmacy is closed on scheduled refill date.   Orders:  Orders Placed This Encounter  Procedures  . Peripheral Nerve Stimulation    Standing Status:   Future    Standing Expiration Date:   12/09/2020    Scheduling Instructions:     SPRINT PNS lumbar medial branch    Order Specific Question:   Where will this procedure be performed?    Answer:   [facility]   Follow-up plan:   Return in about 4 months (around 09/23/2020) for  Medication Management, in person.     s/p b/l SI-J 12/02/2018- not helpful,  lumbar facets L3-S1 b/l; consider sprint peripheral nerve stimulation of lumbar medial branch lumbar facet arthropathy.        Recent Visits No visits were found meeting these conditions. Showing recent visits within past 90 days and meeting all other requirements Today's Visits Date Type Provider Dept  06/08/20 Office Visit Gillis Santa, MD [facility]  Showing today's visits and meeting all other requirements Future Appointments No visits were found meeting these conditions. Showing future appointments within next 90 days and meeting all other requirements  I discussed the assessment and treatment plan with the patient. The patient was provided an opportunity to ask questions and all were answered. The patient agreed with the plan and demonstrated an understanding of the instructions.  Patient advised to call back or seek an in-person evaluation if the symptoms or condition worsens.  Duration of encounter:30 minutes.  Note by: Gillis Santa, MD Date: 06/08/2020; Time: 10:03 AM

## 2020-06-08 NOTE — Progress Notes (Signed)
Nursing Pain Medication Assessment:  Safety precautions to be maintained throughout the outpatient stay will include: orient to surroundings, keep bed in low position, maintain call bell within reach at all times, provide assistance with transfer out of bed and ambulation.  Medication Inspection Compliance: Pill count conducted under aseptic conditions, in front of the patient. Neither the pills nor the bottle was removed from the patient's sight at any time. Once count was completed pills were immediately returned to the patient in their original bottle.  Medication: Oxycodone IR Pill/Patch Count: 80 of 90 pills remain Pill/Patch Appearance: Markings consistent with prescribed medication Bottle Appearance: Standard pharmacy container. Clearly labeled. Filled Date: 06/05/2020 Last Medication intake:  Today

## 2020-06-17 ENCOUNTER — Telehealth: Payer: Self-pay | Admitting: *Deleted

## 2020-06-17 NOTE — Telephone Encounter (Signed)
Scheduled for lung screening CT 07/22/2020 1:15 PM Insurance changed to [employer], quit smoking 10 years ago.

## 2020-06-18 ENCOUNTER — Inpatient Hospital Stay: Payer: Medicare HMO | Attending: Radiation Oncology

## 2020-06-18 ENCOUNTER — Other Ambulatory Visit: Payer: Self-pay | Admitting: *Deleted

## 2020-06-18 DIAGNOSIS — Z87891 Personal history of nicotine dependence: Secondary | ICD-10-CM

## 2020-06-18 DIAGNOSIS — C61 Malignant neoplasm of prostate: Secondary | ICD-10-CM | POA: Insufficient documentation

## 2020-06-18 DIAGNOSIS — Z122 Encounter for screening for malignant neoplasm of respiratory organs: Secondary | ICD-10-CM

## 2020-06-18 NOTE — Progress Notes (Signed)
Contacted and scheduled for annual lung screening scan. Patient is a former smoker, quit 2014, 67.5 pack year history.

## 2020-06-21 ENCOUNTER — Other Ambulatory Visit: Payer: Self-pay | Admitting: *Deleted

## 2020-06-21 ENCOUNTER — Other Ambulatory Visit: Payer: Self-pay | Admitting: Student in an Organized Health Care Education/Training Program

## 2020-06-21 ENCOUNTER — Ambulatory Visit: Payer: Medicare HMO | Admitting: Radiation Oncology

## 2020-06-22 ENCOUNTER — Inpatient Hospital Stay: Payer: Medicare HMO

## 2020-06-22 DIAGNOSIS — C61 Malignant neoplasm of prostate: Secondary | ICD-10-CM

## 2020-06-22 LAB — PSA: Prostatic Specific Antigen: <0.01 ng/mL (ref 0.00–4.00)

## 2020-07-02 ENCOUNTER — Ambulatory Visit: Payer: Medicare HMO | Admitting: Radiation Oncology

## 2020-07-21 ENCOUNTER — Ambulatory Visit
Admission: RE | Admit: 2020-07-21 | Discharge: 2020-07-21 | Disposition: A | Discharge status: Home / Self Care | Payer: Medicare HMO | Source: Ambulatory Visit | Attending: Radiation Oncology | Admitting: Radiation Oncology

## 2020-07-21 ENCOUNTER — Encounter: Payer: Self-pay | Admitting: Radiation Oncology

## 2020-07-21 ENCOUNTER — Other Ambulatory Visit: Payer: Self-pay

## 2020-07-21 VITALS — BP 129/80 | HR 80 | Temp 96.5°F | Wt 209.0 lb

## 2020-07-21 DIAGNOSIS — C61 Malignant neoplasm of prostate: Secondary | ICD-10-CM | POA: Diagnosis not present

## 2020-07-21 DIAGNOSIS — R351 Nocturia: Secondary | ICD-10-CM | POA: Diagnosis not present

## 2020-07-21 DIAGNOSIS — Z923 Personal history of irradiation: Secondary | ICD-10-CM | POA: Insufficient documentation

## 2020-07-21 NOTE — Progress Notes (Signed)
Radiation Oncology Follow up Note  Name: Gary [name]   Date:   07/21/2020 MRN:  [id number] DOB: 1951/03/29    This 70 y.o. male presents to the clinic today for 2-year follow-up status post IMRT radiation therapy for stage IIb Gleason 7 (4+3) adenocarcinoma.  REFERRING PROVIDER: Kirk Ruths, MD  HPI: Patient is a 70 year old male now at 2 years having completed IMRT radiation therapy for stage IIb Gleason 7 (4+3) adenocarcinoma presenting with a PSA of.  1.3 on finasteride.  He is seen today in routine follow-up doing well specifically denies any increased lower urinary tract symptoms fatigue.  He is on Flomax does have some nocturia x2-3.  His recent PSA is less than 0.01.  He had a CT scan back in February showing no evidence of pathology.  COMPLICATIONS OF TREATMENT: none  FOLLOW UP COMPLIANCE: keeps appointments   PHYSICAL EXAM:  BP 129/80   Pulse 80   Temp (!) 96.5 F (35.8 C) (Tympanic)   Wt 209 lb (94.8 kg)   BMI 31.78 kg/m  Well-developed well-nourished patient in NAD. HEENT reveals PERLA, EOMI, discs not visualized.  Oral cavity is clear. No oral mucosal lesions are identified. Neck is clear without evidence of cervical or supraclavicular adenopathy. Lungs are clear to A&P. Cardiac examination is essentially unremarkable with regular rate and rhythm without murmur rub or thrill. Abdomen is benign with no organomegaly or masses noted. Motor sensory and DTR levels are equal and symmetric in the upper and lower extremities. Cranial nerves II through XII are grossly intact. Proprioception is intact. No peripheral adenopathy or edema is identified. No motor or sensory levels are noted. Crude visual fields are within normal range.  RADIOLOGY RESULTS: No current films for review  PLAN: Present time patient is doing well under excellent biochemical control of his prostate cancer he is having some sweating of suggested some vitamin D.  He has not been on ADT therapy for  over a year.  I have asked to see him back in 1 year for follow-up with his PSA at that time.  Patient knows to call with any concerns.  I would like to take this opportunity to thank you for allowing me to participate in the care of your patient.Noreene Filbert, MD

## 2020-07-22 ENCOUNTER — Other Ambulatory Visit: Payer: Self-pay | Admitting: Radiation Oncology

## 2020-07-22 ENCOUNTER — Ambulatory Visit: Admission: RE | Admit: 2020-07-22 | Payer: Medicare HMO | Source: Ambulatory Visit

## 2020-07-27 ENCOUNTER — Ambulatory Visit
Admission: RE | Admit: 2020-07-27 | Discharge: 2020-07-27 | Disposition: A | Discharge status: Home / Self Care | Payer: Medicare HMO | Source: Ambulatory Visit | Attending: Nurse Practitioner | Admitting: Nurse Practitioner

## 2020-07-27 ENCOUNTER — Other Ambulatory Visit: Payer: Self-pay

## 2020-07-27 DIAGNOSIS — Z87891 Personal history of nicotine dependence: Secondary | ICD-10-CM | POA: Diagnosis not present

## 2020-07-27 DIAGNOSIS — Z122 Encounter for screening for malignant neoplasm of respiratory organs: Secondary | ICD-10-CM | POA: Insufficient documentation

## 2020-08-02 ENCOUNTER — Telehealth: Payer: Self-pay | Admitting: Student in an Organized Health Care Education/Training Program

## 2020-08-03 ENCOUNTER — Telehealth: Payer: Self-pay | Admitting: Student in an Organized Health Care Education/Training Program

## 2020-08-03 NOTE — Telephone Encounter (Signed)
Patient was here Monday  4-25 with wife and wanted a msg sent to Dr. Holley Raring about having pain behind his knees. Please call patient to assess what he needs.

## 2020-08-04 ENCOUNTER — Encounter: Payer: Self-pay | Admitting: *Deleted

## 2020-08-04 ENCOUNTER — Telehealth: Payer: Self-pay | Admitting: *Deleted

## 2020-08-04 ENCOUNTER — Other Ambulatory Visit: Payer: Self-pay | Admitting: Pain Medicine

## 2020-08-04 DIAGNOSIS — G894 Chronic pain syndrome: Secondary | ICD-10-CM

## 2020-08-04 MED ORDER — DULOXETINE HCL 60 MG PO CPEP: 60.0000 mg | ORAL_CAPSULE | Freq: Every day | ORAL | 0 refills | Status: DC

## 2020-08-04 NOTE — Telephone Encounter (Signed)
Patient called in and told me that he thinks I have him mixed up with his wife, that she is the one taking duloxetine, he takes Cymbalta (he had asked for refill this morning).  I told him that this is the same medication.  Then he states well then Gary [name] is taking both of them.  I did check her chart and she does have duloxetine (cymbalta) 60 mg prescribed.  [name] responded, "and she is taking both of them".  When I tried to explain that most medications have 2 names and that perhaps she had 2 different bottles one having duloxetine and one having cymbalta on the label she may be taking both but should only be taking as prescribed which is 60 mg daily.  Patient verbalizes u/o information.

## 2020-08-04 NOTE — Progress Notes (Unsigned)
Dr. Dossie Arbour covering for Dr. Gillis Santa.  Patient ran out of Cymbalta.  I will give him a 30-day supply and ask the staff to schedule him to return with Dr. Holley Raring for follow-up and further refills if needed.

## 2020-08-04 NOTE — Telephone Encounter (Signed)
Left voicemail with patient that Dr Holley Raring is out of the office but that I would send the request to DR Dossie Arbour since he was just seen in March and perhaps Dr Holley Raring just overlooked resending that in.  I did tell him this would be going to [facility].    Bubble sent to DR Dossie Arbour.

## 2020-08-04 NOTE — Telephone Encounter (Signed)
Called patient back to let him know that Dr Dossie Arbour has sent in 1 month and he feels that he will need an appt with Dr Holley Raring to discuss benefits of medicine.  I will leave Dr Holley Raring a message to let him know.

## 2020-08-22 ENCOUNTER — Other Ambulatory Visit: Payer: Self-pay | Admitting: Student in an Organized Health Care Education/Training Program

## 2020-08-22 DIAGNOSIS — G894 Chronic pain syndrome: Secondary | ICD-10-CM

## 2020-09-14 ENCOUNTER — Ambulatory Visit
Payer: Medicare HMO | Attending: Student in an Organized Health Care Education/Training Program | Admitting: Student in an Organized Health Care Education/Training Program

## 2020-09-21 ENCOUNTER — Encounter: Payer: Self-pay | Admitting: Student in an Organized Health Care Education/Training Program

## 2020-09-21 ENCOUNTER — Other Ambulatory Visit: Payer: Self-pay

## 2020-09-21 ENCOUNTER — Ambulatory Visit
Payer: Medicare HMO | Attending: Student in an Organized Health Care Education/Training Program | Admitting: Student in an Organized Health Care Education/Training Program

## 2020-09-21 VITALS — BP 145/97 | HR 87 | Temp 96.6°F | Resp 18 | Ht 68.0 in | Wt 210.0 lb

## 2020-09-21 DIAGNOSIS — M48062 Spinal stenosis, lumbar region with neurogenic claudication: Secondary | ICD-10-CM

## 2020-09-21 DIAGNOSIS — M47816 Spondylosis without myelopathy or radiculopathy, lumbar region: Secondary | ICD-10-CM | POA: Diagnosis not present

## 2020-09-21 DIAGNOSIS — M79604 Pain in right leg: Secondary | ICD-10-CM | POA: Diagnosis not present

## 2020-09-21 DIAGNOSIS — G894 Chronic pain syndrome: Secondary | ICD-10-CM | POA: Diagnosis not present

## 2020-09-21 DIAGNOSIS — M79605 Pain in left leg: Secondary | ICD-10-CM | POA: Insufficient documentation

## 2020-09-21 MED ORDER — OXYCODONE-ACETAMINOPHEN 10-325 MG PO TABS: 1.0000 | ORAL_TABLET | Freq: Three times a day (TID) | ORAL | 0 refills | Status: AC | PRN

## 2020-09-21 MED ORDER — DULOXETINE HCL 60 MG PO CPEP: 60.0000 mg | ORAL_CAPSULE | Freq: Every day | ORAL | 2 refills | Status: DC

## 2020-09-21 MED ORDER — GABAPENTIN 300 MG PO CAPS: 600.0000 mg | ORAL_CAPSULE | Freq: Every day | ORAL | 4 refills | Status: DC

## 2020-09-21 MED ORDER — OXYCODONE-ACETAMINOPHEN 10-325 MG PO TABS: 1.0000 | ORAL_TABLET | Freq: Three times a day (TID) | ORAL | 0 refills | Status: DC | PRN

## 2020-09-21 NOTE — Progress Notes (Signed)
PROVIDER NOTE: Information contained herein reflects review and annotations entered in association with encounter. Interpretation of such information and data should be left to medically-trained personnel. Information provided to patient can be located elsewhere in the medical record under "Patient Instructions". Document created using STT-dictation technology, any transcriptional errors that may result from process are unintentional.    Patient: Gary [name]  Service Category: E/M  Provider: Gillis Santa, MD  DOB: 09-20-1950  DOS: 09/21/2020  Specialty: Interventional Pain Management  MRN: [id number]  Setting: Ambulatory outpatient  PCP: Kirk Ruths, MD  Type: Established Patient    Referring Provider: Kirk Ruths, MD  Location: Office  Delivery: Face-to-face     HPI  [name], a 69 y.o. year old male, is here today because of his Chronic pain syndrome [G89.4]. Gary [name] primary complain today is Back Pain (bilateral) Last encounter: My last encounter with him was on 06/08/20 Pertinent problems: Gary [name] has Lumbar spondylosis; Lumbar degenerative disc disease; Degenerative disc disease, cervical; Chronic pain syndrome; Facet arthropathy, lumbar; Spinal stenosis of lumbar region with neurogenic claudication; Chronic bilateral low back pain with left-sided sciatica; Chronic left sacroiliac joint pain; Chronic hip pain, left; Cervical spondylosis with radiculopathy; Groin pain, chronic, right; and Chronic left shoulder pain on their pertinent problem list. Pain Assessment: Severity of Chronic pain is reported as a 7 /10. Location: Back Right, Left/radiates into both buttocks. Onset: More than a month ago. Quality: Stabbing. Timing: Constant. Modifying factor(s): denies. Vitals:  height is '5\' 8"'  (1.727 m) and weight is 210 lb (95.3 kg). His temperature is 96.6 F (35.9 C) (abnormal). His blood pressure is 145/97 (abnormal) and his pulse is 87. His respiration is 18  and oxygen saturation is 96%.   Reason for encounter: medication management.    Patient presents today for medication management.  No significant change in his medical history.  He is having increased groin pain and leg pain that is worse with exertion.  He states that he has a family history of peripheral arterial disease requiring peripheral stents.  He is requesting a referral to vein and vascular.  Otherwise he continues his oxycodone as prescribed, no change in dose.  We will refill as below along with his multimodal analgesics including gabapentin and Cymbalta.  We will also complete urine toxicology screen for annual medication compliance and monitoring.  Pharmacotherapy Assessment   Analgesic: Oxycodone 10 mg TID prn, #90/month, MME=45   Monitoring: [place] PMP: PDMP reviewed during this encounter.       Pharmacotherapy: No side-effects or adverse reactions reported. Compliance: No problems identified. Effectiveness: Clinically acceptable.  [name], RN  09/21/2020  8:09 AM  Signed Nursing Pain Medication Assessment:  Safety precautions to be maintained throughout the outpatient stay will include: orient to surroundings, keep bed in low position, maintain call bell within reach at all times, provide assistance with transfer out of bed and ambulation.  Medication Inspection Compliance: Pill count conducted under aseptic conditions, in front of the patient. Neither the pills nor the bottle was removed from the patient's sight at any time. Once count was completed pills were immediately returned to the patient in their original bottle.  Medication: Oxycodone/APAP Pill/Patch Count:  63 of 90 pills remain Pill/Patch Appearance: Markings consistent with prescribed medication Bottle Appearance: Standard pharmacy container. Clearly labeled. Filled Date: 06 / 06 / 2022 Last Medication intake:  Today      UDS:  Summary  Date Value Ref Range Status  09/04/2019  Note  Final    Comment:     ==================================================================== [employer] Select 13 (MW) ==================================================================== Test                             Result       Flag       Units Drug Present and Declared for Prescription Verification   Oxycodone                      711          EXPECTED   ng/mg creat   Oxymorphone                    417          EXPECTED   ng/mg creat   Noroxycodone                   1073         EXPECTED   ng/mg creat   Noroxymorphone                 183          EXPECTED   ng/mg creat    Sources of oxycodone are scheduled prescription medications.    Oxymorphone, noroxycodone, and noroxymorphone are expected    metabolites of oxycodone. Oxymorphone is also available as a    scheduled prescription medication. ==================================================================== Test                      Result    Flag   Units      Ref Range   Creatinine              132              mg/dL      >=20 ==================================================================== Declared Medications:  The flagging and interpretation on this report are based on the  following declared medications.  Unexpected results may arise from  inaccuracies in the declared medications.  **Note: The testing scope of this panel includes these medications:  Oxycodone (Percocet)  **Note: The testing scope of this panel does not include the  following reported medications:  Acetaminophen (Tylenol)  Acetaminophen (Percocet)  Albuterol  Aspirin  Atorvastatin (Lipitor)  Bupropion (Wellbutrin)  Buspirone (Buspar)  Duloxetine (Cymbalta)  Esomeprazole (Nexium)  Evolocumab (Repatha)  Ezetimibe (Zetia)  Fenofibrate (TriCor)  Finasteride (Proscar)  Gabapentin (Neurontin)  Hydrochlorothiazide (Hydrodiuril)  Ibuprofen (Advil)  Lovastatin  Meloxicam (Mobic)  Methocarbamol (Robaxin)  Niacin  Pravastatin (Pravachol)  Sildenafil  Tamsulosin  (Flomax)  Topical  Topical Diclofenac  Vitamin D ==================================================================== For clinical consultation, please call [phone]. ====================================================================      ROS  Constitutional:  Bilateral leg, groin pain worse with exertion Gastrointestinal: No reported hemesis, hematochezia, vomiting, or acute GI distress Musculoskeletal: Denies any acute onset joint swelling, redness, loss of ROM, or weakness Neurological: No reported episodes of acute onset apraxia, aphasia, dysarthria, agnosia, amnesia, paralysis, loss of coordination, or loss of consciousness  Medication Review  Albuterol Sulfate, Bempedoic Acid, DULoxetine, acetaminophen, buPROPion, busPIRone, cholecalciferol, clobetasol ointment, clotrimazole-betamethasone, diclofenac sodium, esomeprazole, ezetimibe, fenofibrate, finasteride, gabapentin, hydrochlorothiazide, multivitamin, mupirocin ointment, oxyCODONE-acetaminophen, potassium chloride, sildenafil, and tamsulosin  History Review  Allergy: Gary [name] is allergic to niacin-lovastatin er, atorvastatin, simvastatin, and propoxyphene. Drug: Gary [name]  reports no history of drug use. Alcohol:  reports no history of alcohol use. Tobacco:  reports that he quit  smoking about 8 years ago. His smoking use included cigarettes. He has a 67.50 pack-year smoking history. He has never used smokeless tobacco. Social: Gary [name]  reports that he quit smoking about 8 years ago. His smoking use included cigarettes. He has a 67.50 pack-year smoking history. He has never used smokeless tobacco. He reports that he does not drink alcohol and does not use drugs. Medical:  has a past medical history of Anxiety, Asthma, Benign essential HTN (06/24/2015), BPH (benign prostatic hyperplasia), COPD (chronic obstructive pulmonary disease) ([place]), Coronary artery disease (02/16/2014), DDD (degenerative disc disease), cervical,  Degenerative disc disease, cervical (01/22/2017), Depression, DJD (degenerative joint disease), Emphysema of lung ([place]), Heel pain (10/10/2017), Hyperglobulinemia, Lumbar degenerative disc disease (01/22/2017), Major depression in remission ([place]) (03/10/2014), Migraines, Mitral regurgitation, Mixed hyperlipidemia (02/16/2014), Neck pain (01/24/2017), OSA (obstructive sleep apnea) (07/05/2016), Osteoarthritis of knee (08/12/2014), Prostate cancer ([place]) (2019), RA (rheumatoid arthritis) ([place]), Spinal stenosis of lumbar region with neurogenic claudication (04/18/2016), Spondylosis without myelopathy or radiculopathy, lumbar region (01/22/2017), and Venous insufficiency of both lower extremities (11/07/2016). Surgical: Gary [name]  has a past surgical history that includes Cholecystectomy; Knee surgery (Left); deviated septum repair; Cardiac catheterization; Colonoscopy; Nasal septum surgery; Colonoscopy with propofol (N/A, 01/09/2018); and Esophagogastroduodenoscopy (egd) with propofol (N/A, 01/09/2018). Family: family history includes Heart disease in his mother.  Laboratory Chemistry Profile   Renal Lab Results  Component Value Date   CREATININE 0.90 06/02/2020     Hepatic No results found for: AST, ALT, ALBUMIN, ALKPHOS, HCVAB, AMYLASE, LIPASE, AMMONIA   Electrolytes No results found for: NA, K, CL, CALCIUM, MG, PHOS   Bone Lab Results  Component Value Date   TESTOSTERONE <3 (L) 04/29/2018     Inflammation (CRP: Acute Phase) (ESR: Chronic Phase) No results found for: CRP, ESRSEDRATE, LATICACIDVEN     Note: Above Lab results reviewed.  Recent Imaging Review  CT CHEST LUNG CANCER SCREENING LOW DOSE WO CONTRAST CLINICAL DATA:  70 year old male former smoker (quit 6 years ago) with 68 pack-year history of smoking. Lung cancer screening examination.  EXAM: CT CHEST WITHOUT CONTRAST LOW-DOSE FOR LUNG CANCER SCREENING  TECHNIQUE: Multidetector CT imaging of the chest was performed following  the standard protocol without IV contrast.  COMPARISON:  Low-dose lung cancer screening chest CT 06/02/2020.  FINDINGS: Cardiovascular: Heart size is normal. There is no significant pericardial fluid, thickening or pericardial calcification. There is aortic atherosclerosis, as well as atherosclerosis of the great vessels of the mediastinum and the coronary arteries, including calcified atherosclerotic plaque in the left circumflex coronary artery.  Mediastinum/Nodes: No pathologically enlarged mediastinal or hilar lymph nodes. Please note that accurate exclusion of hilar adenopathy is limited on noncontrast CT scans. Esophagus is unremarkable in appearance. No axillary lymphadenopathy.  Lungs/Pleura: Tiny pulmonary nodules are again noted in the right lung, largest of which is in the right lower lobe (axial image 189 of series 3), with a volume derived mean diameter of 2.5 mm. No other larger more suspicious appearing pulmonary nodules or masses are noted. No acute consolidative airspace disease. No pleural effusions. Mild diffuse bronchial wall thickening with very mild centrilobular and paraseptal emphysema.  Upper Abdomen: Aortic atherosclerosis. Status post cholecystectomy. Small amount of pneumobilia, similar to the prior study, presumably from prior sphincterotomy.  Musculoskeletal: Multiple old healed bilateral rib fractures. There are no aggressive appearing lytic or blastic lesions noted in the visualized portions of the skeleton.  IMPRESSION: 1. Lung-RADS 2S, benign appearance or behavior. Continue annual screening with low-dose chest CT without contrast  in 12 months. 2. The "S" modifier above refers to potentially clinically significant non lung cancer related findings. Specifically, there is aortic atherosclerosis, in addition to left circumflex coronary artery disease. Please note that although the presence of coronary artery calcium documents the presence of  coronary artery disease, the severity of this disease and any potential stenosis cannot be assessed on this non-gated CT examination. Assessment for potential risk factor modification, dietary therapy or pharmacologic therapy may be warranted, if clinically indicated. 3. Mild diffuse bronchial wall thickening with very mild centrilobular and paraseptal emphysema; imaging findings suggestive of underlying COPD.  Aortic Atherosclerosis (ICD10-I70.0) and Emphysema (ICD10-J43.9).  Electronically Signed   By: Vinnie Langton M.D.   On: 08/03/2020 12:04 Note: Reviewed        Physical Exam  General appearance: Well nourished, well developed, and well hydrated. In no apparent acute distress Mental status: Alert, oriented x 3 (person, place, & time)       Respiratory: No evidence of acute respiratory distress Eyes: PERLA Vitals: BP (!) 145/97 (BP Location: Right Arm, Patient Position: Sitting, Cuff Size: Normal)   Pulse 87   Temp (!) 96.6 F (35.9 C)   Resp 18   Ht '5\' 8"'  (1.727 m)   Wt 210 lb (95.3 kg)   SpO2 96%   BMI 31.93 kg/m  BMI: Estimated body mass index is 31.93 kg/m as calculated from the following:   Height as of this encounter: '5\' 8"'  (1.727 m).   Weight as of this encounter: 210 lb (95.3 kg). Ideal: Ideal body weight: 68.4 kg (150 lb 12.7 oz) Adjusted ideal body weight: 79.1 kg (174 lb 7.6 oz)  Lumbar Spine Area Exam  Skin & Axial Inspection: No masses, redness, or swelling Alignment: Symmetrical Functional ROM: Pain restricted ROM affecting both sides Stability: No instability detected Muscle Tone/Strength: Functionally intact. No obvious neuro-muscular anomalies detected. Sensory (Neurological): Neurogenic pain pattern   Lower Extremity Exam      Side: Right lower extremity   Side: Left lower extremity  Stability: No instability observed           Stability: No instability observed          Skin & Extremity Inspection: Skin color, temperature, and hair growth  are WNL. No peripheral edema or cyanosis. No masses, redness, swelling, asymmetry, or associated skin lesions. No contractures.   Skin & Extremity Inspection: Skin color, temperature, and hair growth are WNL. No peripheral edema or cyanosis. No masses, redness, swelling, asymmetry, or associated skin lesions. No contractures.  Functional ROM: Pain restricted ROM for hip and knee joints           Functional ROM: Pain restricted ROM for hip and knee joints          Muscle Tone/Strength: Functionally intact. No obvious neuro-muscular anomalies detected.   Muscle Tone/Strength: Functionally intact. No obvious neuro-muscular anomalies detected.  Sensory (Neurological): Neurogenic pain pattern         Sensory (Neurological): Neurogenic pain pattern        DTR: Patellar: deferred today Achilles: deferred today Plantar: deferred today   DTR: Patellar: deferred today Achilles: deferred today Plantar: deferred today  Palpation: No palpable anomalies   Palpation: No palpable anomalies    5 out of 5 strength bilateral lower extremity: Plantar flexion, dorsiflexion, knee flexion, knee extension.  Assessment   Status Diagnosis  Persistent Persistent Persistent 1. Chronic pain syndrome   2. Lumbar spondylosis   3. Bilateral leg pain   4. Facet  arthropathy, lumbar   5. Spinal stenosis of lumbar region with neurogenic claudication       Plan of Care   Gary [name] has a current medication list which includes the following long-term medication(s): albuterol sulfate, bupropion, esomeprazole, ezetimibe, fenofibrate, hydrochlorothiazide, potassium chloride, duloxetine, and gabapentin.  Pharmacotherapy (Medications Ordered): Meds ordered this encounter  Medications   oxyCODONE-acetaminophen (PERCOCET) 10-325 MG tablet    Sig: Take 1 tablet by mouth every 8 (eight) hours as needed for pain. Must last 30 days.    Dispense:  90 tablet    Refill:  0    [place] STOP ACT - Not applicable. Fill one  day early if pharmacy is closed on scheduled refill date.   oxyCODONE-acetaminophen (PERCOCET) 10-325 MG tablet    Sig: Take 1 tablet by mouth every 8 (eight) hours as needed for pain. Must last 30 days.    Dispense:  90 tablet    Refill:  0    [place] STOP ACT - Not applicable. Fill one day early if pharmacy is closed on scheduled refill date.   oxyCODONE-acetaminophen (PERCOCET) 10-325 MG tablet    Sig: Take 1 tablet by mouth every 8 (eight) hours as needed for pain. Must last 30 days.    Dispense:  90 tablet    Refill:  0     STOP ACT - Not applicable. Fill one day early if pharmacy is closed on scheduled refill date.   gabapentin (NEURONTIN) 300 MG capsule    Sig: Take 2 capsules (600 mg total) by mouth at bedtime.    Dispense:  60 capsule    Refill:  4   DULoxetine (CYMBALTA) 60 MG capsule    Sig: Take 1 capsule (60 mg total) by mouth daily.    Dispense:  30 capsule    Refill:  2   Orders:  Orders Placed This Encounter  Procedures   [employer] Select 13 (MW), Urine    Volume: 30 ml(s). Minimum 3 ml of urine is needed. Document temperature of fresh sample. Indications: Long term (current) use of opiate analgesic (Z79.891)    Order Specific Question:   Release to patient    Answer:   Immediate   Ambulatory referral to Vascular Surgery    Referral Priority:   Routine    Referral Type:   Surgical    Referral Reason:   Specialty Services Required    Requested Specialty:   Vascular Surgery    Number of Visits Requested:   1   Follow-up plan:   Return in about 4 months (around 01/06/2021) for Medication Management, in person.     s/p b/l SI-J 12/02/2018- not helpful,  lumbar facets L3-S1 b/l; consider sprint peripheral nerve stimulation of lumbar medial branch lumbar facet arthropathy.        Recent Visits No visits were found meeting these conditions. Showing recent visits within past 90 days and meeting all other requirements Today's Visits Date Type Provider Dept  09/21/20  Office Visit Gillis Santa, MD [facility]  Showing today's visits and meeting all other requirements Future Appointments No visits were found meeting these conditions. Showing future appointments within next 90 days and meeting all other requirements I discussed the assessment and treatment plan with the patient. The patient was provided an opportunity to ask questions and all were answered. The patient agreed with the plan and demonstrated an understanding of the instructions.  Patient advised to call back or seek an in-person evaluation if the symptoms or  condition worsens.  Duration of encounter:30 minutes.  Note by: Gillis Santa, MD Date: 09/21/2020; Time: 9:03 AM

## 2020-09-21 NOTE — Progress Notes (Signed)
Nursing Pain Medication Assessment:  Safety precautions to be maintained throughout the outpatient stay will include: orient to surroundings, keep bed in low position, maintain call bell within reach at all times, provide assistance with transfer out of bed and ambulation.  Medication Inspection Compliance: Pill count conducted under aseptic conditions, in front of the patient. Neither the pills nor the bottle was removed from the patient's sight at any time. Once count was completed pills were immediately returned to the patient in their original bottle.  Medication: Oxycodone/APAP Pill/Patch Count:  63 of 90 pills remain Pill/Patch Appearance: Markings consistent with prescribed medication Bottle Appearance: Standard pharmacy container. Clearly labeled. Filled Date: 06 / 06 / 2022 Last Medication intake:  Today

## 2020-09-29 LAB — TOXASSURE SELECT 13 (MW), URINE

## 2020-10-18 ENCOUNTER — Other Ambulatory Visit (INDEPENDENT_AMBULATORY_CARE_PROVIDER_SITE_OTHER): Payer: Self-pay | Admitting: Vascular Surgery

## 2020-10-18 DIAGNOSIS — M79604 Pain in right leg: Secondary | ICD-10-CM

## 2020-10-18 DIAGNOSIS — Z8249 Family history of ischemic heart disease and other diseases of the circulatory system: Secondary | ICD-10-CM

## 2020-10-18 DIAGNOSIS — M79605 Pain in left leg: Secondary | ICD-10-CM

## 2020-10-19 ENCOUNTER — Encounter (INDEPENDENT_AMBULATORY_CARE_PROVIDER_SITE_OTHER): Payer: Self-pay | Admitting: Nurse Practitioner

## 2020-10-19 ENCOUNTER — Encounter (INDEPENDENT_AMBULATORY_CARE_PROVIDER_SITE_OTHER): Payer: Self-pay

## 2020-10-25 ENCOUNTER — Other Ambulatory Visit: Payer: Self-pay

## 2020-10-25 ENCOUNTER — Ambulatory Visit (INDEPENDENT_AMBULATORY_CARE_PROVIDER_SITE_OTHER): Payer: Medicare HMO

## 2020-10-25 ENCOUNTER — Ambulatory Visit (INDEPENDENT_AMBULATORY_CARE_PROVIDER_SITE_OTHER): Payer: Medicare HMO | Admitting: Vascular Surgery

## 2020-10-25 VITALS — BP 138/83 | HR 81 | Ht 68.0 in | Wt 217.0 lb

## 2020-10-25 DIAGNOSIS — M609 Myositis, unspecified: Secondary | ICD-10-CM | POA: Insufficient documentation

## 2020-10-25 DIAGNOSIS — M79605 Pain in left leg: Secondary | ICD-10-CM

## 2020-10-25 DIAGNOSIS — I340 Nonrheumatic mitral (valve) insufficiency: Secondary | ICD-10-CM | POA: Insufficient documentation

## 2020-10-25 DIAGNOSIS — H9190 Unspecified hearing loss, unspecified ear: Secondary | ICD-10-CM | POA: Insufficient documentation

## 2020-10-25 DIAGNOSIS — I10 Essential (primary) hypertension: Secondary | ICD-10-CM | POA: Diagnosis not present

## 2020-10-25 DIAGNOSIS — J439 Emphysema, unspecified: Secondary | ICD-10-CM | POA: Insufficient documentation

## 2020-10-25 DIAGNOSIS — M79604 Pain in right leg: Secondary | ICD-10-CM | POA: Diagnosis not present

## 2020-10-25 DIAGNOSIS — Z8249 Family history of ischemic heart disease and other diseases of the circulatory system: Secondary | ICD-10-CM

## 2020-10-25 DIAGNOSIS — M5136 Other intervertebral disc degeneration, lumbar region: Secondary | ICD-10-CM

## 2020-10-25 DIAGNOSIS — R252 Cramp and spasm: Secondary | ICD-10-CM

## 2020-10-25 DIAGNOSIS — I25118 Atherosclerotic heart disease of native coronary artery with other forms of angina pectoris: Secondary | ICD-10-CM

## 2020-10-27 ENCOUNTER — Encounter (INDEPENDENT_AMBULATORY_CARE_PROVIDER_SITE_OTHER): Payer: Self-pay | Admitting: Vascular Surgery

## 2020-10-27 DIAGNOSIS — M79606 Pain in leg, unspecified: Secondary | ICD-10-CM | POA: Insufficient documentation

## 2020-10-27 DIAGNOSIS — R252 Cramp and spasm: Secondary | ICD-10-CM | POA: Insufficient documentation

## 2020-10-27 NOTE — Progress Notes (Signed)
MRN : [id number]  Gary [name] is a 70 y.o. (12/17/1950) male who presents with chief complaint of  Chief Complaint  Patient presents with   New Patient (Initial Visit)    BP bilat leg pain family Hx of PAD referred by [facility]  .  History of Present Illness:   The patient is seen for evaluation of painful lower extremities. Patient notes the pain is variable and not always associated with activity.  The pain is somewhat consistent day to day occurring on most days. The patient notes the pain also occurs with standing and routinely seems worse as the day wears on. The pain has been progressive over the past several years.   The patient is also describing a pain that occurs primarily at night while the patient is in bed.   The patient describes it as a cramping or Charley horse type pain. The patient notes the pain isn't associated with activity and is not very consistent day to day. The pain seems to be variable with time. Typically the pain occurs with varying positions and seems to progress until the leg is stretched. The pain has been progressive over the past several years which has prompted the concern for evaluation. The patient states this inability to walk has a significant negative impact on her quality of life and daily activities.  The patient an extensive history of degenerative spine disease both cervical and lumbar.  The patient denies rest pain. The patient denies dangling off the affected extremity during the night for pain relief. There are no open wounds or sores at this time.  No prior vascular interventions or vascular surgeries.  The patient denies amaurosis fugax or recent TIA symptoms. There are no recent neurological changes noted. The patient denies history of DVT, PE or superficial thrombophlebitis. The patient denies recent episodes of angina or shortness of breath.     ABI's obtained today are normal bilaterally.   Current Meds  Medication Sig    acetaminophen (TYLENOL) 500 MG tablet Take as needed by mouth.    Albuterol Sulfate 108 (90 Base) MCG/ACT AEPB Inhale as needed into the lungs.    Bempedoic Acid (NEXLETOL) 180 MG TABS Take 1 tablet by mouth daily.   buPROPion (WELLBUTRIN XL) 300 MG 24 hr tablet Take by mouth.   busPIRone (BUSPAR) 15 MG tablet Take 15 mg by mouth 2 (two) times a day.   cholecalciferol (VITAMIN D) 1000 units tablet Take by mouth.   clobetasol ointment (TEMOVATE) 9.15 % Apply 1 application topically 2 (two) times daily.   clotrimazole-betamethasone (LOTRISONE) cream Apply 1 application topically 2 (two) times daily.   diclofenac sodium (VOLTAREN) 1 % GEL Apply topically 4 (four) times daily.   DULoxetine (CYMBALTA) 60 MG capsule Take 1 capsule (60 mg total) by mouth daily.   esomeprazole (NEXIUM) 40 MG capsule Take 40 mg by mouth daily.   ezetimibe (ZETIA) 10 MG tablet Take 10 mg by mouth daily.    fenofibrate (TRICOR) 48 MG tablet Take 48 mg daily by mouth.    finasteride (PROSCAR) 5 MG tablet Take 5 mg daily by mouth.    gabapentin (NEURONTIN) 300 MG capsule Take 2 capsules (600 mg total) by mouth at bedtime.   hydrochlorothiazide (HYDRODIURIL) 25 MG tablet    Multiple Vitamin (MULTIVITAMIN) tablet Take 1 tablet by mouth daily.   mupirocin ointment (BACTROBAN) 2 % Place 1 application into the nose 2 (two) times daily.   oxyCODONE-acetaminophen (PERCOCET) 10-325 MG tablet Take 1  tablet by mouth every 8 (eight) hours as needed for pain. Must last 30 days.   [START ON 11/12/2020] oxyCODONE-acetaminophen (PERCOCET) 10-325 MG tablet Take 1 tablet by mouth every 8 (eight) hours as needed for pain. Must last 30 days.   [START ON 12/12/2020] oxyCODONE-acetaminophen (PERCOCET) 10-325 MG tablet Take 1 tablet by mouth every 8 (eight) hours as needed for pain. Must last 30 days.   potassium chloride (KLOR-CON) 10 MEQ tablet Take 1 tablet by mouth daily.   sildenafil (REVATIO) 20 MG tablet Take 3 to 5 tablets two hours before  intercouse on an empty stomach.  Do not take with nitrates.   tamsulosin (FLOMAX) 0.4 MG CAPS capsule TAKE 1 CAPSULE EVERY DAY AFTER SUPPER   Current Facility-Administered Medications for the 10/25/20 encounter (Office Visit) with Delana Meyer, Dolores Lory, MD  Medication   leuprolide (6 Month) (ELIGARD) injection 45 mg    Past Medical History:  Diagnosis Date   Anxiety    Asthma    Benign essential HTN 06/24/2015   Last Assessment & Plan:  Taking medications without noted side effects or dizziness.   Overview:  Last Assessment & Plan:  Is compliant with hypertensive medications without clear side effects or lack of control.     BPH (benign prostatic hyperplasia)    COPD (chronic obstructive pulmonary disease) (HCC)    Coronary artery disease 02/16/2014   Overview:  Minimal disease by cath 12/13  Last Assessment & Plan:  Seems to be tolerating medical regimen without significant side effects and symptoms such as worsening chest pain are not noted.   Overview:  Overview:  Minimal disease by cath 12/13  Last Assessment & Plan:  Seems to be tolerating medical regimen without significant side effects and symptoms such as worsening chest pain are not no   DDD (degenerative disc disease), cervical    Degenerative disc disease, cervical 01/22/2017   Depression    DJD (degenerative joint disease)    Emphysema of lung ([place])    Heel pain 10/10/2017   Hyperglobulinemia    Lumbar degenerative disc disease 01/22/2017   Major depression in remission ([place]) 03/10/2014   Last Assessment & Plan:  Mood is doing well on meds Overview:  Last Assessment & Plan:  Mood is doing well on meds    Migraines    Mitral regurgitation    Mixed hyperlipidemia 02/16/2014   Last Assessment & Plan:  Diet for healthy cholesterol is being attempted and no clear myalgia's or other side effects are noted.   Overview:  Last Assessment & Plan:  Low fat diet is being attempted and no significant side effects such as myalgia's are noted.      Neck pain 01/24/2017   OSA (obstructive sleep apnea) 07/05/2016   Last Assessment & Plan:  Continues to use cpap consistently and is continuing to benefit from it's use.     Osteoarthritis of knee 08/12/2014   Overview:  Right knee is worse now  Last Assessment & Plan:  Diffuse pain on back in hips and knees also. Went to a pain clinic  Overview:  Overview:  Right knee is worse now  Last Assessment & Plan:  Diffuse pain on back in hips and knees also. Went to a pain clinic    Prostate cancer ([place]) 2019   Rad Tx's.    RA (rheumatoid arthritis) ([place])    Spinal stenosis of lumbar region with neurogenic claudication 04/18/2016   Spondylosis without myelopathy or radiculopathy, lumbar region 01/22/2017   Venous  insufficiency of both lower extremities 11/07/2016    Past Surgical History:  Procedure Laterality Date   CARDIAC CATHETERIZATION     CHOLECYSTECTOMY     COLONOSCOPY     COLONOSCOPY WITH PROPOFOL N/A 01/09/2018   Procedure: COLONOSCOPY WITH PROPOFOL;  Surgeon: Toledo, Benay Pike, MD;  Location: [facility] ENDOSCOPY;  Service: Gastroenterology;  Laterality: N/A;   deviated septum repair     ESOPHAGOGASTRODUODENOSCOPY (EGD) WITH PROPOFOL N/A 01/09/2018   Procedure: ESOPHAGOGASTRODUODENOSCOPY (EGD) WITH PROPOFOL;  Surgeon: Toledo, Benay Pike, MD;  Location: [facility] ENDOSCOPY;  Service: Gastroenterology;  Laterality: N/A;   KNEE SURGERY Left    NASAL SEPTUM SURGERY      Social History Social History   Tobacco Use   Smoking status: Former    Packs/day: 1.50    Years: 45.00    Pack years: 67.50    Types: Cigarettes    Quit date: 2014    Years since quitting: 8.5   Smokeless tobacco: Never  Vaping Use   Vaping Use: Never used  Substance Use Topics   Alcohol use: No   Drug use: No    Family History Family History  Problem Relation Age of Onset   Heart disease Mother   No family history of bleeding/clotting disorders, porphyria or autoimmune disease   Allergies  Allergen Reactions    Niacin-Lovastatin Er Other (See Comments)    unknown unknown   Atorvastatin Other (See Comments)   Other    Simvastatin Other (See Comments)   Propoxyphene Nausea Only    Passed out after taking it on an empty stomach     REVIEW OF SYSTEMS (Negative unless checked)  Constitutional: [] Weight loss  [] Fever  [] Chills Cardiac: [] Chest pain   [] Chest pressure   [] Palpitations   [] Shortness of breath when laying flat   [] Shortness of breath with exertion. Vascular:  [] Pain in legs with walking   [x] Pain in legs at rest  [] History of DVT   [] Phlebitis   [] Swelling in legs   [] Varicose veins   [] Non-healing ulcers Pulmonary:   [] Uses home oxygen   [] Productive cough   [] Hemoptysis   [] Wheeze  [] COPD   [] Asthma Neurologic:  [] Dizziness   [] Seizures   [] History of stroke   [] History of TIA  [] Aphasia   [] Vissual changes   [] Weakness or numbness in arm   [] Weakness or numbness in leg Musculoskeletal:   [] Joint swelling   [] Joint pain   [x] Low back pain Hematologic:  [] Easy bruising  [] Easy bleeding   [] Hypercoagulable state   [] Anemic Gastrointestinal:  [] Diarrhea   [] Vomiting  [] Gastroesophageal reflux/heartburn   [] Difficulty swallowing. Genitourinary:  [] Chronic kidney disease   [] Difficult urination  [] Frequent urination   [] Blood in urine Skin:  [] Rashes   [] Ulcers  Psychological:  [] History of anxiety   []  History of major depression.  Physical Examination  Vitals:   10/25/20 1515  BP: 138/83  Pulse: 81  Weight: 217 lb (98.4 kg)  Height: 5\' 8"  (1.727 m)   Body mass index is 32.99 kg/m. Gen: WD/WN, NAD Head: [place]/AT, No temporalis wasting.  Ear/Nose/Throat: Hearing grossly intact, nares w/o erythema or drainage, poor dentition Eyes: PER, EOMI, sclera nonicteric.  Neck: Supple, no masses.  No bruit or JVD.  Pulmonary:  Good air movement, clear to auscultation bilaterally, no use of accessory muscles.  Cardiac: RRR, normal S1, S2, no Murmurs. Vascular:  Vessel Right Left  Radial  Palpable Palpable  PT Palpable Palpable  DP Palpable Palpable  Gastrointestinal: soft, non-distended. No guarding/no  peritoneal signs.  Musculoskeletal: M/S 5/5 throughout.  No deformity or atrophy.  Neurologic: CN 2-12 intact. Pain and light touch intact in extremities.  Symmetrical.  Speech is fluent. Motor exam as listed above. Psychiatric: Judgment intact, Mood & affect appropriate for pt's clinical situation. Dermatologic: No rashes or ulcers noted.  No changes consistent with cellulitis.  CBC Lab Results  Component Value Date   WBC 6.2 05/08/2018   HGB 14.3 05/08/2018   HCT 42.4 05/08/2018   MCV 94.6 05/08/2018   PLT 229 05/08/2018    BMET    Component Value Date/Time   CREATININE 0.90 06/02/2020 1357   CrCl cannot be calculated (Patient's most recent lab result is older than the maximum 21 days allowed.).  COAG No results found for: INR, PROTIME  Radiology No results found.    Assessment/Plan 1. Leg cramps Recommend:  The patient is describing Charley horse type leg cramps. No invasive studies, angiography or surgery at this time.    I have reviewed homeopathic remedies such as Cider vinegar or mustard; placing a bar of soap at the bottom of the bed. Quinine is also an option Magnesium supplementation at bedtime was also reviewed.  The patient should continue walking and begin a more formal exercise program.  The patient should continue antiplatelet therapy and aggressive treatment of the lipid abnormalities  The patient should continue wearing graduated compression socks 10-15 mmHg strength to control any mild edema.  The patient will follow up with me on a PRN basis.    2. Pain in both lower extremities Recommend:  I do not find evidence of Vascular pathology that would explain the patient's symptoms  The patient has atypical pain symptoms for vascular disease  I do not find evidence of Vascular pathology that would explain the patient's symptoms  and I suspect the patient is c/o pseudoclaudication.  Patient should have an evaluation of his LS spine which I defer to the primary service.  Noninvasive studies including venous ultrasound of the legs do not identify vascular problems  The patient should continue walking and begin a more formal exercise program. The patient should continue his antiplatelet therapy and aggressive treatment of the lipid abnormalities. The patient should begin wearing graduated compression socks 15-20 mmHg strength to control her mild edema.  Patient will follow-up with me on a PRN basis  Further work-up of her lower extremity pain is deferred to the primary service      3. Lumbar degenerative disc disease Continue NSAID medications as already ordered, these medications have been reviewed and there are no changes at this time.  Continued activity and therapy was stressed.   4. Benign essential HTN Continue antihypertensive medications as already ordered, these medications have been reviewed and there are no changes at this time.   5. Coronary artery disease of native artery of native heart with stable angina pectoris ([place]) Continue cardiac and antihypertensive medications as already ordered and reviewed, no changes at this time.  Continue statin as ordered and reviewed, no changes at this time  Nitrates PRN for chest pain     Hortencia Pilar, MD  10/27/2020 2:07 PM

## 2020-12-22 ENCOUNTER — Other Ambulatory Visit: Payer: Self-pay | Admitting: Student in an Organized Health Care Education/Training Program

## 2020-12-28 ENCOUNTER — Encounter: Payer: Medicare HMO | Admitting: Student in an Organized Health Care Education/Training Program

## 2020-12-30 ENCOUNTER — Ambulatory Visit
Payer: Medicare HMO | Attending: Student in an Organized Health Care Education/Training Program | Admitting: Student in an Organized Health Care Education/Training Program

## 2020-12-30 ENCOUNTER — Encounter: Payer: Self-pay | Admitting: Student in an Organized Health Care Education/Training Program

## 2020-12-30 ENCOUNTER — Other Ambulatory Visit: Payer: Self-pay

## 2020-12-30 VITALS — BP 144/97 | HR 108 | Temp 96.9°F | Resp 18 | Ht 67.0 in | Wt 219.0 lb

## 2020-12-30 DIAGNOSIS — Z0289 Encounter for other administrative examinations: Secondary | ICD-10-CM | POA: Insufficient documentation

## 2020-12-30 DIAGNOSIS — M533 Sacrococcygeal disorders, not elsewhere classified: Secondary | ICD-10-CM

## 2020-12-30 DIAGNOSIS — G894 Chronic pain syndrome: Secondary | ICD-10-CM

## 2020-12-30 DIAGNOSIS — M5416 Radiculopathy, lumbar region: Secondary | ICD-10-CM | POA: Insufficient documentation

## 2020-12-30 DIAGNOSIS — M79604 Pain in right leg: Secondary | ICD-10-CM

## 2020-12-30 DIAGNOSIS — M48061 Spinal stenosis, lumbar region without neurogenic claudication: Secondary | ICD-10-CM

## 2020-12-30 DIAGNOSIS — M79605 Pain in left leg: Secondary | ICD-10-CM | POA: Diagnosis present

## 2020-12-30 DIAGNOSIS — M48062 Spinal stenosis, lumbar region with neurogenic claudication: Secondary | ICD-10-CM

## 2020-12-30 DIAGNOSIS — M47816 Spondylosis without myelopathy or radiculopathy, lumbar region: Secondary | ICD-10-CM

## 2020-12-30 DIAGNOSIS — G8929 Other chronic pain: Secondary | ICD-10-CM | POA: Diagnosis present

## 2020-12-30 MED ORDER — GABAPENTIN 300 MG PO CAPS: 600.0000 mg | ORAL_CAPSULE | Freq: Every day | ORAL | 4 refills | Status: DC

## 2020-12-30 MED ORDER — OXYCODONE-ACETAMINOPHEN 10-325 MG PO TABS: 1.0000 | ORAL_TABLET | Freq: Three times a day (TID) | ORAL | 0 refills | Status: AC | PRN

## 2020-12-30 MED ORDER — DULOXETINE HCL 60 MG PO CPEP: 60.0000 mg | ORAL_CAPSULE | Freq: Every day | ORAL | 2 refills | Status: DC

## 2020-12-30 NOTE — Progress Notes (Signed)
Nursing Pain Medication Assessment:  Safety precautions to be maintained throughout the outpatient stay will include: orient to surroundings, keep bed in low position, maintain call bell within reach at all times, provide assistance with transfer out of bed and ambulation.  Medication Inspection Compliance: Pill count conducted under aseptic conditions, in front of the patient. Neither the pills nor the bottle was removed from the patient's sight at any time. Once count was completed pills were immediately returned to the patient in their original bottle.  Medication: Oxycodone/APAP Pill/Patch Count:  15 of 90 pills remain Pill/Patch Appearance: Markings consistent with prescribed medication Bottle Appearance: Standard pharmacy container. Clearly labeled. Filled Date: 08 / 29 / 2022 Last Medication intake:  Today Safety precautions to be maintained throughout the outpatient stay will include: orient to surroundings, keep bed in low position, maintain call bell within reach at all times, provide assistance with transfer out of bed and ambulation.

## 2020-12-30 NOTE — Progress Notes (Signed)
PROVIDER NOTE: Information contained herein reflects review and annotations entered in association with encounter. Interpretation of such information and data should be left to medically-trained personnel. Information provided to patient can be located elsewhere in the medical record under "Patient Instructions". Document created using STT-dictation technology, any transcriptional errors that may result from process are unintentional.    Patient: Gary [name]  Service Category: E/M  Provider: Gillis Santa, MD  DOB: April 24, 1950  DOS: 12/30/2020  Specialty: Interventional Pain Management  MRN: [id number]  Setting: Ambulatory outpatient  PCP: Gary Ruths, MD  Type: Established Patient    Referring Provider: Kirk Ruths, MD  Location: Office  Delivery: Face-to-face     HPI  [name], a 70 y.o. year old male, is here today because of his Chronic pain syndrome [G89.4]. Gary [name] primary complain today is Back Pain (lower) Last encounter: My last encounter with him was on 09/21/20 Pertinent problems: Gary [name] has Lumbar spondylosis; Lumbar degenerative disc disease; Degenerative disc disease, cervical; Chronic pain syndrome; Facet arthropathy, lumbar; Spinal stenosis of lumbar region with neurogenic claudication; Chronic bilateral low back pain with left-sided sciatica; Chronic left sacroiliac joint pain; Chronic hip pain, left; Cervical spondylosis with radiculopathy; Groin pain, chronic, right; and Chronic left shoulder pain on their pertinent problem list. Pain Assessment: Severity of Chronic pain is reported as a 6 /10. Location: Back Lower/both legs and hips. Onset: More than a month ago. Quality: Constant, Stabbing. Timing: Constant. Modifying factor(s): medicaitons. Vitals:  height is '5\' 7"'  (1.702 m) and weight is 219 lb (99.3 kg). His temporal temperature is 96.9 F (36.1 C) (abnormal). His blood pressure is 144/97 (abnormal) and his pulse is 108 (abnormal). His  respiration is 18 and oxygen saturation is 94%.   Reason for encounter: medication management.    -increased bilateral lumbar radicular pain, radiating to posterolateral calf on left leg and right ankle -discussed Lumbar ESI for lumbar radicular pain -he is the primary caregiver of his wife who has many health issues and is morbidly obese -Refill of Gabapentin, Oxycodone, and Cymbalta as below.  States that it provides pain relief and improvement in functional status.    Pharmacotherapy Assessment  Analgesic: Oxycodone 10 mg TID prn, #90/month, MME=45   Monitoring: [place] PMP: PDMP reviewed during this encounter.       Pharmacotherapy: No side-effects or adverse reactions reported. Compliance: No problems identified. Effectiveness: Clinically acceptable.  UDS:  Summary  Date Value Ref Range Status  09/21/2020 Note  Final    Comment:    ==================================================================== [facility] (MW) ==================================================================== Test                             Result       Flag       Units  Drug Present and Declared for Prescription Verification   Oxycodone                      1267         EXPECTED   ng/mg creat   Oxymorphone                    493          EXPECTED   ng/mg creat   Noroxycodone                   1407  EXPECTED   ng/mg creat   Noroxymorphone                 213          EXPECTED   ng/mg creat    Sources of oxycodone are scheduled prescription medications.    Oxymorphone, noroxycodone, and noroxymorphone are expected    metabolites of oxycodone. Oxymorphone is also available as a    scheduled prescription medication.  ==================================================================== Test                      Result    Flag   Units      Ref Range   Creatinine              101              mg/dL      >=20 ==================================================================== Declared  Medications:  The flagging and interpretation on this report are based on the  following declared medications.  Unexpected results may arise from  inaccuracies in the declared medications.   **Note: The testing scope of this panel includes these medications:   Oxycodone (Percocet)   **Note: The testing scope of this panel does not include the  following reported medications:   Acetaminophen (Tylenol)  Acetaminophen (Percocet)  Albuterol (Proair HFA)  Bempedoic Acid (Nexletol)  Betamethasone (Lotrisone)  Bupropion (Wellbutrin)  Buspirone (Buspar)  Clobetasol  Clotrimazole (Lotrisone)  Diclofenac (Voltaren)  Duloxetine (Cymbalta)  Esomeprazole (Nexium)  Ezetimibe (Zetia)  Fenofibrate (TriCor)  Finasteride (Proscar)  Gabapentin (Neurontin)  Hydrochlorothiazide (Hydrodiuril)  Multivitamin  Mupirocin (Bactroban)  Potassium (Klor-Con)  Sildenafil  Tamsulosin (Flomax)  Vitamin D ==================================================================== For clinical consultation, please call [phone]. ====================================================================       ROS  Constitutional:  low back pain, Bilateral leg, groin pain worse with exertion Gastrointestinal: No reported hemesis, hematochezia, vomiting, or acute GI distress Musculoskeletal: Denies any acute onset joint swelling, redness, loss of ROM, or weakness Neurological: No reported episodes of acute onset apraxia, aphasia, dysarthria, agnosia, amnesia, paralysis, loss of coordination, or loss of consciousness  Medication Review  Albuterol Sulfate, Bempedoic Acid, DULoxetine, Magnesium, acetaminophen, buPROPion, busPIRone, cholecalciferol, clobetasol ointment, clotrimazole-betamethasone, diclofenac sodium, esomeprazole, ezetimibe, fenofibrate, finasteride, gabapentin, hydrochlorothiazide, multivitamin, oxyCODONE-acetaminophen, potassium chloride, sildenafil, and tamsulosin  History Review  Allergy: Mr.  [name] is allergic to niacin-lovastatin er, atorvastatin, other, simvastatin, and propoxyphene. Drug: Gary [name]  reports no history of drug use. Alcohol:  reports no history of alcohol use. Tobacco:  reports that he quit smoking about 8 years ago. His smoking use included cigarettes. He has a 67.50 pack-year smoking history. He has never used smokeless tobacco. Social: Gary [name]  reports that he quit smoking about 8 years ago. His smoking use included cigarettes. He has a 67.50 pack-year smoking history. He has never used smokeless tobacco. He reports that he does not drink alcohol and does not use drugs. Medical:  has a past medical history of Anxiety, Asthma, Benign essential HTN (06/24/2015), BPH (benign prostatic hyperplasia), COPD (chronic obstructive pulmonary disease) ([place]), Coronary artery disease (02/16/2014), DDD (degenerative disc disease), cervical, Degenerative disc disease, cervical (01/22/2017), Depression, DJD (degenerative joint disease), Emphysema of lung ([place]), Heel pain (10/10/2017), Hyperglobulinemia, Lumbar degenerative disc disease (01/22/2017), Major depression in remission ([place]) (03/10/2014), Migraines, Mitral regurgitation, Mixed hyperlipidemia (02/16/2014), Neck pain (01/24/2017), OSA (obstructive sleep apnea) (07/05/2016), Osteoarthritis of knee (08/12/2014), Prostate cancer ([place]) (2019), RA (rheumatoid arthritis) ([place]), Spinal stenosis of lumbar region with neurogenic claudication (04/18/2016), Spondylosis without myelopathy or  radiculopathy, lumbar region (01/22/2017), and Venous insufficiency of both lower extremities (11/07/2016). Surgical: Gary [name]  has a past surgical history that includes Cholecystectomy; Knee surgery (Left); deviated septum repair; Cardiac catheterization; Colonoscopy; Nasal septum surgery; Colonoscopy with propofol (N/A, 01/09/2018); and Esophagogastroduodenoscopy (egd) with propofol (N/A, 01/09/2018). Family: family history includes Heart disease in his  mother.  Laboratory Chemistry Profile   Renal Lab Results  Component Value Date   CREATININE 0.90 06/02/2020     Hepatic No results found for: AST, ALT, ALBUMIN, ALKPHOS, HCVAB, AMYLASE, LIPASE, AMMONIA   Electrolytes No results found for: NA, K, CL, CALCIUM, MG, PHOS   Bone Lab Results  Component Value Date   TESTOSTERONE <3 (L) 04/29/2018     Inflammation (CRP: Acute Phase) (ESR: Chronic Phase) No results found for: CRP, ESRSEDRATE, LATICACIDVEN     Note: Above Lab results reviewed.  Recent Imaging Review  VAS [place] ABI WITH/WO TBI  LOWER EXTREMITY DOPPLER STUDY  Patient Name:  Gary [name]  Date of Exam:   10/25/2020 Medical Rec #: [id number]          Accession #:    [id number] Date of Birth: 01-Sep-1950          Patient Gender: M Patient Age:   69Y Exam Location:  [facility] Procedure:      VAS [place] ABI WITH/WO TBI Referring Phys: [id number] Alexis  --------------------------------------------------------------------------------   Indications: Rest pain.   Performing Technologist: Charlane Ferretti RT (R)(VS)    Examination Guidelines: A complete evaluation includes at minimum, Doppler waveform signals and systolic blood pressure reading at the level of bilateral brachial, anterior tibial, and posterior tibial arteries, when vessel segments are accessible. Bilateral testing is considered an integral part of a complete examination. Photoelectric Plethysmograph (PPG) waveforms and toe systolic pressure readings are included as required and additional duplex testing as needed. Limited examinations for reoccurring indications may be performed as noted.    ABI Findings: +---------+------------------+-----+---------+--------+ Right    Rt Pressure (mmHg)IndexWaveform Comment  +---------+------------------+-----+---------+--------+ Brachial 150                                       +---------+------------------+-----+---------+--------+ ATA      168               1.12 triphasic         +---------+------------------+-----+---------+--------+ PTA      192               1.28 triphasic         +---------+------------------+-----+---------+--------+ Great Toe144               0.96 Normal            +---------+------------------+-----+---------+--------+  +---------+------------------+-----+---------+-------+ Left     Lt Pressure (mmHg)IndexWaveform Comment +---------+------------------+-----+---------+-------+ Brachial 150                                     +---------+------------------+-----+---------+-------+ ATA      149               0.99 triphasic        +---------+------------------+-----+---------+-------+ PTA      187               1.25 triphasic        +---------+------------------+-----+---------+-------+ [facility]  0.88 Normal           +---------+------------------+-----+---------+-------+  Summary: Right: Resting right ankle-brachial index is within normal range. No evidence of significant right lower extremity arterial disease. The right toe-brachial index is normal.  Left: Resting left ankle-brachial index is within normal range. No evidence of significant left lower extremity arterial disease. The left toe-brachial index is normal.  *See table(s) above for measurements and observations.    Electronically signed by Hortencia Pilar MD on 10/28/2020 at 5:07:40 PM.       Final   Note: Reviewed        Physical Exam  General appearance: Well nourished, well developed, and well hydrated. In no apparent acute distress Mental status: Alert, oriented x 3 (person, place, & time)       Respiratory: No evidence of acute respiratory distress Eyes: PERLA Vitals: BP (!) 144/97   Pulse (!) 108   Temp (!) 96.9 F (36.1 C) (Temporal)   Resp 18   Ht '5\' 7"'  (1.702 m)   Wt 219 lb (99.3 kg)    SpO2 94%   BMI 34.30 kg/m  BMI: Estimated body mass index is 34.3 kg/m as calculated from the following:   Height as of this encounter: '5\' 7"'  (1.702 m).   Weight as of this encounter: 219 lb (99.3 kg). Ideal: Ideal body weight: 66.1 kg (145 lb 11.6 oz) Adjusted ideal body weight: 79.4 kg (175 lb 0.6 oz)  Lumbar Spine Area Exam  Skin & Axial Inspection: No masses, redness, or swelling Alignment: Symmetrical Functional ROM: Pain restricted ROM affecting both sides Stability: No instability detected Muscle Tone/Strength: Functionally intact. No obvious neuro-muscular anomalies detected. Sensory (Neurological): Dermatomal pain pattern   Lower Extremity Exam      Side: Right lower extremity   Side: Left lower extremity  Stability: No instability observed           Stability: No instability observed          Skin & Extremity Inspection: Skin color, temperature, and hair growth are WNL. No peripheral edema or cyanosis. No masses, redness, swelling, asymmetry, or associated skin lesions. No contractures.   Skin & Extremity Inspection: Skin color, temperature, and hair growth are WNL. No peripheral edema or cyanosis. No masses, redness, swelling, asymmetry, or associated skin lesions. No contractures.  Functional ROM: Pain restricted ROM for hip and knee joints           Functional ROM: Pain restricted ROM for hip and knee joints          Muscle Tone/Strength: Functionally intact. No obvious neuro-muscular anomalies detected.   Muscle Tone/Strength: Functionally intact. No obvious neuro-muscular anomalies detected.  Sensory (Neurological): Dermatomal pain pattern + SLR        Sensory (Neurological): Dermatomal pain pattern, +SLR   DTR: Patellar: deferred today Achilles: deferred today Plantar: deferred today   DTR: Patellar: deferred today Achilles: deferred today Plantar: deferred today  Palpation: No palpable anomalies   Palpation: No palpable anomalies    5 out of 5 strength bilateral  lower extremity: Plantar flexion, dorsiflexion, knee flexion, knee extension.  Assessment   Status Diagnosis  Persistent Having a Flare-up Having a Flare-up 1. Chronic pain syndrome   2. Chronic radicular lumbar pain   3. Neuroforaminal stenosis of lumbar spine   4. Lumbar spondylosis   5. Bilateral leg pain   6. Pain medication agreement signed   7. Spinal stenosis of lumbar region with neurogenic claudication   8. Facet arthropathy, lumbar  9. Lumbar radiculopathy   10. Spondylosis without myelopathy or radiculopathy, lumbar region   11. SI (sacroiliac) joint dysfunction       Plan of Care   Gary [name] has a current medication list which includes the following long-term medication(s): albuterol sulfate, bupropion, esomeprazole, ezetimibe, fenofibrate, hydrochlorothiazide, potassium chloride, duloxetine, and gabapentin.  Pharmacotherapy (Medications Ordered): Meds ordered this encounter  Medications   oxyCODONE-acetaminophen (PERCOCET) 10-325 MG tablet    Sig: Take 1 tablet by mouth every 8 (eight) hours as needed for pain. Must last 30 days.    Dispense:  90 tablet    Refill:  0    [place] STOP ACT - Not applicable. Fill one day early if pharmacy is closed on scheduled refill date.   oxyCODONE-acetaminophen (PERCOCET) 10-325 MG tablet    Sig: Take 1 tablet by mouth every 8 (eight) hours as needed for pain. Must last 30 days.    Dispense:  90 tablet    Refill:  0    [place] STOP ACT - Not applicable. Fill one day early if pharmacy is closed on scheduled refill date.   gabapentin (NEURONTIN) 300 MG capsule    Sig: Take 2 capsules (600 mg total) by mouth at bedtime.    Dispense:  60 capsule    Refill:  4   DULoxetine (CYMBALTA) 60 MG capsule    Sig: Take 1 capsule (60 mg total) by mouth daily.    Dispense:  30 capsule    Refill:  2   Orders:  Orders Placed This Encounter  Procedures   Lumbar Epidural Injection    Standing Status:   Future    Standing Expiration  Date:   01/29/2021    Scheduling Instructions:     Procedure: Interlaminar Lumbar Epidural Steroid injection (LESI)            Laterality: Midline     Sedation: Patient's choice.     Timeframe: ASAA    Order Specific Question:   Where will this procedure be performed?    Answer:   [facility]   Follow-up plan:   Return in about 2 weeks (around 01/13/2021) for L-ESI , without sedation.     s/p b/l SI-J 12/02/2018- not helpful,  lumbar facets L3-S1 b/l; consider sprint peripheral nerve stimulation of lumbar medial branch lumbar facet arthropathy.        Recent Visits No visits were found meeting these conditions. Showing recent visits within past 90 days and meeting all other requirements Today's Visits Date Type Provider Dept  12/30/20 Office Visit Gary Santa, MD [facility]  Showing today's visits and meeting all other requirements Future Appointments No visits were found meeting these conditions. Showing future appointments within next 90 days and meeting all other requirements I discussed the assessment and treatment plan with the patient. The patient was provided an opportunity to ask questions and all were answered. The patient agreed with the plan and demonstrated an understanding of the instructions.  Patient advised to call back or seek an in-person evaluation if the symptoms or condition worsens.  Duration of encounter:30 minutes.  Note by: Gary Santa, MD Date: 12/30/2020; Time: 1:41 PM

## 2020-12-30 NOTE — Patient Instructions (Signed)

## 2021-01-06 ENCOUNTER — Encounter: Payer: Medicare HMO | Admitting: Student in an Organized Health Care Education/Training Program

## 2021-01-19 ENCOUNTER — Other Ambulatory Visit: Payer: Self-pay

## 2021-01-19 ENCOUNTER — Ambulatory Visit (HOSPITAL_BASED_OUTPATIENT_CLINIC_OR_DEPARTMENT_OTHER): Payer: Medicare HMO | Admitting: Student in an Organized Health Care Education/Training Program

## 2021-01-19 ENCOUNTER — Ambulatory Visit
Admission: RE | Admit: 2021-01-19 | Discharge: 2021-01-19 | Disposition: A | Discharge status: Home / Self Care | Payer: Medicare HMO | Source: Ambulatory Visit | Attending: Student in an Organized Health Care Education/Training Program | Admitting: Student in an Organized Health Care Education/Training Program

## 2021-01-19 VITALS — BP 117/75 | HR 85 | Temp 97.2°F | Resp 15 | Ht 67.0 in | Wt 220.0 lb

## 2021-01-19 DIAGNOSIS — G8929 Other chronic pain: Secondary | ICD-10-CM

## 2021-01-19 DIAGNOSIS — M5416 Radiculopathy, lumbar region: Secondary | ICD-10-CM | POA: Insufficient documentation

## 2021-01-19 DIAGNOSIS — M48062 Spinal stenosis, lumbar region with neurogenic claudication: Secondary | ICD-10-CM | POA: Diagnosis not present

## 2021-01-19 DIAGNOSIS — M48061 Spinal stenosis, lumbar region without neurogenic claudication: Secondary | ICD-10-CM | POA: Diagnosis not present

## 2021-01-19 DIAGNOSIS — G4733 Obstructive sleep apnea (adult) (pediatric): Secondary | ICD-10-CM

## 2021-01-19 DIAGNOSIS — G894 Chronic pain syndrome: Secondary | ICD-10-CM | POA: Diagnosis not present

## 2021-01-19 MED ORDER — IOHEXOL 180 MG/ML  SOLN
10.0000 mL | Freq: Once | INTRAMUSCULAR | Status: AC
  Administered 2021-01-19: 10 mL via EPIDURAL
  Filled 2021-01-19: qty 10

## 2021-01-19 MED ORDER — LIDOCAINE HCL 2 % IJ SOLN
20.0000 mL | Freq: Once | INTRAMUSCULAR | Status: AC
  Administered 2021-01-19: 400 mg
  Filled 2021-01-19: qty 20

## 2021-01-19 MED ORDER — ROPIVACAINE HCL 2 MG/ML IJ SOLN
INTRAMUSCULAR | Status: AC
  Filled 2021-01-19: qty 20

## 2021-01-19 MED ORDER — DEXAMETHASONE SODIUM PHOSPHATE 10 MG/ML IJ SOLN
10.0000 mg | Freq: Once | INTRAMUSCULAR | Status: AC
  Administered 2021-01-19: 10 mg
  Filled 2021-01-19: qty 1

## 2021-01-19 MED ORDER — SODIUM CHLORIDE 0.9% FLUSH
2.0000 mL | Freq: Once | INTRAVENOUS | Status: AC
  Administered 2021-01-19: 2 mL

## 2021-01-19 MED ORDER — ROPIVACAINE HCL 2 MG/ML IJ SOLN
2.0000 mL | Freq: Once | INTRAMUSCULAR | Status: AC
  Administered 2021-01-19: 2 mL via EPIDURAL

## 2021-01-19 MED ORDER — SODIUM CHLORIDE (PF) 0.9 % IJ SOLN
INTRAMUSCULAR | Status: AC
  Filled 2021-01-19: qty 10

## 2021-01-19 NOTE — Patient Instructions (Signed)

## 2021-01-19 NOTE — Progress Notes (Signed)
Safety precautions to be maintained throughout the outpatient stay will include: orient to surroundings, keep bed in low position, maintain call bell within reach at all times, provide assistance with transfer out of bed and ambulation.  

## 2021-01-19 NOTE — Progress Notes (Signed)
PROVIDER NOTE: Information contained herein reflects review and annotations entered in association with encounter. Interpretation of such information and data should be left to medically-trained personnel. Information provided to patient can be located elsewhere in the medical record under "Patient Instructions". Document created using STT-dictation technology, any transcriptional errors that may result from process are unintentional.    Patient: Gary [name]  Service Category: Procedure  Provider: Gillis Santa, MD  DOB: 03-Mar-1951  DOS: 01/19/2021  Location: [facility]  MRN: [id number]  Setting: Ambulatory - outpatient  Referring Provider: Gillis Santa, MD  Type: Established Patient  Specialty: Interventional Pain Management  PCP: Kirk Ruths, MD   Primary Reason for Visit: Interventional Pain Management Treatment. CC: Back Pain   Procedure:          Anesthesia, Analgesia, Anxiolysis:  Type: Therapeutic Inter-Laminar Epidural Steroid Injection           Region: Lumbar Level: L3-4 Level. Laterality: Midline         Type: Local Anesthesia Local Anesthetic: Lidocaine 1-2%   Position: Prone with head of the table was raised to facilitate breathing.   Indications: 1. Chronic radicular lumbar pain   2. Chronic pain syndrome   3. Spinal stenosis of lumbar region with neurogenic claudication   4. Neuroforaminal stenosis of lumbar spine   5. OSA (obstructive sleep apnea)    Pain Score: Pre-procedure: 7 /10 Post-procedure: 7 /10    Pre-op H&P Assessment:  Gary [name] is a 70 y.o. (year old), male patient, seen today for interventional treatment. He  has a past surgical history that includes Cholecystectomy; Knee surgery (Left); deviated septum repair; Cardiac catheterization; Colonoscopy; Nasal septum surgery; Colonoscopy with propofol (N/A, 01/09/2018); and Esophagogastroduodenoscopy (egd) with propofol (N/A, 01/09/2018). Gary [name] has a current medication list  which includes the following prescription(s): acetaminophen, albuterol sulfate, bupropion, buspirone, cholecalciferol, clobetasol ointment, clotrimazole-betamethasone, diclofenac sodium, duloxetine, esomeprazole, ezetimibe, fenofibrate, finasteride, gabapentin, hydrochlorothiazide, magnesium, multivitamin, [START ON 02/04/2021] oxycodone-acetaminophen, [START ON 03/06/2021] oxycodone-acetaminophen, potassium chloride, sildenafil, tamsulosin, and nexletol, and the following Facility-Administered Medications: leuprolide (6 month). His primarily concern today is the Back Pain  Initial Vital Signs:  Pulse/HCG Rate: 85ECG Heart Rate: 80 Temp:  (!) 97.2 F (36.2 C) Resp: 18 BP: 121/79 SpO2: 98 %  BMI: Estimated body mass index is 34.46 kg/m as calculated from the following:   Height as of this encounter: 5\' 7"  (1.702 m).   Weight as of this encounter: 220 lb (99.8 kg).  Risk Assessment: Allergies: Reviewed. He is allergic to niacin-lovastatin er, atorvastatin, other, simvastatin, and propoxyphene.  Allergy Precautions: None required Coagulopathies: Reviewed. None identified.  Blood-thinner therapy: None at this time Active Infection(s): Reviewed. None identified. Gary [name] is afebrile  Site Confirmation: Gary [name] was asked to confirm the procedure and laterality before marking the site Procedure checklist: Completed Consent: Before the procedure and under the influence of no sedative(s), amnesic(s), or anxiolytics, the patient was informed of the treatment options, risks and possible complications. To fulfill our ethical and legal obligations, as recommended by the American Medical Association's Code of Ethics, I have informed the patient of my clinical impression; the nature and purpose of the treatment or procedure; the risks, benefits, and possible complications of the intervention; the alternatives, including doing nothing; the risk(s) and benefit(s) of the alternative treatment(s) or  procedure(s); and the risk(s) and benefit(s) of doing nothing. The patient was provided information about the general risks and possible complications associated with the procedure. These may include, but  are not limited to: failure to achieve desired goals, infection, bleeding, organ or nerve damage, allergic reactions, paralysis, and death. In addition, the patient was informed of those risks and complications associated to Spine-related procedures, such as failure to decrease pain; infection (i.e.: Meningitis, epidural or intraspinal abscess); bleeding (i.e.: epidural hematoma, subarachnoid hemorrhage, or any other type of intraspinal or peri-dural bleeding); organ or nerve damage (i.e.: Any type of peripheral nerve, nerve root, or spinal cord injury) with subsequent damage to sensory, motor, and/or autonomic systems, resulting in permanent pain, numbness, and/or weakness of one or several areas of the body; allergic reactions; (i.e.: anaphylactic reaction); and/or death. Furthermore, the patient was informed of those risks and complications associated with the medications. These include, but are not limited to: allergic reactions (i.e.: anaphylactic or anaphylactoid reaction(s)); adrenal axis suppression; blood sugar elevation that in diabetics may result in ketoacidosis or comma; water retention that in patients with history of congestive heart failure may result in shortness of breath, pulmonary edema, and decompensation with resultant heart failure; weight gain; swelling or edema; medication-induced neural toxicity; particulate matter embolism and blood vessel occlusion with resultant organ, and/or nervous system infarction; and/or aseptic necrosis of one or more joints. Finally, the patient was informed that Medicine is not an exact science; therefore, there is also the possibility of unforeseen or unpredictable risks and/or possible complications that may result in a catastrophic outcome. The patient  indicated having understood very clearly. We have given the patient no guarantees and we have made no promises. Enough time was given to the patient to ask questions, all of which were answered to the patient's satisfaction. Gary [name] has indicated that he wanted to continue with the procedure. Attestation: I, the ordering provider, attest that I have discussed with the patient the benefits, risks, side-effects, alternatives, likelihood of achieving goals, and potential problems during recovery for the procedure that I have provided informed consent. Date  Time: 01/19/2021 11:03 AM  Pre-Procedure Preparation:  Monitoring: As per clinic protocol. Respiration, ETCO2, SpO2, BP, heart rate and rhythm monitor placed and checked for adequate function Safety Precautions: Patient was assessed for positional comfort and pressure points before starting the procedure. Time-out: I initiated and conducted the "Time-out" before starting the procedure, as per protocol. The patient was asked to participate by confirming the accuracy of the "Time Out" information. Verification of the correct person, site, and procedure were performed and confirmed by me, the nursing staff, and the patient. "Time-out" conducted as per Joint Commission's Universal Protocol (UP.01.01.01). Time: 1146  Description of Procedure:          Target Area: The interlaminar space, initially targeting the lower laminar border of the superior vertebral body. Approach: Paramedial approach. Area Prepped: Entire Posterior Lumbar Region DuraPrep (Iodine Povacrylex [0.7% available iodine] and Isopropyl Alcohol, 74% w/w) Safety Precautions: Aspiration looking for blood return was conducted prior to all injections. At no point did we inject any substances, as a needle was being advanced. No attempts were made at seeking any paresthesias. Safe injection practices and needle disposal techniques used. Medications properly checked for expiration dates. SDV  (single dose vial) medications used. Description of the Procedure: Protocol guidelines were followed. The procedure needle was introduced through the skin, ipsilateral to the reported pain, and advanced to the target area. Bone was contacted and the needle walked caudad, until the lamina was cleared. The epidural space was identified using "loss-of-resistance technique" with 2-3 ml of PF-NaCl (0.9% NSS), in a 5cc LOR glass syringe.  Vitals:   01/19/21 1116 01/19/21 1145 01/19/21 1147 01/19/21 1151  BP: 121/79 118/81 124/90 117/75  Pulse: 85     Resp: 18 11 14 15   Temp: (!) 97.2 F (36.2 C)     SpO2: 98% 92% 93% 93%  Weight: 220 lb (99.8 kg)     Height: 5\' 7"  (1.702 m)       Start Time: 1146 hrs. End Time: 1150 hrs.  Materials:  Needle(s) Type: Epidural needle Gauge: 22G Length: 3.5-in Medication(s): Please see orders for medications and dosing details. 6 cc solution made of 3 cc of preservative-free saline, 2 cc of 0.2% ropivacaine, 1 cc of Decadron 10 mg/cc.  Imaging Guidance (Spinal):          Type of Imaging Technique: Fluoroscopy Guidance (Spinal) Indication(s): Assistance in needle guidance and placement for procedures requiring needle placement in or near specific anatomical locations not easily accessible without such assistance. Exposure Time: Please see nurses notes. Contrast: Before injecting any contrast, we confirmed that the patient did not have an allergy to iodine, shellfish, or radiological contrast. Once satisfactory needle placement was completed at the desired level, radiological contrast was injected. Contrast injected under live fluoroscopy. No contrast complications. See chart for type and volume of contrast used. Fluoroscopic Guidance: I was personally present during the use of fluoroscopy. "Tunnel Vision Technique" used to obtain the best possible view of the target area. Parallax error corrected before commencing the procedure. "Direction-depth-direction"  technique used to introduce the needle under continuous pulsed fluoroscopy. Once target was reached, antero-posterior, oblique, and lateral fluoroscopic projection used confirm needle placement in all planes. Images permanently stored in EMR. Interpretation: I personally interpreted the imaging intraoperatively. Adequate needle placement confirmed in multiple planes. Appropriate spread of contrast into desired area was observed. No evidence of afferent or efferent intravascular uptake. No intrathecal or subarachnoid spread observed. Permanent images saved into the patient's record.  Post-operative Assessment:  Post-procedure Vital Signs:  Pulse/HCG Rate: 8582 Temp:  (!) 97.2 F (36.2 C) Resp: 15 BP: 117/75 SpO2: 93 %  EBL: None  Complications: No immediate post-treatment complications observed by team, or reported by patient.  Note: The patient tolerated the entire procedure well. A repeat set of vitals were taken after the procedure and the patient was kept under observation following institutional policy, for this type of procedure. Post-procedural neurological assessment was performed, showing return to baseline, prior to discharge. The patient was provided with post-procedure discharge instructions, including a section on how to identify potential problems. Should any problems arise concerning this procedure, the patient was given instructions to immediately contact us, at any time, without hesitation. In any case, we plan to contact the patient by telephone for a follow-up status report regarding this interventional procedure.  Comments:  No additional relevant information.  Plan of Care  Orders:  Orders Placed This Encounter  Procedures   DG PAIN CLINIC C-ARM 1-60 MIN NO REPORT    Intraoperative interpretation by procedural physician at [place].    Standing Status:   Standing    Number of Occurrences:   1    Order Specific Question:   Reason for exam:    Answer:    Assistance in needle guidance and placement for procedures requiring needle placement in or near specific anatomical locations not easily accessible without such assistance.   Split night study    Contact [facility]. Tel.: [phone] [address]  [place]  [place],  [place] [address]    Standing Status:   Future    Standing Expiration Date:   03/21/2021    Scheduling Instructions:     Referral is for: Evaluation and treatment of Sleep Apnea, unspecified (G47.30).      Note: If CPAP is indicated, please order and manage.     Provider preference: No preference    Order Specific Question:   Where should this test be performed:    Answer:   [place]   Chronic Opioid Analgesic:  Oxycodone 10 mg TID prn, #90/month, MME=45   Medications ordered for procedure: Meds ordered this encounter  Medications   iohexol (OMNIPAQUE) 180 MG/ML injection 10 mL    Must be Myelogram-compatible. If not available, you may substitute with a water-soluble, non-ionic, hypoallergenic, myelogram-compatible radiological contrast medium.   lidocaine (XYLOCAINE) 2 % (with pres) injection 400 mg   sodium chloride flush (NS) 0.9 % injection 2 mL   ropivacaine (PF) 2 mg/mL (0.2%) (NAROPIN) injection 2 mL   dexamethasone (DECADRON) injection 10 mg   Medications administered: We administered iohexol, lidocaine, sodium chloride flush, ropivacaine (PF) 2 mg/mL (0.2%), and dexamethasone.  See the medical record for exact dosing, route, and time of administration.  Follow-up plan:   Return in about 4 weeks (around 02/16/2021) for Post Procedure Evaluation, virtual.       s/p b/l SI-J 12/02/2018- not helpful,  lumbar facets L3-S1 b/l; consider sprint peripheral nerve stimulation of lumbar medial branch lumbar facet arthropathy.         Recent Visits Date Type Provider Dept  12/30/20 Office Visit Gillis Santa, MD [facility]  Showing recent visits within  past 90 days and meeting all other requirements Today's Visits Date Type Provider Dept  01/19/21 Procedure visit Gillis Santa, MD [facility]  Showing today's visits and meeting all other requirements Future Appointments Date Type Provider Dept  03/31/21 Appointment Gillis Santa, MD [facility]  Showing future appointments within next 90 days and meeting all other requirements Disposition: Discharge home  Discharge (Date  Time): 01/19/2021;   hrs.   Primary Care Physician: Kirk Ruths, MD Location: [facility] Note by: Gillis Santa, MD Date: 01/19/2021; Time: 11:53 AM  Disclaimer:  Medicine is not an exact science. The only guarantee in medicine is that nothing is guaranteed. It is important to note that the decision to proceed with this intervention was based on the information collected from the patient. The Data and conclusions were drawn from the patient's questionnaire, the interview, and the physical examination. Because the information was provided in large part by the patient, it cannot be guaranteed that it has not been purposely or unconsciously manipulated. Every effort has been made to obtain as much relevant data as possible for this evaluation. It is important to note that the conclusions that lead to this procedure are derived in large part from the available data. Always take into account that the treatment will also be dependent on availability of resources and existing treatment guidelines, considered by other Pain Management Practitioners as being common knowledge and practice, at the time of the intervention. For Medico-Legal purposes, it is also important to point out that variation in procedural techniques and pharmacological choices are the acceptable norm. The indications, contraindications, technique, and results of the above procedure should only be interpreted and judged by a Board-Certified Interventional Pain  Specialist with extensive familiarity and expertise in the same exact procedure and technique.

## 2021-01-20 ENCOUNTER — Telehealth: Payer: Self-pay | Admitting: *Deleted

## 2021-01-20 NOTE — Telephone Encounter (Signed)
Post procedure call, voicemail left with patient to please call the office if there are any questions or concerns.

## 2021-02-16 ENCOUNTER — Other Ambulatory Visit: Payer: Self-pay

## 2021-02-16 ENCOUNTER — Ambulatory Visit
Payer: Medicare HMO | Attending: Student in an Organized Health Care Education/Training Program | Admitting: Student in an Organized Health Care Education/Training Program

## 2021-02-16 ENCOUNTER — Encounter: Payer: Self-pay | Admitting: Student in an Organized Health Care Education/Training Program

## 2021-02-16 DIAGNOSIS — G8929 Other chronic pain: Secondary | ICD-10-CM

## 2021-02-16 DIAGNOSIS — M5416 Radiculopathy, lumbar region: Secondary | ICD-10-CM

## 2021-02-16 DIAGNOSIS — Z0289 Encounter for other administrative examinations: Secondary | ICD-10-CM | POA: Diagnosis not present

## 2021-02-16 DIAGNOSIS — M48061 Spinal stenosis, lumbar region without neurogenic claudication: Secondary | ICD-10-CM | POA: Diagnosis not present

## 2021-02-16 DIAGNOSIS — M79604 Pain in right leg: Secondary | ICD-10-CM

## 2021-02-16 DIAGNOSIS — M48062 Spinal stenosis, lumbar region with neurogenic claudication: Secondary | ICD-10-CM

## 2021-02-16 DIAGNOSIS — M47816 Spondylosis without myelopathy or radiculopathy, lumbar region: Secondary | ICD-10-CM

## 2021-02-16 DIAGNOSIS — M79605 Pain in left leg: Secondary | ICD-10-CM

## 2021-02-16 DIAGNOSIS — G4733 Obstructive sleep apnea (adult) (pediatric): Secondary | ICD-10-CM

## 2021-02-16 DIAGNOSIS — G894 Chronic pain syndrome: Secondary | ICD-10-CM

## 2021-02-16 NOTE — Progress Notes (Signed)
Patient: Gary [name]  Service Category: E/M  Provider: Gillis Santa, MD  DOB: 1950/05/02  DOS: 02/16/2021  Location: Office  MRN: [id number]  Setting: Ambulatory outpatient  Referring Provider: Kirk Ruths, MD  Type: Established Patient  Specialty: Interventional Pain Management  PCP: Kirk Ruths, MD  Location: Home  Delivery: TeleHealth     Virtual Encounter - Pain Management PROVIDER NOTE: Information contained herein reflects review and annotations entered in association with encounter. Interpretation of such information and data should be left to medically-trained personnel. Information provided to patient can be located elsewhere in the medical record under "Patient Instructions". Document created using STT-dictation technology, any transcriptional errors that may result from process are unintentional.    Contact & Pharmacy Preferred: [phone] Home: [phone] (home) Mobile: [phone] (mobile) E-mail: No e-mail address on record  [place], [place] [address] [place] [address] Phone: [phone] Fax: [phone]   Pre-screening  Mr. Mancel Bale offered "in-person" vs "virtual" encounter. He indicated preferring virtual for this encounter.   Reason COVID-19*  Social distancing based on CDC and AMA recommendations.   I contacted Charlean Merl on 02/16/2021 via telephone.      I clearly identified myself as Gillis Santa, MD. I verified that I was speaking with the correct person using two identifiers (Name: TIARA BARTOLI, and date of birth: [birth year]).  Consent I sought verbal advanced consent from Charlean Merl for virtual visit interactions. I informed Mr. Pipe of possible security and privacy concerns, risks, and limitations associated with providing "not-in-person" medical evaluation and management services. I also informed Mr. Hallisey of the availability of "in-person" appointments. Finally, I  informed him that there would be a charge for the virtual visit and that he could be  personally, fully or partially, financially responsible for it. Mr. Say expressed understanding and agreed to proceed.   Historic Elements   Mr. HAWK MONES is a 70 y.o. year old, male patient evaluated today after our last contact on 01/19/2021. Mr. Chevalier  has a past medical history of Anxiety, Asthma, Benign essential HTN (06/24/2015), BPH (benign prostatic hyperplasia), COPD (chronic obstructive pulmonary disease) ([place]), Coronary artery disease (02/16/2014), DDD (degenerative disc disease), cervical, Degenerative disc disease, cervical (01/22/2017), Depression, DJD (degenerative joint disease), Emphysema of lung ([place]), Heel pain (10/10/2017), Hyperglobulinemia, Lumbar degenerative disc disease (01/22/2017), Major depression in remission ([place]) (03/10/2014), Migraines, Mitral regurgitation, Mixed hyperlipidemia (02/16/2014), Neck pain (01/24/2017), OSA (obstructive sleep apnea) (07/05/2016), Osteoarthritis of knee (08/12/2014), Prostate cancer ([place]) (2019), RA (rheumatoid arthritis) ([place]), Spinal stenosis of lumbar region with neurogenic claudication (04/18/2016), Spondylosis without myelopathy or radiculopathy, lumbar region (01/22/2017), and Venous insufficiency of both lower extremities (11/07/2016). He also  has a past surgical history that includes Cholecystectomy; Knee surgery (Left); deviated septum repair; Cardiac catheterization; Colonoscopy; Nasal septum surgery; Colonoscopy with propofol (N/A, 01/09/2018); and Esophagogastroduodenoscopy (egd) with propofol (N/A, 01/09/2018). Mr. Gatley has a current medication list which includes the following prescription(s): acetaminophen, albuterol sulfate, nexletol, bupropion, buspirone, cholecalciferol, clobetasol ointment, clotrimazole-betamethasone, diclofenac sodium, duloxetine, esomeprazole, ezetimibe, fenofibrate, finasteride, gabapentin, hydrochlorothiazide, magnesium,  multivitamin, oxycodone-acetaminophen, [START ON 03/06/2021] oxycodone-acetaminophen, potassium chloride, sildenafil, and tamsulosin, and the following Facility-Administered Medications: leuprolide (6 month). He  reports that he quit smoking about 8 years ago. His smoking use included cigarettes. He has a 67.50 pack-year smoking history. He has never used smokeless tobacco. He reports that he does not drink alcohol and does not use drugs. Mr. Armenti is allergic  to niacin-lovastatin er, atorvastatin, other, simvastatin, and propoxyphene.   HPI  Today, he is being contacted for a post-procedure assessment.   Post-Procedure Evaluation  Procedure (01/19/2021):   Type: Therapeutic Inter-Laminar Epidural Steroid Injection           Region: Lumbar Level: L3-4 Level. Laterality: Midline       Anxiolysis: Please see nurses note.  Effectiveness during initial hour after procedure (Ultra-Short Term Relief): 100 %   Local anesthetic used: Long-acting (4-6 hours) Effectiveness: Defined as any analgesic benefit obtained secondary to the administration of local anesthetics. This carries significant diagnostic value as to the etiological location, or anatomical origin, of the pain. Duration of benefit is expected to coincide with the duration of the local anesthetic used.  Effectiveness during initial 4-6 hours after procedure (Short-Term Relief): 100 %   Long-term benefit: Defined as any relief past the pharmacologic duration of the local anesthetics.  Effectiveness past the initial 6 hours after procedure (Long-Term Relief): 0 %   Benefits, current: Defined as benefit present at the time of this evaluation.   Analgesia:  0% Function: No benefit  Laboratory Chemistry Profile   Renal Lab Results  Component Value Date   CREATININE 0.90 06/02/2020    Hepatic No results found for: AST, ALT, ALBUMIN, ALKPHOS, HCVAB, AMYLASE, LIPASE, AMMONIA  Electrolytes No results found for: NA, K, CL, CALCIUM,  MG, PHOS  Bone Lab Results  Component Value Date   TESTOSTERONE <3 (L) 04/29/2018    Inflammation (CRP: Acute Phase) (ESR: Chronic Phase) No results found for: CRP, ESRSEDRATE, LATICACIDVEN       Note: Above Lab results reviewed.  Assessment  The primary encounter diagnosis was Chronic radicular lumbar pain. Diagnoses of Spinal stenosis of lumbar region with neurogenic claudication, Neuroforaminal stenosis of lumbar spine, OSA (obstructive sleep apnea), Lumbar spondylosis, Bilateral leg pain, Pain medication agreement signed, and Chronic pain syndrome were also pertinent to this visit.  Plan of Care  Unfortunately no benefit with L3-L4 ESI.  Continue with medication management.  Follow-up as scheduled next month.   Follow-up plan:   No follow-ups on file.     s/p b/l SI-J 12/02/2018- not helpful,  lumbar facets L3-S1 b/l; consider sprint peripheral nerve stimulation of lumbar medial branch lumbar facet arthropathy.          Recent Visits Date Type Provider Dept  01/19/21 Procedure visit Gillis Santa, MD [facility]  12/30/20 Office Visit Gillis Santa, MD [facility]  Showing recent visits within past 90 days and meeting all other requirements Today's Visits Date Type Provider Dept  02/16/21 Office Visit Gillis Santa, MD [facility]  Showing today's visits and meeting all other requirements Future Appointments Date Type Provider Dept  03/31/21 Appointment Gillis Santa, MD [facility]  Showing future appointments within next 90 days and meeting all other requirements I discussed the assessment and treatment plan with the patient. The patient was provided an opportunity to ask questions and all were answered. The patient agreed with the plan and demonstrated an understanding of the instructions.  Patient advised to call back or seek an in-person evaluation if the symptoms or condition worsens.  Duration of encounter: 40mnutes.  Note by:  BGillis Santa MD Date: 02/16/2021; Time: 2:45 PM

## 2021-03-26 ENCOUNTER — Other Ambulatory Visit: Payer: Self-pay | Admitting: Student in an Organized Health Care Education/Training Program

## 2021-03-31 ENCOUNTER — Encounter: Payer: Medicare HMO | Admitting: Student in an Organized Health Care Education/Training Program

## 2021-04-14 ENCOUNTER — Ambulatory Visit
Payer: Medicare HMO | Attending: Student in an Organized Health Care Education/Training Program | Admitting: Student in an Organized Health Care Education/Training Program

## 2021-04-14 ENCOUNTER — Other Ambulatory Visit: Payer: Self-pay

## 2021-04-14 ENCOUNTER — Encounter: Payer: Self-pay | Admitting: Student in an Organized Health Care Education/Training Program

## 2021-04-14 VITALS — BP 125/84 | HR 84 | Temp 96.4°F | Resp 15 | Ht 67.0 in | Wt 220.0 lb

## 2021-04-14 DIAGNOSIS — G8929 Other chronic pain: Secondary | ICD-10-CM

## 2021-04-14 DIAGNOSIS — M47816 Spondylosis without myelopathy or radiculopathy, lumbar region: Secondary | ICD-10-CM | POA: Diagnosis present

## 2021-04-14 DIAGNOSIS — M5416 Radiculopathy, lumbar region: Secondary | ICD-10-CM | POA: Insufficient documentation

## 2021-04-14 DIAGNOSIS — G4733 Obstructive sleep apnea (adult) (pediatric): Secondary | ICD-10-CM | POA: Diagnosis present

## 2021-04-14 DIAGNOSIS — M48062 Spinal stenosis, lumbar region with neurogenic claudication: Secondary | ICD-10-CM

## 2021-04-14 DIAGNOSIS — G894 Chronic pain syndrome: Secondary | ICD-10-CM | POA: Diagnosis present

## 2021-04-14 DIAGNOSIS — M48061 Spinal stenosis, lumbar region without neurogenic claudication: Secondary | ICD-10-CM

## 2021-04-14 MED ORDER — OXYCODONE-ACETAMINOPHEN 10-325 MG PO TABS: 1.0000 | ORAL_TABLET | Freq: Three times a day (TID) | ORAL | 0 refills | Status: AC | PRN

## 2021-04-14 MED ORDER — GABAPENTIN 300 MG PO CAPS: 600.0000 mg | ORAL_CAPSULE | Freq: Every day | ORAL | 11 refills | Status: DC

## 2021-04-14 MED ORDER — OXYCODONE-ACETAMINOPHEN 10-325 MG PO TABS: 1.0000 | ORAL_TABLET | Freq: Three times a day (TID) | ORAL | 0 refills | Status: DC | PRN

## 2021-04-14 MED ORDER — DULOXETINE HCL 60 MG PO CPEP: 60.0000 mg | ORAL_CAPSULE | Freq: Every day | ORAL | 11 refills | Status: DC

## 2021-04-14 NOTE — Progress Notes (Signed)
Nursing Pain Medication Assessment:  Safety precautions to be maintained throughout the outpatient stay will include: orient to surroundings, keep bed in low position, maintain call bell within reach at all times, provide assistance with transfer out of bed and ambulation.  Medication Inspection Compliance: Pill count conducted under aseptic conditions, in front of the patient. Neither the pills nor the bottle was removed from the patient's sight at any time. Once count was completed pills were immediately returned to the patient in their original bottle.  Medication: Oxycodone/APAP Pill/Patch Count:  65 of 90 pills remain Pill/Patch Appearance: Markings consistent with prescribed medication Bottle Appearance: Standard pharmacy container. Clearly labeled. Filled Date: 24 / 28 / 2022 Last Medication intake:  TodaySafety precautions to be maintained throughout the outpatient stay will include: orient to surroundings, keep bed in low position, maintain call bell within reach at all times, provide assistance with transfer out of bed and ambulation.

## 2021-04-14 NOTE — Progress Notes (Signed)
PROVIDER NOTE: Information contained herein reflects review and annotations entered in association with encounter. Interpretation of such information and data should be left to medically-trained personnel. Information provided to patient can be located elsewhere in the medical record under "Patient Instructions". Document created using STT-dictation technology, any transcriptional errors that may result from process are unintentional.    Patient: Gary [name]  Service Category: E/M  Provider: Gillis Santa, MD  DOB: February 17, 1951  DOS: 04/14/2021  Specialty: Interventional Pain Management  MRN: [id number]  Setting: Ambulatory outpatient  PCP: Gary Ruths, MD  Type: Established Patient    Referring Provider: Kirk Ruths, MD  Location: Office  Delivery: Face-to-face     HPI  [name], a 71 y.o. year old male, is here today because of his Chronic radicular lumbar pain [M54.16, G89.29]. [name] primary complain today is Back Pain (lower)  Last encounter: My last encounter with him was on 02/16/21  Pertinent problems: [name] has Lumbar spondylosis; Lumbar degenerative disc disease; Degenerative disc disease, cervical; Chronic pain syndrome; Facet arthropathy, lumbar; Spinal stenosis of lumbar region with neurogenic claudication; Chronic bilateral low back pain with left-sided sciatica; Chronic left sacroiliac joint pain; Chronic hip pain, left; Cervical spondylosis with radiculopathy; Groin pain, chronic, right; and Chronic left shoulder pain on their pertinent problem list. Pain Assessment: Severity of Chronic pain is reported as a 8 /10. Location: Back Left, Right/pain radiaties down both leg to his feet. Onset: More than a month ago. Quality: Aching, Burning, Constant, Throbbing. Timing: Constant. Modifying factor(s): Meds help little. Vitals:  height is '5\' 7"'  (1.702 m) and weight is 220 lb (99.8 kg). His temperature is 96.4 F (35.8 C) (abnormal). His blood  pressure is 125/84 and his pulse is 84. His respiration is 15 and oxygen saturation is 95%.   Reason for encounter: medication management.    -persistent bilateral lumbar radicular pain, radiating to posterolateral calf on left leg and right ankle refractory to epidural steroid injection. -he is the primary caregiver of his wife who has many health issues and is morbidly obese -Refill of Gabapentin, Oxycodone, and Cymbalta as below.  States that it provides pain relief and improvement in functional status.    Pharmacotherapy Assessment  Analgesic:  Percocet 10 mg TID prn, #90/month, MME=45   Monitoring: [place] PMP: PDMP reviewed during this encounter.       Pharmacotherapy: No side-effects or adverse reactions reported. Compliance: No problems identified. Effectiveness: Clinically acceptable.  UDS:  Summary  Date Value Ref Range Status  09/21/2020 Note  Final    Comment:    ==================================================================== [employer] [facility] 13 (MW) ==================================================================== Test                             Result       Flag       Units  Drug Present and Declared for Prescription Verification   Oxycodone                      1267         EXPECTED   ng/mg creat   Oxymorphone                    493          EXPECTED   ng/mg creat   Noroxycodone  1407         EXPECTED   ng/mg creat   Noroxymorphone                 213          EXPECTED   ng/mg creat    Sources of oxycodone are scheduled prescription medications.    Oxymorphone, noroxycodone, and noroxymorphone are expected    metabolites of oxycodone. Oxymorphone is also available as a    scheduled prescription medication.  ==================================================================== Test                      Result    Flag   Units      Ref Range   Creatinine              101              mg/dL       >=20 ==================================================================== Declared Medications:  The flagging and interpretation on this report are based on the  following declared medications.  Unexpected results may arise from  inaccuracies in the declared medications.   **Note: The testing scope of this panel includes these medications:   Oxycodone (Percocet)   **Note: The testing scope of this panel does not include the  following reported medications:   Acetaminophen (Tylenol)  Acetaminophen (Percocet)  Albuterol (Proair HFA)  Bempedoic Acid (Nexletol)  Betamethasone (Lotrisone)  Bupropion (Wellbutrin)  Buspirone (Buspar)  Clobetasol  Clotrimazole (Lotrisone)  Diclofenac (Voltaren)  Duloxetine (Cymbalta)  Esomeprazole (Nexium)  Ezetimibe (Zetia)  Fenofibrate (TriCor)  Finasteride (Proscar)  Gabapentin (Neurontin)  Hydrochlorothiazide (Hydrodiuril)  Multivitamin  Mupirocin (Bactroban)  Potassium (Klor-Con)  Sildenafil  Tamsulosin (Flomax)  Vitamin D ==================================================================== For clinical consultation, please call [phone]. ====================================================================       ROS  Constitutional:  low back pain, Bilateral leg, groin pain worse with exertion Gastrointestinal: No reported hemesis, hematochezia, vomiting, or acute GI distress Musculoskeletal: Denies any acute onset joint swelling, redness, loss of ROM, or weakness Neurological:  LE radicular pain  Medication Review  Albuterol Sulfate, Bempedoic Acid, DULoxetine, Magnesium, acetaminophen, buPROPion, busPIRone, cholecalciferol, clobetasol ointment, clotrimazole-betamethasone, diclofenac sodium, esomeprazole, ezetimibe, fenofibrate, finasteride, gabapentin, hydrochlorothiazide, multivitamin, oxyCODONE-acetaminophen, potassium chloride, sildenafil, and tamsulosin  History Review  Allergy: [name] is allergic to  niacin-lovastatin er, atorvastatin, other, simvastatin, and propoxyphene. Drug: [name]  reports no history of drug use. Alcohol:  reports no history of alcohol use. Tobacco:  reports that he quit smoking about 9 years ago. His smoking use included cigarettes. He has a 67.50 pack-year smoking history. He has never used smokeless tobacco. Social: [name]  reports that he quit smoking about 9 years ago. His smoking use included cigarettes. He has a 67.50 pack-year smoking history. He has never used smokeless tobacco. He reports that he does not drink alcohol and does not use drugs. Medical:  has a past medical history of Anxiety, Asthma, Benign essential HTN (06/24/2015), BPH (benign prostatic hyperplasia), COPD (chronic obstructive pulmonary disease) ([place]), Coronary artery disease (02/16/2014), DDD (degenerative disc disease), cervical, Degenerative disc disease, cervical (01/22/2017), Depression, DJD (degenerative joint disease), Emphysema of lung ([place]), Heel pain (10/10/2017), Hyperglobulinemia, Lumbar degenerative disc disease (01/22/2017), Major depression in remission ([place]) (03/10/2014), Migraines, Mitral regurgitation, Mixed hyperlipidemia (02/16/2014), Neck pain (01/24/2017), OSA (obstructive sleep apnea) (07/05/2016), Osteoarthritis of knee (08/12/2014), Prostate cancer ([place]) (2019), RA (rheumatoid arthritis) ([place]), Spinal stenosis of lumbar region with neurogenic claudication (04/18/2016), Spondylosis without myelopathy or radiculopathy, lumbar region (01/22/2017), and Venous  insufficiency of both lower extremities (11/07/2016). Surgical: [name]  has a past surgical history that includes Cholecystectomy; Knee surgery (Left); deviated septum repair; Cardiac catheterization; Colonoscopy; Nasal septum surgery; Colonoscopy with propofol (N/A, 01/09/2018); and Esophagogastroduodenoscopy (egd) with propofol (N/A, 01/09/2018). Family: family history includes Heart disease in his mother.  Laboratory  Chemistry Profile   Renal Lab Results  Component Value Date   CREATININE 0.90 06/02/2020     Hepatic No results found for: AST, ALT, ALBUMIN, ALKPHOS, HCVAB, AMYLASE, LIPASE, AMMONIA   Electrolytes No results found for: NA, K, CL, CALCIUM, MG, PHOS   Bone Lab Results  Component Value Date   TESTOSTERONE <3 (L) 04/29/2018     Inflammation (CRP: Acute Phase) (ESR: Chronic Phase) No results found for: CRP, ESRSEDRATE, LATICACIDVEN     Note: Above Lab results reviewed.   Physical Exam  General appearance: Well nourished, well developed, and well hydrated. In no apparent acute distress Mental status: Alert, oriented x 3 (person, place, & time)       Respiratory: No evidence of acute respiratory distress Eyes: PERLA Vitals: BP 125/84    Pulse 84    Temp (!) 96.4 F (35.8 C)    Resp 15    Ht '5\' 7"'  (1.702 m)    Wt 220 lb (99.8 kg)    SpO2 95%    BMI 34.46 kg/m  BMI: Estimated body mass index is 34.46 kg/m as calculated from the following:   Height as of this encounter: '5\' 7"'  (1.702 m).   Weight as of this encounter: 220 lb (99.8 kg). Ideal: Ideal body weight: 66.1 kg (145 lb 11.6 oz) Adjusted ideal body weight: 79.6 kg (175 lb 6.9 oz)  Lumbar Spine Area Exam  Skin & Axial Inspection: No masses, redness, or swelling Alignment: Symmetrical Functional ROM: Pain restricted ROM affecting both sides Stability: No instability detected Muscle Tone/Strength: Functionally intact. No obvious neuro-muscular anomalies detected. Sensory (Neurological): Dermatomal pain pattern   Lower Extremity Exam      Side: Right lower extremity   Side: Left lower extremity  Stability: No instability observed           Stability: No instability observed          Skin & Extremity Inspection: Skin color, temperature, and hair growth are WNL. No peripheral edema or cyanosis. No masses, redness, swelling, asymmetry, or associated skin lesions. No contractures.   Skin & Extremity Inspection: Skin color,  temperature, and hair growth are WNL. No peripheral edema or cyanosis. No masses, redness, swelling, asymmetry, or associated skin lesions. No contractures.  Functional ROM: Pain restricted ROM for hip and knee joints           Functional ROM: Pain restricted ROM for hip and knee joints          Muscle Tone/Strength: Functionally intact. No obvious neuro-muscular anomalies detected.   Muscle Tone/Strength: Functionally intact. No obvious neuro-muscular anomalies detected.  Sensory (Neurological): Dermatomal pain pattern + SLR        Sensory (Neurological): Dermatomal pain pattern, +SLR   DTR: Patellar: deferred today Achilles: deferred today Plantar: deferred today   DTR: Patellar: deferred today Achilles: deferred today Plantar: deferred today  Palpation: No palpable anomalies   Palpation: No palpable anomalies    5 out of 5 strength bilateral lower extremity: Plantar flexion, dorsiflexion, knee flexion, knee extension.  Assessment   Status Diagnosis  Persistent Persistent Persistent 1. Chronic radicular lumbar pain   2. Spinal stenosis of lumbar region with neurogenic  claudication   3. Neuroforaminal stenosis of lumbar spine   4. OSA (obstructive sleep apnea)   5. Lumbar spondylosis   6. Chronic pain syndrome       Plan of Care   [name] has a current medication list which includes the following long-term medication(s): albuterol sulfate, bupropion, esomeprazole, ezetimibe, fenofibrate, hydrochlorothiazide, potassium chloride, duloxetine, and gabapentin.  Pharmacotherapy (Medications Ordered): Meds ordered this encounter  Medications   DULoxetine (CYMBALTA) 60 MG capsule    Sig: Take 1 capsule (60 mg total) by mouth daily.    Dispense:  30 capsule    Refill:  11   gabapentin (NEURONTIN) 300 MG capsule    Sig: Take 2 capsules (600 mg total) by mouth at bedtime.    Dispense:  60 capsule    Refill:  11   oxyCODONE-acetaminophen (PERCOCET) 10-325 MG tablet     Sig: Take 1 tablet by mouth every 8 (eight) hours as needed for pain. Must last 30 days.    Dispense:  90 tablet    Refill:  0    Chronic Pain: STOP Act (Not applicable) Fill 1 day early if closed on refill date. Avoid benzodiazepines within 8 hours of opioids   oxyCODONE-acetaminophen (PERCOCET) 10-325 MG tablet    Sig: Take 1 tablet by mouth every 8 (eight) hours as needed for pain. Must last 30 days.    Dispense:  90 tablet    Refill:  0    Chronic Pain: STOP Act (Not applicable) Fill 1 day early if closed on refill date. Avoid benzodiazepines within 8 hours of opioids   oxyCODONE-acetaminophen (PERCOCET) 10-325 MG tablet    Sig: Take 1 tablet by mouth every 8 (eight) hours as needed for pain. Must last 30 days.    Dispense:  90 tablet    Refill:  0    Chronic Pain: STOP Act (Not applicable) Fill 1 day early if closed on refill date. Avoid benzodiazepines within 8 hours of opioids   Orders:  No orders of the defined types were placed in this encounter.  Follow-up plan:   Return in about 3 months (around 07/13/2021) for Medication Management, in person.     s/p b/l SI-J 12/02/2018- not helpful,  lumbar facets L3-S1 b/l; consider sprint peripheral nerve stimulation of lumbar medial branch lumbar facet arthropathy.        Recent Visits Date Type Provider Dept  02/16/21 Office Visit Gillis Santa, MD [facility]  01/19/21 Procedure visit Gillis Santa, MD [facility]  Showing recent visits within past 90 days and meeting all other requirements Today's Visits Date Type Provider Dept  04/14/21 Office Visit Gillis Santa, MD [facility]  Showing today's visits and meeting all other requirements Future Appointments Date Type Provider Dept  07/05/21 Appointment Gillis Santa, MD [facility]  Showing future appointments within next 90 days and meeting all other requirements  I discussed the assessment and treatment plan with the patient. The  patient was provided an opportunity to ask questions and all were answered. The patient agreed with the plan and demonstrated an understanding of the instructions.  Patient advised to call back or seek an in-person evaluation if the symptoms or condition worsens.  Duration of encounter:30 minutes.  Note by: Gillis Santa, MD Date: 04/14/2021; Time: 9:26 AM

## 2021-07-05 ENCOUNTER — Ambulatory Visit
Payer: Medicare HMO | Attending: Student in an Organized Health Care Education/Training Program | Admitting: Student in an Organized Health Care Education/Training Program

## 2021-07-05 ENCOUNTER — Encounter: Payer: Self-pay | Admitting: Student in an Organized Health Care Education/Training Program

## 2021-07-05 ENCOUNTER — Other Ambulatory Visit: Payer: Self-pay

## 2021-07-05 VITALS — BP 136/87 | HR 91 | Temp 97.2°F | Resp 18 | Ht 68.0 in | Wt 215.0 lb

## 2021-07-05 DIAGNOSIS — M48061 Spinal stenosis, lumbar region without neurogenic claudication: Secondary | ICD-10-CM | POA: Insufficient documentation

## 2021-07-05 DIAGNOSIS — M48062 Spinal stenosis, lumbar region with neurogenic claudication: Secondary | ICD-10-CM | POA: Insufficient documentation

## 2021-07-05 DIAGNOSIS — G8929 Other chronic pain: Secondary | ICD-10-CM | POA: Diagnosis present

## 2021-07-05 DIAGNOSIS — G4733 Obstructive sleep apnea (adult) (pediatric): Secondary | ICD-10-CM | POA: Insufficient documentation

## 2021-07-05 DIAGNOSIS — M47816 Spondylosis without myelopathy or radiculopathy, lumbar region: Secondary | ICD-10-CM | POA: Diagnosis present

## 2021-07-05 DIAGNOSIS — G894 Chronic pain syndrome: Secondary | ICD-10-CM | POA: Diagnosis present

## 2021-07-05 DIAGNOSIS — M5416 Radiculopathy, lumbar region: Secondary | ICD-10-CM | POA: Insufficient documentation

## 2021-07-05 MED ORDER — PREGABALIN 75 MG PO CAPS: 75.0000 mg | ORAL_CAPSULE | Freq: Every evening | ORAL | 0 refills | Status: DC | PRN

## 2021-07-05 MED ORDER — OXYCODONE-ACETAMINOPHEN 10-325 MG PO TABS: 1.0000 | ORAL_TABLET | Freq: Three times a day (TID) | ORAL | 0 refills | Status: AC | PRN

## 2021-07-05 NOTE — Progress Notes (Signed)
Nursing Pain Medication Assessment:  ?Safety precautions to be maintained throughout the outpatient stay will include: orient to surroundings, keep bed in low position, maintain call bell within reach at all times, provide assistance with transfer out of bed and ambulation.  ?Medication Inspection Compliance: Pill count conducted under aseptic conditions, in front of the patient. Neither the pills nor the bottle was removed from the patient's sight at any time. Once count was completed pills were immediately returned to the patient in their original bottle. ? ?Medication: Oxycodone/APAP ?Pill/Patch Count:  3 of 90 pills remain ?Pill/Patch Appearance: Markings consistent with prescribed medication ?Bottle Appearance: Standard pharmacy container. Clearly labeled. ?Filled Date: 02 / 27 / 2023 ?Last Medication intake:  Today ?

## 2021-07-05 NOTE — Progress Notes (Signed)
PROVIDER NOTE: Information contained herein reflects review and annotations entered in association with encounter. Interpretation of such information and data should be left to medically-trained personnel. Information provided to patient can be located elsewhere in the medical record under "Patient Instructions". Document created using STT-dictation technology, any transcriptional errors that may result from process are unintentional.  ?  ?Patient: Gary [name]  Service Category: E/M  Provider: Gillis Santa, MD  ?DOB: 15-Oct-1950  DOS: 07/05/2021  Specialty: Interventional Pain Management  ?MRN: [id number]  Setting: Ambulatory outpatient  PCP: Kirk Ruths, MD  ?Type: Established Patient    Referring Provider: Kirk Ruths, MD  ?Location: Office  Delivery: Face-to-face    ? ?HPI  ?Mr. DARIOUS [name], a 71 y.o. year old male, is here today because of his Chronic radicular lumbar pain [M54.16, G89.29]. Mr. Knippenberg primary complain today is Back Pain (low) ? ?Last encounter: My last encounter with him was on 04/14/21 ? ?Pertinent problems: Mr. Orton has Lumbar spondylosis; Lumbar degenerative disc disease; Degenerative disc disease, cervical; Chronic pain syndrome; Facet arthropathy, lumbar; Spinal stenosis of lumbar region with neurogenic claudication; Chronic bilateral low back pain with left-sided sciatica; Chronic left sacroiliac joint pain; Chronic hip pain, left; Cervical spondylosis with radiculopathy; Groin pain, chronic, right; and Chronic left shoulder pain on their pertinent problem list. ?Pain Assessment: Severity of Chronic pain is reported as a 7 /10. Location: Back Lower/radiates down both legs to the mid calf. Onset: More than a month ago. Quality: Stabbing. Timing: Intermittent. Modifying factor(s): meds. ?Vitals:  height is '5\' 8"'  (1.727 m) and weight is 215 lb (97.5 kg). His temperature is 97.2 ?F (36.2 ?C) (abnormal). His blood pressure is 136/87 and his pulse is 91. His  respiration is 18 and oxygen saturation is 96%.  ? ?Reason for encounter: medication management.   ? ?Presents today for medication refill.  He states that the gabapentin may be resulting in weight gain.  We discussed transition to Lyrica.  Otherwise no change in dose of his Percocet. ? ? ? ?Pharmacotherapy Assessment  ?Analgesic:  Percocet 10 mg TID prn, #90/month, MME=45  ? ?Monitoring: ?[place] PMP: PDMP reviewed during this encounter.       ?Pharmacotherapy: No side-effects or adverse reactions reported. ?Compliance: No problems identified. ?Effectiveness: Clinically acceptable.  UDS:  ?Summary  ?Date Value Ref Range Status  ?09/21/2020 Note  Final  ?  Comment:  ?  ==================================================================== ?[employer] Select 13 (MW) ?==================================================================== ?Test                             Result       Flag       Units ? ?Drug Present and Declared for Prescription Verification ?  Oxycodone                      1267         EXPECTED   ng/mg creat ?  Oxymorphone                    493          EXPECTED   ng/mg creat ?  Noroxycodone                   1407         EXPECTED   ng/mg creat ?  Noroxymorphone  213          EXPECTED   ng/mg creat ?   Sources of oxycodone are scheduled prescription medications. ?   Oxymorphone, noroxycodone, and noroxymorphone are expected ?   metabolites of oxycodone. Oxymorphone is also available as a ?   scheduled prescription medication. ? ?==================================================================== ?Test                      Result    Flag   Units      Ref Range ?  Creatinine              101              mg/dL      >=20 ?==================================================================== ?Declared Medications: ? The flagging and interpretation on this report are based on the ? following declared medications.  Unexpected results may arise from ? inaccuracies in the declared medications. ? ?  **Note: The testing scope of this panel includes these medications: ? ? Oxycodone (Percocet) ? ? **Note: The testing scope of this panel does not include the ? following reported medications: ? ? Acetaminophen (Tylenol) ? Acetaminophen (Percocet) ? Albuterol (Proair HFA) ? Bempedoic Acid (Nexletol) ? Betamethasone (Lotrisone) ? Bupropion (Wellbutrin) ? Buspirone (Buspar) ? Clobetasol ? Clotrimazole (Lotrisone) ? Diclofenac (Voltaren) ? Duloxetine (Cymbalta) ? Esomeprazole (Nexium) ? Ezetimibe (Zetia) ? Fenofibrate (TriCor) ? Finasteride (Proscar) ? Gabapentin (Neurontin) ? Hydrochlorothiazide (Hydrodiuril) ? Multivitamin ? Mupirocin (Bactroban) ? Potassium (Klor-Con) ? Sildenafil ? Tamsulosin (Flomax) ? Vitamin D ?==================================================================== ?For clinical consultation, please call [phone]. ?==================================================================== ?  ?  ? ? ?ROS  ?Constitutional:  low back pain, Bilateral leg, groin pain worse with exertion ?Gastrointestinal: No reported hemesis, hematochezia, vomiting, or acute GI distress ?Musculoskeletal: Denies any acute onset joint swelling, redness, loss of ROM, or weakness ?Neurological:  LE radicular pain ? ?Medication Review  ?Albuterol Sulfate, Bempedoic Acid, DULoxetine, Magnesium, acetaminophen, buPROPion, busPIRone, cholecalciferol, clobetasol ointment, clotrimazole-betamethasone, diclofenac sodium, esomeprazole, ezetimibe, fenofibrate, finasteride, hydrochlorothiazide, multivitamin, oxyCODONE-acetaminophen, potassium chloride, pregabalin, sildenafil, and tamsulosin ? ?History Review  ?Allergy: Mr. Alpert is allergic to niacin-lovastatin er, atorvastatin, other, simvastatin, and propoxyphene. ?Drug: Mr. Fester  reports no history of drug use. ?Alcohol:  reports no history of alcohol use. ?Tobacco:  reports that he quit smoking about 9 years ago. His smoking use included cigarettes. He has a 67.50 pack-year  smoking history. He has never used smokeless tobacco. ?Social: Mr. Blagg  reports that he quit smoking about 9 years ago. His smoking use included cigarettes. He has a 67.50 pack-year smoking history. He has never used smokeless tobacco. He reports that he does not drink alcohol and does not use drugs. ?Medical:  has a past medical history of Anxiety, Asthma, Benign essential HTN (06/24/2015), BPH (benign prostatic hyperplasia), COPD (chronic obstructive pulmonary disease) ([place]), Coronary artery disease (02/16/2014), DDD (degenerative disc disease), cervical, Degenerative disc disease, cervical (01/22/2017), Depression, DJD (degenerative joint disease), Emphysema of lung ([place]), Heel pain (10/10/2017), Hyperglobulinemia, Lumbar degenerative disc disease (01/22/2017), Major depression in remission ([place]) (03/10/2014), Migraines, Mitral regurgitation, Mixed hyperlipidemia (02/16/2014), Neck pain (01/24/2017), OSA (obstructive sleep apnea) (07/05/2016), Osteoarthritis of knee (08/12/2014), Prostate cancer ([place]) (2019), RA (rheumatoid arthritis) ([place]), Spinal stenosis of lumbar region with neurogenic claudication (04/18/2016), Spondylosis without myelopathy or radiculopathy, lumbar region (01/22/2017), and Venous insufficiency of both lower extremities (11/07/2016). ?Surgical: Mr. Sawaya  has a past surgical history that includes Cholecystectomy; Knee surgery (Left); deviated septum repair; Cardiac catheterization; Colonoscopy; Nasal septum surgery; Colonoscopy with propofol (  N/A, 01/09/2018); and Esophagogastroduodenoscopy (egd) with propofol (N/A, 01/09/2018). ?Family: family history includes Heart disease in his mother. ? ?Laboratory Chemistry Profile  ? ?Renal ?Lab Results  ?Component Value Date  ? CREATININE 0.90 06/02/2020  ? ?  Hepatic ?No results found for: AST, ALT, ALBUMIN, ALKPHOS, HCVAB, AMYLASE, LIPASE, AMMONIA ?  ?Electrolytes ?No results found for: NA, K, CL, CALCIUM, MG, PHOS ?  Bone ?Lab Results  ?Component Value  Date  ? TESTOSTERONE <3 (L) 04/29/2018  ? ?  ?Inflammation (CRP: Acute Phase) (ESR: Chronic Phase) ?No results found for: CRP, ESRSEDRATE, LATICACIDVEN ?    ?Note: Above Lab results reviewed. ? ? ?Physical Exam  ?Ge

## 2021-07-18 ENCOUNTER — Inpatient Hospital Stay: Payer: Medicare HMO | Attending: Radiation Oncology

## 2021-07-18 DIAGNOSIS — C61 Malignant neoplasm of prostate: Secondary | ICD-10-CM | POA: Diagnosis present

## 2021-07-18 LAB — PSA: Prostatic Specific Antigen: <0.01 ng/mL (ref 0.00–4.00)

## 2021-07-21 ENCOUNTER — Ambulatory Visit
Admission: RE | Admit: 2021-07-21 | Discharge: 2021-07-21 | Disposition: A | Discharge status: Home / Self Care | Payer: Medicare HMO | Source: Ambulatory Visit | Attending: Radiation Oncology | Admitting: Radiation Oncology

## 2021-07-21 ENCOUNTER — Encounter: Payer: Self-pay | Admitting: Radiation Oncology

## 2021-07-21 VITALS — BP 144/94 | HR 89 | Temp 96.2°F | Resp 16 | Wt 217.5 lb

## 2021-07-21 DIAGNOSIS — Z923 Personal history of irradiation: Secondary | ICD-10-CM | POA: Insufficient documentation

## 2021-07-21 DIAGNOSIS — C61 Malignant neoplasm of prostate: Secondary | ICD-10-CM | POA: Insufficient documentation

## 2021-07-21 NOTE — Progress Notes (Signed)
Radiation Oncology ?Follow up Note ? ?Name: Gary [name]   ?Date:   07/21/2021 ?MRN:  [id number] ?DOB: May 25, 1950  ? ? ?This 71 y.o. male presents to the clinic today for 3-year follow-up status post IMRT radiation therapy for stage IIb Gleason 7 (4+3) adenocarcinoma of the prostate. ? ?REFERRING PROVIDER: Kirk Ruths, MD ? ?HPI: Patient is a 71 year old male now out 3 years having completed IMRT radiation therapy for Gleason 7 (4+3) adenocarcinoma the prostate.  Seen today in routine follow-up he is doing well specifically denies any increased lower urinary tract symptoms diarrhea or fatigue.  His most recent PSA is less than 0.01.. ? ?COMPLICATIONS OF TREATMENT: none ? ?FOLLOW UP COMPLIANCE: keeps appointments  ? ?PHYSICAL EXAM:  ?BP (!) 144/94 (BP Location: Left Arm, Patient Position: Sitting)   Pulse 89   Temp (!) 96.2 ?F (35.7 ?C)   Resp 16   Wt 217 lb 8 oz (98.7 kg)   BMI 33.07 kg/m?  ?Well-developed well-nourished patient in NAD. HEENT reveals PERLA, EOMI, discs not visualized.  Oral cavity is clear. No oral mucosal lesions are identified. Neck is clear without evidence of cervical or supraclavicular adenopathy. Lungs are clear to A&P. Cardiac examination is essentially unremarkable with regular rate and rhythm without murmur rub or thrill. Abdomen is benign with no organomegaly or masses noted. Motor sensory and DTR levels are equal and symmetric in the upper and lower extremities. Cranial nerves II through XII are grossly intact. Proprioception is intact. No peripheral adenopathy or edema is identified. No motor or sensory levels are noted. Crude visual fields are within normal range. ? ?RADIOLOGY RESULTS: No current films for review ? ?PLAN: Present time patient is continues to be under excellent biochemical control of his prostate cancer is having some problems with hot flashes I have suggested some vitamin E supplements.  We will see him back in 1 year with a repeat PSA at that time.   Patient knows to call with any concerns. ? ?I would like to take this opportunity to thank you for allowing me to participate in the care of your patient.. ?  ? Noreene Filbert, MD ? ?

## 2021-08-21 ENCOUNTER — Other Ambulatory Visit: Payer: Self-pay | Admitting: Radiation Oncology

## 2021-09-19 ENCOUNTER — Telehealth: Payer: Self-pay | Admitting: Acute Care

## 2021-09-19 NOTE — Telephone Encounter (Signed)
Attempted to reach pt to schedule annual LDCT-LVMM 

## 2021-09-20 ENCOUNTER — Other Ambulatory Visit: Payer: Self-pay

## 2021-09-20 DIAGNOSIS — Z122 Encounter for screening for malignant neoplasm of respiratory organs: Secondary | ICD-10-CM

## 2021-09-20 DIAGNOSIS — Z87891 Personal history of nicotine dependence: Secondary | ICD-10-CM

## 2021-09-27 ENCOUNTER — Encounter: Payer: Medicare HMO | Admitting: Student in an Organized Health Care Education/Training Program

## 2021-09-29 ENCOUNTER — Ambulatory Visit
Admission: RE | Admit: 2021-09-29 | Discharge: 2021-09-29 | Disposition: A | Discharge status: Home / Self Care | Payer: Medicare HMO | Source: Ambulatory Visit | Attending: Internal Medicine | Admitting: Internal Medicine

## 2021-09-29 DIAGNOSIS — J439 Emphysema, unspecified: Secondary | ICD-10-CM | POA: Insufficient documentation

## 2021-09-29 DIAGNOSIS — I2510 Atherosclerotic heart disease of native coronary artery without angina pectoris: Secondary | ICD-10-CM | POA: Diagnosis not present

## 2021-09-29 DIAGNOSIS — Z87891 Personal history of nicotine dependence: Secondary | ICD-10-CM | POA: Diagnosis not present

## 2021-09-29 DIAGNOSIS — Z122 Encounter for screening for malignant neoplasm of respiratory organs: Secondary | ICD-10-CM

## 2021-09-29 DIAGNOSIS — I700 Atherosclerosis of aorta: Secondary | ICD-10-CM | POA: Diagnosis not present

## 2021-09-30 ENCOUNTER — Other Ambulatory Visit: Payer: Self-pay | Admitting: Acute Care

## 2021-09-30 DIAGNOSIS — Z122 Encounter for screening for malignant neoplasm of respiratory organs: Secondary | ICD-10-CM

## 2021-09-30 DIAGNOSIS — Z87891 Personal history of nicotine dependence: Secondary | ICD-10-CM

## 2021-10-06 ENCOUNTER — Ambulatory Visit
Payer: Medicare HMO | Attending: Student in an Organized Health Care Education/Training Program | Admitting: Student in an Organized Health Care Education/Training Program

## 2021-10-06 ENCOUNTER — Encounter: Payer: Self-pay | Admitting: Student in an Organized Health Care Education/Training Program

## 2021-10-06 DIAGNOSIS — G894 Chronic pain syndrome: Secondary | ICD-10-CM | POA: Insufficient documentation

## 2021-10-06 DIAGNOSIS — M79641 Pain in right hand: Secondary | ICD-10-CM | POA: Diagnosis present

## 2021-10-06 DIAGNOSIS — G8929 Other chronic pain: Secondary | ICD-10-CM | POA: Insufficient documentation

## 2021-10-06 DIAGNOSIS — M5416 Radiculopathy, lumbar region: Secondary | ICD-10-CM | POA: Insufficient documentation

## 2021-10-06 DIAGNOSIS — M48061 Spinal stenosis, lumbar region without neurogenic claudication: Secondary | ICD-10-CM | POA: Diagnosis present

## 2021-10-06 DIAGNOSIS — M48062 Spinal stenosis, lumbar region with neurogenic claudication: Secondary | ICD-10-CM | POA: Diagnosis present

## 2021-10-06 DIAGNOSIS — M47816 Spondylosis without myelopathy or radiculopathy, lumbar region: Secondary | ICD-10-CM | POA: Insufficient documentation

## 2021-10-06 MED ORDER — PREGABALIN 75 MG PO CAPS: 75.0000 mg | ORAL_CAPSULE | Freq: Every evening | ORAL | 2 refills | Status: DC | PRN

## 2021-10-06 MED ORDER — OXYCODONE-ACETAMINOPHEN 10-325 MG PO TABS: 1.0000 | ORAL_TABLET | Freq: Three times a day (TID) | ORAL | 0 refills | Status: DC | PRN

## 2021-10-06 NOTE — Progress Notes (Signed)
Nursing Pain Medication Assessment:  Safety precautions to be maintained throughout the outpatient stay will include: orient to surroundings, keep bed in low position, maintain call bell within reach at all times, provide assistance with transfer out of bed and ambulation.  Medication Inspection Compliance: Pill count conducted under aseptic conditions, in front of the patient. Neither the pills nor the bottle was removed from the patient's sight at any time. Once count was completed pills were immediately returned to the patient in their original bottle.  Medication: Oxycodone/APAP Pill/Patch Count:  0 of 90 pills remain Pill/Patch Appearance: Markings consistent with prescribed medication Bottle Appearance: Standard pharmacy container. Clearly labeled. Filled Date: 05 / 27 / 2023 Last Medication intake:  Today

## 2021-10-06 NOTE — Progress Notes (Signed)
PROVIDER NOTE: Information contained herein reflects review and annotations entered in association with encounter. Interpretation of such information and data should be left to medically-trained personnel. Information provided to patient can be located elsewhere in the medical record under "Patient Instructions". Document created using STT-dictation technology, any transcriptional errors that may result from process are unintentional.    Patient: Gary [name]  Service Category: E/M  Provider: Gillis Santa, MD  DOB: [birth year]  DOS: 10/06/2021  Specialty: Interventional Pain Management  MRN: [id number]  Setting: Ambulatory outpatient  PCP: Kirk Ruths, MD  Type: Established Patient    Referring Provider: Kirk Ruths, MD  Location: Office  Delivery: Face-to-face     HPI  [name], a 71 y.o. year old male, is here today because of his No primary diagnosis found.. [name] primary complain today is Back Pain  Last encounter: My last encounter with him was on 07/05/21  Pertinent problems: [name] has Lumbar spondylosis; Lumbar degenerative disc disease; Degenerative disc disease, cervical; Chronic pain syndrome; Facet arthropathy, lumbar; Spinal stenosis of lumbar region with neurogenic claudication; Chronic bilateral low back pain with left-sided sciatica; Chronic left sacroiliac joint pain; Chronic hip pain, left; Cervical spondylosis with radiculopathy; Groin pain, chronic, right; and Chronic left shoulder pain on their pertinent problem list. Pain Assessment: Severity of Chronic pain is reported as a 6 /10. Location: Back Lower/radiates into both legs to mid calves. Onset: More than a month ago. Quality: Cramping, Aching, Stabbing. Timing: Constant. Modifying factor(s): nothing. Vitals:  height is _0  (1.727 m) and weight is 219 lb (99.3 kg). His temperature is 98 F (36.7 C). His blood pressure is 131/80 and his pulse is 97.   Reason for encounter:  medication management.    Presents today for medication refill.  Is complaining of right hand pain that may be concerning for gout.  No specific dietary triggers that cause the swelling or increase in his pain.  Will refer to rheumatology for right hand gout evaluation and work-up. Otherwise we will refill his oxycodone, Lyrica and renew our annual urine toxicology screen for medication compliance and monitoring.    Pharmacotherapy Assessment  Analgesic:  Percocet 10 mg TID prn, #90/month, MME=45   Monitoring: [place] PMP: PDMP reviewed during this encounter.       Pharmacotherapy: No side-effects or adverse reactions reported. Compliance: No problems identified. Effectiveness: Clinically acceptable.  UDS:  Summary  Date Value Ref Range Status  09/21/2020 Note  Final    Comment:    ==================================================================== [employer] Select 13 (MW) ==================================================================== Test                             Result       Flag       Units  Drug Present and Declared for Prescription Verification   Oxycodone                      1267         EXPECTED   ng/mg creat   Oxymorphone                    493          EXPECTED   ng/mg creat   Noroxycodone                   1407         EXPECTED  ng/mg creat   Noroxymorphone                 213          EXPECTED   ng/mg creat    Sources of oxycodone are scheduled prescription medications.    Oxymorphone, noroxycodone, and noroxymorphone are expected    metabolites of oxycodone. Oxymorphone is also available as a    scheduled prescription medication.  ==================================================================== Test                      Result    Flag   Units      Ref Range   Creatinine              101              mg/dL      >=20 ==================================================================== Declared Medications:  The flagging and interpretation on this report  are based on the  following declared medications.  Unexpected results may arise from  inaccuracies in the declared medications.   **Note: The testing scope of this panel includes these medications:   Oxycodone (Percocet)   **Note: The testing scope of this panel does not include the  following reported medications:   Acetaminophen (Tylenol)  Acetaminophen (Percocet)  Albuterol (Proair HFA)  Bempedoic Acid (Nexletol)  Betamethasone (Lotrisone)  Bupropion (Wellbutrin)  Buspirone (Buspar)  Clobetasol  Clotrimazole (Lotrisone)  Diclofenac (Voltaren)  Duloxetine (Cymbalta)  Esomeprazole (Nexium)  Ezetimibe (Zetia)  Fenofibrate (TriCor)  Finasteride (Proscar)  Gabapentin (Neurontin)  Hydrochlorothiazide (Hydrodiuril)  Multivitamin  Mupirocin (Bactroban)  Potassium (Klor-Con)  Sildenafil  Tamsulosin (Flomax)  Vitamin D ==================================================================== For clinical consultation, please call [phone]. ====================================================================       ROS  Constitutional:  low back pain, Bilateral leg, groin pain worse with exertion Gastrointestinal: No reported hemesis, hematochezia, vomiting, or acute GI distress Musculoskeletal:  Right hand pain, swelling Neurological:  LE radicular pain  Medication Review  Albuterol Sulfate, Bempedoic Acid, DULoxetine, Magnesium, acetaminophen, buPROPion, busPIRone, cholecalciferol, clobetasol ointment, clotrimazole-betamethasone, diclofenac sodium, esomeprazole, ezetimibe, fenofibrate, finasteride, fluticasone-salmeterol, hydrochlorothiazide, multivitamin, oxyCODONE-acetaminophen, potassium chloride, pregabalin, sildenafil, and tamsulosin  History Review  Allergy: [name] is allergic to niacin-lovastatin er, atorvastatin, other, simvastatin, and propoxyphene. Drug: [name]  reports no history of drug use. Alcohol:  reports no history of alcohol use. Tobacco:   reports that he quit smoking about 9 years ago. His smoking use included cigarettes. He has a 67.50 pack-year smoking history. He has never used smokeless tobacco. Social: Mr. Read  reports that he quit smoking about 9 years ago. His smoking use included cigarettes. He has a 67.50 pack-year smoking history. He has never used smokeless tobacco. He reports that he does not drink alcohol and does not use drugs. Medical:  has a past medical history of Anxiety, Asthma, Benign essential HTN (06/24/2015), BPH (benign prostatic hyperplasia), COPD (chronic obstructive pulmonary disease) ([place]), Coronary artery disease (02/16/2014), DDD (degenerative disc disease), cervical, Degenerative disc disease, cervical (01/22/2017), Depression, DJD (degenerative joint disease), Emphysema of lung ([place]), Heel pain (10/10/2017), Hyperglobulinemia, Lumbar degenerative disc disease (01/22/2017), Major depression in remission ([place]) (03/10/2014), Migraines, Mitral regurgitation, Mixed hyperlipidemia (02/16/2014), Neck pain (01/24/2017), OSA (obstructive sleep apnea) (07/05/2016), Osteoarthritis of knee (08/12/2014), Prostate cancer ([place]) (2019), RA (rheumatoid arthritis) ([place]), Spinal stenosis of lumbar region with neurogenic claudication (04/18/2016), Spondylosis without myelopathy or radiculopathy, lumbar region (01/22/2017), and Venous insufficiency of both lower extremities (11/07/2016). Surgical: Mr. Lemelle  has a past surgical history that includes Cholecystectomy;  Knee surgery (Left); deviated septum repair; Cardiac catheterization; Colonoscopy; Nasal septum surgery; Colonoscopy with propofol (N/A, 01/09/2018); and Esophagogastroduodenoscopy (egd) with propofol (N/A, 01/09/2018). Family: family history includes Heart disease in his mother.  Laboratory Chemistry Profile   Renal Lab Results  Component Value Date   CREATININE 0.90 06/02/2020     Hepatic No results found for: "AST", "ALT", "ALBUMIN", "ALKPHOS", "HCVAB", "AMYLASE",  "LIPASE", "AMMONIA"   Electrolytes No results found for: "NA", "K", "CL", "CALCIUM", "MG", "PHOS"   Bone Lab Results  Component Value Date   TESTOSTERONE <3 (L) 04/29/2018     Inflammation (CRP: Acute Phase) (ESR: Chronic Phase) No results found for: "CRP", "ESRSEDRATE", "LATICACIDVEN"     Note: Above Lab results reviewed.   Physical Exam  General appearance: Well nourished, well developed, and well hydrated. In no apparent acute distress Mental status: Alert, oriented x 3 (person, place, & time)       Respiratory: No evidence of acute respiratory distress Eyes: PERLA Vitals: BP 131/80 (BP Location: Right Arm, Patient Position: Sitting, Cuff Size: Normal)   Pulse 97   Temp 98 F (36.7 C)   Ht _0  (1.727 m)   Wt 219 lb (99.3 kg)   BMI 33.30 kg/m  BMI: Estimated body mass index is 33.3 kg/m as calculated from the following:   Height as of this encounter: _1  (1.727 m).   Weight as of this encounter: 219 lb (99.3 kg). Ideal: Ideal body weight: 68.4 kg (150 lb 12.7 oz) Adjusted ideal body weight: 80.8 kg (178 lb 1.2 oz)  Right hand pain, swelling  Lumbar Spine Area Exam  Skin & Axial Inspection: No masses, redness, or swelling Alignment: Symmetrical Functional ROM: Pain restricted ROM affecting both sides Stability: No instability detected Muscle Tone/Strength: Functionally intact. No obvious neuro-muscular anomalies detected. Sensory (Neurological): Dermatomal pain pattern   Lower Extremity Exam      Side: Right lower extremity   Side: Left lower extremity  Stability: No instability observed           Stability: No instability observed          Skin & Extremity Inspection: Skin color, temperature, and hair growth are WNL. No peripheral edema or cyanosis. No masses, redness, swelling, asymmetry, or associated skin lesions. No contractures.   Skin & Extremity Inspection: Skin color, temperature, and hair growth are WNL. No peripheral edema or cyanosis. No masses,  redness, swelling, asymmetry, or associated skin lesions. No contractures.  Functional ROM: Pain restricted ROM for hip and knee joints           Functional ROM: Pain restricted ROM for hip and knee joints          Muscle Tone/Strength: Functionally intact. No obvious neuro-muscular anomalies detected.   Muscle Tone/Strength: Functionally intact. No obvious neuro-muscular anomalies detected.  Sensory (Neurological): Dermatomal pain pattern + SLR        Sensory (Neurological): Dermatomal pain pattern, +SLR   DTR: Patellar: deferred today Achilles: deferred today Plantar: deferred today   DTR: Patellar: deferred today Achilles: deferred today Plantar: deferred today  Palpation: No palpable anomalies   Palpation: No palpable anomalies    5 out of 5 strength bilateral lower extremity: Plantar flexion, dorsiflexion, knee flexion, knee extension.  Assessment   Status Diagnosis  Persistent Persistent Persistent 1. Chronic radicular lumbar pain   2. Spinal stenosis of lumbar region with neurogenic claudication   3. Neuroforaminal stenosis of lumbar spine   4. Lumbar spondylosis   5. Chronic pain  syndrome   6. Right hand pain       Plan of Care   Mr. CARNEL STEGMAN has a current medication list which includes the following long-term medication(s): albuterol sulfate, bupropion, duloxetine, esomeprazole, ezetimibe, fenofibrate, fluticasone-salmeterol, hydrochlorothiazide, potassium chloride, and pregabalin.  Pharmacotherapy (Medications Ordered): Meds ordered this encounter  Medications   oxyCODONE-acetaminophen (PERCOCET) 10-325 MG tablet    Sig: Take 1 tablet by mouth every 8 (eight) hours as needed for pain. Must last 30 days.    Dispense:  90 tablet    Refill:  0    Chronic Pain: STOP Act (Not applicable) Fill 1 day early if closed on refill date. Avoid benzodiazepines within 8 hours of opioids   oxyCODONE-acetaminophen (PERCOCET) 10-325 MG tablet    Sig: Take 1 tablet by  mouth every 8 (eight) hours as needed for pain. Must last 30 days.    Dispense:  90 tablet    Refill:  0    Chronic Pain: STOP Act (Not applicable) Fill 1 day early if closed on refill date. Avoid benzodiazepines within 8 hours of opioids   oxyCODONE-acetaminophen (PERCOCET) 10-325 MG tablet    Sig: Take 1 tablet by mouth every 8 (eight) hours as needed for pain. Must last 30 days.    Dispense:  90 tablet    Refill:  0    Chronic Pain: STOP Act (Not applicable) Fill 1 day early if closed on refill date. Avoid benzodiazepines within 8 hours of opioids   pregabalin (LYRICA) 75 MG capsule    Sig: Take 1 capsule (75 mg total) by mouth at bedtime as needed.    Dispense:  90 capsule    Refill:  2    Fill one day early if pharmacy is closed on scheduled refill date. May substitute for generic if available.   Orders:  Orders Placed This Encounter  Procedures   Tox[employer] Select 13 (MW), Urine    Volume: 30 ml(s). Minimum 3 ml of urine is needed. Document temperature of fresh sample. Indications: Long term (current) use of opiate analgesic (Z79.891)    Order Specific Question:   Release to patient    Answer:   Immediate   Ambulatory referral to Rheumatology    Referral Priority:   Routine    Referral Type:   Consultation    Referral Reason:   Specialty Services Required    Referred to Provider:   Quintin Alto, MD    Requested Specialty:   Rheumatology    Number of Visits Requested:   1   Follow-up plan:   Return in about 3 months (around 01/06/2022) for Medication Management, in person.     s/p b/l SI-J 12/02/2018- not helpful,  lumbar facets L3-S1 b/l; consider sprint peripheral nerve stimulation of lumbar medial branch lumbar facet arthropathy.        Recent Visits No visits were found meeting these conditions. Showing recent visits within past 90 days and meeting all other requirements Today's Visits Date Type Provider Dept  10/06/21 Office Visit Gillis Santa, MD [facility]  Showing today's visits and meeting all other requirements Future Appointments No visits were found meeting these conditions. Showing future appointments within next 90 days and meeting all other requirements  I discussed the assessment and treatment plan with the patient. The patient was provided an opportunity to ask questions and all were answered. The patient agreed with the plan and demonstrated an understanding of the instructions.  Patient advised to call back or  seek an in-person evaluation if the symptoms or condition worsens.  Duration of encounter:30 minutes.  Note by: Gillis Santa, MD Date: 10/06/2021; Time: 1:56 PM

## 2021-10-10 ENCOUNTER — Telehealth: Payer: Self-pay | Admitting: Student in an Organized Health Care Education/Training Program

## 2021-10-10 ENCOUNTER — Other Ambulatory Visit: Payer: Self-pay | Admitting: *Deleted

## 2021-10-10 DIAGNOSIS — G8929 Other chronic pain: Secondary | ICD-10-CM

## 2021-10-10 DIAGNOSIS — M48061 Spinal stenosis, lumbar region without neurogenic claudication: Secondary | ICD-10-CM

## 2021-10-10 DIAGNOSIS — G894 Chronic pain syndrome: Secondary | ICD-10-CM

## 2021-10-10 DIAGNOSIS — M47816 Spondylosis without myelopathy or radiculopathy, lumbar region: Secondary | ICD-10-CM

## 2021-10-10 DIAGNOSIS — M48062 Spinal stenosis, lumbar region with neurogenic claudication: Secondary | ICD-10-CM

## 2021-10-10 LAB — TOXASSURE SELECT 13 (MW), URINE

## 2021-10-10 MED ORDER — OXYCODONE-ACETAMINOPHEN 10-325 MG PO TABS: 1.0000 | ORAL_TABLET | Freq: Three times a day (TID) | ORAL | 0 refills | Status: AC | PRN

## 2021-10-10 NOTE — Telephone Encounter (Signed)
Message sent to [facility] for rx request.  Rx has been sent.

## 2021-10-10 NOTE — Telephone Encounter (Signed)
Patient called this am, stating Dr. Holley Raring put in 2 scripts to be filled 11-05-21 instead of one for June and one for July, percocet. Please assist patient and call him.

## 2021-10-10 NOTE — Telephone Encounter (Signed)
FN did send, however the date was not changed and still reflected a date of 11/05/21.  Rx request resent with request to change the date.

## 2021-10-14 NOTE — Telephone Encounter (Signed)
Gary [name], can you check on this please. I tried to see in his chart if a corrected script was sent in for this patient but I cannot tell.

## 2021-10-18 ENCOUNTER — Telehealth: Payer: Self-pay | Admitting: Student in an Organized Health Care Education/Training Program

## 2021-10-18 NOTE — Telephone Encounter (Signed)
Letter constructed and Dr Holley Raring has signed.   Voicemail left with patient that letter is ready and he may pick-up with Juliann Pulse at his convenience.

## 2021-10-18 NOTE — Telephone Encounter (Signed)
Patient wanted to know if doctor can write him up an paper about his back condition. Patient stated this paper work will assist him in getting an new matttess. His insurance is requesting this paper work so it will be paid for. Please give patient a call. Thanks

## 2022-01-02 ENCOUNTER — Encounter: Payer: Self-pay | Admitting: Student in an Organized Health Care Education/Training Program

## 2022-01-02 ENCOUNTER — Ambulatory Visit
Payer: Medicare HMO | Attending: Student in an Organized Health Care Education/Training Program | Admitting: Student in an Organized Health Care Education/Training Program

## 2022-01-02 VITALS — BP 132/78 | HR 93 | Temp 97.2°F | Resp 16 | Ht 67.0 in | Wt 214.0 lb

## 2022-01-02 DIAGNOSIS — M48061 Spinal stenosis, lumbar region without neurogenic claudication: Secondary | ICD-10-CM | POA: Diagnosis present

## 2022-01-02 DIAGNOSIS — M47816 Spondylosis without myelopathy or radiculopathy, lumbar region: Secondary | ICD-10-CM | POA: Insufficient documentation

## 2022-01-02 DIAGNOSIS — G4733 Obstructive sleep apnea (adult) (pediatric): Secondary | ICD-10-CM | POA: Diagnosis present

## 2022-01-02 DIAGNOSIS — G894 Chronic pain syndrome: Secondary | ICD-10-CM | POA: Insufficient documentation

## 2022-01-02 DIAGNOSIS — G8929 Other chronic pain: Secondary | ICD-10-CM | POA: Insufficient documentation

## 2022-01-02 DIAGNOSIS — M5416 Radiculopathy, lumbar region: Secondary | ICD-10-CM | POA: Diagnosis not present

## 2022-01-02 DIAGNOSIS — M48062 Spinal stenosis, lumbar region with neurogenic claudication: Secondary | ICD-10-CM | POA: Diagnosis present

## 2022-01-02 MED ORDER — OXYCODONE-ACETAMINOPHEN 10-325 MG PO TABS: 1.0000 | ORAL_TABLET | Freq: Three times a day (TID) | ORAL | 0 refills | Status: AC | PRN

## 2022-01-02 MED ORDER — PREGABALIN 75 MG PO CAPS: 75.0000 mg | ORAL_CAPSULE | Freq: Every evening | ORAL | 2 refills | Status: DC | PRN

## 2022-01-02 NOTE — Progress Notes (Signed)
PROVIDER NOTE: Information contained herein reflects review and annotations entered in association with encounter. Interpretation of such information and data should be left to medically-trained personnel. Information provided to patient can be located elsewhere in the medical record under "Patient Instructions". Document created using STT-dictation technology, any transcriptional errors that may result from process are unintentional.    Patient: Gary [name]  Service Category: E/M  Provider: Gillis Santa, MD  DOB: 1950-12-03  DOS: 01/02/2022  Specialty: Interventional Pain Management  MRN: [id number]  Setting: Ambulatory outpatient  PCP: Kirk Ruths, MD  Type: Established Patient    Referring Provider: Kirk Ruths, MD  Location: Office  Delivery: Face-to-face     HPI  [name], a 71 y.o. year old male, is here today because of his Chronic radicular lumbar pain [M54.16, G89.29]. Mr. Seeley primary complain today is Back Pain  Last encounter: My last encounter with him was on 10/06/21  Pertinent problems: Mr. Norville has Lumbar spondylosis; Lumbar degenerative disc disease; Degenerative disc disease, cervical; Chronic pain syndrome; Facet arthropathy, lumbar; Spinal stenosis of lumbar region with neurogenic claudication; Chronic bilateral low back pain with left-sided sciatica; Chronic left sacroiliac joint pain; Chronic hip pain, left; Cervical spondylosis with radiculopathy; Groin pain, chronic, right; and Chronic left shoulder pain on their pertinent problem list. Pain Assessment: Severity of Chronic pain is reported as a 7 /10. Location: Back Right, Left, Lower/hips bilateral,radiates down front of legs to calves. Onset: More than a month ago. Quality: Aching, Constant, Sharp, Stabbing. Timing: Constant. Modifying factor(s): "Nothing". Vitals:  height is _0  (1.702 m) and weight is 214 lb (97.1 kg). His temperature is 97.2 F (36.2 C) (abnormal). His blood  pressure is 132/78 and his pulse is 93. His respiration is 16 and oxygen saturation is 97%.   Reason for encounter: medication management.    No change in medical history since last visit.  Patient's pain is at baseline.  Patient continues multimodal pain regimen as prescribed.  States that it provides pain relief and improvement in functional status.  Pharmacotherapy Assessment  Analgesic:  Percocet 10 mg TID prn, #90/month, MME=45   Monitoring: [place] PMP: PDMP reviewed during this encounter.       Pharmacotherapy: No side-effects or adverse reactions reported. Compliance: No problems identified. Effectiveness: Clinically acceptable.  UDS:  Summary  Date Value Ref Range Status  10/06/2021 Note  Final    Comment:    ==================================================================== [facility] (MW) ==================================================================== Test                             Result       Flag       Units  Drug Present and Declared for Prescription Verification   Oxycodone                      1838         EXPECTED   ng/mg creat   Oxymorphone                    749          EXPECTED   ng/mg creat   Noroxycodone                   1370         EXPECTED   ng/mg creat   Noroxymorphone  317          EXPECTED   ng/mg creat    Sources of oxycodone are scheduled prescription medications.    Oxymorphone, noroxycodone, and noroxymorphone are expected    metabolites of oxycodone. Oxymorphone is also available as a    scheduled prescription medication.  ==================================================================== Test                      Result    Flag   Units      Ref Range   Creatinine              146              mg/dL      >=20 ==================================================================== Declared Medications:  The flagging and interpretation on this report are based on the  following declared medications.  Unexpected  results may arise from  inaccuracies in the declared medications.   **Note: The testing scope of this panel includes these medications:   Oxycodone (Percocet)   **Note: The testing scope of this panel does not include the  following reported medications:   Acetaminophen (Tylenol)  Acetaminophen (Percocet)  Albuterol (Proair HFA)  Bempedoic Acid (Nexletol)  Betamethasone (Lotrisone)  Bupropion (Wellbutrin XL)  Buspirone (Buspar)  Clobetasol (Temovate)  Clotrimazole (Lotrisone)  Duloxetine (Cymbalta)  Esomeprazole (Nexium)  Ezetimibe (Zetia)  Fenofibrate (TriCor)  Finasteride (Proscar)  Fluticasone (Advair)  Hydrochlorothiazide (Hydrodiuril)  Magnesium  Multivitamin  Potassium (Klor-Con)  Pregabalin (Lyrica)  Salmeterol (Advair)  Sildenafil  Tamsulosin (Flomax)  Topical Diclofenac (Voltaren)  Vitamin D ==================================================================== For clinical consultation, please call [phone]. ====================================================================       ROS  Constitutional:  low back pain Gastrointestinal: No reported hemesis, hematochezia, vomiting, or acute GI distress Musculoskeletal:  Right hand pain, swelling Neurological:  LE radicular pain  Medication Review  Albuterol Sulfate, Bempedoic Acid, DULoxetine, Magnesium, acetaminophen, buPROPion, busPIRone, cholecalciferol, clobetasol ointment, clotrimazole-betamethasone, diclofenac sodium, esomeprazole, ezetimibe, fenofibrate, finasteride, fluticasone-salmeterol, hydrochlorothiazide, multivitamin, oxyCODONE-acetaminophen, potassium chloride, pregabalin, sildenafil, and tamsulosin  History Review  Allergy: Mr. Huster is allergic to niacin-lovastatin er, atorvastatin, other, simvastatin, and propoxyphene. Drug: Mr. Findling  reports no history of drug use. Alcohol:  reports no history of alcohol use. Tobacco:  reports that he quit smoking about 9 years ago. His smoking  use included cigarettes. He has a 67.50 pack-year smoking history. He has never used smokeless tobacco. Social: Mr. Strey  reports that he quit smoking about 9 years ago. His smoking use included cigarettes. He has a 67.50 pack-year smoking history. He has never used smokeless tobacco. He reports that he does not drink alcohol and does not use drugs. Medical:  has a past medical history of Anxiety, Asthma, Benign essential HTN (06/24/2015), BPH (benign prostatic hyperplasia), COPD (chronic obstructive pulmonary disease) ([place]), Coronary artery disease (02/16/2014), DDD (degenerative disc disease), cervical, Degenerative disc disease, cervical (01/22/2017), Depression, DJD (degenerative joint disease), Emphysema of lung ([place]), Heel pain (10/10/2017), Hyperglobulinemia, Lumbar degenerative disc disease (01/22/2017), Major depression in remission ([place]) (03/10/2014), Migraines, Mitral regurgitation, Mixed hyperlipidemia (02/16/2014), Neck pain (01/24/2017), OSA (obstructive sleep apnea) (07/05/2016), Osteoarthritis of knee (08/12/2014), Prostate cancer ([place]) (2019), RA (rheumatoid arthritis) ([place]), Spinal stenosis of lumbar region with neurogenic claudication (04/18/2016), Spondylosis without myelopathy or radiculopathy, lumbar region (01/22/2017), and Venous insufficiency of both lower extremities (11/07/2016). Surgical: Mr. Ponciano  has a past surgical history that includes Cholecystectomy; Knee surgery (Left); deviated septum repair; Cardiac catheterization; Colonoscopy; Nasal septum surgery; Colonoscopy with propofol (N/A, 01/09/2018); and Esophagogastroduodenoscopy (egd)  with propofol (N/A, 01/09/2018). Family: family history includes Heart disease in his mother.  Laboratory Chemistry Profile   Renal Lab Results  Component Value Date   CREATININE 0.90 06/02/2020     Hepatic No results found for: "AST", "ALT", "ALBUMIN", "ALKPHOS", "HCVAB", "AMYLASE", "LIPASE", "AMMONIA"   Electrolytes No results found for:  "NA", "K", "CL", "CALCIUM", "MG", "PHOS"   Bone Lab Results  Component Value Date   TESTOSTERONE <3 (L) 04/29/2018     Inflammation (CRP: Acute Phase) (ESR: Chronic Phase) No results found for: "CRP", "ESRSEDRATE", "LATICACIDVEN"     Note: Above Lab results reviewed.   Physical Exam  General appearance: Well nourished, well developed, and well hydrated. In no apparent acute distress Mental status: Alert, oriented x 3 (person, place, & time)       Respiratory: No evidence of acute respiratory distress Eyes: PERLA Vitals: BP 132/78   Pulse 93   Temp (!) 97.2 F (36.2 C)   Resp 16   Ht _0  (1.702 m)   Wt 214 lb (97.1 kg)   SpO2 97%   BMI 33.52 kg/m  BMI: Estimated body mass index is 33.52 kg/m as calculated from the following:   Height as of this encounter: _1  (1.702 m).   Weight as of this encounter: 214 lb (97.1 kg). Ideal: Ideal body weight: 66.1 kg (145 lb 11.6 oz) Adjusted ideal body weight: 78.5 kg (173 lb 0.6 oz)  Right hand pain, swelling  Lumbar Spine Area Exam  Skin & Axial Inspection: No masses, redness, or swelling Alignment: Symmetrical Functional ROM: Pain restricted ROM affecting both sides Stability: No instability detected Muscle Tone/Strength: Functionally intact. No obvious neuro-muscular anomalies detected. Sensory (Neurological): Dermatomal pain pattern   Lower Extremity Exam      Side: Right lower extremity   Side: Left lower extremity  Stability: No instability observed           Stability: No instability observed          Skin & Extremity Inspection: Skin color, temperature, and hair growth are WNL. No peripheral edema or cyanosis. No masses, redness, swelling, asymmetry, or associated skin lesions. No contractures.   Skin & Extremity Inspection: Skin color, temperature, and hair growth are WNL. No peripheral edema or cyanosis. No masses, redness, swelling, asymmetry, or associated skin lesions. No contractures.  Functional ROM: Pain  restricted ROM for hip and knee joints           Functional ROM: Pain restricted ROM for hip and knee joints          Muscle Tone/Strength: Functionally intact. No obvious neuro-muscular anomalies detected.   Muscle Tone/Strength: Functionally intact. No obvious neuro-muscular anomalies detected.  Sensory (Neurological): Dermatomal pain pattern + SLR        Sensory (Neurological): Dermatomal pain pattern, +SLR   DTR: Patellar: deferred today Achilles: deferred today Plantar: deferred today   DTR: Patellar: deferred today Achilles: deferred today Plantar: deferred today  Palpation: No palpable anomalies   Palpation: No palpable anomalies    5 out of 5 strength bilateral lower extremity: Plantar flexion, dorsiflexion, knee flexion, knee extension.  Assessment   Status Diagnosis  Persistent Persistent Persistent 1. Chronic radicular lumbar pain   2. Spinal stenosis of lumbar region with neurogenic claudication   3. Neuroforaminal stenosis of lumbar spine   4. Lumbar spondylosis   5. OSA (obstructive sleep apnea)   6. Chronic pain syndrome       Plan of Care   Mr. Alfonzia  C Honea has a current medication list which includes the following long-term medication(s): albuterol sulfate, bupropion, duloxetine, esomeprazole, ezetimibe, fenofibrate, fluticasone-salmeterol, hydrochlorothiazide, potassium chloride, and pregabalin.  Pharmacotherapy (Medications Ordered): Meds ordered this encounter  Medications   oxyCODONE-acetaminophen (PERCOCET) 10-325 MG tablet    Sig: Take 1 tablet by mouth every 8 (eight) hours as needed for pain. Must last 30 days.    Dispense:  90 tablet    Refill:  0    Chronic Pain: STOP Act (Not applicable) Fill 1 day early if closed on refill date. Avoid benzodiazepines within 8 hours of opioids   oxyCODONE-acetaminophen (PERCOCET) 10-325 MG tablet    Sig: Take 1 tablet by mouth every 8 (eight) hours as needed for pain. Must last 30 days.    Dispense:  90  tablet    Refill:  0    Chronic Pain: STOP Act (Not applicable) Fill 1 day early if closed on refill date. Avoid benzodiazepines within 8 hours of opioids   oxyCODONE-acetaminophen (PERCOCET) 10-325 MG tablet    Sig: Take 1 tablet by mouth every 8 (eight) hours as needed for pain. Must last 30 days.    Dispense:  90 tablet    Refill:  0    Chronic Pain: STOP Act (Not applicable) Fill 1 day early if closed on refill date. Avoid benzodiazepines within 8 hours of opioids   pregabalin (LYRICA) 75 MG capsule    Sig: Take 1 capsule (75 mg total) by mouth at bedtime as needed.    Dispense:  90 capsule    Refill:  2    Fill one day early if pharmacy is closed on scheduled refill date. May substitute for generic if available.   Orders:  No orders of the defined types were placed in this encounter.  Follow-up plan:   Return in about 3 months (around 04/05/2022) for Medication Management, in person.    Recent Visits Date Type Provider Dept  10/06/21 Office Visit Gillis Santa, MD [facility]  Showing recent visits within past 90 days and meeting all other requirements Today's Visits Date Type Provider Dept  01/02/22 Office Visit Gillis Santa, MD [facility]  Showing today's visits and meeting all other requirements Future Appointments Date Type Provider Dept  03/28/22 Appointment Gillis Santa, MD [facility]  Showing future appointments within next 90 days and meeting all other requirements  I discussed the assessment and treatment plan with the patient. The patient was provided an opportunity to ask questions and all were answered. The patient agreed with the plan and demonstrated an understanding of the instructions.  Patient advised to call back or seek an in-person evaluation if the symptoms or condition worsens.  Duration of encounter:30 minutes.  Note by: Gillis Santa, MD Date: 01/02/2022; Time: 1:49 PM

## 2022-01-02 NOTE — Progress Notes (Signed)
Nursing Pain Medication Assessment:  Safety precautions to be maintained throughout the outpatient stay will include: orient to surroundings, keep bed in low position, maintain call bell within reach at all times, provide assistance with transfer out of bed and ambulation.  Medication Inspection Compliance: Pill count conducted under aseptic conditions, in front of the patient. Neither the pills nor the bottle was removed from the patient's sight at any time. Once count was completed pills were immediately returned to the patient in their original bottle.  Medication: Oxycodone/APAP Pill/Patch Count:  9 of 90 pills remain Pill/Patch Appearance: Markings consistent with prescribed medication Bottle Appearance: Standard pharmacy container. Clearly labeled. Filled Date: 33 / 2 / 2023 Last Medication intake:  Today

## 2022-01-05 ENCOUNTER — Encounter: Payer: Medicare HMO | Admitting: Student in an Organized Health Care Education/Training Program

## 2022-01-19 ENCOUNTER — Telehealth: Payer: Self-pay

## 2022-01-19 NOTE — Telephone Encounter (Addendum)
He will need the order for the new mattress written on a script like a medicine script. She said Dr. Holley Raring wrote a letter for them about it, but it needs to be on a script for insurance to approve it.

## 2022-01-23 NOTE — Telephone Encounter (Signed)
Called patient to see what we need to do for him. No answer. LVM.

## 2022-01-23 NOTE — Telephone Encounter (Signed)
I am glad to assist with this if OK with you. Please advise. Thank you.

## 2022-01-24 ENCOUNTER — Telehealth: Payer: Self-pay | Admitting: *Deleted

## 2022-01-24 NOTE — Telephone Encounter (Signed)
Called patient and informed him that script for mattress was ready for pick up.

## 2022-02-15 ENCOUNTER — Telehealth: Payer: Self-pay | Admitting: Student in an Organized Health Care Education/Training Program

## 2022-02-15 NOTE — Telephone Encounter (Signed)
Patient states his insurance is required a prior authorization for the mattress Dr. Holley Raring ordered. Please call patient for any further information.

## 2022-02-16 NOTE — Telephone Encounter (Signed)
Attempted to call for PA for mattress.  Holland Falling states that we need a procedure code or a DME code.  I do not know these codes. Well look into this.

## 2022-02-17 NOTE — Telephone Encounter (Signed)
Patient informed that we do no have access to the information needed to complete a PA for a mattress/

## 2022-03-28 ENCOUNTER — Ambulatory Visit
Payer: Medicare HMO | Attending: Student in an Organized Health Care Education/Training Program | Admitting: Student in an Organized Health Care Education/Training Program

## 2022-03-28 ENCOUNTER — Encounter: Payer: Self-pay | Admitting: Student in an Organized Health Care Education/Training Program

## 2022-03-28 VITALS — BP 133/81 | HR 90 | Temp 97.0°F | Ht 67.0 in | Wt 214.0 lb

## 2022-03-28 DIAGNOSIS — M5416 Radiculopathy, lumbar region: Secondary | ICD-10-CM | POA: Diagnosis present

## 2022-03-28 DIAGNOSIS — G894 Chronic pain syndrome: Secondary | ICD-10-CM | POA: Insufficient documentation

## 2022-03-28 DIAGNOSIS — G4733 Obstructive sleep apnea (adult) (pediatric): Secondary | ICD-10-CM | POA: Diagnosis present

## 2022-03-28 DIAGNOSIS — M47816 Spondylosis without myelopathy or radiculopathy, lumbar region: Secondary | ICD-10-CM | POA: Diagnosis present

## 2022-03-28 DIAGNOSIS — G8929 Other chronic pain: Secondary | ICD-10-CM | POA: Diagnosis present

## 2022-03-28 DIAGNOSIS — M48061 Spinal stenosis, lumbar region without neurogenic claudication: Secondary | ICD-10-CM | POA: Insufficient documentation

## 2022-03-28 DIAGNOSIS — M48062 Spinal stenosis, lumbar region with neurogenic claudication: Secondary | ICD-10-CM | POA: Insufficient documentation

## 2022-03-28 MED ORDER — OXYCODONE-ACETAMINOPHEN 10-325 MG PO TABS: 1.0000 | ORAL_TABLET | Freq: Three times a day (TID) | ORAL | 0 refills | Status: DC | PRN

## 2022-03-28 MED ORDER — OXYCODONE-ACETAMINOPHEN 10-325 MG PO TABS: 1.0000 | ORAL_TABLET | Freq: Three times a day (TID) | ORAL | 0 refills | Status: AC | PRN

## 2022-03-28 MED ORDER — DULOXETINE HCL 60 MG PO CPEP: 60.0000 mg | ORAL_CAPSULE | Freq: Every day | ORAL | 11 refills | Status: DC

## 2022-03-28 NOTE — Progress Notes (Signed)
PROVIDER NOTE: Information contained herein reflects review and annotations entered in association with encounter. Interpretation of such information and data should be left to medically-trained personnel. Information provided to patient can be located elsewhere in the medical record under "Patient Instructions". Document created using STT-dictation technology, any transcriptional errors that may result from process are unintentional.    Patient: Gary [name]  Service Category: E/M  Provider: Gillis Santa, MD  DOB: 1951-04-08  DOS: 03/28/2022  Specialty: Interventional Pain Management  MRN: [id number]  Setting: Ambulatory outpatient  PCP: Kirk Ruths, MD  Type: Established Patient    Referring Provider: Kirk Ruths, MD  Location: Office  Delivery: Face-to-face     HPI  [name], a 71 y.o. year old male, is here today because of his Chronic radicular lumbar pain [M54.16, G89.29]. [name] primary complain today is Back Pain (lower)  Last encounter: My last encounter with him was on 01/02/22  Pertinent problems: [name] has Lumbar spondylosis; Lumbar degenerative disc disease; Degenerative disc disease, cervical; Chronic pain syndrome; Facet arthropathy, lumbar; Spinal stenosis of lumbar region with neurogenic claudication; Chronic bilateral low back pain with left-sided sciatica; Chronic left sacroiliac joint pain; Chronic hip pain, left; Cervical spondylosis with radiculopathy; Groin pain, chronic, right; and Chronic left shoulder pain on their pertinent problem list. Pain Assessment: Severity of   is reported as a 6 /10. Location:    / . Onset:  . Quality:  . Timing:  . Modifying factor(s):  Marland Kitchen Vitals:  height is _0  (1.702 m) and weight is 214 lb (97.1 kg). His temporal temperature is 97 F (36.1 C) (abnormal). His blood pressure is 133/81 and his pulse is 90. His oxygen saturation is 95%.   Reason for encounter: medication management.    No change in  medical history since last visit.  Patient's pain is at baseline.  Patient continues multimodal pain regimen as prescribed.  States that it provides pain relief and improvement in functional status. [name] still has 1 prescription that he can pick up from his pharmacy at the end of this month.  Pharmacotherapy Assessment  Analgesic:  Percocet 10 mg TID prn, #90/month, MME=45   Monitoring: [place] PMP: PDMP reviewed during this encounter.       Pharmacotherapy: No side-effects or adverse reactions reported. Compliance: No problems identified. Effectiveness: Clinically acceptable.  UDS:  Summary  Date Value Ref Range Status  10/06/2021 Note  Final    Comment:    ==================================================================== [employer] [facility] (MW) ==================================================================== Test                             Result       Flag       Units  Drug Present and Declared for Prescription Verification   Oxycodone                      1838         EXPECTED   ng/mg creat   Oxymorphone                    749          EXPECTED   ng/mg creat   Noroxycodone                   1370         EXPECTED   ng/mg creat   Noroxymorphone  317          EXPECTED   ng/mg creat    Sources of oxycodone are scheduled prescription medications.    Oxymorphone, noroxycodone, and noroxymorphone are expected    metabolites of oxycodone. Oxymorphone is also available as a    scheduled prescription medication.  ==================================================================== Test                      Result    Flag   Units      Ref Range   Creatinine              146              mg/dL      >=20 ==================================================================== Declared Medications:  The flagging and interpretation on this report are based on the  following declared medications.  Unexpected results may arise from  inaccuracies in the declared  medications.   **Note: The testing scope of this panel includes these medications:   Oxycodone (Percocet)   **Note: The testing scope of this panel does not include the  following reported medications:   Acetaminophen (Tylenol)  Acetaminophen (Percocet)  Albuterol (Proair HFA)  Bempedoic Acid (Nexletol)  Betamethasone (Lotrisone)  Bupropion (Wellbutrin XL)  Buspirone (Buspar)  Clobetasol (Temovate)  Clotrimazole (Lotrisone)  Duloxetine (Cymbalta)  Esomeprazole (Nexium)  Ezetimibe (Zetia)  Fenofibrate (TriCor)  Finasteride (Proscar)  Fluticasone (Advair)  Hydrochlorothiazide (Hydrodiuril)  Magnesium  Multivitamin  Potassium (Klor-Con)  Pregabalin (Lyrica)  Salmeterol (Advair)  Sildenafil  Tamsulosin (Flomax)  Topical Diclofenac (Voltaren)  Vitamin D ==================================================================== For clinical consultation, please call [phone]. ====================================================================       ROS  Constitutional:  low back pain Gastrointestinal: No reported hemesis, hematochezia, vomiting, or acute GI distress Neurological:  LE radicular pain right L3, L4  Medication Review  Albuterol Sulfate, Bempedoic Acid, DULoxetine, Magnesium, acetaminophen, buPROPion, busPIRone, cholecalciferol, clobetasol ointment, clotrimazole-betamethasone, diclofenac sodium, esomeprazole, ezetimibe, fenofibrate, finasteride, fluticasone-salmeterol, hydrochlorothiazide, multivitamin, oxyCODONE-acetaminophen, potassium chloride, potassium chloride SA, pregabalin, sildenafil, and tamsulosin  History Review  Allergy: Mr. Armitage is allergic to niacin-lovastatin er, atorvastatin, other, simvastatin, and propoxyphene. Drug: Mr. Edmonds  reports no history of drug use. Alcohol:  reports no history of alcohol use. Tobacco:  reports that he quit smoking about 9 years ago. His smoking use included cigarettes. He has a 67.50 pack-year smoking  history. He has never used smokeless tobacco. Social: Mr. Soltis  reports that he quit smoking about 9 years ago. His smoking use included cigarettes. He has a 67.50 pack-year smoking history. He has never used smokeless tobacco. He reports that he does not drink alcohol and does not use drugs. Medical:  has a past medical history of Anxiety, Asthma, Benign essential HTN (06/24/2015), BPH (benign prostatic hyperplasia), COPD (chronic obstructive pulmonary disease) ([place]), Coronary artery disease (02/16/2014), DDD (degenerative disc disease), cervical, Degenerative disc disease, cervical (01/22/2017), Depression, DJD (degenerative joint disease), Emphysema of lung ([place]), Heel pain (10/10/2017), Hyperglobulinemia, Lumbar degenerative disc disease (01/22/2017), Major depression in remission ([place]) (03/10/2014), Migraines, Mitral regurgitation, Mixed hyperlipidemia (02/16/2014), Neck pain (01/24/2017), OSA (obstructive sleep apnea) (07/05/2016), Osteoarthritis of knee (08/12/2014), Prostate cancer ([place]) (2019), RA (rheumatoid arthritis) ([place]), Spinal stenosis of lumbar region with neurogenic claudication (04/18/2016), Spondylosis without myelopathy or radiculopathy, lumbar region (01/22/2017), and Venous insufficiency of both lower extremities (11/07/2016). Surgical: Mr. Massman  has a past surgical history that includes Cholecystectomy; Knee surgery (Left); deviated septum repair; Cardiac catheterization; Colonoscopy; Nasal septum surgery; Colonoscopy with propofol (N/A, 01/09/2018); and Esophagogastroduodenoscopy (egd)  with propofol (N/A, 01/09/2018). Family: family history includes Heart disease in his mother.  Laboratory Chemistry Profile   Renal Lab Results  Component Value Date   CREATININE 0.90 06/02/2020     Hepatic No results found for: "AST", "ALT", "ALBUMIN", "ALKPHOS", "HCVAB", "AMYLASE", "LIPASE", "AMMONIA"   Electrolytes No results found for: "NA", "K", "CL", "CALCIUM", "MG", "PHOS"   Bone Lab Results   Component Value Date   TESTOSTERONE <3 (L) 04/29/2018     Inflammation (CRP: Acute Phase) (ESR: Chronic Phase) No results found for: "CRP", "ESRSEDRATE", "LATICACIDVEN"     Note: Above Lab results reviewed.   Physical Exam  General appearance: Well nourished, well developed, and well hydrated. In no apparent acute distress Mental status: Alert, oriented x 3 (person, place, & time)       Respiratory: No evidence of acute respiratory distress Eyes: PERLA Vitals: BP 133/81 (BP Location: Right Arm, Patient Position: Sitting, Cuff Size: Large)   Pulse 90   Temp (!) 97 F (36.1 C) (Temporal)   Ht _0  (1.702 m)   Wt 214 lb (97.1 kg)   SpO2 95%   BMI 33.52 kg/m  BMI: Estimated body mass index is 33.52 kg/m as calculated from the following:   Height as of this encounter: _1  (1.702 m).   Weight as of this encounter: 214 lb (97.1 kg). Ideal: Ideal body weight: 66.1 kg (145 lb 11.6 oz) Adjusted ideal body weight: 78.5 kg (173 lb 0.6 oz)    Lumbar Spine Area Exam  Skin & Axial Inspection: No masses, redness, or swelling Alignment: Symmetrical Functional ROM: Pain restricted ROM affecting both sides Stability: No instability detected Muscle Tone/Strength: Functionally intact. No obvious neuro-muscular anomalies detected. Sensory (Neurological): Dermatomal pain pattern, right L3-L4   Lower Extremity Exam      Side: Right lower extremity   Side: Left lower extremity  Stability: No instability observed           Stability: No instability observed          Skin & Extremity Inspection: Skin color, temperature, and hair growth are WNL. No peripheral edema or cyanosis. No masses, redness, swelling, asymmetry, or associated skin lesions. No contractures.   Skin & Extremity Inspection: Skin color, temperature, and hair growth are WNL. No peripheral edema or cyanosis. No masses, redness, swelling, asymmetry, or associated skin lesions. No contractures.  Functional ROM: Pain restricted  ROM for hip and knee joints           Functional ROM: Pain restricted ROM for hip and knee joints          Muscle Tone/Strength: Functionally intact. No obvious neuro-muscular anomalies detected.   Muscle Tone/Strength: Functionally intact. No obvious neuro-muscular anomalies detected.  Sensory (Neurological): Dermatomal pain pattern + SLR        Sensory (Neurological): Dermatomal pain pattern, +SLR   DTR: Patellar: deferred today Achilles: deferred today Plantar: deferred today   DTR: Patellar: deferred today Achilles: deferred today Plantar: deferred today  Palpation: No palpable anomalies   Palpation: No palpable anomalies    5 out of 5 strength bilateral lower extremity: Plantar flexion, dorsiflexion, knee flexion, knee extension.  Assessment   Status Diagnosis  Persistent Persistent Persistent 1. Chronic radicular lumbar pain   2. Spinal stenosis of lumbar region with neurogenic claudication   3. Neuroforaminal stenosis of lumbar spine   4. Lumbar spondylosis   5. OSA (obstructive sleep apnea)   6. Chronic pain syndrome       Plan  of Care   Mr. WILBERT HAYASHI has a current medication list which includes the following long-term medication(s): albuterol sulfate, bupropion, esomeprazole, ezetimibe, fenofibrate, fluticasone-salmeterol, hydrochlorothiazide, potassium chloride, pregabalin, and duloxetine.  Pharmacotherapy (Medications Ordered): Meds ordered this encounter  Medications   oxyCODONE-acetaminophen (PERCOCET) 10-325 MG tablet    Sig: Take 1 tablet by mouth every 8 (eight) hours as needed for pain. Must last 30 days.    Dispense:  90 tablet    Refill:  0    Chronic Pain: STOP Act (Not applicable) Fill 1 day early if closed on refill date. Avoid benzodiazepines within 8 hours of opioids   oxyCODONE-acetaminophen (PERCOCET) 10-325 MG tablet    Sig: Take 1 tablet by mouth every 8 (eight) hours as needed for pain. Must last 30 days.    Dispense:  90 tablet     Refill:  0    Chronic Pain: STOP Act (Not applicable) Fill 1 day early if closed on refill date. Avoid benzodiazepines within 8 hours of opioids   DULoxetine (CYMBALTA) 60 MG capsule    Sig: Take 1 capsule (60 mg total) by mouth daily.    Dispense:  30 capsule    Refill:  11   Orders:  No orders of the defined types were placed in this encounter.  Follow-up plan:   Return in about 3 months (around 06/27/2022) for Medication Management, in person.    Recent Visits Date Type Provider Dept  01/02/22 Office Visit Gillis Santa, MD [facility]  Showing recent visits within past 90 days and meeting all other requirements Today's Visits Date Type Provider Dept  03/28/22 Office Visit Gillis Santa, MD [facility]  Showing today's visits and meeting all other requirements Future Appointments Date Type Provider Dept  06/22/22 Appointment Gillis Santa, MD [facility]  Showing future appointments within next 90 days and meeting all other requirements  I discussed the assessment and treatment plan with the patient. The patient was provided an opportunity to ask questions and all were answered. The patient agreed with the plan and demonstrated an understanding of the instructions.  Patient advised to call back or seek an in-person evaluation if the symptoms or condition worsens.  Duration of encounter:30 minutes.  Note by: Gillis Santa, MD Date: 03/28/2022; Time: 3:23 PM

## 2022-03-28 NOTE — Progress Notes (Signed)
Nursing Pain Medication Assessment:  Safety precautions to be maintained throughout the outpatient stay will include: orient to surroundings, keep bed in low position, maintain call bell within reach at all times, provide assistance with transfer out of bed and ambulation.  Medication Inspection Compliance: Pill count conducted under aseptic conditions, in front of the patient. Neither the pills nor the bottle was removed from the patient's sight at any time. Once count was completed pills were immediately returned to the patient in their original bottle.  Medication: Hydrocodone/APAP Pill/Patch Count:  33 of 90 pills remain Pill/Patch Appearance: Markings consistent with prescribed medication Bottle Appearance: Standard pharmacy container. Clearly labeled. Filled Date: 76 / 1 / 2023 Last Medication intake:  TodaySafety precautions to be maintained throughout the outpatient stay will include: orient to surroundings, keep bed in low position, maintain call bell within reach at all times, provide assistance with transfer out of bed and ambulation.

## 2022-03-31 ENCOUNTER — Telehealth: Payer: Self-pay | Admitting: Student in an Organized Health Care Education/Training Program

## 2022-03-31 NOTE — Telephone Encounter (Signed)
Patient states Gary [name] was looking into some codes for insurance to cover a therapeutic mattress. His insurance requires them before consideration to cover cost. Can anyone help him with this. I explained it would be next week. He spoke with his PCP and they said they had never heard of such a thing. He does not expect a call today. If some one can let him know next week.

## 2022-04-05 ENCOUNTER — Telehealth: Payer: Self-pay | Admitting: Student in an Organized Health Care Education/Training Program

## 2022-04-05 NOTE — Telephone Encounter (Signed)
Attempted to call patient. No answer, no voicemail.

## 2022-04-05 NOTE — Telephone Encounter (Signed)
PT stated he was returning a call. Please give patient a call back.

## 2022-06-09 ENCOUNTER — Encounter: Payer: Self-pay | Admitting: Cardiology

## 2022-06-09 ENCOUNTER — Ambulatory Visit: Payer: Medicare HMO | Attending: Cardiology | Admitting: Cardiology

## 2022-06-09 ENCOUNTER — Other Ambulatory Visit: Payer: Self-pay

## 2022-06-09 VITALS — BP 130/84 | HR 86 | Ht 66.5 in | Wt 216.4 lb

## 2022-06-09 DIAGNOSIS — I25118 Atherosclerotic heart disease of native coronary artery with other forms of angina pectoris: Secondary | ICD-10-CM

## 2022-06-09 DIAGNOSIS — R072 Precordial pain: Secondary | ICD-10-CM

## 2022-06-09 DIAGNOSIS — I10 Essential (primary) hypertension: Secondary | ICD-10-CM

## 2022-06-09 DIAGNOSIS — E782 Mixed hyperlipidemia: Secondary | ICD-10-CM | POA: Diagnosis not present

## 2022-06-09 DIAGNOSIS — R079 Chest pain, unspecified: Secondary | ICD-10-CM

## 2022-06-09 MED ORDER — IVABRADINE HCL 5 MG PO TABS
10.0000 mg | ORAL_TABLET | Freq: Once | ORAL | 0 refills | Status: DC
  Filled 2022-06-09: qty 2, 1d supply, fill #0

## 2022-06-09 MED ORDER — METOPROLOL TARTRATE 100 MG PO TABS
100.0000 mg | ORAL_TABLET | Freq: Once | ORAL | 0 refills | Status: DC
  Filled 2022-06-09: qty 1, 1d supply, fill #0

## 2022-06-09 NOTE — Progress Notes (Signed)
Cardiology Office Note:    Date:  06/09/2022   ID:  Gary [name], DOB 09-13-1950, MRN [id number]  PCP:  Kirk Ruths, MD   [facility] Providers Cardiologist:  None     Referring MD: Kirk Ruths, MD   Chief Complaint  Patient presents with   New Patient (Initial Visit)    CAD, Cadiac Hx, some chest pain,     History of Present Illness:    Gary [name] is a 72 y.o. male with a hx of hyperlipidemia, hypertension, former smoker x 40+ years who presents due to chest pain.  Chest pain occurs on and off over the past year, not related with exertion.  Previously seen at [facility] cardiology from a cardiac perspective.  Lexiscan Myoview 2019 showed no ischemia, echo 2019 EF 55%.   CT chest lung cancer screening 6/23 showed left circumflex calcifications.    Patient is allergic to statins, currently taking Zetia and bempedoic acid.  Compliant with BP meds as prescribed.  Past Medical History:  Diagnosis Date   Anxiety    Asthma    Benign essential HTN 06/24/2015   Last Assessment & Plan:  Taking medications without noted side effects or dizziness.   Overview:  Last Assessment & Plan:  Is compliant with hypertensive medications without clear side effects or lack of control.     BPH (benign prostatic hyperplasia)    COPD (chronic obstructive pulmonary disease) (HCC)    Coronary artery disease 02/16/2014   Overview:  Minimal disease by cath 12/13  Last Assessment & Plan:  Seems to be tolerating medical regimen without significant side effects and symptoms such as worsening chest pain are not noted.   Overview:  Overview:  Minimal disease by cath 12/13  Last Assessment & Plan:  Seems to be tolerating medical regimen without significant side effects and symptoms such as worsening chest pain are not no   DDD (degenerative disc disease), cervical    Degenerative disc disease, cervical 01/22/2017   Depression    DJD (degenerative joint disease)    Emphysema of  lung ([place])    Heel pain 10/10/2017   Hyperglobulinemia    Lumbar degenerative disc disease 01/22/2017   Major depression in remission ([place]) 03/10/2014   Last Assessment & Plan:  Mood is doing well on meds Overview:  Last Assessment & Plan:  Mood is doing well on meds    Migraines    Mitral regurgitation    Mixed hyperlipidemia 02/16/2014   Last Assessment & Plan:  Diet for healthy cholesterol is being attempted and no clear myalgia's or other side effects are noted.   Overview:  Last Assessment & Plan:  Low fat diet is being attempted and no significant side effects such as myalgia's are noted.     Neck pain 01/24/2017   OSA (obstructive sleep apnea) 07/05/2016   Last Assessment & Plan:  Continues to use cpap consistently and is continuing to benefit from it's use.     Osteoarthritis of knee 08/12/2014   Overview:  Right knee is worse now  Last Assessment & Plan:  Diffuse pain on back in hips and knees also. Went to a pain clinic  Overview:  Overview:  Right knee is worse now  Last Assessment & Plan:  Diffuse pain on back in hips and knees also. Went to a pain clinic    Prostate cancer ([place]) 2019   Rad Tx's.    RA (rheumatoid arthritis) ([place])    Spinal stenosis  of lumbar region with neurogenic claudication 04/18/2016   Spondylosis without myelopathy or radiculopathy, lumbar region 01/22/2017   Venous insufficiency of both lower extremities 11/07/2016    Past Surgical History:  Procedure Laterality Date   CARDIAC CATHETERIZATION     CHOLECYSTECTOMY     COLONOSCOPY     COLONOSCOPY WITH PROPOFOL N/A 01/09/2018   Procedure: COLONOSCOPY WITH PROPOFOL;  Surgeon: Toledo, Benay Pike, MD;  Location: [facility] ENDOSCOPY;  Service: Gastroenterology;  Laterality: N/A;   deviated septum repair     ESOPHAGOGASTRODUODENOSCOPY (EGD) WITH PROPOFOL N/A 01/09/2018   Procedure: ESOPHAGOGASTRODUODENOSCOPY (EGD) WITH PROPOFOL;  Surgeon: Toledo, Benay Pike, MD;  Location: [facility] ENDOSCOPY;  Service: Gastroenterology;   Laterality: N/A;   KNEE SURGERY Left    NASAL SEPTUM SURGERY      Current Medications: Current Meds  Medication Sig   acetaminophen (TYLENOL) 500 MG tablet Take as needed by mouth.    Albuterol Sulfate 108 (90 Base) MCG/ACT AEPB Inhale as needed into the lungs.    busPIRone (BUSPAR) 15 MG tablet Take 15 mg by mouth 2 (two) times a day.   cholecalciferol (VITAMIN D) 1000 units tablet Take by mouth.   clobetasol ointment (TEMOVATE) AB-[id number] % Apply 1 application topically 2 (two) times daily.   clotrimazole-betamethasone (LOTRISONE) cream Apply 1 application topically 2 (two) times daily.   diclofenac sodium (VOLTAREN) 1 % GEL Apply topically 4 (four) times daily.   DULoxetine (CYMBALTA) 60 MG capsule Take 1 capsule (60 mg total) by mouth daily.   esomeprazole (NEXIUM) 40 MG capsule Take 40 mg by mouth daily.   ezetimibe (ZETIA) 10 MG tablet Take 10 mg by mouth daily.    fluticasone-salmeterol (ADVAIR) 100-50 MCG/ACT AEPB Inhale into the lungs.   hydrochlorothiazide (HYDRODIURIL) 25 MG tablet    ivabradine (CORLANOR) 5 MG TABS tablet Take 2 tablets (10 mg total) by mouth once for 1 dose. 2 hours prior to CT   meloxicam (MOBIC) 7.5 MG tablet Take 7.5 mg by mouth daily.   metoprolol tartrate (LOPRESSOR) 100 MG tablet Take 1 tablet (100 mg total) by mouth once for 1 dose. 2 hours prior to CT   Multiple Vitamin (MULTIVITAMIN) tablet Take 1 tablet by mouth daily.   oxyCODONE-acetaminophen (PERCOCET) 10-325 MG tablet Take 1 tablet by mouth every 8 (eight) hours as needed for pain. Must last 30 days.   potassium chloride SA (KLOR-CON M) 20 MEQ tablet Take 20 mEq by mouth daily.   pregabalin (LYRICA) 75 MG capsule Take 1 capsule (75 mg total) by mouth at bedtime as needed.   sildenafil (REVATIO) 20 MG tablet Take 3 to 5 tablets two hours before intercouse on an empty stomach.  Do not take with nitrates.   tamsulosin (FLOMAX) 0.4 MG CAPS capsule TAKE 1 CAPSULE EVERY DAY AFTER SUPPER   Current  Facility-Administered Medications for the 06/09/22 encounter (Office Visit) with Kate Sable, MD  Medication   leuprolide (6 Month) (ELIGARD) injection 45 mg     Allergies:   Niacin-lovastatin er, Atorvastatin, Other, Simvastatin, and Propoxyphene   Social History   Socioeconomic History   Marital status: Married    Spouse name: Not on file   Number of children: Not on file   Years of education: Not on file   Highest education level: Not on file  Occupational History   Not on file  Tobacco Use   Smoking status: Former    Packs/day: 1.50    Years: 45.00    Total pack years: 67.50  Types: Cigarettes    Quit date: 2014    Years since quitting: 10.1   Smokeless tobacco: Never  Vaping Use   Vaping Use: Never used  Substance and Sexual Activity   Alcohol use: No   Drug use: No   Sexual activity: Not on file  Other Topics Concern   Not on file  Social History Narrative   Not on file   Social Determinants of Health   Financial Resource Strain: Not on file  Food Insecurity: Not on file  Transportation Needs: Not on file  Physical Activity: Not on file  Stress: Not on file  Social Connections: Not on file     Family History: The patient's family history includes Heart disease in his mother.  ROS:   Please see the history of present illness.     All other systems reviewed and are negative.  EKGs/Labs/Other Studies Reviewed:    The following studies were reviewed today:   EKG:  EKG is  ordered today.  The ekg ordered today demonstrates normal sinus rhythm, right bundle branch block  Recent Labs: No results found for requested labs within last 365 days.  Recent Lipid Panel No results found for: "CHOL", "TRIG", "HDL", "CHOLHDL", "VLDL", "LDLCALC", "LDLDIRECT"   Risk Assessment/Calculations:             Physical Exam:    VS:  BP 130/84 (BP Location: Left Arm, Patient Position: Sitting, Cuff Size: Normal)   Pulse 86   Ht 5' 6.5" (1.689 m)   Wt 216  lb 6.4 oz (98.2 kg)   SpO2 97%   BMI 34.40 kg/m     Wt Readings from Last 3 Encounters:  06/09/22 216 lb 6.4 oz (98.2 kg)  03/28/22 214 lb (97.1 kg)  01/02/22 214 lb (97.1 kg)     GEN:  Well nourished, well developed in no acute distress HEENT: Normal NECK: No JVD; No carotid bruits CARDIAC: RRR, no murmurs, rubs, gallops RESPIRATORY:  Clear to auscultation without rales, wheezing or rhonchi  ABDOMEN: Soft, non-tender, non-distended MUSCULOSKELETAL:  No edema; No deformity  SKIN: Warm and dry NEUROLOGIC:  Alert and oriented x 3 PSYCHIATRIC:  Normal affect   ASSESSMENT:    1. Precordial pain   2. Coronary artery disease of native artery of native heart with stable angina pectoris ([place])   3. Primary hypertension   4. Mixed hyperlipidemia   5. Chest pain, unspecified type    PLAN:    In order of problems listed above:  Chest pain, risk factors hyperlipidemia, hypertension, former smoker.  Get echo, get coronary CTA. CAD/left circumflex calcification on chest CT.  Start aspirin 81 mg daily, continue Zetia, bempedoic acid. Hypertension, HCTZ 25 mg daily. Hyperlipidemia, Zetia, bempedoic acid.  Follow-up after echo and coronary CTA.      Medication Adjustments/Labs and Tests Ordered: Current medicines are reviewed at length with the patient today.  Concerns regarding medicines are outlined above.  Orders Placed This Encounter  Procedures   CT CORONARY MORPH W/CTA COR W/SCORE W/CA W/CM &/OR WO/CM   CBC   Basic Metabolic Panel (BMET)   EKG 12-Lead   ECHOCARDIOGRAM COMPLETE   Meds ordered this encounter  Medications   metoprolol tartrate (LOPRESSOR) 100 MG tablet    Sig: Take 1 tablet (100 mg total) by mouth once for 1 dose. 2 hours prior to CT    Dispense:  1 tablet    Refill:  0    use discounted cash price   ivabradine (CORLANOR)  5 MG TABS tablet    Sig: Take 2 tablets (10 mg total) by mouth once for 1 dose. 2 hours prior to CT    Dispense:  2 tablet     Refill:  0    use discounted cash price    Patient Instructions  Medication Instructions:  Your physician recommends that you continue on your current medications as directed. Please refer to the Current Medication list given to you today.  *If you need a refill on your cardiac medications before your next appointment, please call your pharmacy*   Lab Work: Your physician recommends that you return for lab work to be completed within 30 days of cardiac CT: CBC & [place] Entrance at [facility] 1st desk on the right to check in (REGISTRATION)  Lab hours: Monday- Friday (7:30 am- 5:30 pm)  If you have labs (blood work) drawn today and your tests are completely normal, you will receive your results only by: MyChart Message (if you have MyChart) OR A paper copy in the mail If you have any lab test that is abnormal or we need to change your treatment, we will call you to review the results.   Testing/Procedures: Your physician has requested that you have an echocardiogram. Echocardiography is a painless test that uses sound waves to create images of your heart. It provides your doctor with information about the size and shape of your heart and how well your heart's chambers and valves are working. This procedure takes approximately one hour. There are no restrictions for this procedure. Please do NOT wear cologne, perfume, aftershave, or lotions (deodorant is allowed). Please arrive 15 minutes prior to your appointment time.     Your cardiac CT will be scheduled at one of the below locations:   [facility] [address] [place], [place] [address] (336) [facility] [place], [place] [address] [phone]  If scheduled at [facility] or [facility] [place], please arrive 15 mins early for check-in and test prep.  Please follow these  instructions carefully (unless otherwise directed):  Hold all erectile dysfunction medications at least 3 days (72 hrs) prior to test. (Ie viagra, cialis, sildenafil, tadalafil, etc) We will administer nitroglycerin during this exam.   On the Night Before the Test: Be sure to Drink plenty of water. Do not consume any caffeinated/decaffeinated beverages or chocolate 12 hours prior to your test. Do not take any antihistamines 12 hours prior to your test.  On the Day of the Test: Drink plenty of water until 1 hour prior to the test. Do not eat any food 1 hour prior to test. You may take your regular medications prior to the test.  Take metoprolol (Lopressor) and ivabradine (CORLANOR) tablet(s) two hours prior to test. If you take Furosemide/Hydrochlorothiazide/Spironolactone, please HOLD on the morning of the test.     After the Test: Drink plenty of water. After receiving IV contrast, you may experience a mild flushed feeling. This is normal. On occasion, you may experience a mild rash up to 24 hours after the test. This is not dangerous. If this occurs, you can take Benadryl 25 mg and increase your fluid intake. If you experience trouble breathing, this can be serious. If it is severe call 911 IMMEDIATELY. If it is mild, please call our office. If you take any of these medications: Glipizide/Metformin, Avandament, Glucavance, please do not take 48 hours  after completing test unless otherwise instructed.  We will call to schedule your test 2-4 weeks out understanding that some insurance companies will need an authorization prior to the service being performed.   For non-scheduling related questions, please contact the cardiac imaging nurse navigator should you have any questions/concerns: Marchia Bond, Cardiac Imaging Nurse Navigator Gordy Clement, Cardiac Imaging Nurse Navigator [facility] Direct Office Dial: [phone]   For scheduling needs, including  cancellations and rescheduling, please call [place], [phone].   Follow-Up: At [facility], you and your health needs are our priority.  As part of our continuing mission to provide you with exceptional heart care, we have created designated Provider Care Teams.  These Care Teams include your primary Cardiologist (physician) and Advanced Practice Providers (APPs -  Physician Assistants and Nurse Practitioners) who all work together to provide you with the care you need, when you need it.  We recommend signing up for the patient portal called "MyChart".  Sign up information is provided on this After Visit Summary.  MyChart is used to connect with patients for Virtual Visits (Telemedicine).  Patients are able to view lab/test results, encounter notes, upcoming appointments, etc.  Non-urgent messages can be sent to your provider as well.   To learn more about what you can do with MyChart, go to [email or web address].    Your next appointment:   After testing completed   Provider:   You may see Kate Sable, MD or one of the following Advanced Practice Providers on your designated Care Team:   Murray Hodgkins, NP Christell Faith, PA-C Cadence Kathlen Mody, PA-C Gerrie Nordmann, NP   Other Instructions -None    Signed, Kate Sable, MD  06/09/2022 4:13 PM    [place]

## 2022-06-09 NOTE — Patient Instructions (Signed)
Medication Instructions:  Your physician recommends that you continue on your current medications as directed. Please refer to the Current Medication list given to you today.  *If you need a refill on your cardiac medications before your next appointment, please call your pharmacy*   Lab Work: Your physician recommends that you return for lab work to be completed within 30 days of cardiac CT: CBC & [place] Entrance at [facility] 1st desk on the right to check in (REGISTRATION)  Lab hours: Monday- Friday (7:30 am- 5:30 pm)  If you have labs (blood work) drawn today and your tests are completely normal, you will receive your results only by: MyChart Message (if you have MyChart) OR A paper copy in the mail If you have any lab test that is abnormal or we need to change your treatment, we will call you to review the results.   Testing/Procedures: Your physician has requested that you have an echocardiogram. Echocardiography is a painless test that uses sound waves to create images of your heart. It provides your doctor with information about the size and shape of your heart and how well your heart's chambers and valves are working. This procedure takes approximately one hour. There are no restrictions for this procedure. Please do NOT wear cologne, perfume, aftershave, or lotions (deodorant is allowed). Please arrive 15 minutes prior to your appointment time.     Your cardiac CT will be scheduled at one of the below locations:   [facility] [address] [place], [place] [address] (336) [facility] [place], [place] [address] [phone]  If scheduled at [facility] or [facility], please arrive 15 mins early for check-in and test prep.  Please follow these instructions carefully (unless otherwise directed):  Hold all erectile  dysfunction medications at least 3 days (72 hrs) prior to test. (Ie viagra, cialis, sildenafil, tadalafil, etc) We will administer nitroglycerin during this exam.   On the Night Before the Test: Be sure to Drink plenty of water. Do not consume any caffeinated/decaffeinated beverages or chocolate 12 hours prior to your test. Do not take any antihistamines 12 hours prior to your test.  On the Day of the Test: Drink plenty of water until 1 hour prior to the test. Do not eat any food 1 hour prior to test. You may take your regular medications prior to the test.  Take metoprolol (Lopressor) and ivabradine (CORLANOR) tablet(s) two hours prior to test. If you take Furosemide/Hydrochlorothiazide/Spironolactone, please HOLD on the morning of the test.     After the Test: Drink plenty of water. After receiving IV contrast, you may experience a mild flushed feeling. This is normal. On occasion, you may experience a mild rash up to 24 hours after the test. This is not dangerous. If this occurs, you can take Benadryl 25 mg and increase your fluid intake. If you experience trouble breathing, this can be serious. If it is severe call 911 IMMEDIATELY. If it is mild, please call our office. If you take any of these medications: Glipizide/Metformin, Avandament, Glucavance, please do not take 48 hours after completing test unless otherwise instructed.  We will call to schedule your test 2-4 weeks out understanding that some insurance companies will need an authorization prior to the service being performed.   For non-scheduling related questions, please contact the cardiac imaging nurse navigator should you have any questions/concerns: Marchia Bond,  Cardiac Imaging Nurse Navigator Gordy Clement, Cardiac Imaging Nurse Navigator [place] [facility] Direct Office Dial: [phone]   For scheduling needs, including cancellations and rescheduling, please call [place], [phone].    Follow-Up: At [facility], you and your health needs are our priority.  As part of our continuing mission to provide you with exceptional heart care, we have created designated Provider Care Teams.  These Care Teams include your primary Cardiologist (physician) and Advanced Practice Providers (APPs -  Physician Assistants and Nurse Practitioners) who all work together to provide you with the care you need, when you need it.  We recommend signing up for the patient portal called "MyChart".  Sign up information is provided on this After Visit Summary.  MyChart is used to connect with patients for Virtual Visits (Telemedicine).  Patients are able to view lab/test results, encounter notes, upcoming appointments, etc.  Non-urgent messages can be sent to your provider as well.   To learn more about what you can do with MyChart, go to [email or web address].    Your next appointment:   After testing completed   Provider:   You may see Kate Sable, MD or one of the following Advanced Practice Providers on your designated Care Team:   Murray Hodgkins, NP Christell Faith, PA-C Cadence Kathlen Mody, PA-C Gerrie Nordmann, NP   Other Instructions -None

## 2022-06-12 ENCOUNTER — Other Ambulatory Visit: Payer: Self-pay

## 2022-06-12 ENCOUNTER — Telehealth: Payer: Self-pay | Admitting: Cardiology

## 2022-06-12 ENCOUNTER — Other Ambulatory Visit
Admission: RE | Admit: 2022-06-12 | Discharge: 2022-06-12 | Disposition: A | Discharge status: Home / Self Care | Payer: Medicare HMO | Source: Ambulatory Visit | Attending: Cardiology | Admitting: Cardiology

## 2022-06-12 DIAGNOSIS — I25118 Atherosclerotic heart disease of native coronary artery with other forms of angina pectoris: Secondary | ICD-10-CM | POA: Insufficient documentation

## 2022-06-12 DIAGNOSIS — R072 Precordial pain: Secondary | ICD-10-CM | POA: Diagnosis present

## 2022-06-12 LAB — BASIC METABOLIC PANEL
Anion gap: 14 (ref 5–15)
BUN: 25 mg/dL — ABNORMAL HIGH (ref 8–23)
CO2: 19 mmol/L — ABNORMAL LOW (ref 22–32)
Calcium: 9.5 mg/dL (ref 8.9–10.3)
Chloride: 103 mmol/L (ref 98–111)
Creatinine, Ser: 0.91 mg/dL (ref 0.61–1.24)
GFR, Estimated: >60 mL/min (ref >60)
Glucose, Bld: 155 mg/dL — ABNORMAL HIGH (ref 70–99)
Potassium: 4.2 mmol/L (ref 3.5–5.1)
Sodium: 136 mmol/L (ref 135–145)

## 2022-06-12 LAB — CBC
HCT: 42.8 % (ref 39.0–52.0)
Hemoglobin: 14.3 g/dL (ref 13.0–17.0)
MCH: 32.2 pg (ref 26.0–34.0)
MCHC: 33.4 g/dL (ref 30.0–36.0)
MCV: 96.4 fL (ref 80.0–100.0)
Platelets: 238 10*3/uL (ref 150–400)
RBC: 4.44 MIL/uL (ref 4.22–5.81)
RDW: 12.9 % (ref 11.5–15.5)
WBC: 7.1 10*3/uL (ref 4.0–10.5)
nRBC: 0 % (ref 0.0–0.2)

## 2022-06-12 MED ORDER — METOPROLOL TARTRATE 100 MG PO TABS: 100.0000 mg | ORAL_TABLET | Freq: Once | ORAL | 0 refills | Status: DC

## 2022-06-12 MED ORDER — IVABRADINE HCL 5 MG PO TABS: 10.0000 mg | ORAL_TABLET | Freq: Once | ORAL | 0 refills | Status: AC

## 2022-06-12 NOTE — Telephone Encounter (Signed)
Spoke with patient and he stated that the ivabradine (CORLANOR) 5 MG TABS tablet 10 mg, one time dose and metoprolol tartrate (LOPRESSOR) 100 MG tablet one time dose for cardiac CT is cheaper at his pharmacy and needed it sent there instead. Rx sent to pharmacy of choice.

## 2022-06-12 NOTE — Telephone Encounter (Signed)
Patient called and had questions regarding medication he supposed to take a couple of hours prior to CT. Also patient wants to send medication to [facility]

## 2022-06-16 ENCOUNTER — Telehealth: Payer: Self-pay | Admitting: Cardiology

## 2022-06-16 NOTE — Telephone Encounter (Signed)
Pt c/o medication issue:  1. Name of Medication: meloxicam (MOBIC) 7.5 MG tablet   2. How are you currently taking this medication (dosage and times per day)?    Take 7.5 mg by mouth daily.    3. Are you having a reaction (difficulty breathing--STAT)? no  4. What is your medication issue? Patient wants to know if he is to stop taking this medication.  He believes he was told to stop it.  He stated he has to run to the store, he said he gives permission to speak to his wife Xzavior Connally.

## 2022-06-19 NOTE — Telephone Encounter (Signed)
Called patient.  Made him aware of Dr. Garen Lah recommendations :  Kate Sable, MD  You8 minutes ago (1:24 PM)   Okay to stop this medication (mobic) from a cardiac perspective.  However, I did not prescribe this medication.  Patient states should talk with PCP or prescriber regarding stopping medication.   Patient thanked me for the call.

## 2022-06-22 ENCOUNTER — Other Ambulatory Visit: Payer: Self-pay | Admitting: Radiation Oncology

## 2022-06-22 ENCOUNTER — Encounter: Payer: Self-pay | Admitting: Student in an Organized Health Care Education/Training Program

## 2022-06-22 ENCOUNTER — Ambulatory Visit
Payer: Medicare HMO | Attending: Student in an Organized Health Care Education/Training Program | Admitting: Student in an Organized Health Care Education/Training Program

## 2022-06-22 VITALS — BP 129/79 | HR 79 | Temp 97.1°F | Ht 66.0 in | Wt 214.0 lb

## 2022-06-22 DIAGNOSIS — M48062 Spinal stenosis, lumbar region with neurogenic claudication: Secondary | ICD-10-CM | POA: Diagnosis not present

## 2022-06-22 DIAGNOSIS — G4733 Obstructive sleep apnea (adult) (pediatric): Secondary | ICD-10-CM | POA: Diagnosis present

## 2022-06-22 DIAGNOSIS — M47816 Spondylosis without myelopathy or radiculopathy, lumbar region: Secondary | ICD-10-CM

## 2022-06-22 DIAGNOSIS — M5416 Radiculopathy, lumbar region: Secondary | ICD-10-CM

## 2022-06-22 DIAGNOSIS — G8929 Other chronic pain: Secondary | ICD-10-CM

## 2022-06-22 DIAGNOSIS — G894 Chronic pain syndrome: Secondary | ICD-10-CM

## 2022-06-22 DIAGNOSIS — M48061 Spinal stenosis, lumbar region without neurogenic claudication: Secondary | ICD-10-CM | POA: Diagnosis not present

## 2022-06-22 MED ORDER — OXYCODONE-ACETAMINOPHEN 10-325 MG PO TABS: 1.0000 | ORAL_TABLET | Freq: Three times a day (TID) | ORAL | 0 refills | Status: AC | PRN

## 2022-06-22 MED ORDER — PREGABALIN 75 MG PO CAPS: 75.0000 mg | ORAL_CAPSULE | Freq: Every evening | ORAL | 2 refills | Status: DC | PRN

## 2022-06-22 NOTE — Progress Notes (Signed)
Nursing Pain Medication Assessment:  Safety precautions to be maintained throughout the outpatient stay will include: orient to surroundings, keep bed in low position, maintain call bell within reach at all times, provide assistance with transfer out of bed and ambulation.  Medication Inspection Compliance: Pill count conducted under aseptic conditions, in front of the patient. Neither the pills nor the bottle was removed from the patient's sight at any time. Once count was completed pills were immediately returned to the patient in their original bottle.  Medication: Oxycodone/APAP Pill/Patch Count: No pills available to be counted. Pill/Patch Appearance: Markings consistent with prescribed medication Bottle Appearance: Standard pharmacy container. Clearly labeled. Filled Date: 02 / 03 / 2024 Last Medication intake:  YesterdaySafety precautions to be maintained throughout the outpatient stay will include: orient to surroundings, keep bed in low position, maintain call bell within reach at all times, provide assistance with transfer out of bed and ambulation.

## 2022-06-22 NOTE — Progress Notes (Signed)
PROVIDER NOTE: Information contained herein reflects review and annotations entered in association with encounter. Interpretation of such information and data should be left to medically-trained personnel. Information provided to patient can be located elsewhere in the medical record under "Patient Instructions". Document created using STT-dictation technology, any transcriptional errors that may result from process are unintentional.    Patient: Gary [name]  Service Category: E/M  Provider: Gillis Santa, MD  DOB: 03/18/1951  DOS: 06/22/2022  Specialty: Interventional Pain Management  MRN: [id number]  Setting: Ambulatory outpatient  PCP: Kirk Ruths, MD  Type: Established Patient    Referring Provider: Kirk Ruths, MD  Location: Office  Delivery: Face-to-face     HPI  [name], a 72 y.o. year old male, is here today because of his Chronic radicular lumbar pain [M54.16, G89.29]. [name] primary complain today is Back Pain (lower)  Last encounter: My last encounter with him was on 03/28/22  Pertinent problems: [name] has Lumbar spondylosis; Lumbar degenerative disc disease; Degenerative disc disease, cervical; Chronic pain syndrome; Facet arthropathy, lumbar; Spinal stenosis of lumbar region with neurogenic claudication; Chronic bilateral low back pain with left-sided sciatica; Chronic left sacroiliac joint pain; Chronic hip pain, left; Cervical spondylosis with radiculopathy; Groin pain, chronic, right; and Chronic left shoulder pain on their pertinent problem list. Pain Assessment: Severity of Chronic pain is reported as a 6 /10. Location: Back Right, Left, Lower/radiates down legs to calf. Onset: More than a month ago. Quality: Constant. Timing: Constant. Modifying factor(s): meds. Vitals:  height is '5\' 6"'$  (1.676 m) and weight is 214 lb (97.1 kg). His temporal temperature is 97.1 F (36.2 C) (abnormal). His blood pressure is 129/79 and his pulse is 79.  His oxygen saturation is 97%.   Reason for encounter: medication management.    No change in medical history since last visit.  Patient's pain is at baseline.  Patient continues multimodal pain regimen as prescribed.  States that it provides pain relief and improvement in functional status.   Pharmacotherapy Assessment  Analgesic:  Percocet 10 mg TID prn, #90/month, MME=45   Monitoring: [place] PMP: PDMP reviewed during this encounter.       Pharmacotherapy: No side-effects or adverse reactions reported. Compliance: No problems identified. Effectiveness: Clinically acceptable.  UDS:  Summary  Date Value Ref Range Status  10/06/2021 Note  Final    Comment:    ==================================================================== [employer] [facility] (MW) ==================================================================== Test                             Result       Flag       Units  Drug Present and Declared for Prescription Verification   Oxycodone                      1838         EXPECTED   ng/mg creat   Oxymorphone                    749          EXPECTED   ng/mg creat   Noroxycodone                   1370         EXPECTED   ng/mg creat   Noroxymorphone                 317  EXPECTED   ng/mg creat    Sources of oxycodone are scheduled prescription medications.    Oxymorphone, noroxycodone, and noroxymorphone are expected    metabolites of oxycodone. Oxymorphone is also available as a    scheduled prescription medication.  ==================================================================== Test                      Result    Flag   Units      Ref Range   Creatinine              146              mg/dL      >=20 ==================================================================== Declared Medications:  The flagging and interpretation on this report are based on the  following declared medications.  Unexpected results may arise from  inaccuracies in the declared  medications.   **Note: The testing scope of this panel includes these medications:   Oxycodone (Percocet)   **Note: The testing scope of this panel does not include the  following reported medications:   Acetaminophen (Tylenol)  Acetaminophen (Percocet)  Albuterol (Proair HFA)  Bempedoic Acid (Nexletol)  Betamethasone (Lotrisone)  Bupropion (Wellbutrin XL)  Buspirone (Buspar)  Clobetasol (Temovate)  Clotrimazole (Lotrisone)  Duloxetine (Cymbalta)  Esomeprazole (Nexium)  Ezetimibe (Zetia)  Fenofibrate (TriCor)  Finasteride (Proscar)  Fluticasone (Advair)  Hydrochlorothiazide (Hydrodiuril)  Magnesium  Multivitamin  Potassium (Klor-Con)  Pregabalin (Lyrica)  Salmeterol (Advair)  Sildenafil  Tamsulosin (Flomax)  Topical Diclofenac (Voltaren)  Vitamin D ==================================================================== For clinical consultation, please call [phone]. ====================================================================       ROS  Constitutional:  low back pain Gastrointestinal: No reported hemesis, hematochezia, vomiting, or acute GI distress Neurological:  LE radicular pain right  Medication Review  Albuterol Sulfate, Cholecalciferol, DULoxetine, acetaminophen, busPIRone, clobetasol ointment, clotrimazole-betamethasone, diclofenac sodium, esomeprazole, ezetimibe, fenofibrate, fluticasone-salmeterol, hydrochlorothiazide, metoprolol tartrate, multivitamin, oxyCODONE-acetaminophen, potassium chloride, potassium chloride SA, pregabalin, sildenafil, and tamsulosin  History Review  Allergy: [name] is allergic to niacin-lovastatin er, atorvastatin, other, simvastatin, and propoxyphene. Drug: [name]  reports no history of drug use. Alcohol:  reports no history of alcohol use. Tobacco:  reports that he quit smoking about 10 years ago. His smoking use included cigarettes. He has a 67.50 pack-year smoking history. He has never used smokeless  tobacco. Social: [name]  reports that he quit smoking about 10 years ago. His smoking use included cigarettes. He has a 67.50 pack-year smoking history. He has never used smokeless tobacco. He reports that he does not drink alcohol and does not use drugs. Medical:  has a past medical history of Anxiety, Asthma, Benign essential HTN (06/24/2015), BPH (benign prostatic hyperplasia), COPD (chronic obstructive pulmonary disease) ([place]), Coronary artery disease (02/16/2014), DDD (degenerative disc disease), cervical, Degenerative disc disease, cervical (01/22/2017), Depression, DJD (degenerative joint disease), Emphysema of lung ([place]), Heel pain (10/10/2017), Hyperglobulinemia, Lumbar degenerative disc disease (01/22/2017), Major depression in remission ([place]) (03/10/2014), Migraines, Mitral regurgitation, Mixed hyperlipidemia (02/16/2014), Neck pain (01/24/2017), OSA (obstructive sleep apnea) (07/05/2016), Osteoarthritis of knee (08/12/2014), Prostate cancer ([place]) (2019), RA (rheumatoid arthritis) ([place]), Spinal stenosis of lumbar region with neurogenic claudication (04/18/2016), Spondylosis without myelopathy or radiculopathy, lumbar region (01/22/2017), and Venous insufficiency of both lower extremities (11/07/2016). Surgical: [name]  has a past surgical history that includes Cholecystectomy; Knee surgery (Left); deviated septum repair; Cardiac catheterization; Colonoscopy; Nasal septum surgery; Colonoscopy with propofol (N/A, 01/09/2018); and Esophagogastroduodenoscopy (egd) with propofol (N/A, 01/09/2018). Family: family history includes Heart disease in his mother.  Laboratory  Chemistry Profile   Renal Lab Results  Component Value Date   BUN 25 (H) 06/12/2022   CREATININE 0.91 06/12/2022   GFRNONAA >60 06/12/2022     Hepatic No results found for: "AST", "ALT", "ALBUMIN", "ALKPHOS", "HCVAB", "AMYLASE", "LIPASE", "AMMONIA"   Electrolytes Lab Results  Component Value Date   NA 136 06/12/2022   K 4.2  06/12/2022   CL 103 06/12/2022   CALCIUM 9.5 06/12/2022     Bone Lab Results  Component Value Date   TESTOSTERONE <3 (L) 04/29/2018     Inflammation (CRP: Acute Phase) (ESR: Chronic Phase) No results found for: "CRP", "ESRSEDRATE", "LATICACIDVEN"     Note: Above Lab results reviewed.   Physical Exam  General appearance: Well nourished, well developed, and well hydrated. In no apparent acute distress Mental status: Alert, oriented x 3 (person, place, & time)       Respiratory: No evidence of acute respiratory distress Eyes: PERLA Vitals: BP 129/79 (BP Location: Right Arm, Patient Position: Sitting, Cuff Size: Normal)   Pulse 79   Temp (!) 97.1 F (36.2 C) (Temporal)   Ht '5\' 6"'$  (1.676 m)   Wt 214 lb (97.1 kg)   SpO2 97%   BMI 34.54 kg/m  BMI: Estimated body mass index is 34.54 kg/m as calculated from the following:   Height as of this encounter: '5\' 6"'$  (1.676 m).   Weight as of this encounter: 214 lb (97.1 kg). Ideal: Ideal body weight: 63.8 kg (140 lb 10.5 oz) Adjusted ideal body weight: 77.1 kg (169 lb 15.9 oz)    Lumbar Spine Area Exam  Skin & Axial Inspection: No masses, redness, or swelling Alignment: Symmetrical Functional ROM: Pain restricted ROM affecting both sides Stability: No instability detected Muscle Tone/Strength: Functionally intact. No obvious neuro-muscular anomalies detected. Sensory (Neurological): Dermatomal pain pattern   Lower Extremity Exam      Side: Right lower extremity   Side: Left lower extremity  Stability: No instability observed           Stability: No instability observed          Skin & Extremity Inspection: Skin color, temperature, and hair growth are WNL. No peripheral edema or cyanosis. No masses, redness, swelling, asymmetry, or associated skin lesions. No contractures.   Skin & Extremity Inspection: Skin color, temperature, and hair growth are WNL. No peripheral edema or cyanosis. No masses, redness, swelling, asymmetry, or  associated skin lesions. No contractures.  Functional ROM: Pain restricted ROM for hip and knee joints           Functional ROM: Pain restricted ROM for hip and knee joints          Muscle Tone/Strength: Functionally intact. No obvious neuro-muscular anomalies detected.   Muscle Tone/Strength: Functionally intact. No obvious neuro-muscular anomalies detected.  Sensory (Neurological): Dermatomal pain pattern + SLR        Sensory (Neurological): Dermatomal pain pattern, +SLR   DTR: Patellar: deferred today Achilles: deferred today Plantar: deferred today   DTR: Patellar: deferred today Achilles: deferred today Plantar: deferred today  Palpation: No palpable anomalies   Palpation: No palpable anomalies    5 out of 5 strength bilateral lower extremity: Plantar flexion, dorsiflexion, knee flexion, knee extension.  Assessment   Status Diagnosis  Controlled Controlled Controlled 1. Chronic radicular lumbar pain   2. Spinal stenosis of lumbar region with neurogenic claudication   3. Neuroforaminal stenosis of lumbar spine   4. Lumbar spondylosis   5. OSA (obstructive sleep apnea)  6. Chronic pain syndrome        Plan of Care   [name] has a current medication list which includes the following long-term medication(s): albuterol sulfate, duloxetine, esomeprazole, ezetimibe, fenofibrate, hydrochlorothiazide, potassium chloride, fluticasone-salmeterol, metoprolol tartrate, and pregabalin.  Pharmacotherapy (Medications Ordered): Meds ordered this encounter  Medications   oxyCODONE-acetaminophen (PERCOCET) 10-325 MG tablet    Sig: Take 1 tablet by mouth every 8 (eight) hours as needed for pain. Must last 30 days.    Dispense:  90 tablet    Refill:  0    Chronic Pain: STOP Act (Not applicable) Fill 1 day early if closed on refill date. Avoid benzodiazepines within 8 hours of opioids   oxyCODONE-acetaminophen (PERCOCET) 10-325 MG tablet    Sig: Take 1 tablet by mouth  every 8 (eight) hours as needed for pain. Must last 30 days.    Dispense:  90 tablet    Refill:  0    Chronic Pain: STOP Act (Not applicable) Fill 1 day early if closed on refill date. Avoid benzodiazepines within 8 hours of opioids   oxyCODONE-acetaminophen (PERCOCET) 10-325 MG tablet    Sig: Take 1 tablet by mouth every 8 (eight) hours as needed for pain. Must last 30 days.    Dispense:  90 tablet    Refill:  0    Chronic Pain: STOP Act (Not applicable) Fill 1 day early if closed on refill date. Avoid benzodiazepines within 8 hours of opioids   pregabalin (LYRICA) 75 MG capsule    Sig: Take 1 capsule (75 mg total) by mouth at bedtime as needed.    Dispense:  90 capsule    Refill:  2    Fill one day early if pharmacy is closed on scheduled refill date. May substitute for generic if available.   Continue Cymbalta as prescribed  Follow-up plan:   Return in about 3 months (around 09/22/2022) for Medication Management, in person.    Recent Visits Date Type Provider Dept  03/28/22 Office Visit Gillis Santa, MD [facility]  Showing recent visits within past 90 days and meeting all other requirements Today's Visits Date Type Provider Dept  06/22/22 Office Visit Gillis Santa, MD [facility]  Showing today's visits and meeting all other requirements Future Appointments Date Type Provider Dept  09/19/22 Appointment Gillis Santa, MD [facility]  Showing future appointments within next 90 days and meeting all other requirements  I discussed the assessment and treatment plan with the patient. The patient was provided an opportunity to ask questions and all were answered. The patient agreed with the plan and demonstrated an understanding of the instructions.  Patient advised to call back or seek an in-person evaluation if the symptoms or condition worsens.  Duration of encounter:30 minutes.  Note by: Gillis Santa, MD Date: 06/22/2022; Time: 12:55 PM

## 2022-07-20 ENCOUNTER — Inpatient Hospital Stay: Payer: Medicare HMO | Attending: Radiation Oncology

## 2022-07-20 DIAGNOSIS — Z923 Personal history of irradiation: Secondary | ICD-10-CM | POA: Diagnosis not present

## 2022-07-20 DIAGNOSIS — C61 Malignant neoplasm of prostate: Secondary | ICD-10-CM | POA: Insufficient documentation

## 2022-07-20 LAB — PSA: Prostatic Specific Antigen: <0.01 ng/mL (ref 0.00–4.00)

## 2022-07-21 ENCOUNTER — Telehealth (HOSPITAL_COMMUNITY): Payer: Self-pay | Admitting: *Deleted

## 2022-07-21 NOTE — Telephone Encounter (Signed)
Reaching out to patient to offer assistance regarding upcoming cardiac imaging study; pt verbalizes understanding of appt date/time, parking situation and where to check in, pre-test NPO status and medications ordered, and verified current allergies; name and call back number provided for further questions should they arise  Onita Pfluger RN Navigator Cardiac Imaging [facility] [phone] office [phone] cell  Patient to take 100mg metoprolol tartrate and 10mg ivabradine two hours prior to his cardiac CT scan. 

## 2022-07-24 ENCOUNTER — Ambulatory Visit
Admission: RE | Admit: 2022-07-24 | Discharge: 2022-07-24 | Disposition: A | Discharge status: Home / Self Care | Payer: Medicare HMO | Source: Ambulatory Visit | Attending: Cardiology | Admitting: Cardiology

## 2022-07-24 ENCOUNTER — Ambulatory Visit: Payer: Medicare HMO | Attending: Cardiology

## 2022-07-24 DIAGNOSIS — I25118 Atherosclerotic heart disease of native coronary artery with other forms of angina pectoris: Secondary | ICD-10-CM | POA: Diagnosis not present

## 2022-07-24 DIAGNOSIS — R072 Precordial pain: Secondary | ICD-10-CM | POA: Diagnosis not present

## 2022-07-24 DIAGNOSIS — R079 Chest pain, unspecified: Secondary | ICD-10-CM | POA: Insufficient documentation

## 2022-07-24 MED ORDER — NITROGLYCERIN 0.4 MG SL SUBL: 0.8000 mg | SUBLINGUAL_TABLET | Freq: Once | SUBLINGUAL | Status: DC

## 2022-07-24 MED ORDER — NITROGLYCERIN 0.4 MG SL SUBL
0.4000 mg | SUBLINGUAL_TABLET | SUBLINGUAL | Status: DC | PRN
  Administered 2022-07-24: 0.4 mg via SUBLINGUAL

## 2022-07-24 MED ORDER — IOHEXOL 350 MG/ML SOLN
100.0000 mL | Freq: Once | INTRAVENOUS | Status: AC | PRN
  Administered 2022-07-24: 100 mL via INTRAVENOUS

## 2022-07-24 NOTE — Progress Notes (Signed)
Patient tolerated procedure well. Ambulate w/o difficulty. Denies light headedness or being dizzy. Encouraged to drink extra water today and reasoning explained. Verbalized understanding. All questions answered. ABC intact. No further needs. Discharge from procedure area w/o issues.   

## 2022-07-26 LAB — ECHOCARDIOGRAM COMPLETE
AR max vel: 2.49 cm2
AV Area VTI: 2.29 cm2
AV Area mean vel: 2.4 cm2
AV Mean grad: 6.5 mmHg
AV Peak grad: 12.3 mmHg
Ao pk vel: 1.75 m/s
Area-P 1/2: 3.08 cm2
Calc EF: 53 %
S' Lateral: 4.6 cm
Single Plane A2C EF: 49.3 %
Single Plane A4C EF: 55.2 %

## 2022-07-27 ENCOUNTER — Ambulatory Visit
Admission: RE | Admit: 2022-07-27 | Discharge: 2022-07-27 | Disposition: A | Discharge status: Home / Self Care | Payer: Medicare HMO | Source: Ambulatory Visit | Attending: Radiation Oncology | Admitting: Radiation Oncology

## 2022-07-27 ENCOUNTER — Encounter: Payer: Self-pay | Admitting: Radiation Oncology

## 2022-07-27 VITALS — BP 137/84 | HR 78 | Temp 98.0°F | Resp 18 | Ht 66.0 in | Wt 214.2 lb

## 2022-07-27 DIAGNOSIS — C61 Malignant neoplasm of prostate: Secondary | ICD-10-CM | POA: Insufficient documentation

## 2022-07-27 DIAGNOSIS — Z923 Personal history of irradiation: Secondary | ICD-10-CM | POA: Diagnosis not present

## 2022-07-27 NOTE — Progress Notes (Signed)
Radiation Oncology Follow up Note  Name: Gary [name]   Date:   07/27/2022 MRN:  [id number] DOB: [birth year]    This 72 y.o. male presents to the clinic today for 4-year follow-up status post IMRT radiation therapy for stage IIb Gleason 7 (4+3) adenocarcinoma the prostate.  REFERRING PROVIDER: Lauro Regulus, MD  HPI: Patient is a 72 year old male now out 4 years having completed IMRT radiation therapy to his prostate for Gleason 7 adenocarcinoma seen today in routine follow-up he is doing well specifically denies any increased lower urinary tract symptoms diarrhea or fatigue.  He does have some back pain which has been constant..  Over time.  His most recent PSA is less than 0.01  COMPLICATIONS OF TREATMENT: none  FOLLOW UP COMPLIANCE: keeps appointments   PHYSICAL EXAM:  BP 137/84   Pulse 78   Temp 98 F (36.7 C)   Resp 18   Ht 5\' 6"  (1.676 m)   Wt 214 lb 3.2 oz (97.2 kg)   BMI 34.57 kg/m  Well-developed well-nourished patient in NAD. HEENT reveals PERLA, EOMI, discs not visualized.  Oral cavity is clear. No oral mucosal lesions are identified. Neck is clear without evidence of cervical or supraclavicular adenopathy. Lungs are clear to A&P. Cardiac examination is essentially unremarkable with regular rate and rhythm without murmur rub or thrill. Abdomen is benign with no organomegaly or masses noted. Motor sensory and DTR levels are equal and symmetric in the upper and lower extremities. Cranial nerves II through XII are grossly intact. Proprioception is intact. No peripheral adenopathy or edema is identified. No motor or sensory levels are noted. Crude visual fields are within normal range.  RADIOLOGY RESULTS: No current films for review  PLAN: Present time patient is doing well under excellent biochemical control of his prostate cancer now out 4 years.  I am going to discontinue follow-up care and like his PMD to run a PSA once a year I be happy to see him again should  that be indicated.  Patient knows to call with any concerns.  I would like to take this opportunity to thank you for allowing me to participate in the care of your patient.Carmina Miller, MD

## 2022-07-28 ENCOUNTER — Encounter: Payer: Self-pay | Admitting: Cardiology

## 2022-07-28 ENCOUNTER — Ambulatory Visit: Payer: Medicare HMO | Attending: Cardiology | Admitting: Cardiology

## 2022-07-28 VITALS — BP 130/84 | HR 76 | Ht 66.5 in | Wt 212.8 lb

## 2022-07-28 DIAGNOSIS — E782 Mixed hyperlipidemia: Secondary | ICD-10-CM

## 2022-07-28 DIAGNOSIS — I25118 Atherosclerotic heart disease of native coronary artery with other forms of angina pectoris: Secondary | ICD-10-CM | POA: Diagnosis not present

## 2022-07-28 DIAGNOSIS — I10 Essential (primary) hypertension: Secondary | ICD-10-CM

## 2022-07-28 NOTE — Patient Instructions (Signed)
Medication Instructions:   Your physician recommends that you continue on your current medications as directed. Please refer to the Current Medication list given to you today.  *If you need a refill on your cardiac medications before your next appointment, please call your pharmacy*   Lab Work:  None Ordered  If you have labs (blood work) drawn today and your tests are completely normal, you will receive your results only by: MyChart Message (if you have MyChart) OR A paper copy in the mail If you have any lab test that is abnormal or we need to change your treatment, we will call you to review the results.   Testing/Procedures:  None Ordered    Follow-Up: At [facility], you and your health needs are our priority.  As part of our continuing mission to provide you with exceptional heart care, we have created designated Provider Care Teams.  These Care Teams include your primary Cardiologist (physician) and Advanced Practice Providers (APPs -  Physician Assistants and Nurse Practitioners) who all work together to provide you with the care you need, when you need it.  We recommend signing up for the patient portal called "MyChart".  Sign up information is provided on this After Visit Summary.  MyChart is used to connect with patients for Virtual Visits (Telemedicine).  Patients are able to view lab/test results, encounter notes, upcoming appointments, etc.  Non-urgent messages can be sent to your provider as well.   To learn more about what you can do with MyChart, go to [email or web address].    Your next appointment:   1 year(s)  Provider:   You may see Debbe Odea, MD or one of the following Advanced Practice Providers on your designated Care Team:   Nicolasa Ducking, NP Eula Listen, PA-C Cadence Fransico Michael, PA-C Charlsie Quest, NP

## 2022-07-28 NOTE — Progress Notes (Signed)
Cardiology Office Note:    Date:  07/28/2022   ID:  CASIMIRO LIENHARD, DOB [birth year], MRN [id number]  PCP:  Lauro Regulus, MD   [facility] Providers Cardiologist:  Debbe Odea, MD     Referring MD: Lauro Regulus, MD   Chief Complaint  Patient presents with   Follow-up    Discuss test results.  Patient having mid chest "uneasy" feeling with 10 episodes for past 2 weeks.    History of Present Illness:    Gary [name] is a 72 y.o. male with a hx of hyperlipidemia, hypertension, former smoker x 40+ years who presents for follow-up.  Last seen due to chest pain.  Echo and coronary CT was obtained to evaluate any cardiac etiology.  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  Endorse needing to lift her up very frequently which might have caused some muscle strain.  Otherwise he states feeling well, has some point easy feeling on his right chest.  Prior notes/testing Lexiscan Myoview 2019 showed no ischemia, echo 2019 EF 55%.  CT chest lung cancer screening 6/23 showed left circumflex calcifications.   Patient is allergic to statins  Past Medical History:  Diagnosis Date   Anxiety    Asthma    Benign essential HTN 06/24/2015   Last Assessment & Plan:  Taking medications without noted side effects or dizziness.   Overview:  Last Assessment & Plan:  Is compliant with hypertensive medications without clear side effects or lack of control.     BPH (benign prostatic hyperplasia)    COPD (chronic obstructive pulmonary disease)    Coronary artery disease 02/16/2014   Overview:  Minimal disease by cath 12/13  Last Assessment & Plan:  Seems to be tolerating medical regimen without significant side effects and symptoms such as worsening chest pain are not noted.   Overview:  Overview:  Minimal disease by cath 12/13  Last Assessment & Plan:  Seems to be tolerating medical regimen without significant side effects and symptoms such as worsening  chest pain are not no   DDD (degenerative disc disease), cervical    Degenerative disc disease, cervical 01/22/2017   Depression    DJD (degenerative joint disease)    Emphysema of lung    Heel pain 10/10/2017   Hyperglobulinemia    Lumbar degenerative disc disease 01/22/2017   Major depression in remission 03/10/2014   Last Assessment & Plan:  Mood is doing well on meds Overview:  Last Assessment & Plan:  Mood is doing well on meds    Migraines    Mitral regurgitation    Mixed hyperlipidemia 02/16/2014   Last Assessment & Plan:  Diet for healthy cholesterol is being attempted and no clear myalgia's or other side effects are noted.   Overview:  Last Assessment & Plan:  Low fat diet is being attempted and no significant side effects such as myalgia's are noted.     Neck pain 01/24/2017   OSA (obstructive sleep apnea) 07/05/2016   Last Assessment & Plan:  Continues to use cpap consistently and is continuing to benefit from it's use.     Osteoarthritis of knee 08/12/2014   Overview:  Right knee is worse now  Last Assessment & Plan:  Diffuse pain on back in hips and knees also. Went to a pain clinic  Overview:  Overview:  Right knee is worse now  Last Assessment & Plan:  Diffuse pain on back in hips and knees  also. Went to a pain clinic    Prostate cancer 2019   Rad Tx's.    RA (rheumatoid arthritis)    Spinal stenosis of lumbar region with neurogenic claudication 04/18/2016   Spondylosis without myelopathy or radiculopathy, lumbar region 01/22/2017   Venous insufficiency of both lower extremities 11/07/2016    Past Surgical History:  Procedure Laterality Date   CARDIAC CATHETERIZATION     CHOLECYSTECTOMY     COLONOSCOPY     COLONOSCOPY WITH PROPOFOL N/A 01/09/2018   Procedure: COLONOSCOPY WITH PROPOFOL;  Surgeon: Toledo, Boykin Nearing, MD;  Location: [facility] ENDOSCOPY;  Service: Gastroenterology;  Laterality: N/A;   deviated septum repair     ESOPHAGOGASTRODUODENOSCOPY (EGD) WITH PROPOFOL N/A  01/09/2018   Procedure: ESOPHAGOGASTRODUODENOSCOPY (EGD) WITH PROPOFOL;  Surgeon: Toledo, Boykin Nearing, MD;  Location: [facility] ENDOSCOPY;  Service: Gastroenterology;  Laterality: N/A;   KNEE SURGERY Left    NASAL SEPTUM SURGERY      Current Medications: Current Meds  Medication Sig   acetaminophen (TYLENOL) 500 MG tablet Take as needed by mouth.    Albuterol Sulfate 108 (90 Base) MCG/ACT AEPB Inhale as needed into the lungs.    busPIRone (BUSPAR) 15 MG tablet Take 15 mg by mouth 2 (two) times a day.   cholecalciferol (VITAMIN D) 1000 units tablet Take by mouth.   clobetasol ointment (TEMOVATE) 0.05 % Apply 1 application topically 2 (two) times daily.   clotrimazole-betamethasone (LOTRISONE) cream Apply 1 application topically 2 (two) times daily.   diclofenac sodium (VOLTAREN) 1 % GEL Apply topically 4 (four) times daily.   DULoxetine (CYMBALTA) 60 MG capsule Take 1 capsule (60 mg total) by mouth daily.   esomeprazole (NEXIUM) 40 MG capsule Take 40 mg by mouth daily.   ezetimibe (ZETIA) 10 MG tablet Take 10 mg by mouth daily.    fenofibrate (TRICOR) 48 MG tablet Take 48 mg by mouth daily.   hydrochlorothiazide (HYDRODIURIL) 25 MG tablet    Multiple Vitamin (MULTIVITAMIN) tablet Take 1 tablet by mouth daily.   oxyCODONE-acetaminophen (PERCOCET) 10-325 MG tablet Take 1 tablet by mouth every 8 (eight) hours as needed for pain. Must last 30 days.   [START ON 08/21/2022] oxyCODONE-acetaminophen (PERCOCET) 10-325 MG tablet Take 1 tablet by mouth every 8 (eight) hours as needed for pain. Must last 30 days.   potassium chloride (KLOR-CON) 10 MEQ tablet Take 1 tablet by mouth daily.   potassium chloride SA (KLOR-CON M) 20 MEQ tablet Take 20 mEq by mouth daily.   pregabalin (LYRICA) 75 MG capsule Take 1 capsule (75 mg total) by mouth at bedtime as needed.   tamsulosin (FLOMAX) 0.4 MG CAPS capsule TAKE 1 CAPSULE EVERY DAY AFTER SUPPER   Current Facility-Administered Medications for the 07/28/22 encounter  (Office Visit) with Debbe Odea, MD  Medication   leuprolide (6 Month) (ELIGARD) injection 45 mg     Allergies:   Niacin-lovastatin er, Atorvastatin, Other, Simvastatin, and Propoxyphene   Social History   Socioeconomic History   Marital status: Married    Spouse name: Not on file   Number of children: Not on file   Years of education: Not on file   Highest education level: Not on file  Occupational History   Not on file  Tobacco Use   Smoking status: Former    Packs/day: 1.50    Years: 45.00    Additional pack years: 0.00    Total pack years: 67.50    Types: Cigarettes    Quit date: 2014  Years since quitting: 10.3   Smokeless tobacco: Never  Vaping Use   Vaping Use: Never used  Substance and Sexual Activity   Alcohol use: No   Drug use: No   Sexual activity: Not on file  Other Topics Concern   Not on file  Social History Narrative   Not on file   Social Determinants of Health   Financial Resource Strain: Not on file  Food Insecurity: Not on file  Transportation Needs: Not on file  Physical Activity: Not on file  Stress: Not on file  Social Connections: Not on file     Family History: The patient's family history includes Heart disease in his mother.  ROS:   Please see the history of present illness.     All other systems reviewed and are negative.  EKGs/Labs/Other Studies Reviewed:    The following studies were reviewed today:   EKG:  EKG is  ordered today.  The ekg ordered today demonstrates normal sinus rhythm, right bundle branch block  Recent Labs: 06/12/2022: BUN 25; Creatinine, Ser 0.91; Hemoglobin 14.3; Platelets 238; Potassium 4.2; Sodium 136  Recent Lipid Panel No results found for: "CHOL", "TRIG", "HDL", "CHOLHDL", "VLDL", "LDLCALC", "LDLDIRECT"   Risk Assessment/Calculations:             Physical Exam:    VS:  BP 130/84 (BP Location: Left Arm, Patient Position: Sitting, Cuff Size: Normal)   Pulse 76   Ht 5' 6.5" (1.689  m)   Wt 212 lb 12.8 oz (96.5 kg)   SpO2 97%   BMI 33.83 kg/m     Wt Readings from Last 3 Encounters:  07/28/22 212 lb 12.8 oz (96.5 kg)  07/27/22 214 lb 3.2 oz (97.2 kg)  06/22/22 214 lb (97.1 kg)     GEN:  Well nourished, well developed in no acute distress HEENT: Normal NECK: No JVD; No carotid bruits CARDIAC: RRR, no murmurs, rubs, gallops RESPIRATORY:  Clear to auscultation without rales, wheezing or rhonchi  ABDOMEN: Soft, non-tender, non-distended MUSCULOSKELETAL:  No edema; right chest wall tenderness SKIN: Warm and dry NEUROLOGIC:  Alert and oriented x 3 PSYCHIATRIC:  Normal affect   ASSESSMENT:    1. Coronary artery disease of native artery of native heart with stable angina pectoris   2. Primary hypertension   3. Mixed hyperlipidemia    PLAN:    In order of problems listed above:  CAD, coronary CTA with calcium score 32.3, minimal proximal RCA and left circumflex disease less than 25%.  EF 55 to 60% continue aspirin 81 mg daily, Zetia, bempedoic acid.  Right chest wall tenderness noted, consistent with musculoskeletal etiology. Hypertension, BP controlled.  Continue HCTZ 25 mg daily. Hyperlipidemia, continue Zetia, bempedoic acid.  Plans to repeat lipid panel with PCP.  Follow-up in 1 year.     Medication Adjustments/Labs and Tests Ordered: Current medicines are reviewed at length with the patient today.  Concerns regarding medicines are outlined above.  Orders Placed This Encounter  Procedures   EKG 12-Lead   No orders of the defined types were placed in this encounter.   Patient Instructions  Medication Instructions:   Your physician recommends that you continue on your current medications as directed. Please refer to the Current Medication list given to you today.  *If you need a refill on your cardiac medications before your next appointment, please call your pharmacy*   Lab Work:  None Ordered  If you have labs (blood work) drawn today and  your tests  are completely normal, you will receive your results only by: MyChart Message (if you have MyChart) OR A paper copy in the mail If you have any lab test that is abnormal or we need to change your treatment, we will call you to review the results.   Testing/Procedures:  None Ordered    Follow-Up: At [facility], you and your health needs are our priority.  As part of our continuing mission to provide you with exceptional heart care, we have created designated Provider Care Teams.  These Care Teams include your primary Cardiologist (physician) and Advanced Practice Providers (APPs -  Physician Assistants and Nurse Practitioners) who all work together to provide you with the care you need, when you need it.  We recommend signing up for the patient portal called "MyChart".  Sign up information is provided on this After Visit Summary.  MyChart is used to connect with patients for Virtual Visits (Telemedicine).  Patients are able to view lab/test results, encounter notes, upcoming appointments, etc.  Non-urgent messages can be sent to your provider as well.   To learn more about what you can do with MyChart, go to [email or web address].    Your next appointment:   1 year(s)  Provider:   You may see Debbe Odea, MD or one of the following Advanced Practice Providers on your designated Care Team:   Nicolasa Ducking, NP Eula Listen, PA-C Cadence Fransico Michael, PA-C Charlsie Quest, NP    Signed, Debbe Odea, MD  07/28/2022 12:27 PM    [facility]

## 2022-09-03 ENCOUNTER — Other Ambulatory Visit: Payer: Self-pay | Admitting: Acute Care

## 2022-09-03 DIAGNOSIS — Z87891 Personal history of nicotine dependence: Secondary | ICD-10-CM

## 2022-09-03 DIAGNOSIS — Z122 Encounter for screening for malignant neoplasm of respiratory organs: Secondary | ICD-10-CM

## 2022-09-19 ENCOUNTER — Encounter: Payer: Medicare HMO | Admitting: Student in an Organized Health Care Education/Training Program

## 2022-09-21 ENCOUNTER — Ambulatory Visit
Payer: Medicare HMO | Attending: Student in an Organized Health Care Education/Training Program | Admitting: Student in an Organized Health Care Education/Training Program

## 2022-09-21 ENCOUNTER — Encounter: Payer: Self-pay | Admitting: Student in an Organized Health Care Education/Training Program

## 2022-09-21 VITALS — BP 132/79 | HR 80 | Temp 97.8°F | Resp 18 | Ht 67.0 in | Wt 215.0 lb

## 2022-09-21 DIAGNOSIS — M47816 Spondylosis without myelopathy or radiculopathy, lumbar region: Secondary | ICD-10-CM | POA: Diagnosis not present

## 2022-09-21 DIAGNOSIS — M5416 Radiculopathy, lumbar region: Secondary | ICD-10-CM

## 2022-09-21 DIAGNOSIS — M48061 Spinal stenosis, lumbar region without neurogenic claudication: Secondary | ICD-10-CM | POA: Insufficient documentation

## 2022-09-21 DIAGNOSIS — M48062 Spinal stenosis, lumbar region with neurogenic claudication: Secondary | ICD-10-CM | POA: Diagnosis not present

## 2022-09-21 DIAGNOSIS — G894 Chronic pain syndrome: Secondary | ICD-10-CM

## 2022-09-21 DIAGNOSIS — G8929 Other chronic pain: Secondary | ICD-10-CM | POA: Diagnosis present

## 2022-09-21 MED ORDER — OXYCODONE-ACETAMINOPHEN 10-325 MG PO TABS
1.0000 | ORAL_TABLET | Freq: Three times a day (TID) | ORAL | 0 refills | Status: AC | PRN
Start: 2022-10-23 — End: 2022-11-22

## 2022-09-21 MED ORDER — OXYCODONE-ACETAMINOPHEN 10-325 MG PO TABS
1.0000 | ORAL_TABLET | Freq: Three times a day (TID) | ORAL | 0 refills | Status: DC | PRN
Start: 2022-11-22 — End: 2022-12-14

## 2022-09-21 MED ORDER — OXYCODONE-ACETAMINOPHEN 10-325 MG PO TABS
1.0000 | ORAL_TABLET | Freq: Three times a day (TID) | ORAL | 0 refills | Status: AC | PRN
Start: 2022-09-23 — End: 2022-10-23

## 2022-09-21 NOTE — Progress Notes (Signed)
PROVIDER NOTE: Information contained herein reflects review and annotations entered in association with encounter. Interpretation of such information and data should be left to medically-trained personnel. Information provided to patient can be located elsewhere in the medical record under "Patient Instructions". Document created using STT-dictation technology, any transcriptional errors that may result from process are unintentional.    Patient: Gary [name]  Service Category: E/M  Provider: Edward Jolly, [name]  DOB: 1950/04/24  DOS: 09/21/2022  Specialty: Interventional Pain Management  MRN: [id number]  Setting: Ambulatory outpatient  PCP: Gary [name]  Type: Established Patient    Referring Provider: Lauro Regulus, [name]  Location: Office  Delivery: Face-to-face     HPI  Gary [name], a 72 y.o. year old male, is here today because of his Chronic radicular lumbar pain [M54.16, G89.29]. [name] primary complain today is Back Pain  Last encounter: My last encounter with him was on 06/22/2022  Pertinent problems: [name] Lumbar spondylosis; Lumbar degenerative disc disease; Degenerative disc disease, cervical; Chronic pain syndrome; Facet arthropathy, lumbar; Spinal stenosis of lumbar region with neurogenic claudication; Chronic bilateral low back pain with left-sided sciatica; Chronic left sacroiliac joint pain; Chronic hip pain, left; Cervical spondylosis with radiculopathy; Groin pain, chronic, right; and Chronic left shoulder pain on their pertinent problem list. Pain Assessment: Severity of Chronic pain is reported as a 7 /10. Location: Back Lower/sometimes into hips and legs to shins bilat. Onset: More than a month ago. Quality: Stabbing. Timing: Constant. Modifying factor(s): meds, epidurals. Vitals:  height is 5\' 7"  (1.702 m) and weight is 215 lb (97.5 kg). His temporal temperature is 97.8 F (36.6 C). His blood pressure is 132/79 and his pulse is 80. His  respiration is 18 and oxygen saturation is 94%.   Reason for encounter: medication management.    No change in medical history since last visit.  Patient's pain is at baseline.  Patient continues multimodal pain regimen as prescribed.  States that it provides pain relief and improvement in functional status.   Pharmacotherapy Assessment  Analgesic:  Percocet 10 mg TID prn, #90/month, MME=45   Monitoring:  PMP: PDMP reviewed during this encounter.       Pharmacotherapy: No side-effects or adverse reactions reported. Compliance: No problems identified. Effectiveness: Clinically acceptable.  UDS:  Summary  Date Value Ref Range Status  10/06/2021 Note  Final    Comment:    ==================================================================== [employer] Select 13 (MW) ==================================================================== Test                             Result       Flag       Units  Drug Present and Declared for Prescription Verification   Oxycodone                      1838         EXPECTED   ng/mg creat   Oxymorphone                    749          EXPECTED   ng/mg creat   Noroxycodone                   1370         EXPECTED   ng/mg creat   Noroxymorphone  317          EXPECTED   ng/mg creat    Sources of oxycodone are scheduled prescription medications.    Oxymorphone, noroxycodone, and noroxymorphone are expected    metabolites of oxycodone. Oxymorphone is also available as a    scheduled prescription medication.  ==================================================================== Test                      Result    Flag   Units      Ref Range   Creatinine              146              mg/dL      >=16 ==================================================================== Declared Medications:  The flagging and interpretation on this report are based on the  following declared medications.  Unexpected results may arise from  inaccuracies in the  declared medications.   **Note: The testing scope of this panel includes these medications:   Oxycodone (Percocet)   **Note: The testing scope of this panel does not include the  following reported medications:   Acetaminophen (Tylenol)  Acetaminophen (Percocet)  Albuterol (Proair HFA)  Bempedoic Acid (Nexletol)  Betamethasone (Lotrisone)  Bupropion (Wellbutrin XL)  Buspirone (Buspar)  Clobetasol (Temovate)  Clotrimazole (Lotrisone)  Duloxetine (Cymbalta)  Esomeprazole (Nexium)  Ezetimibe (Zetia)  Fenofibrate (TriCor)  Finasteride (Proscar)  Fluticasone (Advair)  Hydrochlorothiazide (Hydrodiuril)  Magnesium  Multivitamin  Potassium (Klor-Con)  Pregabalin (Lyrica)  Salmeterol (Advair)  Sildenafil  Tamsulosin (Flomax)  Topical Diclofenac (Voltaren)  Vitamin D ==================================================================== For clinical consultation, please call [phone]. ====================================================================       ROS  Constitutional:  low back pain Gastrointestinal: No reported hemesis, hematochezia, vomiting, or acute GI distress Neurological:  LE radicular pain right  Medication Review  Albuterol Sulfate, DULoxetine, acetaminophen, busPIRone, cholecalciferol, clobetasol ointment, clotrimazole-betamethasone, diclofenac sodium, esomeprazole, ezetimibe, fenofibrate, fluticasone-salmeterol, hydrochlorothiazide, multivitamin, oxyCODONE-acetaminophen, potassium chloride, potassium chloride SA, pregabalin, sildenafil, and tamsulosin  History Review  Allergy: Gary [name] is allergic to niacin-lovastatin er, atorvastatin, other, simvastatin, and propoxyphene. Drug: Gary [name]  reports no history of drug use. Alcohol:  reports no history of alcohol use. Tobacco:  reports that he quit smoking about 10 years ago. His smoking use included cigarettes. He [name] a 67.50 pack-year smoking history. He [name] never used smokeless  tobacco. Social: Gary [name]  reports that he quit smoking about 10 years ago. His smoking use included cigarettes. He [name] a 67.50 pack-year smoking history. He [name] never used smokeless tobacco. He reports that he does not drink alcohol and does not use drugs. Medical:  [name] a past medical history of Anxiety, Asthma, Benign essential HTN (06/24/2015), BPH (benign prostatic hyperplasia), COPD (chronic obstructive pulmonary disease) (HCC), Coronary artery disease (02/16/2014), DDD (degenerative disc disease), cervical, Degenerative disc disease, cervical (01/22/2017), Depression, DJD (degenerative joint disease), Emphysema of lung (HCC), Heel pain (10/10/2017), Hyperglobulinemia, Lumbar degenerative disc disease (01/22/2017), Major depression in remission (HCC) (03/10/2014), Migraines, Mitral regurgitation, Mixed hyperlipidemia (02/16/2014), Neck pain (01/24/2017), OSA (obstructive sleep apnea) (07/05/2016), Osteoarthritis of knee (08/12/2014), Prostate cancer (HCC) (2019), RA (rheumatoid arthritis) (HCC), Spinal stenosis of lumbar region with neurogenic claudication (04/18/2016), Spondylosis without myelopathy or radiculopathy, lumbar region (01/22/2017), and Venous insufficiency of both lower extremities (11/07/2016). Surgical: Mr. Talarico  [name] a past surgical history that includes Cholecystectomy; Knee surgery (Left); deviated septum repair; Cardiac catheterization; Colonoscopy; Nasal septum surgery; Colonoscopy with propofol (N/A, 01/09/2018); and Esophagogastroduodenoscopy (egd) with propofol (N/A, 01/09/2018). Family: family history  includes Heart disease in his mother.  Laboratory Chemistry Profile   Renal Lab Results  Component Value Date   BUN 25 (H) 06/12/2022   CREATININE 0.91 06/12/2022   GFRNONAA >60 06/12/2022     Hepatic No results found for: "AST", "ALT", "ALBUMIN", "ALKPHOS", "HCVAB", "AMYLASE", "LIPASE", "AMMONIA"   Electrolytes Lab Results  Component Value Date   NA 136 06/12/2022   K 4.2  06/12/2022   CL 103 06/12/2022   CALCIUM 9.5 06/12/2022     Bone Lab Results  Component Value Date   TESTOSTERONE <3 (L) 04/29/2018     Inflammation (CRP: Acute Phase) (ESR: Chronic Phase) No results found for: "CRP", "ESRSEDRATE", "LATICACIDVEN"     Note: Above Lab results reviewed.   Physical Exam  General appearance: Well nourished, well developed, and well hydrated. In no apparent acute distress Mental status: Alert, oriented x 3 (person, place, & time)       Respiratory: No evidence of acute respiratory distress Eyes: PERLA Vitals: BP 132/79   Pulse 80   Temp 97.8 F (36.6 C) (Temporal)   Resp 18   Ht 5\' 7"  (1.702 m)   Wt 215 lb (97.5 kg)   SpO2 94%   BMI 33.67 kg/m  BMI: Estimated body mass index is 33.67 kg/m as calculated from the following:   Height as of this encounter: 5\' 7"  (1.702 m).   Weight as of this encounter: 215 lb (97.5 kg). Ideal: Ideal body weight: 66.1 kg (145 lb 11.6 oz) Adjusted ideal body weight: 78.7 kg (173 lb 6.9 oz)    Lumbar Spine Area Exam  Skin & Axial Inspection: No masses, redness, or swelling Alignment: Symmetrical Functional ROM: Pain restricted ROM affecting both sides Stability: No instability detected Muscle Tone/Strength: Functionally intact. No obvious neuro-muscular anomalies detected. Sensory (Neurological): Dermatomal pain pattern   Lower Extremity Exam      Side: Right lower extremity   Side: Left lower extremity  Stability: No instability observed           Stability: No instability observed          Skin & Extremity Inspection: Skin color, temperature, and hair growth are WNL. No peripheral edema or cyanosis. No masses, redness, swelling, asymmetry, or associated skin lesions. No contractures.   Skin & Extremity Inspection: Skin color, temperature, and hair growth are WNL. No peripheral edema or cyanosis. No masses, redness, swelling, asymmetry, or associated skin lesions. No contractures.  Functional ROM: Pain  restricted ROM for hip and knee joints           Functional ROM: Pain restricted ROM for hip and knee joints          Muscle Tone/Strength: Functionally intact. No obvious neuro-muscular anomalies detected.   Muscle Tone/Strength: Functionally intact. No obvious neuro-muscular anomalies detected.  Sensory (Neurological): Dermatomal pain pattern + SLR        Sensory (Neurological): Dermatomal pain pattern, +SLR   DTR: Patellar: deferred today Achilles: deferred today Plantar: deferred today   DTR: Patellar: deferred today Achilles: deferred today Plantar: deferred today  Palpation: No palpable anomalies   Palpation: No palpable anomalies    5 out of 5 strength bilateral lower extremity: Plantar flexion, dorsiflexion, knee flexion, knee extension.  Assessment   Status Diagnosis  Controlled Controlled Controlled 1. Chronic radicular lumbar pain   2. Spinal stenosis of lumbar region with neurogenic claudication   3. Neuroforaminal stenosis of lumbar spine   4. Lumbar spondylosis   5. Chronic pain syndrome  Plan of Care   Mr. LEAH DENNEY [name] a current medication list which includes the following long-term medication(s): albuterol sulfate, duloxetine, esomeprazole, ezetimibe, fenofibrate, fluticasone-salmeterol, hydrochlorothiazide, potassium chloride, and pregabalin.  Pharmacotherapy (Medications Ordered): Meds ordered this encounter  Medications   oxyCODONE-acetaminophen (PERCOCET) 10-325 MG tablet    Sig: Take 1 tablet by mouth every 8 (eight) hours as needed for pain.    Dispense:  90 tablet    Refill:  0   oxyCODONE-acetaminophen (PERCOCET) 10-325 MG tablet    Sig: Take 1 tablet by mouth every 8 (eight) hours as needed for pain.    Dispense:  90 tablet    Refill:  0   oxyCODONE-acetaminophen (PERCOCET) 10-325 MG tablet    Sig: Take 1 tablet by mouth every 8 (eight) hours as needed for pain.    Dispense:  90 tablet    Refill:  0   Orders Placed This  Encounter  Procedures   [employer] Select 13 (MW), Urine    Volume: 30 ml(s). Minimum 3 ml of urine is needed. Document temperature of fresh sample. Indications: Long term (current) use of opiate analgesic (Z79.891)    Order Specific Question:   Release to patient    Answer:   Immediate   Continue Cymbalta and Lyrica as prescribed  Follow-up plan:   Return in about 3 months (around 12/22/2022) for Medication Management, in person.    Recent Visits No visits were found meeting these conditions. Showing recent visits within past 90 days and meeting all other requirements Today's Visits Date Type Provider Dept  09/21/22 Office Visit Gary Jolly, [name] [facility]  Showing today's visits and meeting all other requirements Future Appointments Date Type Provider Dept  12/14/22 Appointment Gary Jolly, [name] [facility]  Showing future appointments within next 90 days and meeting all other requirements  I discussed the assessment and treatment plan with the patient. The patient was provided an opportunity to ask questions and all were answered. The patient agreed with the plan and demonstrated an understanding of the instructions.  Patient advised to call back or seek an in-person evaluation if the symptoms or condition worsens.  Duration of encounter:30 minutes.  Note by: Gary Jolly, [name] Date: 09/21/2022; Time: 11:34 AM

## 2022-09-21 NOTE — Progress Notes (Signed)
Nursing Pain Medication Assessment:  Safety precautions to be maintained throughout the outpatient stay will include: orient to surroundings, keep bed in low position, maintain call bell within reach at all times, provide assistance with transfer out of bed and ambulation.  Medication Inspection Compliance: Pill count conducted under aseptic conditions, in front of the patient. Neither the pills nor the bottle was removed from the patient's sight at any time. Once count was completed pills were immediately returned to the patient in their original bottle.  Medication: Oxycodone/APAP Pill/Patch Count:  5 of 90 pills remain Pill/Patch Appearance: Markings consistent with prescribed medication Bottle Appearance: Standard pharmacy container. Clearly labeled. Filled Date: 5 / 61 / 2024 Last Medication intake:  Today

## 2022-09-23 LAB — TOXASSURE SELECT 13 (MW), URINE

## 2022-10-02 ENCOUNTER — Ambulatory Visit: Payer: Medicare HMO | Attending: Internal Medicine

## 2022-10-13 ENCOUNTER — Other Ambulatory Visit: Payer: Self-pay | Admitting: Student in an Organized Health Care Education/Training Program

## 2022-12-14 ENCOUNTER — Ambulatory Visit
Payer: Medicare HMO | Attending: Student in an Organized Health Care Education/Training Program | Admitting: Student in an Organized Health Care Education/Training Program

## 2022-12-14 ENCOUNTER — Encounter: Payer: Self-pay | Admitting: Student in an Organized Health Care Education/Training Program

## 2022-12-14 VITALS — BP 150/94 | HR 107 | Temp 97.4°F | Ht 68.0 in | Wt 215.0 lb

## 2022-12-14 DIAGNOSIS — G8929 Other chronic pain: Secondary | ICD-10-CM | POA: Insufficient documentation

## 2022-12-14 DIAGNOSIS — G894 Chronic pain syndrome: Secondary | ICD-10-CM | POA: Insufficient documentation

## 2022-12-14 DIAGNOSIS — M5416 Radiculopathy, lumbar region: Secondary | ICD-10-CM | POA: Diagnosis present

## 2022-12-14 DIAGNOSIS — M48062 Spinal stenosis, lumbar region with neurogenic claudication: Secondary | ICD-10-CM | POA: Insufficient documentation

## 2022-12-14 DIAGNOSIS — M79641 Pain in right hand: Secondary | ICD-10-CM | POA: Diagnosis present

## 2022-12-14 DIAGNOSIS — M47816 Spondylosis without myelopathy or radiculopathy, lumbar region: Secondary | ICD-10-CM | POA: Diagnosis present

## 2022-12-14 DIAGNOSIS — M4726 Other spondylosis with radiculopathy, lumbar region: Secondary | ICD-10-CM

## 2022-12-14 DIAGNOSIS — M48061 Spinal stenosis, lumbar region without neurogenic claudication: Secondary | ICD-10-CM | POA: Insufficient documentation

## 2022-12-14 MED ORDER — OXYCODONE-ACETAMINOPHEN 10-325 MG PO TABS: 1.0000 | ORAL_TABLET | Freq: Three times a day (TID) | ORAL | 0 refills | Status: AC | PRN

## 2022-12-14 MED ORDER — OXYCODONE-ACETAMINOPHEN 10-325 MG PO TABS: 1.0000 | ORAL_TABLET | Freq: Three times a day (TID) | ORAL | 0 refills | Status: DC | PRN

## 2022-12-14 MED ORDER — OXYCODONE-ACETAMINOPHEN 10-325 MG PO TABS
1.0000 | ORAL_TABLET | Freq: Three times a day (TID) | ORAL | 0 refills | Status: DC | PRN
Start: 2023-01-25 — End: 2022-12-26

## 2022-12-14 NOTE — Progress Notes (Signed)
Nursing Pain Medication Assessment:  Safety precautions to be maintained throughout the outpatient stay will include: orient to surroundings, keep bed in low position, maintain call bell within reach at all times, provide assistance with transfer out of bed and ambulation.  Medication Inspection Compliance: Pill count conducted under aseptic conditions, in front of the patient. Neither the pills nor the bottle was removed from the patient's sight at any time. Once count was completed pills were immediately returned to the patient in their original bottle.  Medication: Oxycodone/APAP Pill/Patch Count:  32 of 90 pills remain Pill/Patch Appearance: Markings consistent with prescribed medication Bottle Appearance: Standard pharmacy container. Clearly labeled. Filled Date: 8 / 17 / 2024 Last Medication intake:  TodaySafety precautions to be maintained throughout the outpatient stay will include: orient to surroundings, keep bed in low position, maintain call bell within reach at all times, provide assistance with transfer out of bed and ambulation.

## 2022-12-14 NOTE — Progress Notes (Signed)
PROVIDER NOTE: Information contained herein reflects review and annotations entered in association with encounter. Interpretation of such information and data should be left to medically-trained personnel. Information provided to patient can be located elsewhere in the medical record under "Patient Instructions". Document created using STT-dictation technology, any transcriptional errors that may result from process are unintentional.    Patient: Gary [name]  Service Category: E/M  Provider: Edward Jolly, MD  DOB: 1950/07/08  DOS: 12/14/2022  Specialty: Interventional Pain Management  MRN: [id number]  Setting: Ambulatory outpatient  PCP: Lauro Regulus, MD  Type: Established Patient    Referring Provider: Lauro Regulus, MD  Location: Office  Delivery: Face-to-face     HPI  [name], a 72 y.o. year old male, is here today because of his Chronic radicular lumbar pain [M54.16, G89.29]. Mr. Mansker primary complain today is Hip Pain (left)  Last encounter: My last encounter with him was on 09/21/22  Pertinent problems: Mr. Spurlock has Lumbar spondylosis; Lumbar degenerative disc disease; Degenerative disc disease, cervical; Chronic pain syndrome; Facet arthropathy, lumbar; Spinal stenosis of lumbar region with neurogenic claudication; Chronic bilateral low back pain with left-sided sciatica; Chronic left sacroiliac joint pain; Chronic hip pain, left; Cervical spondylosis with radiculopathy; Groin pain, chronic, right; and Chronic left shoulder pain on their pertinent problem list. Pain Assessment: Severity of Chronic pain is reported as a 7 /10. Location: Hip Left/pain radiaties down his left leg to his foot and right leg at times. Onset: More than a month ago. Quality: Aching, Burning, Constant, Throbbing, Stabbing, Shooting. Timing: Constant. Modifying factor(s): Meds and procedures. Vitals:  height is 5\' 8"  (1.727 m) and weight is 215 lb (97.5 kg). His temperature is 97.4  F (36.3 C) (abnormal). His blood pressure is 150/94 (abnormal) and his pulse is 107 (abnormal). His oxygen saturation is 98%.   Reason for encounter: medication management.    Patient presents today for medication management.  He is endorsing increased lumbar spine pain as well as left hip pain.  He is status post left hip steroid injection yesterday with sports medicine.  He states that he has having more pain with ambulation and is having pain that radiates down his left leg to his foot and occasionally down his right leg.  His previous MRI was over 5 years ago and shows multilevel lumbar foraminal stenosis, lumbar disc degeneration and facet arthropathy.  Given worsening of symptoms, I believe it would be helpful to update his lumbar MRI that we can discuss other options to help manage his pain since his medications have become less effective as well.  Future considerations could include spinal cord stimulation, peripheral nerve stimulation and even a referral to neurosurgery.  Pharmacotherapy Assessment  Analgesic:  Percocet 10 mg TID prn, #90/month, MME=45   Monitoring: [place] PMP: PDMP reviewed during this encounter.       Pharmacotherapy: No side-effects or adverse reactions reported. Compliance: No problems identified. Effectiveness: Clinically acceptable.  UDS:  Summary  Date Value Ref Range Status  09/21/2022 Note  Final    Comment:    ==================================================================== [facility] (MW) ==================================================================== Test                             Result       Flag       Units  Drug Present and Declared for Prescription Verification   Oxycodone  2468         EXPECTED   ng/mg creat   Oxymorphone                    1593         EXPECTED   ng/mg creat   Noroxycodone                   2677         EXPECTED   ng/mg creat   Noroxymorphone                 572          EXPECTED   ng/mg  creat    Sources of oxycodone are scheduled prescription medications.    Oxymorphone, noroxycodone, and noroxymorphone are expected    metabolites of oxycodone. Oxymorphone is also available as a    scheduled prescription medication.  ==================================================================== Test                      Result    Flag   Units      Ref Range   Creatinine              98               mg/dL      >=16 ==================================================================== Declared Medications:  The flagging and interpretation on this report are based on the  following declared medications.  Unexpected results may arise from  inaccuracies in the declared medications.   **Note: The testing scope of this panel includes these medications:   Oxycodone (Percocet)   **Note: The testing scope of this panel does not include the  following reported medications:   Acetaminophen (Tylenol)  Acetaminophen (Percocet)  Albuterol (Proair HFA)  Betamethasone (Lotrisone)  Buspirone (Buspar)  Clobetasol (Temovate)  Clotrimazole (Lotrisone)  Diclofenac (Voltaren)  Duloxetine (Cymbalta)  Esomeprazole (Nexium)  Ezetimibe (Zetia)  Fenofibrate (TriCor)  Fluticasone (Advair)  Hydrochlorothiazide (Hydrodiuril)  Multivitamin  Potassium (Klor-Con)  Pregabalin (Lyrica)  Salmeterol (Advair)  Sildenafil  Tamsulosin (Flomax)  Vitamin D3 ==================================================================== For clinical consultation, please call [phone]. ====================================================================       ROS  Constitutional:  low back pain Gastrointestinal: No reported hemesis, hematochezia, vomiting, or acute GI distress Neurological:  LE radicular pain right  Medication Review  Albuterol Sulfate, DULoxetine, acetaminophen, busPIRone, cholecalciferol, clobetasol ointment, clotrimazole-betamethasone, diclofenac sodium, esomeprazole, ezetimibe,  fenofibrate, fluticasone-salmeterol, hydrochlorothiazide, multivitamin, oxyCODONE-acetaminophen, potassium chloride, potassium chloride SA, pregabalin, sildenafil, and tamsulosin  History Review  Allergy: Mr. Radde is allergic to niacin-lovastatin er, atorvastatin, other, simvastatin, and propoxyphene. Drug: Mr. Rodabaugh  reports no history of drug use. Alcohol:  reports no history of alcohol use. Tobacco:  reports that he quit smoking about 10 years ago. His smoking use included cigarettes. He started smoking about 55 years ago. He has a 67.5 pack-year smoking history. He has never used smokeless tobacco. Social: Mr. Clem  reports that he quit smoking about 10 years ago. His smoking use included cigarettes. He started smoking about 55 years ago. He has a 67.5 pack-year smoking history. He has never used smokeless tobacco. He reports that he does not drink alcohol and does not use drugs. Medical:  has a past medical history of Anxiety, Asthma, Benign essential HTN (06/24/2015), BPH (benign prostatic hyperplasia), COPD (chronic obstructive pulmonary disease) (HCC), Coronary artery disease (02/16/2014), DDD (degenerative disc disease), cervical, Degenerative disc disease, cervical (01/22/2017), Depression, DJD (degenerative joint disease), Emphysema of lung (HCC),  Heel pain (10/10/2017), Hyperglobulinemia, Lumbar degenerative disc disease (01/22/2017), Major depression in remission (HCC) (03/10/2014), Migraines, Mitral regurgitation, Mixed hyperlipidemia (02/16/2014), Neck pain (01/24/2017), OSA (obstructive sleep apnea) (07/05/2016), Osteoarthritis of knee (08/12/2014), Prostate cancer (HCC) (2019), RA (rheumatoid arthritis) (HCC), Spinal stenosis of lumbar region with neurogenic claudication (04/18/2016), Spondylosis without myelopathy or radiculopathy, lumbar region (01/22/2017), and Venous insufficiency of both lower extremities (11/07/2016). Surgical: Mr. Egler  has a past surgical history that includes  Cholecystectomy; Knee surgery (Left); deviated septum repair; Cardiac catheterization; Colonoscopy; Nasal septum surgery; Colonoscopy with propofol (N/A, 01/09/2018); and Esophagogastroduodenoscopy (egd) with propofol (N/A, 01/09/2018). Family: family history includes Heart disease in his mother.  Laboratory Chemistry Profile   Renal Lab Results  Component Value Date   BUN 25 (H) 06/12/2022   CREATININE 0.91 06/12/2022   GFRNONAA >60 06/12/2022     Hepatic No results found for: "AST", "ALT", "ALBUMIN", "ALKPHOS", "HCVAB", "AMYLASE", "LIPASE", "AMMONIA"   Electrolytes Lab Results  Component Value Date   NA 136 06/12/2022   K 4.2 06/12/2022   CL 103 06/12/2022   CALCIUM 9.5 06/12/2022     Bone Lab Results  Component Value Date   TESTOSTERONE <3 (L) 04/29/2018     Inflammation (CRP: Acute Phase) (ESR: Chronic Phase) No results found for: "CRP", "ESRSEDRATE", "LATICACIDVEN"     Note: Above Lab results reviewed.   Physical Exam  General appearance: Well nourished, well developed, and well hydrated. In no apparent acute distress Mental status: Alert, oriented x 3 (person, place, & time)       Respiratory: No evidence of acute respiratory distress Eyes: PERLA Vitals: BP (!) 150/94   Pulse (!) 107   Temp (!) 97.4 F (36.3 C)   Ht 5\' 8"  (1.727 m)   Wt 215 lb (97.5 kg)   SpO2 98%   BMI 32.69 kg/m  BMI: Estimated body mass index is 32.69 kg/m as calculated from the following:   Height as of this encounter: 5\' 8"  (1.727 m).   Weight as of this encounter: 215 lb (97.5 kg). Ideal: Ideal body weight: 68.4 kg (150 lb 12.7 oz) Adjusted ideal body weight: 80 kg (176 lb 7.6 oz)    Lumbar Spine Area Exam  Skin & Axial Inspection: No masses, redness, or swelling Alignment: Symmetrical Functional ROM: Pain restricted ROM affecting both sides Stability: No instability detected Muscle Tone/Strength: Functionally intact. No obvious neuro-muscular anomalies detected. Sensory  (Neurological): Dermatomal pain pattern   Lower Extremity Exam      Side: Right lower extremity   Side: Left lower extremity  Stability: No instability observed           Stability: No instability observed          Skin & Extremity Inspection: Skin color, temperature, and hair growth are WNL. No peripheral edema or cyanosis. No masses, redness, swelling, asymmetry, or associated skin lesions. No contractures.   Skin & Extremity Inspection: Skin color, temperature, and hair growth are WNL. No peripheral edema or cyanosis. No masses, redness, swelling, asymmetry, or associated skin lesions. No contractures.  Functional ROM: Pain restricted ROM for hip and knee joints           Functional ROM: Pain restricted ROM for hip and knee joints          Muscle Tone/Strength: Functionally intact. No obvious neuro-muscular anomalies detected.   Muscle Tone/Strength: Functionally intact. No obvious neuro-muscular anomalies detected.  Sensory (Neurological): Dermatomal pain pattern + SLR        Sensory (Neurological):  Dermatomal pain pattern, +SLR   DTR: Patellar: deferred today Achilles: deferred today Plantar: deferred today   DTR: Patellar: deferred today Achilles: deferred today Plantar: deferred today  Palpation: No palpable anomalies   Palpation: No palpable anomalies    5 out of 5 strength bilateral lower extremity: Plantar flexion, dorsiflexion, knee flexion, knee extension.  Assessment   Status Diagnosis  Worsening Worsening Worsening 1. Chronic radicular lumbar pain   2. Spinal stenosis of lumbar region with neurogenic claudication   3. Neuroforaminal stenosis of lumbar spine   4. Lumbar spondylosis   5. Right hand pain   6. Chronic pain syndrome        Plan of Care   Mr. HAVISH MARLING has a current medication list which includes the following long-term medication(s): albuterol sulfate, duloxetine, esomeprazole, ezetimibe, fenofibrate, hydrochlorothiazide, potassium chloride,  pregabalin, and fluticasone-salmeterol.  Pharmacotherapy (Medications Ordered): Meds ordered this encounter  Medications   oxyCODONE-acetaminophen (PERCOCET) 10-325 MG tablet    Sig: Take 1 tablet by mouth every 8 (eight) hours as needed for pain.    Dispense:  90 tablet    Refill:  0   oxyCODONE-acetaminophen (PERCOCET) 10-325 MG tablet    Sig: Take 1 tablet by mouth every 8 (eight) hours as needed for pain.    Dispense:  90 tablet    Refill:  0   oxyCODONE-acetaminophen (PERCOCET) 10-325 MG tablet    Sig: Take 1 tablet by mouth every 8 (eight) hours as needed for pain.    Dispense:  90 tablet    Refill:  0   Orders Placed This Encounter  Procedures   MR LUMBAR SPINE WO CONTRAST    Patient presents with axial pain with possible radicular component. Please assist [place] in identifying specific level(s) and laterality of any additional findings such as: 1. Facet (Zygapophyseal) joint DJD (Hypertrophy, space narrowing, subchondral sclerosis, and/or osteophyte formation) 2. DDD and/or IVDD (Loss of disc height, desiccation, gas patterns, osteophytes, endplate sclerosis, or "Black disc disease") 3. Pars defects 4. Spondylolisthesis, spondylosis, and/or spondyloarthropathies (include Degree/Grade of displacement in mm) (stability) 5. Vertebral body Fractures (acute/chronic) (state percentage of collapse) 6. Demineralization (osteopenia/osteoporotic) 7. Bone pathology 8. Foraminal narrowing  9. Surgical changes 10. Central, Lateral Recess, and/or Foraminal Stenosis (include AP diameter of stenosis in mm) 11. Surgical changes (hardware type, status, and presence of fibrosis) 12. Modic Type Changes (MRI only) 13. IVDD (Disc bulge, protrusion, herniation, extrusion) (Level, laterality, extent)    Standing Status:   Future    Standing Expiration Date:   01/13/2023    Scheduling Instructions:     Please make sure that the patient understands that this needs to be done as soon as possible. Never  have the patient do the imaging "just before the next appointment". Inform patient that having the imaging done within the [facility] will expedite the availability of the results and will provide      imaging availability to the requesting physician. In addition inform the patient that the imaging order has an expiration date and will not be renewed if not done within the active period.    Order Specific Question:   What is the patient's sedation requirement?    Answer:   No Sedation    Order Specific Question:   Does the patient have a pacemaker or implanted devices?    Answer:   No    Order Specific Question:   Preferred imaging location?    Answer:   [facility] (table limit-350lbs)  Order Specific Question:   Call Results- Best Contact Number?    Answer:   [phone] [facility])    Order Specific Question:   Radiology Contrast Protocol - do NOT remove file path    Answer:   [email or web address]   Continue Cymbalta and Lyrica as prescribed  Follow-up plan:   Return in about 3 months (around 03/15/2023) for MM, F2F.    Recent Visits Date Type Provider Dept  09/21/22 Office Visit Edward Jolly, MD [facility]  Showing recent visits within past 90 days and meeting all other requirements Today's Visits Date Type Provider Dept  12/14/22 Office Visit Edward Jolly, MD [facility]  Showing today's visits and meeting all other requirements Future Appointments Date Type Provider Dept  03/13/23 Appointment Edward Jolly, MD [facility]  Showing future appointments within next 90 days and meeting all other requirements  I discussed the assessment and treatment plan with the patient. The patient was provided an opportunity to ask questions and all were answered. The patient agreed with the plan and demonstrated an understanding of the instructions.  Patient advised to call back or seek an in-person evaluation if  the symptoms or condition worsens.  Duration of encounter:30 minutes.  Note by: Edward Jolly, MD Date: 12/14/2022; Time: 1:30 PM

## 2022-12-26 ENCOUNTER — Other Ambulatory Visit: Payer: Self-pay | Admitting: *Deleted

## 2022-12-26 ENCOUNTER — Telehealth: Payer: Self-pay | Admitting: Student in an Organized Health Care Education/Training Program

## 2022-12-26 DIAGNOSIS — M48062 Spinal stenosis, lumbar region with neurogenic claudication: Secondary | ICD-10-CM

## 2022-12-26 DIAGNOSIS — M48061 Spinal stenosis, lumbar region without neurogenic claudication: Secondary | ICD-10-CM

## 2022-12-26 DIAGNOSIS — G8929 Other chronic pain: Secondary | ICD-10-CM

## 2022-12-26 MED ORDER — OXYCODONE-ACETAMINOPHEN 10-325 MG PO TABS: 1.0000 | ORAL_TABLET | Freq: Three times a day (TID) | ORAL | 0 refills | Status: AC | PRN

## 2022-12-26 NOTE — Telephone Encounter (Signed)
His regular pharmacy does not have his Oxycodone. [facility] does. Would like Dr Cherylann Ratel to send script to [facility] please.

## 2022-12-26 NOTE — Telephone Encounter (Signed)
Med refill request sent to [facility]

## 2022-12-27 NOTE — Telephone Encounter (Signed)
Attempted to call patient to make him aware.  No answer and no voicemail.

## 2022-12-28 ENCOUNTER — Ambulatory Visit
Admission: RE | Admit: 2022-12-28 | Discharge: 2022-12-28 | Disposition: A | Discharge status: Home / Self Care | Payer: Medicare HMO | Source: Ambulatory Visit | Attending: Student in an Organized Health Care Education/Training Program | Admitting: Student in an Organized Health Care Education/Training Program

## 2022-12-28 ENCOUNTER — Telehealth: Payer: Self-pay | Admitting: Student in an Organized Health Care Education/Training Program

## 2022-12-28 DIAGNOSIS — M48062 Spinal stenosis, lumbar region with neurogenic claudication: Secondary | ICD-10-CM

## 2022-12-28 DIAGNOSIS — G8929 Other chronic pain: Secondary | ICD-10-CM

## 2022-12-28 DIAGNOSIS — M5416 Radiculopathy, lumbar region: Secondary | ICD-10-CM | POA: Diagnosis present

## 2022-12-28 NOTE — Telephone Encounter (Signed)
Called [facility], they do not have Oxy/Acet 10/325 in stock, will not have it in the near future. I called patient, spoke with his wife, instructed her to call other pharmacies to fine one which has the medication.

## 2022-12-28 NOTE — Telephone Encounter (Signed)
Pharmacy need a prescription for Oxycodone for this month to be send in , the one that was send in is for October and pharmacy states by law she can't fill Oct prescription. [facility]. Pharmacy states that patient is out of medication. Please give patient and pharmacy a call. TY

## 2022-12-28 NOTE — Telephone Encounter (Signed)
Attempted to call pharmacy to clarify, they are closed for lunch

## 2023-01-22 ENCOUNTER — Encounter: Payer: Self-pay | Admitting: Student in an Organized Health Care Education/Training Program

## 2023-01-22 ENCOUNTER — Other Ambulatory Visit: Payer: Self-pay | Admitting: *Deleted

## 2023-01-22 ENCOUNTER — Ambulatory Visit
Payer: Medicare HMO | Attending: Student in an Organized Health Care Education/Training Program | Admitting: Student in an Organized Health Care Education/Training Program

## 2023-01-22 VITALS — BP 132/68 | Temp 97.5°F | Resp 16 | Ht 67.0 in | Wt 215.0 lb

## 2023-01-22 DIAGNOSIS — G8929 Other chronic pain: Secondary | ICD-10-CM | POA: Diagnosis present

## 2023-01-22 DIAGNOSIS — M48062 Spinal stenosis, lumbar region with neurogenic claudication: Secondary | ICD-10-CM | POA: Insufficient documentation

## 2023-01-22 DIAGNOSIS — M5416 Radiculopathy, lumbar region: Secondary | ICD-10-CM | POA: Diagnosis present

## 2023-01-22 DIAGNOSIS — M48061 Spinal stenosis, lumbar region without neurogenic claudication: Secondary | ICD-10-CM | POA: Insufficient documentation

## 2023-01-22 DIAGNOSIS — G894 Chronic pain syndrome: Secondary | ICD-10-CM | POA: Diagnosis present

## 2023-01-22 MED ORDER — PREGABALIN 75 MG PO CAPS: 75.0000 mg | ORAL_CAPSULE | Freq: Every evening | ORAL | 2 refills | Status: DC | PRN

## 2023-01-22 NOTE — Progress Notes (Signed)
Safety precautions to be maintained throughout the outpatient stay will include: orient to surroundings, keep bed in low position, maintain call bell within reach at all times, provide assistance with transfer out of bed and ambulation.  

## 2023-01-22 NOTE — Progress Notes (Signed)
PROVIDER NOTE: Information contained herein reflects review and annotations entered in association with encounter. Interpretation of such information and data should be left to medically-trained personnel. Information provided to patient can be located elsewhere in the medical record under "Patient Instructions". Document created using STT-dictation technology, any transcriptional errors that may result from process are unintentional.    Patient: Gary [name]  Service Category: E/M  Provider: Edward Jolly, MD  DOB: 02-20-1951  DOS: 01/22/2023  Referring Provider: Lauro Regulus, MD  MRN: [id number]  Specialty: Interventional Pain Management  PCP: Lauro Regulus, MD  Type: Established Patient  Setting: Ambulatory outpatient    Location: Office  Delivery: Face-to-face     HPI  Gary [name], a 72 y.o. year old male, is here today because of his Chronic radicular lumbar pain [M54.16, G89.29]. Gary [name] primary complain today is Back Pain (Lumbar bilateral left worse )  Pertinent problems: Gary [name] has Lumbar spondylosis; Lumbar degenerative disc disease; Degenerative disc disease, cervical; Chronic pain syndrome; Facet arthropathy, lumbar; Spinal stenosis of lumbar region with neurogenic claudication; Chronic bilateral low back pain with left-sided sciatica; Chronic left sacroiliac joint pain; Chronic hip pain, left; Cervical spondylosis with radiculopathy; Groin pain, chronic, right; and Chronic left shoulder pain on their pertinent problem list. Pain Assessment: Severity of Chronic pain is reported as a 6 /10. Location: Back Lower, Left, Right/down left leg to the foot and right leg at times.. Onset: More than a month ago. Quality: Aching, Burning, Constant, Throbbing, Numbness, Stabbing, Shooting. Timing: Constant. Modifying factor(s): medications and procedures.. Vitals:  height is 5\' 7"  (1.702 m) and weight is 215 lb (97.5 kg). His temporal temperature is 97.5 F (36.4  C) (abnormal). His blood pressure is 132/68. His respiration is 16 and oxygen saturation is 90%.  BMI: Estimated body mass index is 33.67 kg/m as calculated from the following:   Height as of this encounter: 5\' 7"  (1.702 m).   Weight as of this encounter: 215 lb (97.5 kg). Last encounter: 12/14/2022. Last procedure: Visit date not found.  Reason for encounter:  Low back pain with weakness in bilateral legs, limited standing and walking distance.  Left leg is worse than right. .   Pharmacotherapy Assessment  Analgesic:  Percocet 10 mg TID prn, #90/month, MME=45   Monitoring: [place] PMP: PDMP not reviewed this encounter.       Pharmacotherapy: No side-effects or adverse reactions reported. Compliance: No problems identified. Effectiveness: Clinically acceptable.  [name], RN  01/22/2023  2:28 PM  Sign when Signing Visit Safety precautions to be maintained throughout the outpatient stay will include: orient to surroundings, keep bed in low position, maintain call bell within reach at all times, provide assistance with transfer out of bed and ambulation.   No results found for: "CBDTHCR" No results found for: "D8THCCBX" No results found for: "D9THCCBX"  UDS:  Summary  Date Value Ref Range Status  09/21/2022 Note  Final    Comment:    ==================================================================== [facility] (MW) ==================================================================== Test                             Result       Flag       Units  Drug Present and Declared for Prescription Verification   Oxycodone                      2468  EXPECTED   ng/mg creat   Oxymorphone                    1593         EXPECTED   ng/mg creat   Noroxycodone                   2677         EXPECTED   ng/mg creat   Noroxymorphone                 572          EXPECTED   ng/mg creat    Sources of oxycodone are scheduled prescription medications.    Oxymorphone,  noroxycodone, and noroxymorphone are expected    metabolites of oxycodone. Oxymorphone is also available as a    scheduled prescription medication.  ==================================================================== Test                      Result    Flag   Units      Ref Range   Creatinine              98               mg/dL      >=25 ==================================================================== Declared Medications:  The flagging and interpretation on this report are based on the  following declared medications.  Unexpected results may arise from  inaccuracies in the declared medications.   **Note: The testing scope of this panel includes these medications:   Oxycodone (Percocet)   **Note: The testing scope of this panel does not include the  following reported medications:   Acetaminophen (Tylenol)  Acetaminophen (Percocet)  Albuterol (Proair HFA)  Betamethasone (Lotrisone)  Buspirone (Buspar)  Clobetasol (Temovate)  Clotrimazole (Lotrisone)  Diclofenac (Voltaren)  Duloxetine (Cymbalta)  Esomeprazole (Nexium)  Ezetimibe (Zetia)  Fenofibrate (TriCor)  Fluticasone (Advair)  Hydrochlorothiazide (Hydrodiuril)  Multivitamin  Potassium (Klor-Con)  Pregabalin (Lyrica)  Salmeterol (Advair)  Sildenafil  Tamsulosin (Flomax)  Vitamin D3 ==================================================================== For clinical consultation, please call [phone]. ====================================================================       ROS  Constitutional: Denies any fever or chills Gastrointestinal: No reported hemesis, hematochezia, vomiting, or acute GI distress Musculoskeletal:  Low back and bilateral leg pain and intermittent weakness with prolonged standing and walking Neurological: No reported episodes of acute onset apraxia, aphasia, dysarthria, agnosia, amnesia, paralysis, loss of coordination, or loss of consciousness  Medication Review  Albuterol  Sulfate, DULoxetine, acetaminophen, aspirin EC, busPIRone, cholecalciferol, clobetasol ointment, clotrimazole-betamethasone, diclofenac sodium, esomeprazole, ezetimibe, fenofibrate, fluticasone-salmeterol, hydrochlorothiazide, meloxicam, multivitamin, oxyCODONE-acetaminophen, potassium chloride, potassium chloride SA, pregabalin, sildenafil, and tamsulosin  History Review  Allergy: Mr. Lowe is allergic to niacin-lovastatin er, atorvastatin, other, simvastatin, and propoxyphene. Drug: Mr. Binger  reports no history of drug use. Alcohol:  reports no history of alcohol use. Tobacco:  reports that he quit smoking about 10 years ago. His smoking use included cigarettes. He started smoking about 55 years ago. He has a 67.5 pack-year smoking history. He has never used smokeless tobacco. Social: Mr. Schou  reports that he quit smoking about 10 years ago. His smoking use included cigarettes. He started smoking about 55 years ago. He has a 67.5 pack-year smoking history. He has never used smokeless tobacco. He reports that he does not drink alcohol and does not use drugs. Medical:  has a past medical history of Anxiety, Asthma, Benign essential HTN (06/24/2015), BPH (benign prostatic hyperplasia), COPD (chronic obstructive pulmonary  disease) (HCC), Coronary artery disease (02/16/2014), DDD (degenerative disc disease), cervical, Degenerative disc disease, cervical (01/22/2017), Depression, DJD (degenerative joint disease), Emphysema of lung (HCC), Heel pain (10/10/2017), Hyperglobulinemia, Lumbar degenerative disc disease (01/22/2017), Major depression in remission (HCC) (03/10/2014), Migraines, Mitral regurgitation, Mixed hyperlipidemia (02/16/2014), Neck pain (01/24/2017), OSA (obstructive sleep apnea) (07/05/2016), Osteoarthritis of knee (08/12/2014), Prostate cancer (HCC) (2019), RA (rheumatoid arthritis) (HCC), Spinal stenosis of lumbar region with neurogenic claudication (04/18/2016), Spondylosis without myelopathy or  radiculopathy, lumbar region (01/22/2017), and Venous insufficiency of both lower extremities (11/07/2016). Surgical: Mr. Jemison  has a past surgical history that includes Cholecystectomy; Knee surgery (Left); deviated septum repair; Cardiac catheterization; Colonoscopy; Nasal septum surgery; Colonoscopy with propofol (N/A, 01/09/2018); and Esophagogastroduodenoscopy (egd) with propofol (N/A, 01/09/2018). Family: family history includes Heart disease in his mother.  Laboratory Chemistry Profile   Renal Lab Results  Component Value Date   BUN 25 (H) 06/12/2022   CREATININE 0.91 06/12/2022   GFRNONAA >60 06/12/2022    Hepatic No results found for: "AST", "ALT", "ALBUMIN", "ALKPHOS", "HCVAB", "AMYLASE", "LIPASE", "AMMONIA"  Electrolytes Lab Results  Component Value Date   NA 136 06/12/2022   K 4.2 06/12/2022   CL 103 06/12/2022   CALCIUM 9.5 06/12/2022    Bone Lab Results  Component Value Date   TESTOSTERONE <3 (L) 04/29/2018    Inflammation (CRP: Acute Phase) (ESR: Chronic Phase) No results found for: "CRP", "ESRSEDRATE", "LATICACIDVEN"       Note: Above Lab results reviewed.  Recent Imaging Review  MR LUMBAR SPINE WO CONTRAST CLINICAL DATA:  Low back pain, symptoms persist with > 6 wks treatment Lumbar radiculopathy, symptoms persist with > 6 wks treatment  EXAM: MRI LUMBAR SPINE WITHOUT CONTRAST  TECHNIQUE: Multiplanar, multisequence MR imaging of the lumbar spine was performed. No intravenous contrast was administered.  COMPARISON:  MR L Spine 03/19/18  FINDINGS: Segmentation:  Standard.  Alignment: Trace retrolisthesis of L1 on L2 and L2 on L3. Grade 1 anterolisthesis of L4 on L5.  Vertebrae: Degenerative endplate changes of the inferior and superior endplates of L1 and L2. No acute fracture. No evidence of discitis osteomyelitis. No focal bone lesion.  Conus medullaris and cauda equina: Conus extends to the L2 level. Conus and cauda equina appear  normal.  Paraspinal and other soft tissues: Negative.  Disc levels:  T12-L1: Mild bilateral facet degenerative change. No spinal canal narrowing. No neural foraminal narrowing.  L1-L2: Moderate disc space loss. Moderate bilateral facet degenerative change with ligamentum flavum hypertrophy. Circumferential disc bulge. Moderate spinal canal narrowing. Severe right and moderate left neural foraminal narrowing. Findings have progressed compared to 2019.  L2-L3: Severe disc space loss. Moderate bilateral facet degenerative change. Moderate to severe spinal canal narrowing. Moderate to severe right and moderate left neural foraminal narrowing. Findings are unchanged compared to 2019.  L3-L4: Mild disc space loss. Moderate bilateral facet degenerative change. Circumferential disc bulge. Moderate spinal canal narrowing. Moderate to severe right and moderate left neural foraminal narrowing. The degree of spinal canal narrowing has progressed compared to 2019.  L4-L5: Mild disc space loss. Severe bilateral facet degenerative change. Circumferential disc bulge. Moderate to severe spinal canal narrowing, progressed from prior exam. Moderate bilateral neural foraminal narrowing, unchanged from prior exam.  L5-S1: Severe bilateral facet degenerative change. No significant disc bulge. Mild spinal canal narrowing. Mild-to-moderate bilateral neural foraminal narrowing. Findings are unchanged from prior exam.  IMPRESSION: 1. Moderate to severe spinal canal narrowing at L1-L2, L2-L3, and L4-L5, progressed at L3-L4 compared to 2019. 2.  Severe right neuroforaminal narrowing at L1-L2 has also progressed from prior exam. 3. Additional multilevel degenerative changes are unchanged, as described above.  Electronically Signed   By: Lorenza Cambridge M.D.   On: 01/17/2023 07:27 Note: Reviewed        Physical Exam  General appearance: Well nourished, well developed, and well hydrated. In no  apparent acute distress Mental status: Alert, oriented x 3 (person, place, & time)       Respiratory: No evidence of acute respiratory distress Eyes: PERLA Vitals: BP 132/68 (BP Location: Right Arm, Patient Position: Sitting, Cuff Size: Normal)   Temp (!) 97.5 F (36.4 C) (Temporal)   Resp 16   Ht 5\' 7"  (1.702 m)   Wt 215 lb (97.5 kg)   SpO2 90%   BMI 33.67 kg/m  BMI: Estimated body mass index is 33.67 kg/m as calculated from the following:   Height as of this encounter: 5\' 7"  (1.702 m).   Weight as of this encounter: 215 lb (97.5 kg). Ideal: Ideal body weight: 66.1 kg (145 lb 11.6 oz) Adjusted ideal body weight: 78.7 kg (173 lb 6.9 oz)  Lumbar Spine Area Exam  Skin & Axial Inspection: No masses, redness, or swelling Alignment: Symmetrical Functional ROM: Pain restricted ROM affecting both sides Stability: No instability detected Muscle Tone/Strength: Functionally intact. No obvious neuro-muscular anomalies detected. Sensory (Neurological): Dermatomal pain pattern     Lower Extremity Exam      Side: Right lower extremity   Side: Left lower extremity  Stability: No instability observed           Stability: No instability observed          Skin & Extremity Inspection: Skin color, temperature, and hair growth are WNL. No peripheral edema or cyanosis. No masses, redness, swelling, asymmetry, or associated skin lesions. No contractures.   Skin & Extremity Inspection: Skin color, temperature, and hair growth are WNL. No peripheral edema or cyanosis. No masses, redness, swelling, asymmetry, or associated skin lesions. No contractures.  Functional ROM: Pain restricted ROM for hip and knee joints           Functional ROM: Pain restricted ROM for hip and knee joints          Muscle Tone/Strength: Functionally intact. No obvious neuro-muscular anomalies detected.   Muscle Tone/Strength: Functionally intact. No obvious neuro-muscular anomalies detected.  Sensory (Neurological): Dermatomal  pain pattern + SLR        Sensory (Neurological): Dermatomal pain pattern, +SLR   DTR: Patellar: deferred today Achilles: deferred today Plantar: deferred today   DTR: Patellar: deferred today Achilles: deferred today Plantar: deferred today  Palpation: No palpable anomalies   Palpation: No palpable anomalies      5 out of 5 strength bilateral lower extremity: Plantar flexion, dorsiflexion, knee flexion, knee extension.  Assessment   Diagnosis Status  1. Chronic radicular lumbar pain   2. Spinal stenosis of lumbar region with neurogenic claudication   3. Neuroforaminal stenosis of lumbar spine   4. Chronic pain syndrome    Having a Flare-up Having a Flare-up Having a Flare-up   Updated Problems: No problems updated.  Plan of Care  Reviewed MRI with patient in detail.  He has multilevel severe degenerative disc disease with multilevel disc herniations resulting in severe spinal canal stenosis at L1-L2, L2-L3 and L4-L5.  He also has severe right L1-L2 neuroforaminal stenosis.  I discussed various options with him including lumbar epidural steroid injection which she has not done within the  last year.  He is currently on a multimodal pain regimen.  He does home stretching exercises and is the caregiver for his wife.  Future considerations could also include referral to neurosurgery which the patient wants to hold off on.  We also discussed spinal cord stimulation as well for chronic lumbar radicular pain.   Pharmacotherapy (Medications Ordered): Meds ordered this encounter  Medications   pregabalin (LYRICA) 75 MG capsule    Sig: Take 1 capsule (75 mg total) by mouth at bedtime as needed.    Dispense:  90 capsule    Refill:  2    Fill one day early if pharmacy is closed on scheduled refill date. May substitute for generic if available.   Orders:  Orders Placed This Encounter  Procedures   Lumbar Epidural Injection    Standing Status:   Future    Standing Expiration Date:    04/24/2023    Scheduling Instructions:     Procedure: Interlaminar Lumbar Epidural Steroid injection (LESI)            Laterality: Midline     Sedation: Patient's choice.     Timeframe: ASAA    Order Specific Question:   Where will this procedure be performed?    Answer:   [facility]   Follow-up plan:   Return in about 23 days (around 02/14/2023) for L4/5 , in clinic NS.      s/p b/l SI-J 12/02/2018- not helpful,  lumbar facets L3-S1 b/l; consider sprint peripheral nerve stimulation of lumbar medial branch lumbar facet arthropathy.          Recent Visits Date Type Provider Dept  12/14/22 Office Visit Edward Jolly, MD [facility]  Showing recent visits within past 90 days and meeting all other requirements Today's Visits Date Type Provider Dept  01/22/23 Office Visit Edward Jolly, MD [facility]  Showing today's visits and meeting all other requirements Future Appointments Date Type Provider Dept  03/13/23 Appointment Edward Jolly, MD [facility]  Showing future appointments within next 90 days and meeting all other requirements  I discussed the assessment and treatment plan with the patient. The patient was provided an opportunity to ask questions and all were answered. The patient agreed with the plan and demonstrated an understanding of the instructions.  Patient advised to call back or seek an in-person evaluation if the symptoms or condition worsens.  Duration of encounter:63minutes.  Total time on encounter, as per AMA guidelines included both the face-to-face and non-face-to-face time personally spent by the physician and/or other qualified health care professional(s) on the day of the encounter (includes time in activities that require the physician or other qualified health care professional and does not include time in activities normally performed by clinical staff). Physician's time may include the following activities when  performed: Preparing to see the patient (e.g., pre-charting review of records, searching for previously ordered imaging, lab work, and nerve conduction tests) Review of prior analgesic pharmacotherapies. Reviewing PMP Interpreting ordered tests (e.g., lab work, imaging, nerve conduction tests) Performing post-procedure evaluations, including interpretation of diagnostic procedures Obtaining and/or reviewing separately obtained history Performing a medically appropriate examination and/or evaluation Counseling and educating the patient/family/caregiver Ordering medications, tests, or procedures Referring and communicating with other health care professionals (when not separately reported) Documenting clinical information in the electronic or other health record Independently interpreting results (not separately reported) and communicating results to the patient/ family/caregiver Care coordination (not separately reported)  Note by: Edward Jolly,  MD Date: 01/22/2023; Time: 3:42 PM

## 2023-01-22 NOTE — Patient Instructions (Signed)
GENERAL RISKS AND COMPLICATIONS  What are the risk, side effects and possible complications? Generally speaking, most procedures are safe.  However, with any procedure there are risks, side effects, and the possibility of complications.  The risks and complications are dependent upon the sites that are lesioned, or the type of nerve block to be performed.  The closer the procedure is to the spine, the more serious the risks are.  Great care is taken when placing the radio frequency needles, block needles or lesioning probes, but sometimes complications can occur. Infection: Any time there is an injection through the skin, there is a risk of infection.  This is why sterile conditions are used for these blocks.  There are four possible types of infection. Localized skin infection. Central Nervous System Infection-This can be in the form of Meningitis, which can be deadly. Epidural Infections-This can be in the form of an epidural abscess, which can cause pressure inside of the spine, causing compression of the spinal cord with subsequent paralysis. This would require an emergency surgery to decompress, and there are no guarantees that the patient would recover from the paralysis. Discitis-This is an infection of the intervertebral discs.  It occurs in about 1% of discography procedures.  It is difficult to treat and it may lead to surgery.        2. Pain: the needles have to go through skin and soft tissues, will cause soreness.       3. Damage to internal structures:  The nerves to be lesioned may be near blood vessels or    other nerves which can be potentially damaged.       4. Bleeding: Bleeding is more common if the patient is taking blood thinners such as  aspirin, Coumadin, Ticiid, Plavix, etc., or if he/she have some genetic predisposition  such as hemophilia. Bleeding into the spinal canal can cause compression of the spinal  cord with subsequent paralysis.  This would require an emergency  surgery to  decompress and there are no guarantees that the patient would recover from the  paralysis.       5. Pneumothorax:  Puncturing of a lung is a possibility, every time a needle is introduced in  the area of the chest or upper back.  Pneumothorax refers to free air around the  collapsed lung(s), inside of the thoracic cavity (chest cavity).  Another two possible  complications related to a similar event would include: Hemothorax and Chylothorax.   These are variations of the Pneumothorax, where instead of air around the collapsed  lung(s), you may have blood or chyle, respectively.       6. Spinal headaches: They may occur with any procedures in the area of the spine.       7. Persistent CSF (Cerebro-Spinal Fluid) leakage: This is a rare problem, but may occur  with prolonged intrathecal or epidural catheters either due to the formation of a fistulous  track or a dural tear.       8. Nerve damage: By working so close to the spinal cord, there is always a possibility of  nerve damage, which could be as serious as a permanent spinal cord injury with  paralysis.       9. Death:  Although rare, severe deadly allergic reactions known as "Anaphylactic  reaction" can occur to any of the medications used.      10. Worsening of the symptoms:  We can always make thing worse.  What are the chances  of something like this happening? Chances of any of this occuring are extremely low.  By statistics, you have more of a chance of getting killed in a motor vehicle accident: while driving to the hospital than any of the above occurring .  Nevertheless, you should be aware that they are possibilities.  In general, it is similar to taking a shower.  Everybody knows that you can slip, hit your head and get killed.  Does that mean that you should not shower again?  Nevertheless always keep in mind that statistics do not mean anything if you happen to be on the wrong side of them.  Even if a procedure has a 1 (one) in a  1,000,000 (million) chance of going wrong, it you happen to be that one..Also, keep in mind that by statistics, you have more of a chance of having something go wrong when taking medications.  Who should not have this procedure? If you are on a blood thinning medication (e.g. Coumadin, Plavix, see list of "Blood Thinners"), or if you have an active infection going on, you should not have the procedure.  If you are taking any blood thinners, please inform your physician.  How should I prepare for this procedure? Do not eat or drink anything at least six hours prior to the procedure. Bring a driver with you .  It cannot be a taxi. Come accompanied by an adult that can drive you back, and that is strong enough to help you if your legs get weak or numb from the local anesthetic. Take all of your medicines the morning of the procedure with just enough water to swallow them. If you have diabetes, make sure that you are scheduled to have your procedure done first thing in the morning, whenever possible. If you have diabetes, take only half of your insulin dose and notify our nurse that you have done so as soon as you arrive at the clinic. If you are diabetic, but only take blood sugar pills (oral hypoglycemic), then do not take them on the morning of your procedure.  You may take them after you have had the procedure. Do not take aspirin or any aspirin-containing medications, at least eleven (11) days prior to the procedure.  They may prolong bleeding. Wear loose fitting clothing that may be easy to take off and that you would not mind if it got stained with Betadine or blood. Do not wear any jewelry or perfume Remove any nail coloring.  It will interfere with some of our monitoring equipment.  NOTE: Remember that this is not meant to be interpreted as a complete list of all possible complications.  Unforeseen problems may occur.  BLOOD THINNERS The following drugs contain aspirin or other products,  which can cause increased bleeding during surgery and should not be taken for 2 weeks prior to and 1 week after surgery.  If you should need take something for relief of minor pain, you may take acetaminophen which is found in Tylenol,m Datril, Anacin-3 and Panadol. It is not blood thinner. The products listed below are.  Do not take any of the products listed below in addition to any listed on your instruction sheet.  A.P.C or A.P.C with Codeine Codeine Phosphate Capsules #3 Ibuprofen Ridaura  ABC compound Congesprin Imuran rimadil  Advil Cope Indocin Robaxisal  Alka-Seltzer Effervescent Pain Reliever and Antacid Coricidin or Coricidin-D  Indomethacin Rufen  Alka-Seltzer plus Cold Medicine Cosprin Ketoprofen S-A-C Tablets  Anacin Analgesic Tablets or Capsules Coumadin  Korlgesic Salflex  Anacin Extra Strength Analgesic tablets or capsules CP-2 Tablets Lanoril Salicylate  Anaprox Cuprimine Capsules Levenox Salocol  Anexsia-D Dalteparin Magan Salsalate  Anodynos Darvon compound Magnesium Salicylate Sine-off  Ansaid Dasin Capsules Magsal Sodium Salicylate  Anturane Depen Capsules Marnal Soma  APF Arthritis pain formula Dewitt's Pills Measurin Stanback  Argesic Dia-Gesic Meclofenamic Sulfinpyrazone  Arthritis Bayer Timed Release Aspirin Diclofenac Meclomen Sulindac  Arthritis pain formula Anacin Dicumarol Medipren Supac  Analgesic (Safety coated) Arthralgen Diffunasal Mefanamic Suprofen  Arthritis Strength Bufferin Dihydrocodeine Mepro Compound Suprol  Arthropan liquid Dopirydamole Methcarbomol with Aspirin Synalgos  ASA tablets/Enseals Disalcid Micrainin Tagament  Ascriptin Doan's Midol Talwin  Ascriptin A/D Dolene Mobidin Tanderil  Ascriptin Extra Strength Dolobid Moblgesic Ticlid  Ascriptin with Codeine Doloprin or Doloprin with Codeine Momentum Tolectin  Asperbuf Duoprin Mono-gesic Trendar  Aspergum Duradyne Motrin or Motrin IB Triminicin  Aspirin plain, buffered or enteric coated  Durasal Myochrisine Trigesic  Aspirin Suppositories Easprin Nalfon Trillsate  Aspirin with Codeine Ecotrin Regular or Extra Strength Naprosyn Uracel  Atromid-S Efficin Naproxen Ursinus  Auranofin Capsules Elmiron Neocylate Vanquish  Axotal Emagrin Norgesic Verin  Azathioprine Empirin or Empirin with Codeine Normiflo Vitamin E  Azolid Emprazil Nuprin Voltaren  Bayer Aspirin plain, buffered or children's or timed BC Tablets or powders Encaprin Orgaran Warfarin Sodium  Buff-a-Comp Enoxaparin Orudis Zorpin  Buff-a-Comp with Codeine Equegesic Os-Cal-Gesic   Buffaprin Excedrin plain, buffered or Extra Strength Oxalid   Bufferin Arthritis Strength Feldene Oxphenbutazone   Bufferin plain or Extra Strength Feldene Capsules Oxycodone with Aspirin   Bufferin with Codeine Fenoprofen Fenoprofen Pabalate or Pabalate-SF   Buffets II Flogesic Panagesic   Buffinol plain or Extra Strength Florinal or Florinal with Codeine Panwarfarin   Buf-Tabs Flurbiprofen Penicillamine   Butalbital Compound Four-way cold tablets Penicillin   Butazolidin Fragmin Pepto-Bismol   Carbenicillin Geminisyn Percodan   Carna Arthritis Reliever Geopen Persantine   Carprofen Gold's salt Persistin   Chloramphenicol Goody's Phenylbutazone   Chloromycetin Haltrain Piroxlcam   Clmetidine heparin Plaquenil   Cllnoril Hyco-pap Ponstel   Clofibrate Hydroxy chloroquine Propoxyphen         Before stopping any of these medications, be sure to consult the physician who ordered them.  Some, such as Coumadin (Warfarin) are ordered to prevent or treat serious conditions such as "deep thrombosis", "pumonary embolisms", and other heart problems.  The amount of time that you may need off of the medication may also vary with the medication and the reason for which you were taking it.  If you are taking any of these medications, please make sure you notify your pain physician before you undergo any procedures.         Epidural Steroid  Injection Patient Information  Description: The epidural space surrounds the nerves as they exit the spinal cord.  In some patients, the nerves can be compressed and inflamed by a bulging disc or a tight spinal canal (spinal stenosis).  By injecting steroids into the epidural space, we can bring irritated nerves into direct contact with a potentially helpful medication.  These steroids act directly on the irritated nerves and can reduce swelling and inflammation which often leads to decreased pain.  Epidural steroids may be injected anywhere along the spine and from the neck to the low back depending upon the location of your pain.   After numbing the skin with local anesthetic (like Novocaine), a small needle is passed into the epidural space slowly.  You may experience a sensation of pressure while this  is being done.  The entire block usually last less than 10 minutes.  Conditions which may be treated by epidural steroids:  Low back and leg pain Neck and arm pain Spinal stenosis Post-laminectomy syndrome Herpes zoster (shingles) pain Pain from compression fractures  Preparation for the injection:  Do not eat any solid food or dairy products within 8 hours of your appointment.  You may drink clear liquids up to 3 hours before appointment.  Clear liquids include water, black coffee, juice or soda.  No milk or cream please. You may take your regular medication, including pain medications, with a sip of water before your appointment  Diabetics should hold regular insulin (if taken separately) and take 1/2 normal NPH dos the morning of the procedure.  Carry some sugar containing items with you to your appointment. A driver must accompany you and be prepared to drive you home after your procedure.  Bring all your current medications with your. An IV may be inserted and sedation may be given at the discretion of the physician.   A blood pressure cuff, EKG and other monitors will often be applied  during the procedure.  Some patients may need to have extra oxygen administered for a short period. You will be asked to provide medical information, including your allergies, prior to the procedure.  We must know immediately if you are taking blood thinners (like Coumadin/Warfarin)  Or if you are allergic to IV iodine contrast (dye). We must know if you could possible be pregnant.  Possible side-effects: Bleeding from needle site Infection (rare, may require surgery) Nerve injury (rare) Numbness & tingling (temporary) Difficulty urinating (rare, temporary) Spinal headache ( a headache worse with upright posture) Light -headedness (temporary) Pain at injection site (several days) Decreased blood pressure (temporary) Weakness in arm/leg (temporary) Pressure sensation in back/neck (temporary)  Call if you experience: Fever/chills associated with headache or increased back/neck pain. Headache worsened by an upright position. New onset weakness or numbness of an extremity below the injection site Hives or difficulty breathing (go to the emergency room) Inflammation or drainage at the infection site Severe back/neck pain Any new symptoms which are concerning to you  Please note:  Although the local anesthetic injected can often make your back or neck feel good for several hours after the injection, the pain will likely return.  It takes 3-7 days for steroids to work in the epidural space.  You may not notice any pain relief for at least that one week.  If effective, we will often do a series of three injections spaced 3-6 weeks apart to maximally decrease your pain.  After the initial series, we generally will wait several months before considering a repeat injection of the same type.  If you have any questions, please call [phone] [facility]

## 2023-01-31 ENCOUNTER — Ambulatory Visit: Payer: Medicare HMO | Admitting: Student in an Organized Health Care Education/Training Program

## 2023-02-19 ENCOUNTER — Ambulatory Visit
Admission: RE | Admit: 2023-02-19 | Discharge: 2023-02-19 | Disposition: A | Discharge status: Home / Self Care | Payer: Medicare HMO | Source: Ambulatory Visit | Attending: Student in an Organized Health Care Education/Training Program | Admitting: Student in an Organized Health Care Education/Training Program

## 2023-02-19 ENCOUNTER — Encounter: Payer: Self-pay | Admitting: Student in an Organized Health Care Education/Training Program

## 2023-02-19 ENCOUNTER — Ambulatory Visit
Payer: Medicare HMO | Attending: Student in an Organized Health Care Education/Training Program | Admitting: Student in an Organized Health Care Education/Training Program

## 2023-02-19 VITALS — BP 120/87 | HR 98 | Temp 97.3°F | Resp 16 | Ht 66.5 in | Wt 215.0 lb

## 2023-02-19 DIAGNOSIS — M48062 Spinal stenosis, lumbar region with neurogenic claudication: Secondary | ICD-10-CM | POA: Diagnosis present

## 2023-02-19 DIAGNOSIS — G894 Chronic pain syndrome: Secondary | ICD-10-CM | POA: Diagnosis present

## 2023-02-19 DIAGNOSIS — M48061 Spinal stenosis, lumbar region without neurogenic claudication: Secondary | ICD-10-CM | POA: Diagnosis present

## 2023-02-19 DIAGNOSIS — G8929 Other chronic pain: Secondary | ICD-10-CM | POA: Diagnosis not present

## 2023-02-19 DIAGNOSIS — M5416 Radiculopathy, lumbar region: Secondary | ICD-10-CM | POA: Diagnosis present

## 2023-02-19 MED ORDER — ROPIVACAINE HCL 2 MG/ML IJ SOLN
2.0000 mL | Freq: Once | INTRAMUSCULAR | Status: AC
  Administered 2023-02-19: 2 mL via EPIDURAL
  Filled 2023-02-19: qty 20

## 2023-02-19 MED ORDER — DEXAMETHASONE SODIUM PHOSPHATE 10 MG/ML IJ SOLN
10.0000 mg | Freq: Once | INTRAMUSCULAR | Status: AC
  Administered 2023-02-19: 10 mg
  Filled 2023-02-19: qty 1

## 2023-02-19 MED ORDER — LIDOCAINE HCL 2 % IJ SOLN
20.0000 mL | Freq: Once | INTRAMUSCULAR | Status: AC
  Administered 2023-02-19: 100 mg
  Filled 2023-02-19: qty 40

## 2023-02-19 MED ORDER — IOHEXOL 180 MG/ML  SOLN
10.0000 mL | Freq: Once | INTRAMUSCULAR | Status: AC
  Administered 2023-02-19: 10 mL via EPIDURAL
  Filled 2023-02-19: qty 20

## 2023-02-19 MED ORDER — SODIUM CHLORIDE 0.9% FLUSH
2.0000 mL | Freq: Once | INTRAVENOUS | Status: AC
  Administered 2023-02-19: 2 mL

## 2023-02-19 NOTE — Progress Notes (Signed)
Safety precautions to be maintained throughout the outpatient stay will include: orient to surroundings, keep bed in low position, maintain call bell within reach at all times, provide assistance with transfer out of bed and ambulation.  

## 2023-02-19 NOTE — Progress Notes (Deleted)
PROVIDER NOTE: Information contained herein reflects review and annotations entered in association with encounter. Interpretation of such information and data should be left to medically-trained personnel. Information provided to patient can be located elsewhere in the medical record under "Patient Instructions". Document created using STT-dictation technology, any transcriptional errors that may result from process are unintentional.    Patient: Gary [name]  Service Category: Procedure  Provider: Edward Jolly, MD  DOB: [birth year]  DOS: 02/19/2023  Location: [facility]  MRN: [id number]  Setting: Ambulatory - outpatient  Referring Provider: Edward Jolly, MD  Type: Established Patient  Specialty: Interventional Pain Management  PCP: Lauro Regulus, MD   Primary Reason for Visit: Interventional Pain Management Treatment. CC: Back Pain (Lumbar left is worse )    Procedure:          Anesthesia, Analgesia, Anxiolysis:  Type: Therapeutic Inter-Laminar Epidural Steroid Injection           Region: Lumbar Level: L3-4 Level. Laterality: Midline         Type: Local Anesthesia Local Anesthetic: Lidocaine 1-2%   Position: Prone with head of the table was raised to facilitate breathing.   Indications: 1. Chronic radicular lumbar pain   2. Spinal stenosis of lumbar region with neurogenic claudication   3. Neuroforaminal stenosis of lumbar spine   4. Chronic pain syndrome    Pain Score: Pre-procedure: 8 /10 Post-procedure: 8 /10    Pre-op H&P Assessment:  Mr. Cowdin is a 72 y.o. (year old), male patient, seen today for interventional treatment. He  has a past surgical history that includes Cholecystectomy; Knee surgery (Left); deviated septum repair; Cardiac catheterization; Colonoscopy; Nasal septum surgery; Colonoscopy with propofol (N/A, 01/09/2018); and Esophagogastroduodenoscopy (egd) with propofol (N/A, 01/09/2018). Mr. Keilty has a current medication list which  includes the following prescription(s): acetaminophen, albuterol sulfate, aspirin ec, buspirone, cholecalciferol, clobetasol ointment, clotrimazole-betamethasone, diclofenac sodium, duloxetine, esomeprazole, ezetimibe, fenofibrate, fluticasone-salmeterol, hydrochlorothiazide, meloxicam, multivitamin, [START ON 02/24/2023] oxycodone-acetaminophen, oxycodone-acetaminophen, potassium chloride, potassium chloride sa, pregabalin, pregabalin, sildenafil, and tamsulosin, and the following Facility-Administered Medications: dexamethasone, iohexol, leuprolide (6 month), lidocaine, ropivacaine (pf) 2 mg/ml (0.2%), and sodium chloride flush. His primarily concern today is the Back Pain (Lumbar left is worse )   Initial Vital Signs:  Pulse/HCG Rate: 98  Temp:  (!) 97.3 F (36.3 C) Resp: 16 BP: (!) 141/85 SpO2: 96 %  BMI: Estimated body mass index is 34.18 kg/m as calculated from the following:   Height as of this encounter: 5' 6.5" (1.689 m).   Weight as of this encounter: 215 lb (97.5 kg).  Risk Assessment: Allergies: Reviewed. He is allergic to niacin-lovastatin er, atorvastatin, other, simvastatin, and propoxyphene.  Allergy Precautions: None required Coagulopathies: Reviewed. None identified.  Blood-thinner therapy: None at this time Active Infection(s): Reviewed. None identified. Mr. Shroff is afebrile  Site Confirmation: Mr. Achter was asked to confirm the procedure and laterality before marking the site Procedure checklist: Completed Consent: Before the procedure and under the influence of no sedative(s), amnesic(s), or anxiolytics, the patient was informed of the treatment options, risks and possible complications. To fulfill our ethical and legal obligations, as recommended by the American Medical Association's Code of Ethics, I have informed the patient of my clinical impression; the nature and purpose of the treatment or procedure; the risks, benefits, and possible complications of the  intervention; the alternatives, including doing nothing; the risk(s) and benefit(s) of the alternative treatment(s) or procedure(s); and the risk(s) and benefit(s) of doing nothing. The patient was  provided information about the general risks and possible complications associated with the procedure. These may include, but are not limited to: failure to achieve desired goals, infection, bleeding, organ or nerve damage, allergic reactions, paralysis, and death. In addition, the patient was informed of those risks and complications associated to Spine-related procedures, such as failure to decrease pain; infection (i.e.: Meningitis, epidural or intraspinal abscess); bleeding (i.e.: epidural hematoma, subarachnoid hemorrhage, or any other type of intraspinal or peri-dural bleeding); organ or nerve damage (i.e.: Any type of peripheral nerve, nerve root, or spinal cord injury) with subsequent damage to sensory, motor, and/or autonomic systems, resulting in permanent pain, numbness, and/or weakness of one or several areas of the body; allergic reactions; (i.e.: anaphylactic reaction); and/or death. Furthermore, the patient was informed of those risks and complications associated with the medications. These include, but are not limited to: allergic reactions (i.e.: anaphylactic or anaphylactoid reaction(s)); adrenal axis suppression; blood sugar elevation that in diabetics may result in ketoacidosis or comma; water retention that in patients with history of congestive heart failure may result in shortness of breath, pulmonary edema, and decompensation with resultant heart failure; weight gain; swelling or edema; medication-induced neural toxicity; particulate matter embolism and blood vessel occlusion with resultant organ, and/or nervous system infarction; and/or aseptic necrosis of one or more joints. Finally, the patient was informed that Medicine is not an exact science; therefore, there is also the possibility of  unforeseen or unpredictable risks and/or possible complications that may result in a catastrophic outcome. The patient indicated having understood very clearly. We have given the patient no guarantees and we have made no promises. Enough time was given to the patient to ask questions, all of which were answered to the patient's satisfaction. Mr. Ekdahl has indicated that he wanted to continue with the procedure. Attestation: I, the ordering provider, attest that I have discussed with the patient the benefits, risks, side-effects, alternatives, likelihood of achieving goals, and potential problems during recovery for the procedure that I have provided informed consent. Date  Time: 02/19/2023 11:29 AM  Pre-Procedure Preparation:  Monitoring: As per clinic protocol. Respiration, ETCO2, SpO2, BP, heart rate and rhythm monitor placed and checked for adequate function Safety Precautions: Patient was assessed for positional comfort and pressure points before starting the procedure. Time-out: I initiated and conducted the "Time-out" before starting the procedure, as per protocol. The patient was asked to participate by confirming the accuracy of the "Time Out" information. Verification of the correct person, site, and procedure were performed and confirmed by me, the nursing staff, and the patient. "Time-out" conducted as per Joint Commission's Universal Protocol (UP.01.01.01). Time:    Description of Procedure:          Target Area: The interlaminar space, initially targeting the lower laminar border of the superior vertebral body. Approach: Paramedial approach. Area Prepped: Entire Posterior Lumbar Region DuraPrep (Iodine Povacrylex [0.7% available iodine] and Isopropyl Alcohol, 74% w/w) Safety Precautions: Aspiration looking for blood return was conducted prior to all injections. At no point did we inject any substances, as a needle was being advanced. No attempts were made at seeking any paresthesias.  Safe injection practices and needle disposal techniques used. Medications properly checked for expiration dates. SDV (single dose vial) medications used. Description of the Procedure: Protocol guidelines were followed. The procedure needle was introduced through the skin, ipsilateral to the reported pain, and advanced to the target area. Bone was contacted and the needle walked caudad, until the lamina was cleared. The epidural space  was identified using "loss-of-resistance technique" with 2-3 ml of PF-NaCl (0.9% NSS), in a 5cc LOR glass syringe.  Vitals:   02/19/23 1131  BP: (!) 141/85  Pulse: 98  Resp: 16  Temp: (!) 97.3 F (36.3 C)  TempSrc: Temporal  SpO2: 96%  Weight: 215 lb (97.5 kg)  Height: 5' 6.5" (1.689 m)    Start Time:   hrs. End Time:   hrs.  Materials:  Needle(s) Type: Epidural needle Gauge: 22G Length: 3.5-in Medication(s): Please see orders for medications and dosing details. 6 cc solution made of 3 cc of preservative-free saline, 2 cc of 0.2% ropivacaine, 1 cc of Decadron 10 mg/cc.  Imaging Guidance (Spinal):          Type of Imaging Technique: Fluoroscopy Guidance (Spinal) Indication(s): Assistance in needle guidance and placement for procedures requiring needle placement in or near specific anatomical locations not easily accessible without such assistance. Exposure Time: Please see nurses notes. Contrast: Before injecting any contrast, we confirmed that the patient did not have an allergy to iodine, shellfish, or radiological contrast. Once satisfactory needle placement was completed at the desired level, radiological contrast was injected. Contrast injected under live fluoroscopy. No contrast complications. See chart for type and volume of contrast used. Fluoroscopic Guidance: I was personally present during the use of fluoroscopy. "Tunnel Vision Technique" used to obtain the best possible view of the target area. Parallax error corrected before commencing the  procedure. "Direction-depth-direction" technique used to introduce the needle under continuous pulsed fluoroscopy. Once target was reached, antero-posterior, oblique, and lateral fluoroscopic projection used confirm needle placement in all planes. Images permanently stored in EMR. Interpretation: I personally interpreted the imaging intraoperatively. Adequate needle placement confirmed in multiple planes. Appropriate spread of contrast into desired area was observed. No evidence of afferent or efferent intravascular uptake. No intrathecal or subarachnoid spread observed. Permanent images saved into the patient's record.  Post-operative Assessment:  Post-procedure Vital Signs:  Pulse/HCG Rate: 98  Temp:  (!) 97.3 F (36.3 C) Resp: 16 BP: (!) 141/85 SpO2: 96 %  EBL: None  Complications: No immediate post-treatment complications observed by team, or reported by patient.  Note: The patient tolerated the entire procedure well. A repeat set of vitals were taken after the procedure and the patient was kept under observation following institutional policy, for this type of procedure. Post-procedural neurological assessment was performed, showing return to baseline, prior to discharge. The patient was provided with post-procedure discharge instructions, including a section on how to identify potential problems. Should any problems arise concerning this procedure, the patient was given instructions to immediately contact us, at any time, without hesitation. In any case, we plan to contact the patient by telephone for a follow-up status report regarding this interventional procedure.  Comments:  No additional relevant information.  Plan of Care  Orders:  Orders Placed This Encounter  Procedures   DG PAIN CLINIC C-ARM 1-60 MIN NO REPORT    Intraoperative interpretation by procedural physician at [facility].    Standing Status:   Standing    Number of Occurrences:   1    Order Specific  Question:   Reason for exam:    Answer:   Assistance in needle guidance and placement for procedures requiring needle placement in or near specific anatomical locations not easily accessible without such assistance.   Chronic Opioid Analgesic:  Oxycodone 10 mg TID prn, #90/month, MME=45   Medications ordered for procedure: Meds ordered this encounter  Medications   iohexol (  OMNIPAQUE) 180 MG/ML injection 10 mL    Must be Myelogram-compatible. If not available, you may substitute with a water-soluble, non-ionic, hypoallergenic, myelogram-compatible radiological contrast medium.   lidocaine (XYLOCAINE) 2 % (with pres) injection 400 mg   ropivacaine (PF) 2 mg/mL (0.2%) (NAROPIN) injection 2 mL   sodium chloride flush (NS) 0.9 % injection 2 mL   dexamethasone (DECADRON) injection 10 mg   Medications administered: Tinnie Gens C. Erroll Luna" had no medications administered during this visit.  See the medical record for exact dosing, route, and time of administration.  Follow-up plan:   No follow-ups on file.       s/p b/l SI-J 12/02/2018- not helpful,  lumbar facets L3-S1 b/l; consider sprint peripheral nerve stimulation of lumbar medial branch lumbar facet arthropathy.         Recent Visits Date Type Provider Dept  01/22/23 Office Visit Edward Jolly, MD [facility]  12/14/22 Office Visit Edward Jolly, MD [facility]  Showing recent visits within past 90 days and meeting all other requirements Today's Visits Date Type Provider Dept  02/19/23 Procedure visit Edward Jolly, MD [facility]  Showing today's visits and meeting all other requirements Future Appointments Date Type Provider Dept  03/13/23 Appointment Edward Jolly, MD [facility]  Showing future appointments within next 90 days and meeting all other requirements  Disposition: Discharge home  Discharge (Date  Time): 02/19/2023;   hrs.   Primary Care Physician: Lauro Regulus,  MD Location: [facility] Note by: Edward Jolly, MD Date: 02/19/2023; Time: 11:38 AM  Disclaimer:  Medicine is not an exact science. The only guarantee in medicine is that nothing is guaranteed. It is important to note that the decision to proceed with this intervention was based on the information collected from the patient. The Data and conclusions were drawn from the patient's questionnaire, the interview, and the physical examination. Because the information was provided in large part by the patient, it cannot be guaranteed that it has not been purposely or unconsciously manipulated. Every effort has been made to obtain as much relevant data as possible for this evaluation. It is important to note that the conclusions that lead to this procedure are derived in large part from the available data. Always take into account that the treatment will also be dependent on availability of resources and existing treatment guidelines, considered by other Pain Management Practitioners as being common knowledge and practice, at the time of the intervention. For Medico-Legal purposes, it is also important to point out that variation in procedural techniques and pharmacological choices are the acceptable norm. The indications, contraindications, technique, and results of the above procedure should only be interpreted and judged by a Board-Certified Interventional Pain Specialist with extensive familiarity and expertise in the same exact procedure and technique.

## 2023-02-19 NOTE — Progress Notes (Signed)
PROVIDER NOTE: Interpretation of information contained herein should be left to medically-trained personnel. Specific patient instructions are provided elsewhere under "Patient Instructions" section of medical record. This document was created in part using STT-dictation technology, any transcriptional errors that may result from this process are unintentional.  Patient: Gary [name] Type: Established DOB: 02-Sep-1950 MRN: [id number] PCP: Lauro Regulus, MD  Service: Procedure DOS: 02/19/2023 Setting: Ambulatory Location: Ambulatory outpatient facility Delivery: Face-to-face Provider: Edward Jolly, MD Specialty: Interventional Pain Management Specialty designation: 09 Location: Outpatient facility Ref. Prov.: Edward Jolly, MD       Interventional Therapy   Type: Caudal Epidural Steroid Injection  Laterality: Midline         Level: Sacrococcygeal ligament  Imaging: Fluoroscopy-guided         Anesthesia: Local anesthesia (1-2% Lidocaine) DOS: 02/19/2023  Performed by: Edward Jolly, MD  Purpose: Diagnostic/Therapeutic Indications: Low back and lower extremity pain severe enough to impact quality of life or function. Rationale (medical necessity): procedure needed and proper for the diagnosis and/or treatment of Gary [name] medical symptoms and needs. 1. Chronic radicular lumbar pain   2. Spinal stenosis of lumbar region with neurogenic claudication   3. Neuroforaminal stenosis of lumbar spine   4. Chronic pain syndrome    NAS-11 Pain score:   Pre-procedure: 8 /10   Post-procedure: 8 /10     Target: Lumbosacral epidural canal  Location: Epidural space  Region: Caudal canal Approach: Percutaneous  Type of procedure: Epidural block  Position  Prep  Materials:  Position: Prone  Prep solution: ChloraPrep (2% chlorhexidine gluconate and 70% isopropyl alcohol) Prep Area: Entire posterior lumbosacral area  Materials:  Tray: Epidural Needle(s):  Type: Epidural   Gauge (G): 22 Length: 3.5-in  Qty: 1   H&P (Pre-op Assessment):  Gary [name] is a 72 y.o. (year old), male patient, seen today for interventional treatment. He  has a past surgical history that includes Cholecystectomy; Knee surgery (Left); deviated septum repair; Cardiac catheterization; Colonoscopy; Nasal septum surgery; Colonoscopy with propofol (N/A, 01/09/2018); and Esophagogastroduodenoscopy (egd) with propofol (N/A, 01/09/2018). Gary [name] has a current medication list which includes the following prescription(s): acetaminophen, albuterol sulfate, aspirin ec, buspirone, cholecalciferol, clobetasol ointment, clotrimazole-betamethasone, diclofenac sodium, duloxetine, esomeprazole, ezetimibe, fenofibrate, fluticasone-salmeterol, hydrochlorothiazide, meloxicam, multivitamin, [START ON 02/24/2023] oxycodone-acetaminophen, oxycodone-acetaminophen, potassium chloride, potassium chloride sa, pregabalin, pregabalin, sildenafil, and tamsulosin, and the following Facility-Administered Medications: leuprolide (6 month). His primarily concern today is the Back Pain (Lumbar left is worse )  Initial Vital Signs:  Pulse/HCG Rate: 98ECG Heart Rate: 98 Temp: (!) 97.3 F (36.3 C) Resp: 16 BP: (!) 141/85 SpO2: 96 %  BMI: Estimated body mass index is 34.18 kg/m as calculated from the following:   Height as of this encounter: 5' 6.5" (1.689 m).   Weight as of this encounter: 215 lb (97.5 kg).  Risk Assessment: Allergies: Reviewed. He is allergic to niacin-lovastatin er, atorvastatin, other, simvastatin, and propoxyphene.  Allergy Precautions: None required Coagulopathies: Reviewed. None identified.  Blood-thinner therapy: None at this time Active Infection(s): Reviewed. None identified. Gary [name] is afebrile  Site Confirmation: Gary [name] was asked to confirm the procedure and laterality before marking the site Procedure checklist: Completed Consent: Before the procedure and under the influence of  no sedative(s), amnesic(s), or anxiolytics, the patient was informed of the treatment options, risks and possible complications. To fulfill our ethical and legal obligations, as recommended by the American Medical Association's Code of Ethics, I have informed the patient of my clinical impression; the nature  and purpose of the treatment or procedure; the risks, benefits, and possible complications of the intervention; the alternatives, including doing nothing; the risk(s) and benefit(s) of the alternative treatment(s) or procedure(s); and the risk(s) and benefit(s) of doing nothing. The patient was provided information about the general risks and possible complications associated with the procedure. These may include, but are not limited to: failure to achieve desired goals, infection, bleeding, organ or nerve damage, allergic reactions, paralysis, and death. In addition, the patient was informed of those risks and complications associated to Spine-related procedures, such as failure to decrease pain; infection (i.e.: Meningitis, epidural or intraspinal abscess); bleeding (i.e.: epidural hematoma, subarachnoid hemorrhage, or any other type of intraspinal or peri-dural bleeding); organ or nerve damage (i.e.: Any type of peripheral nerve, nerve root, or spinal cord injury) with subsequent damage to sensory, motor, and/or autonomic systems, resulting in permanent pain, numbness, and/or weakness of one or several areas of the body; allergic reactions; (i.e.: anaphylactic reaction); and/or death. Furthermore, the patient was informed of those risks and complications associated with the medications. These include, but are not limited to: allergic reactions (i.e.: anaphylactic or anaphylactoid reaction(s)); adrenal axis suppression; blood sugar elevation that in diabetics may result in ketoacidosis or comma; water retention that in patients with history of congestive heart failure may result in shortness of breath,  pulmonary edema, and decompensation with resultant heart failure; weight gain; swelling or edema; medication-induced neural toxicity; particulate matter embolism and blood vessel occlusion with resultant organ, and/or nervous system infarction; and/or aseptic necrosis of one or more joints. Finally, the patient was informed that Medicine is not an exact science; therefore, there is also the possibility of unforeseen or unpredictable risks and/or possible complications that may result in a catastrophic outcome. The patient indicated having understood very clearly. We have given the patient no guarantees and we have made no promises. Enough time was given to the patient to ask questions, all of which were answered to the patient's satisfaction. Gary [name] has indicated that he wanted to continue with the procedure. Attestation: I, the ordering provider, attest that I have discussed with the patient the benefits, risks, side-effects, alternatives, likelihood of achieving goals, and potential problems during recovery for the procedure that I have provided informed consent. Date  Time: 02/19/2023 11:29 AM   Imaging Guidance (Spinal):          Type of Imaging Technique: Fluoroscopy Guidance (Spinal) Indication(s): Fluoroscopy guidance for needle placement to enhance accuracy in procedures requiring precise needle localization for targeted delivery of medication in or near specific anatomical locations not easily accessible without such real-time imaging assistance. Exposure Time: Please see nurses notes. Contrast: Before injecting any contrast, we confirmed that the patient did not have an allergy to iodine, shellfish, or radiological contrast. Once satisfactory needle placement was completed at the desired level, radiological contrast was injected. Contrast injected under live fluoroscopy. No contrast complications. See chart for type and volume of contrast used. Fluoroscopic Guidance: I was personally  present during the use of fluoroscopy. "Tunnel Vision Technique" used to obtain the best possible view of the target area. Parallax error corrected before commencing the procedure. "Direction-depth-direction" technique used to introduce the needle under continuous pulsed fluoroscopy. Once target was reached, antero-posterior, oblique, and lateral fluoroscopic projection used confirm needle placement in all planes. Images permanently stored in EMR. Interpretation: I personally interpreted the imaging intraoperatively. Adequate needle placement confirmed in multiple planes. Appropriate spread of contrast into desired area was observed. No evidence of afferent  or efferent intravascular uptake. No intrathecal or subarachnoid spread observed. Permanent images saved into the patient's record.  Pre-Procedure Preparation:  Monitoring: As per clinic protocol. Respiration, ETCO2, SpO2, BP, heart rate and rhythm monitor placed and checked for adequate function Safety Precautions: Patient was assessed for positional comfort and pressure points before starting the procedure. Time-out: I initiated and conducted the "Time-out" before starting the procedure, as per protocol. The patient was asked to participate by confirming the accuracy of the "Time Out" information. Verification of the correct person, site, and procedure were performed and confirmed by me, the nursing staff, and the patient. "Time-out" conducted as per Joint Commission's Universal Protocol (UP.01.01.01). Time: 1146 Start Time: 1146 hrs.  Description  Narrative of Procedure:          Start Time: 1146 hrs.  Technical description of procedure:  Safety Precautions: Aspiration looking for blood return was conducted prior to all injections. At no point did we inject any substances, as a needle was being advanced. No attempts were made at seeking any paresthesias. Safe injection practices and needle disposal techniques used. Medications properly checked  for expiration dates. SDV (single dose vial) medications used. Description of the Procedure: Protocol guidelines were followed. The patient was placed in position over the fluoroscopy table. The target area was identified and the area prepped in the usual manner. Skin & deeper tissues infiltrated with local anesthetic. Appropriate amount of time allowed to pass for local anesthetics to take effect. The procedure needle was then advanced to the target area. Proper needle placement secured. Negative aspiration confirmed. Solution injected in intermittent fashion, asking for systemic symptoms every 0.5cc of injectate. The needle/catheter were removed and the area cleansed, making sure to leave some of the prepping solution back to take advantage of its long term bactericidal properties.  5cc solution made of 2cc of preservative-free saline, 2 cc of 0.2% ropivacaine, 1 cc of Decadron 10 mg/cc.   Vitals:   02/19/23 1145 02/19/23 1150 02/19/23 1155 02/19/23 1200  BP: 129/72 112/71 135/88 120/87  Pulse:      Resp: 17 16 18 16   Temp:      TempSrc:      SpO2: 95% 94% 95% 95%  Weight:      Height:         End Time: 1159 hrs.  Post-operative Assessment:  Post-procedure Vital Signs:  Pulse/HCG Rate: 9888 Temp: (!) 97.3 F (36.3 C) Resp: 16 BP: 120/87 SpO2: 95 %  EBL: None  Complications: No immediate post-treatment complications observed by team, or reported by patient.  Note: The patient tolerated the entire procedure well. A repeat set of vitals were taken after the procedure and the patient was kept under observation following institutional policy, for this type of procedure. Post-procedural neurological assessment was performed, showing return to baseline, prior to discharge. The patient was provided with post-procedure discharge instructions, including a section on how to identify potential problems. Should any problems arise concerning this procedure, the patient was given instructions to  immediately contact us, at any time, without hesitation. In any case, we plan to contact the patient by telephone for a follow-up status report regarding this interventional procedure.  Comments:  No additional relevant information.  Plan of Care (POC)  Orders:  Orders Placed This Encounter  Procedures   DG PAIN [facility] C-ARM 1-60 MIN NO REPORT    Intraoperative interpretation by procedural physician at [facility].    Standing Status:   Standing    Number of  Occurrences:   1    Order Specific Question:   Reason for exam:    Answer:   Assistance in needle guidance and placement for procedures requiring needle placement in or near specific anatomical locations not easily accessible without such assistance.   Chronic Opioid Analgesic:   Percocet 10 mg TID prn, #90/month, MME=45   Medications ordered for procedure: Meds ordered this encounter  Medications   iohexol (OMNIPAQUE) 180 MG/ML injection 10 mL    Must be Myelogram-compatible. If not available, you may substitute with a water-soluble, non-ionic, hypoallergenic, myelogram-compatible radiological contrast medium.   lidocaine (XYLOCAINE) 2 % (with pres) injection 400 mg   ropivacaine (PF) 2 mg/mL (0.2%) (NAROPIN) injection 2 mL   sodium chloride flush (NS) 0.9 % injection 2 mL   dexamethasone (DECADRON) injection 10 mg   Medications administered: We administered iohexol, lidocaine, ropivacaine (PF) 2 mg/mL (0.2%), sodium chloride flush, and dexamethasone.  See the medical record for exact dosing, route, and time of administration.  Follow-up plan:   Return for keep scheduled appointment in December..       Recent Visits Date Type Provider Dept  01/22/23 Office Visit Edward Jolly, MD [facility]  12/14/22 Office Visit Edward Jolly, MD [facility]  Showing recent visits within past 90 days and meeting all other requirements Today's Visits Date Type Provider Dept  02/19/23 Procedure visit  Edward Jolly, MD [facility]  Showing today's visits and meeting all other requirements Future Appointments Date Type Provider Dept  03/13/23 Appointment Edward Jolly, MD [facility]  Showing future appointments within next 90 days and meeting all other requirements  Disposition: Discharge home  Discharge (Date  Time): 02/19/2023; 1210 hrs.   Primary Care Physician: Lauro Regulus, MD Location: [facility] Note by: Edward Jolly, MD (TTS technology used. I apologize for any typographical errors that were not detected and corrected.) Date: 02/19/2023; Time: 2:14 PM  Disclaimer:  Medicine is not an Visual merchandiser. The only guarantee in medicine is that nothing is guaranteed. It is important to note that the decision to proceed with this intervention was based on the information collected from the patient. The Data and conclusions were drawn from the patient's questionnaire, the interview, and the physical examination. Because the information was provided in large part by the patient, it cannot be guaranteed that it has not been purposely or unconsciously manipulated. Every effort has been made to obtain as much relevant data as possible for this evaluation. It is important to note that the conclusions that lead to this procedure are derived in large part from the available data. Always take into account that the treatment will also be dependent on availability of resources and existing treatment guidelines, considered by other Pain Management Practitioners as being common knowledge and practice, at the time of the intervention. For Medico-Legal purposes, it is also important to point out that variation in procedural techniques and pharmacological choices are the acceptable norm. The indications, contraindications, technique, and results of the above procedure should only be interpreted and judged by a Board-Certified Interventional Pain Specialist  with extensive familiarity and expertise in the same exact procedure and technique.

## 2023-02-19 NOTE — Patient Instructions (Signed)

## 2023-02-20 ENCOUNTER — Telehealth: Payer: Self-pay

## 2023-02-20 NOTE — Telephone Encounter (Signed)
Post procedure follow up.  LM 

## 2023-03-13 ENCOUNTER — Encounter: Payer: Self-pay | Admitting: Student in an Organized Health Care Education/Training Program

## 2023-03-13 ENCOUNTER — Ambulatory Visit
Payer: Medicare HMO | Attending: Student in an Organized Health Care Education/Training Program | Admitting: Student in an Organized Health Care Education/Training Program

## 2023-03-13 DIAGNOSIS — G8929 Other chronic pain: Secondary | ICD-10-CM | POA: Insufficient documentation

## 2023-03-13 DIAGNOSIS — M48062 Spinal stenosis, lumbar region with neurogenic claudication: Secondary | ICD-10-CM | POA: Insufficient documentation

## 2023-03-13 DIAGNOSIS — M48061 Spinal stenosis, lumbar region without neurogenic claudication: Secondary | ICD-10-CM | POA: Insufficient documentation

## 2023-03-13 DIAGNOSIS — M5416 Radiculopathy, lumbar region: Secondary | ICD-10-CM | POA: Insufficient documentation

## 2023-03-13 MED ORDER — OXYCODONE-ACETAMINOPHEN 10-325 MG PO TABS: 1.0000 | ORAL_TABLET | Freq: Three times a day (TID) | ORAL | 0 refills | Status: AC | PRN

## 2023-03-13 MED ORDER — OXYCODONE-ACETAMINOPHEN 10-325 MG PO TABS: 1.0000 | ORAL_TABLET | Freq: Three times a day (TID) | ORAL | 0 refills | Status: DC | PRN

## 2023-03-13 NOTE — Patient Instructions (Signed)

## 2023-03-13 NOTE — Progress Notes (Signed)
Nursing Pain Medication Assessment:  Safety precautions to be maintained throughout the outpatient stay will include: orient to surroundings, keep bed in low position, maintain call bell within reach at all times, provide assistance with transfer out of bed and ambulation.  Medication Inspection Compliance: Pill count conducted under aseptic conditions, in front of the patient. Neither the pills nor the bottle was removed from the patient's sight at any time. Once count was completed pills were immediately returned to the patient in their original bottle.  Medication: Oxycodone/APAP Pill/Patch Count:  Pill/Patch Appearance: Markings consistent with prescribed medication Bottle Appearance: Standard pharmacy container. Clearly labeled. Filled Date: 29 / 25 / 2024 Last Medication intake:  Today

## 2023-03-13 NOTE — Progress Notes (Signed)
PROVIDER NOTE: Information contained herein reflects review and annotations entered in association with encounter. Interpretation of such information and data should be left to medically-trained personnel. Information provided to patient can be located elsewhere in the medical record under "Patient Instructions". Document created using STT-dictation technology, any transcriptional errors that may result from process are unintentional.    Patient: Gary [name]  Service Category: E/M  Provider: Edward Jolly, MD  DOB: [birth year]  DOS: 03/13/2023  Referring Provider: Lauro Regulus, MD  MRN: [id number]  Specialty: Interventional Pain Management  PCP: Lauro Regulus, MD  Type: Established Patient  Setting: Ambulatory outpatient    Location: Office  Delivery: Face-to-face     HPI  [name], a 72 y.o. year old male, is here today because of his No primary diagnosis found.. [name] primary complain today is Back Pain (lower)  Pertinent problems: [name] has Lumbar spondylosis; Lumbar degenerative disc disease; Degenerative disc disease, cervical; Chronic pain syndrome; Facet arthropathy, lumbar; Spinal stenosis of lumbar region with neurogenic claudication; Chronic bilateral low back pain with left-sided sciatica; Chronic left sacroiliac joint pain; Chronic hip pain, left; Cervical spondylosis with radiculopathy; Groin pain, chronic, right; and Chronic left shoulder pain on their pertinent problem list. Pain Assessment: Severity of Chronic pain is reported as a 6 /10. Location: Back Right, Left/down back of legs bilateral to ankles, right worse. Onset: More than a month ago. Quality: Aching, Discomfort, Stabbing. Timing: Constant. Modifying factor(s): medication, rest  sometimes. Vitals:  height is 5' 6.5" (1.689 m) and weight is 213 lb (96.6 kg). His temperature is 98.2 F (36.8 C). His blood pressure is 139/99 (abnormal) and his pulse is 92. His respiration is 16 and  oxygen saturation is 99%.  BMI: Estimated body mass index is 33.86 kg/m as calculated from the following:   Height as of this encounter: 5' 6.5" (1.689 m).   Weight as of this encounter: 213 lb (96.6 kg). Last encounter: 01/22/2023. Last procedure: 02/19/2023.  Reason for encounter: both, medication management and post-procedure evaluation and assessment.   Post-procedure evaluation   Type: Caudal Epidural Steroid Injection  Laterality: Midline         Level: Sacrococcygeal ligament  Imaging: Fluoroscopy-guided         Anesthesia: Local anesthesia (1-2% Lidocaine) DOS: 02/19/2023  Performed by: Edward Jolly, MD  Purpose: Diagnostic/Therapeutic Indications: Low back and lower extremity pain severe enough to impact quality of life or function. Rationale (medical necessity): procedure needed and proper for the diagnosis and/or treatment of Gary [name] medical symptoms and needs. 1. Chronic radicular lumbar pain   2. Spinal stenosis of lumbar region with neurogenic claudication   3. Neuroforaminal stenosis of lumbar spine   4. Chronic pain syndrome    NAS-11 Pain score:   Pre-procedure: 8 /10   Post-procedure: 8 /10      Effectiveness:  Initial hour after procedure: 100 %  Subsequent 4-6 hours post-procedure: 100 %  Analgesia past initial 6 hours: 75 % (1 week)  Ongoing improvement:  Analgesic:  <20%    Discussed the use of AI scribe software for clinical note transcription with the patient, who gave verbal consent to proceed.  History of Present Illness   The patient, currently on Cymbalta and Lyrica, reports overall satisfaction with his current medication regimen. He notes that the Lyrica, in particular, has been beneficial. There is no mention of new or worsening symptoms. There are no changes in his medical history since the last consultation.  The patient does not report any new medications. He is due for a three-month refill of his current medications.       Pharmacotherapy Assessment  Analgesic:  Percocet 10 mg TID prn, #90/month, MME=45   Monitoring: [place] PMP: PDMP reviewed during this encounter.       Pharmacotherapy: No side-effects or adverse reactions reported. Compliance: No problems identified. Effectiveness: Clinically acceptable.  Newman Pies, RN  03/13/2023 12:01 PM  Sign when Signing Visit Nursing Pain Medication Assessment:  Safety precautions to be maintained throughout the outpatient stay will include: orient to surroundings, keep bed in low position, maintain call bell within reach at all times, provide assistance with transfer out of bed and ambulation.  Medication Inspection Compliance: Pill count conducted under aseptic conditions, in front of the patient. Neither the pills nor the bottle was removed from the patient's sight at any time. Once count was completed pills were immediately returned to the patient in their original bottle.  Medication: Oxycodone/APAP Pill/Patch Count:  Pill/Patch Appearance: Markings consistent with prescribed medication Bottle Appearance: Standard pharmacy container. Clearly labeled. Filled Date: 36 / 25 / 2024 Last Medication intake:  Today    No results found for: "CBDTHCR" No results found for: "D8THCCBX" No results found for: "D9THCCBX"  UDS:  Summary  Date Value Ref Range Status  09/21/2022 Note  Final    Comment:    ==================================================================== [employer] [facility] 13 (MW) ==================================================================== Test                             Result       Flag       Units  Drug Present and Declared for Prescription Verification   Oxycodone                      2468         EXPECTED   ng/mg creat   Oxymorphone                    1593         EXPECTED   ng/mg creat   Noroxycodone                   2677         EXPECTED   ng/mg creat   Noroxymorphone                 572          EXPECTED   ng/mg creat     Sources of oxycodone are scheduled prescription medications.    Oxymorphone, noroxycodone, and noroxymorphone are expected    metabolites of oxycodone. Oxymorphone is also available as a    scheduled prescription medication.  ==================================================================== Test                      Result    Flag   Units      Ref Range   Creatinine              98               mg/dL      >=16 ==================================================================== Declared Medications:  The flagging and interpretation on this report are based on the  following declared medications.  Unexpected results may arise from  inaccuracies in the declared medications.   **Note: The testing scope of this panel includes these medications:  Oxycodone (Percocet)   **Note: The testing scope of this panel does not include the  following reported medications:   Acetaminophen (Tylenol)  Acetaminophen (Percocet)  Albuterol (Proair HFA)  Betamethasone (Lotrisone)  Buspirone (Buspar)  Clobetasol (Temovate)  Clotrimazole (Lotrisone)  Diclofenac (Voltaren)  Duloxetine (Cymbalta)  Esomeprazole (Nexium)  Ezetimibe (Zetia)  Fenofibrate (TriCor)  Fluticasone (Advair)  Hydrochlorothiazide (Hydrodiuril)  Multivitamin  Potassium (Klor-Con)  Pregabalin (Lyrica)  Salmeterol (Advair)  Sildenafil  Tamsulosin (Flomax)  Vitamin D3 ==================================================================== For clinical consultation, please call [phone]. ====================================================================       ROS  Constitutional: Denies any fever or chills Gastrointestinal: No reported hemesis, hematochezia, vomiting, or acute GI distress Musculoskeletal:  +LBP Neurological: No reported episodes of acute onset apraxia, aphasia, dysarthria, agnosia, amnesia, paralysis, loss of coordination, or loss of consciousness  Medication Review  Albuterol Sulfate,  DULoxetine, acetaminophen, aspirin EC, busPIRone, cholecalciferol, clobetasol ointment, clotrimazole-betamethasone, diclofenac sodium, esomeprazole, ezetimibe, fenofibrate, fluticasone-salmeterol, hydrochlorothiazide, meloxicam, multivitamin, oxyCODONE-acetaminophen, potassium chloride, potassium chloride SA, pregabalin, sildenafil, and tamsulosin  History Review  Allergy: [name] is allergic to niacin-lovastatin er, atorvastatin, other, simvastatin, and propoxyphene. Drug: [name]  reports no history of drug use. Alcohol:  reports no history of alcohol use. Tobacco:  reports that he quit smoking about 10 years ago. His smoking use included cigarettes. He started smoking about 55 years ago. He has a 67.5 pack-year smoking history. He has never used smokeless tobacco. Social: [name]  reports that he quit smoking about 10 years ago. His smoking use included cigarettes. He started smoking about 55 years ago. He has a 67.5 pack-year smoking history. He has never used smokeless tobacco. He reports that he does not drink alcohol and does not use drugs. Medical:  has a past medical history of Anxiety, Asthma, Benign essential HTN (06/24/2015), BPH (benign prostatic hyperplasia), COPD (chronic obstructive pulmonary disease) (HCC), Coronary artery disease (02/16/2014), DDD (degenerative disc disease), cervical, Degenerative disc disease, cervical (01/22/2017), Depression, DJD (degenerative joint disease), Emphysema of lung (HCC), Heel pain (10/10/2017), Hyperglobulinemia, Lumbar degenerative disc disease (01/22/2017), Major depression in remission (HCC) (03/10/2014), Migraines, Mitral regurgitation, Mixed hyperlipidemia (02/16/2014), Neck pain (01/24/2017), OSA (obstructive sleep apnea) (07/05/2016), Osteoarthritis of knee (08/12/2014), Prostate cancer (HCC) (2019), RA (rheumatoid arthritis) (HCC), Spinal stenosis of lumbar region with neurogenic claudication (04/18/2016), Spondylosis without myelopathy or  radiculopathy, lumbar region (01/22/2017), and Venous insufficiency of both lower extremities (11/07/2016). Surgical: [name]  has a past surgical history that includes Cholecystectomy; Knee surgery (Left); deviated septum repair; Cardiac catheterization; Colonoscopy; Nasal septum surgery; Colonoscopy with propofol (N/A, 01/09/2018); and Esophagogastroduodenoscopy (egd) with propofol (N/A, 01/09/2018). Family: family history includes Heart disease in his mother.  Laboratory Chemistry Profile   Renal Lab Results  Component Value Date   BUN 25 (H) 06/12/2022   CREATININE 0.91 06/12/2022   GFRNONAA >60 06/12/2022    Hepatic No results found for: "AST", "ALT", "ALBUMIN", "ALKPHOS", "HCVAB", "AMYLASE", "LIPASE", "AMMONIA"  Electrolytes Lab Results  Component Value Date   NA 136 06/12/2022   K 4.2 06/12/2022   CL 103 06/12/2022   CALCIUM 9.5 06/12/2022    Bone Lab Results  Component Value Date   TESTOSTERONE <3 (L) 04/29/2018    Inflammation (CRP: Acute Phase) (ESR: Chronic Phase) No results found for: "CRP", "ESRSEDRATE", "LATICACIDVEN"       Note: Above Lab results reviewed.   Physical Exam  General appearance: Well nourished, well developed, and well hydrated. In no apparent acute distress Mental status: Alert, oriented x 3 (person, place, & time)  Respiratory: No evidence of acute respiratory distress Eyes: PERLA Vitals: BP (!) 139/99   Pulse 92   Temp 98.2 F (36.8 C)   Resp 16   Ht 5' 6.5" (1.689 m)   Wt 213 lb (96.6 kg)   SpO2 99%   BMI 33.86 kg/m  BMI: Estimated body mass index is 33.86 kg/m as calculated from the following:   Height as of this encounter: 5' 6.5" (1.689 m).   Weight as of this encounter: 213 lb (96.6 kg). Ideal: Ideal body weight: 64.9 kg (143 lb 3 oz) Adjusted ideal body weight: 77.6 kg (171 lb 1.8 oz)  Assessment   Diagnosis Status  1. Chronic radicular lumbar pain   2. Spinal stenosis of lumbar region with neurogenic claudication    3. Neuroforaminal stenosis of lumbar spine    Controlled Controlled Controlled   Updated Problems: No problems updated.  Plan of Care  Problem-specific:  Assessment and Plan    Chronic Pain Duloxetine and pregabalin manage his chronic pain effectively, with pregabalin showing particular efficacy. We discussed his adherence to the medication regimen and potential side effects, including dizziness and drowsiness. We will refill duloxetine and pregabalin for one year.  Opioid Use He uses oxycodone for pain management. We confirmed the count of oxycodone tablets and discussed the risks of dependency and overdose. We emphasized the importance of adhering to the prescribed dosage and avoiding illicit substances. We will refill oxycodone as needed.  Follow-up We will schedule a follow-up visit in three months.       Gary [name] has a current medication list which includes the following long-term medication(s): albuterol sulfate, duloxetine, esomeprazole, ezetimibe, fenofibrate, hydrochlorothiazide, potassium chloride, pregabalin, pregabalin, and fluticasone-salmeterol.  Pharmacotherapy (Medications Ordered): Meds ordered this encounter  Medications   oxyCODONE-acetaminophen (PERCOCET) 10-325 MG tablet    Sig: Take 1 tablet by mouth every 8 (eight) hours as needed for pain.    Dispense:  90 tablet    Refill:  0   oxyCODONE-acetaminophen (PERCOCET) 10-325 MG tablet    Sig: Take 1 tablet by mouth every 8 (eight) hours as needed for pain.    Dispense:  90 tablet    Refill:  0   oxyCODONE-acetaminophen (PERCOCET) 10-325 MG tablet    Sig: Take 1 tablet by mouth every 8 (eight) hours as needed for pain.    Dispense:  90 tablet    Refill:  0   Orders:  No orders of the defined types were placed in this encounter.  Follow-up plan:   Return in about 3 months (around 06/11/2023) for MM, F2F.      s/p b/l SI-J 12/02/2018- not helpful,  lumbar facets L3-S1 b/l; consider sprint  peripheral nerve stimulation of lumbar medial branch lumbar facet arthropathy.           Recent Visits Date Type Provider Dept  02/19/23 Procedure visit Edward Jolly, MD [facility]  01/22/23 Office Visit Edward Jolly, MD [facility]  12/14/22 Office Visit Edward Jolly, MD [facility]  Showing recent visits within past 90 days and meeting all other requirements Today's Visits Date Type Provider Dept  03/13/23 Office Visit Edward Jolly, MD [facility]  Showing today's visits and meeting all other requirements Future Appointments Date Type Provider Dept  06/05/23 Appointment Edward Jolly, MD [facility]  Showing future appointments within next 90 days and meeting all other requirements  I discussed the assessment and treatment plan with the patient. The patient was  provided an opportunity to ask questions and all were answered. The patient agreed with the plan and demonstrated an understanding of the instructions.  Patient advised to call back or seek an in-person evaluation if the symptoms or condition worsens.  Duration of encounter: .  Total time on encounter, as per AMA guidelines included both the face-to-face and non-face-to-face time personally spent by the physician and/or other qualified health care professional(s) on the day of the encounter (includes time in activities that require the physician or other qualified health care professional and does not include time in activities normally performed by clinical staff). Physician's time may include the following activities when performed: Preparing to see the patient (e.g., pre-charting review of records, searching for previously ordered imaging, lab work, and nerve conduction tests) Review of prior analgesic pharmacotherapies. Reviewing PMP Interpreting ordered tests (e.g., lab work, imaging, nerve conduction tests) Performing post-procedure evaluations, including  interpretation of diagnostic procedures Obtaining and/or reviewing separately obtained history Performing a medically appropriate examination and/or evaluation Counseling and educating the patient/family/caregiver Ordering medications, tests, or procedures Referring and communicating with other health care professionals (when not separately reported) Documenting clinical information in the electronic or other health record Independently interpreting results (not separately reported) and communicating results to the patient/ family/caregiver Care coordination (not separately reported)  Note by: Edward Jolly, MD Date: 03/13/2023; Time: 1:43 PM

## 2023-03-20 ENCOUNTER — Other Ambulatory Visit: Payer: Self-pay

## 2023-03-23 ENCOUNTER — Other Ambulatory Visit: Payer: Self-pay | Admitting: Student in an Organized Health Care Education/Training Program

## 2023-03-23 DIAGNOSIS — G894 Chronic pain syndrome: Secondary | ICD-10-CM

## 2023-06-05 ENCOUNTER — Ambulatory Visit
Payer: Medicare HMO | Attending: Student in an Organized Health Care Education/Training Program | Admitting: Student in an Organized Health Care Education/Training Program

## 2023-06-05 ENCOUNTER — Encounter: Payer: Self-pay | Admitting: Student in an Organized Health Care Education/Training Program

## 2023-06-05 VITALS — BP 109/80 | HR 87 | Temp 97.2°F | Resp 17 | Ht 67.0 in | Wt 213.0 lb

## 2023-06-05 DIAGNOSIS — G894 Chronic pain syndrome: Secondary | ICD-10-CM | POA: Diagnosis present

## 2023-06-05 DIAGNOSIS — M48061 Spinal stenosis, lumbar region without neurogenic claudication: Secondary | ICD-10-CM | POA: Diagnosis present

## 2023-06-05 DIAGNOSIS — M47816 Spondylosis without myelopathy or radiculopathy, lumbar region: Secondary | ICD-10-CM

## 2023-06-05 DIAGNOSIS — M79641 Pain in right hand: Secondary | ICD-10-CM

## 2023-06-05 DIAGNOSIS — M5416 Radiculopathy, lumbar region: Secondary | ICD-10-CM | POA: Insufficient documentation

## 2023-06-05 DIAGNOSIS — M48062 Spinal stenosis, lumbar region with neurogenic claudication: Secondary | ICD-10-CM

## 2023-06-05 DIAGNOSIS — G8929 Other chronic pain: Secondary | ICD-10-CM | POA: Diagnosis present

## 2023-06-05 MED ORDER — OXYCODONE-ACETAMINOPHEN 10-325 MG PO TABS: 1.0000 | ORAL_TABLET | Freq: Three times a day (TID) | ORAL | 0 refills | Status: AC | PRN

## 2023-06-05 MED ORDER — DULOXETINE HCL 60 MG PO CPEP: 60.0000 mg | ORAL_CAPSULE | Freq: Every day | ORAL | 11 refills | Status: AC

## 2023-06-05 MED ORDER — PREGABALIN 75 MG PO CAPS
75.0000 mg | ORAL_CAPSULE | Freq: Every evening | ORAL | 2 refills | Status: DC | PRN
Start: 2023-07-04 — End: 2023-10-29

## 2023-06-05 NOTE — Progress Notes (Signed)
 PROVIDER NOTE: Information contained herein reflects review and annotations entered in association with encounter. Interpretation of such information and data should be left to medically-trained personnel. Information provided to patient can be located elsewhere in the medical record under "Patient Instructions". Document created using STT-dictation technology, any transcriptional errors that may result from process are unintentional.    Patient: Gary [name]  Service Category: E/M  Provider: Edward Jolly, MD  DOB: 1950/07/30  DOS: 06/05/2023  Referring Provider: Lauro Regulus, MD  MRN: [id number]  Specialty: Interventional Pain Management  PCP: Lauro Regulus, MD  Type: Established Patient  Setting: Ambulatory outpatient    Location: Office  Delivery: Face-to-face     HPI  [name], a 73 y.o. year old male, is here today because of his Chronic radicular lumbar pain [M54.16, G89.29]. Mr. Zarcone primary complain today is Back Pain (lower) and Pain (NEW: Hands, hips bilat (LEFT HIP WORSE))  Pertinent problems: Mr. Gadd has Lumbar spondylosis; Lumbar degenerative disc disease; Degenerative disc disease, cervical; Chronic pain syndrome; Facet arthropathy, lumbar; Spinal stenosis of lumbar region with neurogenic claudication; Chronic bilateral low back pain with left-sided sciatica; Chronic left sacroiliac joint pain; Chronic hip pain, left; Cervical spondylosis with radiculopathy; Groin pain, chronic, right; and Chronic left shoulder pain on their pertinent problem list. Pain Assessment: Severity of Chronic pain is reported as a 7 /10. Location: Back Lower/to hips bilat to calves bilat. Onset: More than a month ago. Quality: Aching. Timing: Constant. Modifying factor(s): meds, heating pad. Vitals:  height is 5\' 7"  (1.702 m) and weight is 213 lb (96.6 kg). His temperature is 97.2 F (36.2 C) (abnormal). His blood pressure is 109/80 and his pulse is 87. His respiration is 17  and oxygen saturation is 98%.  BMI: Estimated body mass index is 33.36 kg/m as calculated from the following:   Height as of this encounter: 5\' 7"  (1.702 m).   Weight as of this encounter: 213 lb (96.6 kg). Last encounter: 03/13/23 Last procedure: 02/19/2023.  Reason for encounter: medication management.  Discussed the use of AI scribe software for clinical note transcription with the patient, who gave verbal consent to proceed.  History of Present Illness   Gary [name] "Gary [name]" is a 73 year old male who presents with pain management concerns.  He experiences severe left hip pain described as 'raw', which is significant enough to disturb his sleep. The pain is a primary concern and he is seeking further evaluation for potential underlying causes.  He notes swelling in his hands and is concerned about the possibility of rheumatoid arthritis. He is scheduled to see a rheumatologist in [facility] for further evaluation of this issue.  There was a recent disruption in his medication schedule due to personal circumstances, specifically the passing of his sister. During this period, he took more medication than prescribed. He is currently taking Lyrica 75 mg at bedtime and has been taken off buspirone by another physician.  He is experiencing grief following the loss of his sister and is navigating through the bereavement process.      Pharmacotherapy Assessment  Analgesic:  Percocet 10 mg TID prn, #90/month, MME=45   Monitoring:  PMP: PDMP reviewed during this encounter.       Pharmacotherapy: No side-effects or adverse reactions reported. Compliance: No problems identified. Effectiveness: Clinically acceptable.  Nonah Mattes, RN  06/05/2023 11:55 AM  Sign when Signing Visit Nursing Pain Medication Assessment:  Safety precautions to be maintained throughout the outpatient stay  will include: orient to surroundings, keep bed in low position, maintain call bell within reach at all  times, provide assistance with transfer out of bed and ambulation.  Medication Inspection Compliance: Pill count conducted under aseptic conditions, in front of the patient. Neither the pills nor the bottle was removed from the patient's sight at any time. Once count was completed pills were immediately returned to the patient in their original bottle.  Medication: Oxycodone/APAP Pill/Patch Count:  29 of 90 pills remain Pill/Patch Appearance: Markings consistent with prescribed medication Bottle Appearance: Standard pharmacy container. Clearly labeled. Filled Date: 02 / 08 / 2025 Last Medication intake:  Today  No results found for: "CBDTHCR" No results found for: "D8THCCBX" No results found for: "D9THCCBX"  UDS:  Summary  Date Value Ref Range Status  09/21/2022 Note  Final    Comment:    ==================================================================== [employer] [facility] 13 (MW) ==================================================================== Test                             Result       Flag       Units  Drug Present and Declared for Prescription Verification   Oxycodone                      2468         EXPECTED   ng/mg creat   Oxymorphone                    1593         EXPECTED   ng/mg creat   Noroxycodone                   2677         EXPECTED   ng/mg creat   Noroxymorphone                 572          EXPECTED   ng/mg creat    Sources of oxycodone are scheduled prescription medications.    Oxymorphone, noroxycodone, and noroxymorphone are expected    metabolites of oxycodone. Oxymorphone is also available as a    scheduled prescription medication.  ==================================================================== Test                      Result    Flag   Units      Ref Range   Creatinine              98               mg/dL      >=16 ==================================================================== Declared Medications:  The flagging and interpretation on this  report are based on the  following declared medications.  Unexpected results may arise from  inaccuracies in the declared medications.   **Note: The testing scope of this panel includes these medications:   Oxycodone (Percocet)   **Note: The testing scope of this panel does not include the  following reported medications:   Acetaminophen (Tylenol)  Acetaminophen (Percocet)  Albuterol (Proair HFA)  Betamethasone (Lotrisone)  Buspirone (Buspar)  Clobetasol (Temovate)  Clotrimazole (Lotrisone)  Diclofenac (Voltaren)  Duloxetine (Cymbalta)  Esomeprazole (Nexium)  Ezetimibe (Zetia)  Fenofibrate (TriCor)  Fluticasone (Advair)  Hydrochlorothiazide (Hydrodiuril)  Multivitamin  Potassium (Klor-Con)  Pregabalin (Lyrica)  Salmeterol (Advair)  Sildenafil  Tamsulosin (Flomax)  Vitamin D3 ==================================================================== For clinical consultation, please call [phone]. ====================================================================  ROS  Constitutional: Denies any fever or chills Gastrointestinal: No reported hemesis, hematochezia, vomiting, or acute GI distress Musculoskeletal:  +LBP Neurological: No reported episodes of acute onset apraxia, aphasia, dysarthria, agnosia, amnesia, paralysis, loss of coordination, or loss of consciousness  Medication Review  Albuterol Sulfate, DULoxetine, acetaminophen, aspirin EC, cholecalciferol, clobetasol ointment, clotrimazole-betamethasone, diclofenac sodium, esomeprazole, ezetimibe, fenofibrate, fluticasone-salmeterol, hydrochlorothiazide, multivitamin, oxyCODONE-acetaminophen, potassium chloride SA, pregabalin, sildenafil, and tamsulosin  History Review  Allergy: Mr. Phenix is allergic to niacin-lovastatin er, atorvastatin, other, simvastatin, and propoxyphene. Drug: Mr. Mckowen  reports no history of drug use. Alcohol:  reports no history of alcohol use. Tobacco:  reports that he quit  smoking about 11 years ago. His smoking use included cigarettes. He started smoking about 56 years ago. He has a 67.5 pack-year smoking history. He has never used smokeless tobacco. Social: Mr. Vanpatten  reports that he quit smoking about 11 years ago. His smoking use included cigarettes. He started smoking about 56 years ago. He has a 67.5 pack-year smoking history. He has never used smokeless tobacco. He reports that he does not drink alcohol and does not use drugs. Medical:  has a past medical history of Anxiety, Asthma, Benign essential HTN (06/24/2015), BPH (benign prostatic hyperplasia), COPD (chronic obstructive pulmonary disease) (HCC), Coronary artery disease (02/16/2014), DDD (degenerative disc disease), cervical, Degenerative disc disease, cervical (01/22/2017), Depression, DJD (degenerative joint disease), Emphysema of lung (HCC), Heel pain (10/10/2017), Hyperglobulinemia, Lumbar degenerative disc disease (01/22/2017), Major depression in remission (HCC) (03/10/2014), Migraines, Mitral regurgitation, Mixed hyperlipidemia (02/16/2014), Neck pain (01/24/2017), OSA (obstructive sleep apnea) (07/05/2016), Osteoarthritis of knee (08/12/2014), Prostate cancer (HCC) (2019), RA (rheumatoid arthritis) (HCC), Spinal stenosis of lumbar region with neurogenic claudication (04/18/2016), Spondylosis without myelopathy or radiculopathy, lumbar region (01/22/2017), and Venous insufficiency of both lower extremities (11/07/2016). Surgical: Mr. Kutzer  has a past surgical history that includes Cholecystectomy; Knee surgery (Left); deviated septum repair; Cardiac catheterization; Colonoscopy; Nasal septum surgery; Colonoscopy with propofol (N/A, 01/09/2018); and Esophagogastroduodenoscopy (egd) with propofol (N/A, 01/09/2018). Family: family history includes Heart disease in his mother.  Laboratory Chemistry Profile   Renal Lab Results  Component Value Date   BUN 25 (H) 06/12/2022   CREATININE 0.91 06/12/2022   GFRNONAA >60  06/12/2022    Hepatic No results found for: "AST", "ALT", "ALBUMIN", "ALKPHOS", "HCVAB", "AMYLASE", "LIPASE", "AMMONIA"  Electrolytes Lab Results  Component Value Date   NA 136 06/12/2022   K 4.2 06/12/2022   CL 103 06/12/2022   CALCIUM 9.5 06/12/2022    Bone Lab Results  Component Value Date   TESTOSTERONE <3 (L) 04/29/2018    Inflammation (CRP: Acute Phase) (ESR: Chronic Phase) No results found for: "CRP", "ESRSEDRATE", "LATICACIDVEN"       Note: Above Lab results reviewed.   Physical Exam  General appearance: Well nourished, well developed, and well hydrated. In no apparent acute distress Mental status: Alert, oriented x 3 (person, place, & time)       Respiratory: No evidence of acute respiratory distress Eyes: PERLA Vitals: BP 109/80   Pulse 87   Temp (!) 97.2 F (36.2 C)   Resp 17   Ht 5\' 7"  (1.702 m)   Wt 213 lb (96.6 kg)   SpO2 98%   BMI 33.36 kg/m  BMI: Estimated body mass index is 33.36 kg/m as calculated from the following:   Height as of this encounter: 5\' 7"  (1.702 m).   Weight as of this encounter: 213 lb (96.6 kg). Ideal: Ideal body weight: 66.1 kg (145 lb  11.6 oz) Adjusted ideal body weight: 78.3 kg (172 lb 10.1 oz)  5 out of 5 strength bilateral lower extremity: Plantar flexion, dorsiflexion, knee flexion, knee extension.   Assessment   Diagnosis Status  1. Chronic radicular lumbar pain   2. Spinal stenosis of lumbar region with neurogenic claudication   3. Neuroforaminal stenosis of lumbar spine   4. Lumbar spondylosis   5. Right hand pain   6. Chronic pain syndrome    Controlled Controlled Controlled   Updated Problems: No problems updated.  Plan of Care  Problem-specific:  Assessment and Plan    Chronic Pain Chronic pain in the left hip disrupts sleep. Currently managed with pain medication and awaiting rheumatologist evaluation for possible rheumatoid arthritis. Discussed the importance of medication adherence and the impact of  missed doses on pain control. Concerns about hand swelling and potential rheumatoid arthritis were expressed. Fax notes to the rheumatologist in [place]. Refill Lyrica 75 mg for bedtime use. Confirm remaining prescription at the pharmacy and send in two new prescriptions with refill dates around April 9.  Grief and Bereavement Experiencing significant grief after the recent loss of a sister.  Discussed the option of short-term medication for managing grief symptoms if needed.  Follow-up Schedule a follow-up appointment in July.       Mr. DEMITRIUS CRASS has a current medication list which includes the following long-term medication(s): albuterol sulfate, esomeprazole, ezetimibe, fenofibrate, fluticasone-salmeterol, hydrochlorothiazide, duloxetine, and [START ON 07/04/2023] pregabalin.  Pharmacotherapy (Medications Ordered): Meds ordered this encounter  Medications   oxyCODONE-acetaminophen (PERCOCET) 10-325 MG tablet    Sig: Take 1 tablet by mouth every 8 (eight) hours as needed for pain.    Dispense:  90 tablet    Refill:  0   oxyCODONE-acetaminophen (PERCOCET) 10-325 MG tablet    Sig: Take 1 tablet by mouth every 8 (eight) hours as needed for pain.    Dispense:  90 tablet    Refill:  0   oxyCODONE-acetaminophen (PERCOCET) 10-325 MG tablet    Sig: Take 1 tablet by mouth every 8 (eight) hours as needed for pain.    Dispense:  90 tablet    Refill:  0   DULoxetine (CYMBALTA) 60 MG capsule    Sig: Take 1 capsule (60 mg total) by mouth daily.    Dispense:  30 capsule    Refill:  11   pregabalin (LYRICA) 75 MG capsule    Sig: Take 1 capsule (75 mg total) by mouth at bedtime as needed.    Dispense:  90 capsule    Refill:  2    Fill one day early if pharmacy is closed on scheduled refill date. May substitute for generic if available.   Orders:  No orders of the defined types were placed in this encounter.  Follow-up plan:   Return in about 4 months (around 10/10/2023) for MM, F2F.       s/p b/l SI-J 12/02/2018- not helpful,  lumbar facets L3-S1 b/l; consider sprint peripheral nerve stimulation of lumbar medial branch lumbar facet arthropathy.           Recent Visits Date Type Provider Dept  03/13/23 Office Visit Edward Jolly, MD [facility]  Showing recent visits within past 90 days and meeting all other requirements Today's Visits Date Type Provider Dept  06/05/23 Office Visit Edward Jolly, MD [facility]  Showing today's visits and meeting all other requirements Future Appointments No visits were found meeting these conditions. Showing future appointments within  next 90 days and meeting all other requirements  I discussed the assessment and treatment plan with the patient. The patient was provided an opportunity to ask questions and all were answered. The patient agreed with the plan and demonstrated an understanding of the instructions.  Patient advised to call back or seek an in-person evaluation if the symptoms or condition worsens.  Duration of encounter: .  Total time on encounter, as per AMA guidelines included both the face-to-face and non-face-to-face time personally spent by the physician and/or other qualified health care professional(s) on the day of the encounter (includes time in activities that require the physician or other qualified health care professional and does not include time in activities normally performed by clinical staff). Physician's time may include the following activities when performed: Preparing to see the patient (e.g., pre-charting review of records, searching for previously ordered imaging, lab work, and nerve conduction tests) Review of prior analgesic pharmacotherapies. Reviewing PMP Interpreting ordered tests (e.g., lab work, imaging, nerve conduction tests) Performing post-procedure evaluations, including interpretation of diagnostic procedures Obtaining and/or reviewing separately obtained  history Performing a medically appropriate examination and/or evaluation Counseling and educating the patient/family/caregiver Ordering medications, tests, or procedures Referring and communicating with other health care professionals (when not separately reported) Documenting clinical information in the electronic or other health record Independently interpreting results (not separately reported) and communicating results to the patient/ family/caregiver Care coordination (not separately reported)  Note by: Edward Jolly, MD Date: 06/05/2023; Time: 1:56 PM

## 2023-06-05 NOTE — Patient Instructions (Signed)

## 2023-06-05 NOTE — Progress Notes (Signed)
 Nursing Pain Medication Assessment:  Safety precautions to be maintained throughout the outpatient stay will include: orient to surroundings, keep bed in low position, maintain call bell within reach at all times, provide assistance with transfer out of bed and ambulation.  Medication Inspection Compliance: Pill count conducted under aseptic conditions, in front of the patient. Neither the pills nor the bottle was removed from the patient's sight at any time. Once count was completed pills were immediately returned to the patient in their original bottle.  Medication: Oxycodone/APAP Pill/Patch Count:  29 of 90 pills remain Pill/Patch Appearance: Markings consistent with prescribed medication Bottle Appearance: Standard pharmacy container. Clearly labeled. Filled Date: 02 / 08 / 2025 Last Medication intake:  Today

## 2023-06-25 ENCOUNTER — Other Ambulatory Visit: Payer: Self-pay | Admitting: Radiation Oncology

## 2023-07-19 ENCOUNTER — Ambulatory Visit: Admitting: Sleep Medicine

## 2023-07-19 ENCOUNTER — Encounter: Payer: Self-pay | Admitting: Sleep Medicine

## 2023-07-19 ENCOUNTER — Encounter: Payer: Self-pay | Admitting: Student in an Organized Health Care Education/Training Program

## 2023-07-19 ENCOUNTER — Ambulatory Visit
Attending: Student in an Organized Health Care Education/Training Program | Admitting: Student in an Organized Health Care Education/Training Program

## 2023-07-19 VITALS — BP 118/78 | HR 83 | Temp 97.1°F | Ht 67.0 in | Wt 215.6 lb

## 2023-07-19 VITALS — BP 131/50 | HR 72 | Temp 97.7°F | Resp 16 | Ht 67.0 in | Wt 215.0 lb

## 2023-07-19 DIAGNOSIS — G4733 Obstructive sleep apnea (adult) (pediatric): Secondary | ICD-10-CM

## 2023-07-19 DIAGNOSIS — M533 Sacrococcygeal disorders, not elsewhere classified: Secondary | ICD-10-CM

## 2023-07-19 DIAGNOSIS — K219 Gastro-esophageal reflux disease without esophagitis: Secondary | ICD-10-CM

## 2023-07-19 DIAGNOSIS — M1612 Unilateral primary osteoarthritis, left hip: Secondary | ICD-10-CM | POA: Diagnosis not present

## 2023-07-19 DIAGNOSIS — I10 Essential (primary) hypertension: Secondary | ICD-10-CM

## 2023-07-19 DIAGNOSIS — E669 Obesity, unspecified: Secondary | ICD-10-CM | POA: Diagnosis not present

## 2023-07-19 NOTE — Progress Notes (Signed)
 PROVIDER NOTE: Interpretation of information contained herein should be left to medically-trained personnel. Specific patient instructions are provided elsewhere under "Patient Instructions" section of medical record. This document was created in part using AI and STT-dictation technology, any transcriptional errors that may result from this process are unintentional.  Patient: Gary [name]  Service: E/M   PCP: Lauro Regulus, MD  DOB: 1950-09-15  DOS: 07/19/2023  Provider: Edward Jolly, MD  MRN: [id number]  Delivery: Face-to-face  Specialty: Interventional Pain Management  Type: Established Patient  Setting: Ambulatory outpatient facility  Specialty designation: 09  Referring Prov.: Lauro Regulus, MD  Location: Outpatient office facility       HPI  [name], a 73 y.o. year old male, is here today because of his Primary osteoarthritis of left hip [M16.12]. [name] primary complain today is Hip Pain (left)  Pertinent problems: [name] has Lumbar spondylosis; Lumbar degenerative disc disease; Degenerative disc disease, cervical; Chronic pain syndrome; Facet arthropathy, lumbar; Spinal stenosis of lumbar region with neurogenic claudication; Chronic bilateral low back pain with left-sided sciatica; Chronic left sacroiliac joint pain; Chronic hip pain, left; Cervical spondylosis with radiculopathy; Groin pain, chronic, right; and Chronic left shoulder pain on their pertinent problem list. Pain Assessment: Severity of Chronic pain is reported as a 8 /10. Location: Hip Left/to left thigh. Onset: More than a month ago. Quality: Aching. Timing: Constant. Modifying factor(s): meds, heating pad. Vitals:  height is 5\' 7"  (1.702 m) and weight is 215 lb (97.5 kg). His temperature is 97.7 F (36.5 C). His blood pressure is 131/50 (abnormal) and his pulse is 72. His respiration is 16 and oxygen saturation is 97%.  BMI: Estimated body mass index is 33.67 kg/m as calculated from  the following:   Height as of this encounter: 5\' 7"  (1.702 m).   Weight as of this encounter: 215 lb (97.5 kg). Last encounter: 06/05/2023. Last procedure: 02/19/2023.  Reason for encounter:   Patient presents today with increased left hip pain and left buttock pain overlying his SI joint.  He states that the pain is getting worse and is impacting his ability to walk.  He saw a rheumatology a couple of weeks ago who also repeated x-rays and confirmed SI joint arthritis and left hip osteoarthritis.  She would like for him to have hip and SI joint injections here which she is interested in proceeding with.  ROS  Constitutional: Denies any fever or chills Gastrointestinal: No reported hemesis, hematochezia, vomiting, or acute GI distress Musculoskeletal:  Left hip and left SI joint pain Neurological: No reported episodes of acute onset apraxia, aphasia, dysarthria, agnosia, amnesia, paralysis, loss of coordination, or loss of consciousness  Medication Review  Albuterol Sulfate, DULoxetine, acetaminophen, aspirin EC, cholecalciferol, clobetasol ointment, clotrimazole-betamethasone, diclofenac sodium, esomeprazole, ezetimibe, fenofibrate, fluticasone-salmeterol, hydrochlorothiazide, multivitamin, oxyCODONE-acetaminophen, potassium chloride SA, pregabalin, sildenafil, and tamsulosin  History Review  Allergy: [name] is allergic to niacin-lovastatin er, atorvastatin, other, simvastatin, and propoxyphene. Drug: [name]  reports no history of drug use. Alcohol:  reports no history of alcohol use. Tobacco:  reports that he quit smoking about 11 years ago. His smoking use included cigarettes. He started smoking about 56 years ago. He has a 67.5 pack-year smoking history. He has never used smokeless tobacco. Social: [name]  reports that he quit smoking about 11 years ago. His smoking use included cigarettes. He started smoking about 56 years ago. He has a 67.5 pack-year smoking history. He  has never used smokeless  tobacco. He reports that he does not drink alcohol and does not use drugs. Medical:  has a past medical history of Anxiety, Asthma, Benign essential HTN (06/24/2015), BPH (benign prostatic hyperplasia), COPD (chronic obstructive pulmonary disease) (HCC), Coronary artery disease (02/16/2014), DDD (degenerative disc disease), cervical, Degenerative disc disease, cervical (01/22/2017), Depression, DJD (degenerative joint disease), Emphysema of lung (HCC), Heel pain (10/10/2017), Hyperglobulinemia, Lumbar degenerative disc disease (01/22/2017), Major depression in remission (HCC) (03/10/2014), Migraines, Mitral regurgitation, Mixed hyperlipidemia (02/16/2014), Neck pain (01/24/2017), OSA (obstructive sleep apnea) (07/05/2016), Osteoarthritis of knee (08/12/2014), Prostate cancer (HCC) (2019), RA (rheumatoid arthritis) (HCC), Spinal stenosis of lumbar region with neurogenic claudication (04/18/2016), Spondylosis without myelopathy or radiculopathy, lumbar region (01/22/2017), and Venous insufficiency of both lower extremities (11/07/2016). Surgical: [name]  has a past surgical history that includes Cholecystectomy; Knee surgery (Left); deviated septum repair; Cardiac catheterization; Colonoscopy; Nasal septum surgery; Colonoscopy with propofol (N/A, 01/09/2018); and Esophagogastroduodenoscopy (egd) with propofol (N/A, 01/09/2018). Family: family history includes Heart disease in his mother.  Laboratory Chemistry Profile   Renal Lab Results  Component Value Date   BUN 25 (H) 06/12/2022   CREATININE 0.91 06/12/2022   GFRNONAA >60 06/12/2022    Hepatic No results found for: "AST", "ALT", "ALBUMIN", "ALKPHOS", "HCVAB", "AMYLASE", "LIPASE", "AMMONIA"  Electrolytes Lab Results  Component Value Date   NA 136 06/12/2022   K 4.2 06/12/2022   CL 103 06/12/2022   CALCIUM 9.5 06/12/2022    Bone Lab Results  Component Value Date   TESTOSTERONE <3 (L) 04/29/2018    Inflammation (CRP: Acute  Phase) (ESR: Chronic Phase) No results found for: "CRP", "ESRSEDRATE", "LATICACIDVEN"       Note: Above Lab results reviewed.  Recent Imaging Review  Left hip x-ray and SI joint  bilateral hips which reveal moderate degenerative  changes of both hips.  Left hip shows superior acetabular sclerosis with  medial and inferior joint space narrowing and a large inferior acetabular  osteophyte.  The right hip shows some lateral femoral head spurring as  well as some lateral acetabular spurring with sclerosis medial joint space  narrowing and inferior osteophyte on the acetabulum.  There is some  degenerative changes noted to the bilateral sacroiliac joints which is  partially visualized on the studies.  No fractures or dislocations noted.    Note: Reviewed        Physical Exam  General appearance: Well nourished, well developed, and well hydrated. In no apparent acute distress Mental status: Alert, oriented x 3 (person, place, & time)       Respiratory: No evidence of acute respiratory distress Eyes: PERLA Vitals: BP (!) 131/50   Pulse 72   Temp 97.7 F (36.5 C)   Resp 16   Ht 5\' 7"  (1.702 m)   Wt 215 lb (97.5 kg)   SpO2 97%   BMI 33.67 kg/m  BMI: Estimated body mass index is 33.67 kg/m as calculated from the following:   Height as of this encounter: 5\' 7"  (1.702 m).   Weight as of this encounter: 215 lb (97.5 kg). Ideal: Ideal body weight: 66.1 kg (145 lb 11.6 oz) Adjusted ideal body weight: 78.7 kg (173 lb 6.9 oz)  Sacroiliac Joint (SIJ) Findings: Tenderness over the left SI joint (just medial to the PSIS) Positive FABER test (pain localized to the posterior SI joint, not groin) Positive Gaenslen's test - pain provoked with hip extension while contralateral hip is flexed Thigh thrust test - axial compression reproduces buttock pain Compression test - pain  reproduced with lateral pressure over the iliac crests  Hip Joint Findings: Tenderness to palpation over the anterior  hip/groin Pain with passive internal and external rotation of the left hip (especially when flexed to 90) Positive FADIR test (pain with flexion, adduction, internal rotation)   No significant leg length discrepancy or gross instability noted  Assessment   Diagnosis  1. Primary osteoarthritis of left hip   2. Sacroiliac joint pain (LEFT)      Updated Problems: Problem  Primary Osteoarthritis of Left Hip  Sacroiliac Joint Pain    Plan of Care  The patient presents with chronic left buttock and SI joint pain, along with left anterior hip pain, limiting mobility and functional activity. Physical exam  and Xray findings are suggestive of left sacroiliac joint dysfunction and intra-articular left hip pathology, likely contributing to the patient's symptoms.  Plan: Left SI Joint Injection under fluoroscopic guidance: Diagnostic and therapeutic intent Injection with local anesthetic and corticosteroid To assess for and reduce pain related to sacroiliac joint dysfunction  Left Intra-articular Hip Injection under fluoroscopic guidance: To evaluate and manage intra-articular hip pathology Diagnostic benefit in distinguishing hip from referred lumbar or SIJ pain  Post-procedure follow-up to assess the response to both injections:  Orders:  Orders Placed This Encounter  Procedures   HIP INJECTION    Standing Status:   Future    Expiration Date:   10/18/2023    Scheduling Instructions:     Procedure: Intra-articular Hip joint injection     Side: LEFT     Approach: Lateral     Sedation: without     Timeframe: As soon as schedule allows   SACROILIAC JOINT INJECTION    Physical Examination Findings: Positive Sacral Thrust (Sacral Spring, Downward Pressure): (Y) Positive FABER maneuver (Patrick's): (Y) Positive SI distraction (Gapping): (Y) Positive SI compression (Approximation): (Y) Positive Thigh Thrust:  (Y) Positive Gaenslen's: (Y) Positive Sacral Sulcus Tenderness: (Y)     Standing Status:   Future    Expiration Date:   10/18/2023    Scheduling Instructions:     Procedure: Sacroiliac Joint Injection     Side  Laterality: LEFT     Sedation: Patient's choice.     Timeframe: As soon as schedule allows.    Where will this procedure be performed?:   [facility]   Follow-up plan:   Return in about 20 days (around 08/08/2023) for Left SIJ/piriformis TPI, Left Hip injection , in clinic NS.     s/p b/l SI-J 12/02/2018- not helpful,  lumbar facets L3-S1 b/l; consider sprint peripheral nerve stimulation of lumbar medial branch lumbar facet arthropathy.           Recent Visits Date Type Provider Dept  06/05/23 Office Visit Edward Jolly, MD [facility]  Showing recent visits within past 90 days and meeting all other requirements Today's Visits Date Type Provider Dept  07/19/23 Office Visit Edward Jolly, MD [facility]  Showing today's visits and meeting all other requirements Future Appointments Date Type Provider Dept  10/04/23 Appointment Edward Jolly, MD [facility]  Showing future appointments within next 90 days and meeting all other requirements  I discussed the assessment and treatment plan with the patient. The patient was provided an opportunity to ask questions and all were answered. The patient agreed with the plan and demonstrated an understanding of the instructions.  Patient advised to call back or seek an in-person evaluation if the symptoms or condition worsens.  Duration of encounter:  .  Total time on encounter, as per AMA guidelines included both the face-to-face and non-face-to-face time personally spent by the physician and/or other qualified health care professional(s) on the day of the encounter (includes time in activities that require the physician or other qualified health care professional and does not include time in activities normally performed by clinical staff). Physician's time may  include the following activities when performed: Preparing to see the patient (e.g., pre-charting review of records, searching for previously ordered imaging, lab work, and nerve conduction tests) Review of prior analgesic pharmacotherapies. Reviewing PMP Interpreting ordered tests (e.g., lab work, imaging, nerve conduction tests) Performing post-procedure evaluations, including interpretation of diagnostic procedures Obtaining and/or reviewing separately obtained history Performing a medically appropriate examination and/or evaluation Counseling and educating the patient/family/caregiver Ordering medications, tests, or procedures Referring and communicating with other health care professionals (when not separately reported) Documenting clinical information in the electronic or other health record Independently interpreting results (not separately reported) and communicating results to the patient/ family/caregiver Care coordination (not separately reported)  Note by: Edward Jolly, MD (TTS and AI technology used. I apologize for any typographical errors that were not detected and corrected.) Date: 07/19/2023; Time: 8:55 AM

## 2023-07-19 NOTE — Progress Notes (Signed)
 Safety precautions to be maintained throughout the outpatient stay will include: orient to surroundings, keep bed in low position, maintain call bell within reach at all times, provide assistance with transfer out of bed and ambulation.

## 2023-07-19 NOTE — Patient Instructions (Signed)

## 2023-07-19 NOTE — Progress Notes (Signed)
 Name:Armstead ZYMARION FAVORITE MRN: [id number] DOB: 26-Oct-1950   CHIEF COMPLAINT:  ESTABLISH CARE FOR OSA   HISTORY OF PRESENT ILLNESS:  [name] is a 73 y.o. w/ a h/o OSA, HTN, GERD and hyperlipidemia who presents to establish care for OSA. Patient was initially diagnosed with OSA around 2023 and subsequently started on CPAP therapy. Reports using CPAP therapy every night, which is confirmed by compliance data. He currently using the Airfit N30 nasal mask, which causes significant air leaks. Reports dry mouth from air leaks. Reports feeling unrefreshed upon awakening despite excellent CPAP compliance. Reports significant weight changes.   Reports nocturnal awakenings due to nocturia unclear reasons, however does not have difficulty falling back to sleep. Occasional morning headaches. Denies RLS symptoms, dream enactment, cataplexy, hypnagogic or hypnapompic hallucinations. Denies a family history of sleep apnea. Denies drowsy driving. Drinks 2 cups of coffee daily, occasional alcohol use, former smoker, denies illicit drug use.   Bedtime 11 pm-12 am Sleep onset 15 mins Rise time 8-10 am   EPWORTH SLEEP SCORE 10     No data to display          PAST MEDICAL HISTORY :   has a past medical history of Anxiety, Asthma, Benign essential HTN (06/24/2015), BPH (benign prostatic hyperplasia), COPD (chronic obstructive pulmonary disease) (HCC), Coronary artery disease (02/16/2014), DDD (degenerative disc disease), cervical, Degenerative disc disease, cervical (01/22/2017), Depression, DJD (degenerative joint disease), Emphysema of lung (HCC), Heel pain (10/10/2017), Hyperglobulinemia, Lumbar degenerative disc disease (01/22/2017), Major depression in remission (HCC) (03/10/2014), Migraines, Mitral regurgitation, Mixed hyperlipidemia (02/16/2014), Neck pain (01/24/2017), OSA (obstructive sleep apnea) (07/05/2016), Osteoarthritis of knee (08/12/2014), Prostate cancer (HCC) (2019), RA (rheumatoid  arthritis) (HCC), Spinal stenosis of lumbar region with neurogenic claudication (04/18/2016), Spondylosis without myelopathy or radiculopathy, lumbar region (01/22/2017), and Venous insufficiency of both lower extremities (11/07/2016).  has a past surgical history that includes Cholecystectomy; Knee surgery (Left); deviated septum repair; Cardiac catheterization; Colonoscopy; Nasal septum surgery; Colonoscopy with propofol (N/A, 01/09/2018); and Esophagogastroduodenoscopy (egd) with propofol (N/A, 01/09/2018). Prior to Admission medications   Medication Sig Start Date End Date Taking? Authorizing Provider  acetaminophen (TYLENOL) 500 MG tablet Take as needed by mouth.     [provider]  Albuterol Sulfate 108 (90 Base) MCG/ACT AEPB Inhale as needed into the lungs.     [provider]  aspirin EC 81 MG tablet Take 1 tablet by mouth daily. 12/27/22   [provider]  cholecalciferol (VITAMIN D) 1000 units tablet Take by mouth.    [provider]  clobetasol ointment (TEMOVATE) 0.05 % Apply 1 application topically 2 (two) times daily.    [provider]  clotrimazole-betamethasone (LOTRISONE) cream Apply 1 application topically 2 (two) times daily.    [provider]  diclofenac sodium (VOLTAREN) 1 % GEL Apply topically 4 (four) times daily.    [provider]  DULoxetine (CYMBALTA) 60 MG capsule Take 1 capsule (60 mg total) by mouth daily. 06/05/23 05/30/24  Edward Jolly, MD  esomeprazole (NEXIUM) 40 MG capsule Take 40 mg by mouth daily. 08/30/18   [provider]  ezetimibe (ZETIA) 10 MG tablet Take 10 mg by mouth daily.  08/21/17   [provider]  fenofibrate (TRICOR) 48 MG tablet Take 48 mg by mouth daily.    [provider]  fluticasone-salmeterol (ADVAIR) 100-50 MCG/ACT AEPB Inhale into the lungs. 05/31/21 07/19/23  [provider]  hydrochlorothiazide (HYDRODIURIL) 25 MG tablet  09/27/17   [provider]  Multiple Vitamin (MULTIVITAMIN) tablet Take 1 tablet by mouth daily.    [provider]  oxyCODONE-acetaminophen (PERCOCET) 10-325 MG tablet Take 1 tablet by mouth every 8 (eight) hours as needed for pain. 07/18/23 08/17/23  Edward Jolly, MD  oxyCODONE-acetaminophen (PERCOCET) 10-325 MG tablet Take 1 tablet by mouth every 8 (eight) hours as needed for pain. 08/17/23 09/16/23  Edward Jolly, MD  oxyCODONE-acetaminophen (PERCOCET) 10-325 MG tablet Take 1 tablet by mouth every 8 (eight) hours as needed for pain. 09/16/23 10/16/23  Edward Jolly, MD  potassium chloride SA (KLOR-CON M) 20 MEQ tablet Take 20 mEq by mouth daily. 03/27/22   [provider]  pregabalin (LYRICA) 75 MG capsule Take 1 capsule (75 mg total) by mouth at bedtime as needed. 07/04/23 03/30/24  Edward Jolly, MD  sildenafil (REVATIO) 20 MG tablet Take 3 to 5 tablets two hours before intercouse on an empty stomach.  Do not take with nitrates. 10/10/17   Michiel Cowboy A, PA-C  tamsulosin (FLOMAX) 0.4 MG CAPS capsule TAKE 1 CAPSULE EVERY DAY AFTER SUPPER 06/23/22   Carmina Miller, MD   Allergies  Allergen Reactions   Niacin-Lovastatin Er Other (See Comments)    unknown unknown   Atorvastatin Other (See Comments)   Other    Simvastatin Other (See Comments)   Propoxyphene Nausea Only    Passed out after taking it on an empty stomach    FAMILY HISTORY:  family history includes Heart disease in his mother. SOCIAL HISTORY:  reports that he quit smoking about 11 years ago. His smoking use included cigarettes. He started smoking about 56 years ago. He has a 67.5 pack-year smoking history. He has never used smokeless tobacco. He reports that he does not drink alcohol and does not use drugs.   Review of Systems:  Gen:  Denies  fever, sweats, chills weight loss  HEENT: Denies blurred vision, double vision, ear pain, eye pain, hearing loss, nose bleeds, sore throat Cardiac:  No dizziness, chest pain or  heaviness, chest tightness,edema, No JVD Resp:   No cough, -sputum production, -shortness of breath,-wheezing, -hemoptysis,  Gi: Denies swallowing difficulty, stomach pain, nausea or vomiting, diarrhea, constipation, bowel incontinence Gu:  Denies bladder incontinence, burning urine Ext:   Denies Joint pain, stiffness or swelling Skin: Denies  skin rash, easy bruising or bleeding or hives Endoc:  Denies polyuria, polydipsia , polyphagia or weight change Psych:   Denies depression, insomnia or hallucinations  Other:  All other systems negative  VITAL SIGNS: BP 118/78 (BP Location: Right Arm, Cuff Size: Large)   Pulse 83   Temp (!) 97.1 F (36.2 C)   Ht 5\' 7"  (1.702 m)   Wt 215 lb 9.6 oz (97.8 kg)   SpO2 96%   BMI 33.77 kg/m     Physical Examination:   General Appearance: No distress  EYES PERRLA, EOM intact.   NECK Supple, No JVD Pulmonary: normal breath sounds, No wheezing.  CardiovascularNormal S1,S2.  No m/r/g.   Abdomen: Benign, Soft, non-tender. Skin:   warm, no rashes, no ecchymosis  Extremities: normal, no cyanosis, clubbing. Neuro:without focal findings,  speech normal  PSYCHIATRIC: Mood, affect within normal limits.   ASSESSMENT AND PLAN  OSA Patient demonstrates excellent CPAP compliance, he is using and benefiting from CPAP therapy. Reviewed CPAP compliance with patient. For pressure discomfort, decreased APAP range to 5-12 cm H2O. Will also try patient on the Airfit F30i FFM, due to air leaks and mask discomfort. Discussed the consequences of  untreated sleep apnea. Advised not to drive drowsy for safety of patient and others. Will follow up in 2 months to review CPAP efficacy and compliance data.    HTN Stable, on current management. Following with PCP.   Obesity Counseled patient on diet and lifestyle modification.    Patient  satisfied with Plan of action and management. All questions answered  Follow up to review HST results and treatment plan.  I  spent a total of 45 minutes reviewing chart data, face-to-face evaluation with the patient, counseling and coordination of care as detailed above.    Tempie Hoist, M.D.  Sleep Medicine [facility]

## 2023-07-19 NOTE — Patient Instructions (Signed)

## 2023-08-01 ENCOUNTER — Ambulatory Visit: Admitting: Cardiology

## 2023-08-01 NOTE — Progress Notes (Deleted)
 Cardiology Office Note:  .   Date:  08/01/2023  ID:  Gary [name], DOB [birth year], MRN [id number] PCP: Jimmy Moulding, MD  [facility] Providers Cardiologist:  Constancia Delton, MD { Click to update primary MD,subspecialty MD or APP then REFRESH:1}   History of Present Illness: .   Gary [name] is a 73 y.o. male with past medical history of mixed hyperlipidemia, primary pretension, former smoker x 40+ years, obstructive sleep apnea, pulmonary emphysema, type 2 diabetes, mitral valve regurgitation, aortic atherosclerosis, venous insufficiency, coronary artery disease (coronary CTA with calcium score of 32.3), who is here today for follow-up of his coronary artery disease.   Previous Lexiscan Myoview completed in 2019 showed no ischemia and no infarct.  Echocardiogram in 2019 revealed an LVEF of 55%.  CT of the chest for lung cancer screening in 09/2021 showed left circumflex calcifications.  Coronary CTA completed 07/2022 revealed a coronary calcium score 32.3 which is 1 7th percentile for age and sex matched control.  Echocardiogram completed 07/2022 revealed LVEF 55 to 60%, no RWMA, right ventricular systolic function was normal with normal size, mild mitral regurgitation.   He was last seen in clinic 07/28/22 by Dr. Junnie Olives.  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  He had to lift her up very frequently which might have caused some muscle strain.  He had been having intermittent chest uneasy feeling with 10 episodes in the past 2 weeks.  Overall he states he was feeling well and has some feelings that are just different to the right side of his chest when she thought could possibly be muscle pain.  He was continued on his current medication regimen without any further testing ordered.  He returns to clinic today  ROS: 10 point review of system has been reviewed and considered negative except  what is been listed in the HPI  Studies Reviewed: .       2D echo 07/24/2022 1. Left ventricular ejection fraction, by estimation, is 55 to 60%. The  left ventricle has normal function. The left ventricle has no regional  wall motion abnormalities. Left ventricular diastolic parameters were  normal.   2. Right ventricular systolic function is normal. The right ventricular  size is normal.   3. The mitral valve is normal in structure. Mild mitral valve  regurgitation.   4. The aortic valve is tricuspid. Aortic valve regurgitation is not  visualized. Aortic valve sclerosis is present, with no evidence of aortic  valve stenosis.   5. The inferior vena cava is normal in size with greater than 50%  respiratory variability, suggesting right atrial pressure of 3 mmHg.   cCTA 07/24/2022 IMPRESSION: 1. Coronary calcium score of 32.3. This was 27th percentile for age and sex matched control.   2. Normal coronary origin with right dominance.   3. Minimal stenosis in proximal RCA and LCx (<25%).   4. CAD-RADS 1. Minimal non-obstructive CAD (0-24%). Consider non-atherosclerotic causes of chest pain. Consider preventive therapy and risk factor modification. Risk Assessment/Calculations:     No BP recorded.  {Refresh Note OR Click here to enter BP  :1}***       Physical Exam:   VS:  There were no vitals taken for this visit.   Wt Readings from Last 3 Encounters:  07/19/23 215 lb 9.6 oz (97.8 kg)  07/19/23 215 lb (97.5 kg)  06/05/23 213 lb (96.6 kg)  GEN: Well nourished, well developed in no acute distress NECK: No JVD; No carotid bruits CARDIAC: ***RRR, no murmurs, rubs, gallops RESPIRATORY:  Clear to auscultation without rales, wheezing or rhonchi  ABDOMEN: Soft, non-tender, non-distended EXTREMITIES:  No edema; No deformity   ASSESSMENT AND PLAN: .   Coronary artery disease Primary hypertension Mixed hyperlipidemia    {Are you ordering a CV Procedure (e.g. stress test, cath,  DCCV, TEE, etc)?   Press F2        :[id number]}  Dispo: ***  Signed, Jadee Golebiewski, NP

## 2023-08-07 ENCOUNTER — Ambulatory Visit: Admitting: Student in an Organized Health Care Education/Training Program

## 2023-08-13 ENCOUNTER — Ambulatory Visit
Admission: RE | Admit: 2023-08-13 | Discharge: 2023-08-13 | Disposition: A | Discharge status: Home / Self Care | Source: Ambulatory Visit | Attending: Student in an Organized Health Care Education/Training Program | Admitting: Student in an Organized Health Care Education/Training Program

## 2023-08-13 ENCOUNTER — Ambulatory Visit
Attending: Student in an Organized Health Care Education/Training Program | Admitting: Student in an Organized Health Care Education/Training Program

## 2023-08-13 ENCOUNTER — Encounter: Payer: Self-pay | Admitting: Student in an Organized Health Care Education/Training Program

## 2023-08-13 VITALS — BP 135/82 | HR 89 | Temp 96.1°F | Resp 17 | Ht 67.0 in | Wt 215.0 lb

## 2023-08-13 DIAGNOSIS — G5702 Lesion of sciatic nerve, left lower limb: Secondary | ICD-10-CM | POA: Insufficient documentation

## 2023-08-13 DIAGNOSIS — G894 Chronic pain syndrome: Secondary | ICD-10-CM

## 2023-08-13 DIAGNOSIS — M5117 Intervertebral disc disorders with radiculopathy, lumbosacral region: Secondary | ICD-10-CM | POA: Insufficient documentation

## 2023-08-13 DIAGNOSIS — M545 Low back pain, unspecified: Secondary | ICD-10-CM | POA: Diagnosis present

## 2023-08-13 DIAGNOSIS — M533 Sacrococcygeal disorders, not elsewhere classified: Secondary | ICD-10-CM | POA: Diagnosis not present

## 2023-08-13 DIAGNOSIS — Z9049 Acquired absence of other specified parts of digestive tract: Secondary | ICD-10-CM | POA: Insufficient documentation

## 2023-08-13 DIAGNOSIS — Z01818 Encounter for other preprocedural examination: Secondary | ICD-10-CM | POA: Diagnosis not present

## 2023-08-13 DIAGNOSIS — M1612 Unilateral primary osteoarthritis, left hip: Secondary | ICD-10-CM | POA: Insufficient documentation

## 2023-08-13 MED ORDER — METHYLPREDNISOLONE ACETATE 40 MG/ML IJ SUSP
INTRAMUSCULAR | Status: AC
  Filled 2023-08-13: qty 1

## 2023-08-13 MED ORDER — ROPIVACAINE HCL 2 MG/ML IJ SOLN
INTRAMUSCULAR | Status: AC
  Filled 2023-08-13: qty 20

## 2023-08-13 MED ORDER — DEXAMETHASONE SODIUM PHOSPHATE 10 MG/ML IJ SOLN
INTRAMUSCULAR | Status: AC
  Filled 2023-08-13: qty 1

## 2023-08-13 MED ORDER — LIDOCAINE HCL 2 % IJ SOLN
20.0000 mL | Freq: Once | INTRAMUSCULAR | Status: AC
  Administered 2023-08-13: 100 mg

## 2023-08-13 MED ORDER — ROPIVACAINE HCL 2 MG/ML IJ SOLN
9.0000 mL | Freq: Once | INTRAMUSCULAR | Status: AC
  Administered 2023-08-13: 9 mL via PERINEURAL

## 2023-08-13 MED ORDER — ROPIVACAINE HCL 2 MG/ML IJ SOLN
9.0000 mL | Freq: Once | INTRAMUSCULAR | Status: AC
  Administered 2023-08-13: 9 mL via INTRA_ARTICULAR

## 2023-08-13 MED ORDER — LIDOCAINE HCL (PF) 2 % IJ SOLN
INTRAMUSCULAR | Status: AC
  Filled 2023-08-13: qty 10

## 2023-08-13 MED ORDER — DEXAMETHASONE SODIUM PHOSPHATE 10 MG/ML IJ SOLN
10.0000 mg | Freq: Once | INTRAMUSCULAR | Status: AC
  Administered 2023-08-13: 10 mg

## 2023-08-13 MED ORDER — METHYLPREDNISOLONE ACETATE 40 MG/ML IJ SUSP
40.0000 mg | Freq: Once | INTRAMUSCULAR | Status: AC
  Administered 2023-08-13: 40 mg via INTRA_ARTICULAR

## 2023-08-13 MED ORDER — IOHEXOL 180 MG/ML  SOLN
INTRAMUSCULAR | Status: AC
  Filled 2023-08-13: qty 20

## 2023-08-13 MED ORDER — IOHEXOL 180 MG/ML  SOLN
10.0000 mL | Freq: Once | INTRAMUSCULAR | Status: AC
  Administered 2023-08-13: 10 mL via INTRA_ARTICULAR

## 2023-08-13 NOTE — Progress Notes (Signed)
 PROVIDER NOTE: Interpretation of information contained herein should be left to medically-trained personnel. Specific patient instructions are provided elsewhere under "Patient Instructions" section of medical record. This document was created in part using STT-dictation technology, any transcriptional errors that may result from this process are unintentional.  Patient: Gary [name] Type: Established DOB: [birth year] MRN: [id number] PCP: Jimmy Moulding, MD  Service: Procedure DOS: 08/13/2023 Setting: Ambulatory Location: Ambulatory outpatient facility Delivery: Face-to-face Provider: Cephus Collin, MD Specialty: Interventional Pain Management Specialty designation: 09 Location: Outpatient facility Ref. Prov.: Cephus Collin, MD       Interventional Therapy   Procedure: Sacroiliac Joint Steroid Injection #1 and Left Piriformis TPI    Laterality: Left     Level: PIIS (Posterior Inferior Iliac Spine)  Target: Interarticular sacroiliac joint. Location: Medial to the postero-medial edge of iliac spine. Region: Lumbosacral-sacrococcygeal. Approach: Inferior postero-medial percutaneous approach. Type of procedure: Percutaneous joint injection.  Imaging: Fluoroscopy-guided Non-spinal ([id number]) Anesthesia: Local anesthesia (1-2% Lidocaine ) DOS: 08/13/2023  Performed by: Cephus Collin, MD  Purpose: Diagnostic/Therapeutic Indications: Sacroiliac joint pain in the lower back and hip area severe enough to impact quality of life or function. Rationale (medical necessity): procedure needed and proper for the diagnosis and/or treatment of Gary [name] medical symptoms and needs. Left SI joint arthritis and left piriformis syndrome   NAS-11 Pain score:   Pre-procedure: 7 /10   Post-procedure: 7 /10      Position / Prep / Materials:  Position: Prone  Prep solution: ChloraPrep (2% chlorhexidine gluconate and 70% isopropyl alcohol) Prep Area: Entire posterior lumbosacral area   Materials:  Tray: Block Needle(s):  Type: Spinal  Gauge (G): 22  Length: 3.5-in Qty: One (1) per procedure side.  H&P (Pre-op Assessment):  Gary [name] is a 73 y.o. (year old), male patient, seen today for interventional treatment. He  has a past surgical history that includes Cholecystectomy; Knee surgery (Left); deviated septum repair; Cardiac catheterization; Colonoscopy; Nasal septum surgery; Colonoscopy with propofol  (N/A, 01/09/2018); and Esophagogastroduodenoscopy (egd) with propofol  (N/A, 01/09/2018). Gary [name] has a current medication list which includes the following prescription(s): acetaminophen , albuterol sulfate, aspirin ec, cholecalciferol, clobetasol ointment, clotrimazole-betamethasone , diclofenac sodium, duloxetine , esomeprazole, ezetimibe, fenofibrate, hydrochlorothiazide, multivitamin, oxycodone -acetaminophen , [START ON 08/17/2023] oxycodone -acetaminophen , [START ON 09/16/2023] oxycodone -acetaminophen , potassium chloride sa, pregabalin , sildenafil , tamsulosin , and fluticasone-salmeterol, and the following Facility-Administered Medications: leuprolide  (6 month). His primarily concern today is the Back Pain (Left, lower)  Initial Vital Signs:  Pulse/HCG Rate: 89ECG Heart Rate: 89 Temp: (!) 96.1 F (35.6 C) Resp: 18 BP: 131/89 SpO2: 97 %  BMI: Estimated body mass index is 33.67 kg/m as calculated from the following:   Height as of this encounter: 5\' 7"  (1.702 m).   Weight as of this encounter: 215 lb (97.5 kg).  Risk Assessment: Allergies: Reviewed. He is allergic to niacin-lovastatin er, atorvastatin, other, simvastatin, and propoxyphene.  Allergy Precautions: None required Coagulopathies: Reviewed. None identified.  Blood-thinner therapy: None at this time Active Infection(s): Reviewed. None identified. Gary [name] is afebrile  Site Confirmation: Gary [name] was asked to confirm the procedure and laterality before marking the site Procedure checklist:  Completed Consent: Before the procedure and under the influence of no sedative(s), amnesic(s), or anxiolytics, the patient was informed of the treatment options, risks and possible complications. To fulfill our ethical and legal obligations, as recommended by the American Medical Association's Code of Ethics, I have informed the patient of my clinical impression; the nature and purpose of the treatment or procedure; the risks, benefits, and possible complications of  the intervention; the alternatives, including doing nothing; the risk(s) and benefit(s) of the alternative treatment(s) or procedure(s); and the risk(s) and benefit(s) of doing nothing. The patient was provided information about the general risks and possible complications associated with the procedure. These may include, but are not limited to: failure to achieve desired goals, infection, bleeding, organ or nerve damage, allergic reactions, paralysis, and death. In addition, the patient was informed of those risks and complications associated to the procedure, such as failure to decrease pain; infection; bleeding; organ or nerve damage with subsequent damage to sensory, motor, and/or autonomic systems, resulting in permanent pain, numbness, and/or weakness of one or several areas of the body; allergic reactions; (i.e.: anaphylactic reaction); and/or death. Furthermore, the patient was informed of those risks and complications associated with the medications. These include, but are not limited to: allergic reactions (i.e.: anaphylactic or anaphylactoid reaction(s)); adrenal axis suppression; blood sugar elevation that in diabetics may result in ketoacidosis or comma; water retention that in patients with history of congestive heart failure may result in shortness of breath, pulmonary edema, and decompensation with resultant heart failure; weight gain; swelling or edema; medication-induced neural toxicity; particulate matter embolism and blood vessel  occlusion with resultant organ, and/or nervous system infarction; and/or aseptic necrosis of one or more joints. Finally, the patient was informed that Medicine is not an exact science; therefore, there is also the possibility of unforeseen or unpredictable risks and/or possible complications that may result in a catastrophic outcome. The patient indicated having understood very clearly. We have given the patient no guarantees and we have made no promises. Enough time was given to the patient to ask questions, all of which were answered to the patient's satisfaction. Gary [name] has indicated that he wanted to continue with the procedure. Attestation: I, the ordering provider, attest that I have discussed with the patient the benefits, risks, side-effects, alternatives, likelihood of achieving goals, and potential problems during recovery for the procedure that I have provided informed consent. Date  Time: 08/13/2023 10:59 AM  Pre-Procedure Preparation:  Monitoring: As per clinic protocol. Respiration, ETCO2, SpO2, BP, heart rate and rhythm monitor placed and checked for adequate function Safety Precautions: Patient was assessed for positional comfort and pressure points before starting the procedure. Time-out: I initiated and conducted the "Time-out" before starting the procedure, as per protocol. The patient was asked to participate by confirming the accuracy of the "Time Out" information. Verification of the correct person, site, and procedure were performed and confirmed by me, the nursing staff, and the patient. "Time-out" conducted as per Joint Commission's Universal Protocol (UP.01.01.01). Time: 1117 Start Time: 1117 hrs.  Description/Narrative of Procedure:          Start Time: 1117 hrs.  Rationale (medical necessity): procedure needed and proper for the diagnosis and/or treatment of the patient's medical symptoms and needs. Procedural Technique Safety Precautions: Aspiration looking for blood  return was conducted prior to all injections. At no point did we inject any substances, as a needle was being advanced. No attempts were made at seeking any paresthesias. Safe injection practices and needle disposal techniques used. Medications properly checked for expiration dates. SDV (single dose vial) medications used. Description of the Procedure: Protocol guidelines were followed. The patient was assisted into a comfortable position. The target area was identified and the area prepped in the usual manner. Skin & deeper tissues infiltrated with local anesthetic. Appropriate amount of time allowed to pass for local anesthetics to take effect. The procedure needles were  then advanced to the target area. Proper needle placement secured. Negative aspiration confirmed. Solution injected in intermittent fashion, asking for systemic symptoms every 0.5cc of injectate. The needles were then removed and the area cleansed, making sure to leave some of the prepping solution back to take advantage of its long term bactericidal properties.  Technical description of procedure:  Fluoroscopy using a posterior anterior 45 degree angle from the midline aiming at the anterolateral aspect of the patient was used to find a direct path into the sacroiliac joint, the superior medial to posterior superior iliac spine.  The skin was marked where the desired target and the skin infiltrated with local anesthetics.  The procedure needle was then advanced until the joint was entered.  Once inside of the joint, we then proceeded to inject the desired solution.  10 cc solution made of 9 cc of 0.2% ropivacaine , 1 cc of Decadron  10 mg/cc.  5 cc injected into the left SI joint.    Afterwards a left piriformis trigger point injection was done 1 cm inferior, 1 cm deep, 1 cm lateral to the inferior fissure of the SI joint.  Contrast was injected to confirm piriformis muscle striation.  While injecting, patient did not complain of any pain  radiating down his leg.  5 cc of injected nerve block solution injected into left Piriformis.     Vitals:   08/13/23 1100 08/13/23 1116 08/13/23 1121 08/13/23 1125  BP: 131/89 113/72 112/75 135/82  Pulse: 89     Resp: 18 17 18 17   Temp: (!) 96.1 F (35.6 C)     TempSrc: Temporal     SpO2: 97% 95% 95% 98%  Weight: 215 lb (97.5 kg)     Height: 5\' 7"  (1.702 m)        End Time: 1124 hrs.  Imaging Guidance (Non-Spinal):          Type of Imaging Technique: Fluoroscopy Guidance (Non-Spinal) Indication(s): Fluoroscopy guidance for needle placement to enhance accuracy in procedures requiring precise needle localization for targeted delivery of medication in or near specific anatomical locations not easily accessible without such real-time imaging assistance. Exposure Time: Please see nurses notes. Contrast: Before injecting any contrast, we confirmed that the patient did not have an allergy to iodine, shellfish, or radiological contrast. Once satisfactory needle placement was completed at the desired level, radiological contrast was injected. Contrast injected under live fluoroscopy. No contrast complications. See chart for type and volume of contrast used. Fluoroscopic Guidance: I was personally present during the use of fluoroscopy. "Tunnel Vision Technique" used to obtain the best possible view of the target area. Parallax error corrected before commencing the procedure. "Direction-depth-direction" technique used to introduce the needle under continuous pulsed fluoroscopy. Once target was reached, antero-posterior, oblique, and lateral fluoroscopic projection used confirm needle placement in all planes. Images permanently stored in EMR. Interpretation: I personally interpreted the imaging intraoperatively. Adequate needle placement confirmed in multiple planes. Appropriate spread of contrast into desired area was observed. No evidence of afferent or efferent intravascular uptake. Permanent images  saved into the patient's record.  Post-operative Assessment:  Post-procedure Vital Signs:  Pulse/HCG Rate: 8980 Temp: (!) 96.1 F (35.6 C) Resp: 17 BP: 135/82 SpO2: 98 %  EBL: None  Complications: No immediate post-treatment complications observed by team, or reported by patient.  Note: The patient tolerated the entire procedure well. A repeat set of vitals were taken after the procedure and the patient was kept under observation following institutional policy, for this type  of procedure. Post-procedural neurological assessment was performed, showing return to baseline, prior to discharge. The patient was provided with post-procedure discharge instructions, including a section on how to identify potential problems. Should any problems arise concerning this procedure, the patient was given instructions to immediately contact us , at any time, without hesitation. In any case, we plan to contact the patient by telephone for a follow-up status report regarding this interventional procedure.  Comments:  No additional relevant information.  Plan of Care (POC)  Orders:  Orders Placed This Encounter  Procedures   DG PAIN CLINIC C-ARM 1-60 MIN NO REPORT    Intraoperative interpretation by procedural physician at [facility].    Standing Status:   Standing    Number of Occurrences:   1    Reason for exam::   Assistance in needle guidance and placement for procedures requiring needle placement in or near specific anatomical locations not easily accessible without such assistance.   Chronic Opioid Analgesic:   Percocet 10 mg TID prn, #90/month, MME=45   Medications ordered for procedure: Meds ordered this encounter  Medications   iohexol  (OMNIPAQUE ) 180 MG/ML injection 10 mL    Must be Myelogram-compatible. If not available, you may substitute with a water-soluble, non-ionic, hypoallergenic, myelogram-compatible radiological contrast medium.   lidocaine  (XYLOCAINE ) 2 % (with pres)  injection 400 mg   dexamethasone  (DECADRON ) injection 10 mg   methylPREDNISolone acetate (DEPO-MEDROL) injection 40 mg   ropivacaine  (PF) 2 mg/mL (0.2%) (NAROPIN ) injection 9 mL   ropivacaine  (PF) 2 mg/mL (0.2%) (NAROPIN ) injection 9 mL   Medications administered: We administered iohexol , lidocaine , dexamethasone , methylPREDNISolone acetate, ropivacaine  (PF) 2 mg/mL (0.2%), and ropivacaine  (PF) 2 mg/mL (0.2%).  See the medical record for exact dosing, route, and time of administration.  Follow-up plan:   Return keep 06/26 appt.       s/p b/l SI-J 12/02/2018- not helpful,  lumbar facets L3-S1 b/l; consider sprint peripheral nerve stimulation of lumbar medial branch lumbar facet arthropathy.            Recent Visits Date Type Provider Dept  07/19/23 Office Visit Cephus Collin, MD [facility]  06/05/23 Office Visit Cephus Collin, MD [facility]  Showing recent visits within past 90 days and meeting all other requirements Today's Visits Date Type Provider Dept  08/13/23 Procedure visit Cephus Collin, MD [facility]  Showing today's visits and meeting all other requirements Future Appointments Date Type Provider Dept  10/04/23 Appointment Cephus Collin, MD [facility]  Showing future appointments within next 90 days and meeting all other requirements  Disposition: Discharge home  Discharge (Date  Time): 08/13/2023; 1135 hrs.   Primary Care Physician: Jimmy Moulding, MD Location: [facility] Note by: Cephus Collin, MD (TTS technology used. I apologize for any typographical errors that were not detected and corrected.) Date: 08/13/2023; Time: 11:29 AM  Disclaimer:  Medicine is not an Visual merchandiser. The only guarantee in medicine is that nothing is guaranteed. It is important to note that the decision to proceed with this intervention was based on the information collected from the patient. The Data and  conclusions were drawn from the patient's questionnaire, the interview, and the physical examination. Because the information was provided in large part by the patient, it cannot be guaranteed that it has not been purposely or unconsciously manipulated. Every effort has been made to obtain as much relevant data as possible for this evaluation. It is important to note that  the conclusions that lead to this procedure are derived in large part from the available data. Always take into account that the treatment will also be dependent on availability of resources and existing treatment guidelines, considered by other Pain Management Practitioners as being common knowledge and practice, at the time of the intervention. For Medico-Legal purposes, it is also important to point out that variation in procedural techniques and pharmacological choices are the acceptable norm. The indications, contraindications, technique, and results of the above procedure should only be interpreted and judged by a Board-Certified Interventional Pain Specialist with extensive familiarity and expertise in the same exact procedure and technique.

## 2023-08-13 NOTE — Patient Instructions (Signed)
Sacroiliac (SI) Joint Injection Patient Information  Description: The sacroiliac joint connects the scrum (very low back and tailbone) to the ilium (a pelvic bone which also forms half of the hip joint).  Normally this joint experiences very little motion.  When this joint becomes inflamed or unstable low back and or hip and pelvis pain may result.  Injection of this joint with local anesthetics (numbing medicines) and steroids can provide diagnostic information and reduce pain.  This injection is performed with the aid of x-ray guidance into the tailbone area while you are lying on your stomach.   You may experience an electrical sensation down the leg while this is being done.  You may also experience numbness.  We also may ask if we are reproducing your normal pain during the injection.  Conditions which may be treated SI injection:  Low back, buttock, hip or leg pain  Preparation for the Injection:  Do not eat any solid food or dairy products within 8 hours of your appointment.  You may drink clear liquids up to 3 hours before appointment.  Clear liquids include water, black coffee, juice or soda.  No milk or cream please. You may take your regular medications, including pain medications with a sip of water before your appointment.  Diabetics should hold regular insulin (if take separately) and take 1/2 normal NPH dose the morning of the procedure.  Carry some sugar containing items with you to your appointment. A driver must accompany you and be prepared to drive you home after your procedure. Bring all of your current medications with you. An IV may be inserted and sedation may be given at the discretion of the physician. A blood pressure cuff, EKG and other monitors will often be applied during the procedure.  Some patients may need to have extra oxygen administered for a short period.  You will be asked to provide medical information, including your allergies, prior to the procedure.  We  must know immediately if you are taking blood thinners (like Coumadin/Warfarin) or if you are allergic to IV iodine contrast (dye).  We must know if you could possible be pregnant.  Possible side effects:  Bleeding from needle site Infection (rare, may require surgery) Nerve injury (rare) Numbness & tingling (temporary) A brief convulsion or seizure Light-headedness (temporary) Pain at injection site (several days) Decreased blood pressure (temporary) Weakness in the leg (temporary)   Call if you experience:  New onset weakness or numbness of an extremity below the injection site that last more than 8 hours. Hives or difficulty breathing ( go to the emergency room) Inflammation or drainage at the injection site Any new symptoms which are concerning to you  Please note:  Although the local anesthetic injected can often make your back/ hip/ buttock/ leg feel good for several hours after the injections, the pain will likely return.  It takes 3-7 days for steroids to work in the sacroiliac area.  You may not notice any pain relief for at least that one week.  If effective, we will often do a series of three injections spaced 3-6 weeks apart to maximally decrease your pain.  After the initial series, we generally will wait some months before a repeat injection of the same type.  If you have any questions, please call [phone] [facility]  Pain Management Discharge Instructions  General Discharge Instructions :  If you need to reach your doctor call: Monday-Friday 8:00 am - 4:00 pm at  [phone] or toll free [phone].  After clinic hours [phone] to have operator reach doctor.  Bring all of your medication bottles to all your appointments in the pain clinic.  To cancel or reschedule your appointment with Pain Management please remember to call 24 hours in advance to avoid a fee.  Refer to the educational materials which you have  been given on: General Risks, I had my Procedure. Discharge Instructions, Post Sedation.  Post Procedure Instructions:  The drugs you were given will stay in your system until tomorrow, so for the next 24 hours you should not drive, make any legal decisions or drink any alcoholic beverages.  You may eat anything you prefer, but it is better to start with liquids then soups and crackers, and gradually work up to solid foods.  Please notify your doctor immediately if you have any unusual bleeding, trouble breathing or pain that is not related to your normal pain.  Depending on the type of procedure that was done, some parts of your body may feel week and/or numb.  This usually clears up by tonight or the next day.  Walk with the use of an assistive device or accompanied by an adult for the 24 hours.  You may use ice on the affected area for the first 24 hours.  Put ice in a Ziploc bag and cover with a towel and place against area 15 minutes on 15 minutes off.  You may switch to heat after 24 hours.

## 2023-08-13 NOTE — Progress Notes (Signed)
 PROVIDER NOTE: Interpretation of information contained herein should be left to medically-trained personnel. Specific patient instructions are provided elsewhere under "Patient Instructions" section of medical record. This document was created in part using STT-dictation technology, any transcriptional errors that may result from this process are unintentional.  Patient: Gary [name] Type: Established DOB: [birth year] MRN: [id number] PCP: Jimmy Moulding, MD  Service: Procedure DOS: 08/13/2023 Setting: Ambulatory Location: Ambulatory outpatient facility Delivery: Face-to-face Provider: Cephus Collin, MD Specialty: Interventional Pain Management Specialty designation: 09 Location: Outpatient facility Ref. Prov.: Cephus Collin, MD       Interventional Therapy   Procedure: Intra-articular hip injection  #1  Laterality: Left (-LT)  Approach: Percutaneous posterolateral approach. Level: Lower pelvic and hip joint level.  Imaging: Fluoroscopy-guided         Anesthesia: Local anesthesia (1-2% Lidocaine ) DOS: 08/13/2023  Performed by: Cephus Collin, MD  Purpose: Diagnostic/Therapeutic Indications: Hip pain severe enough to impact quality of life or function. Rationale (medical necessity): procedure needed and proper for the diagnosis and/or treatment of Gary [name] medical symptoms and needs. LEFT Hip OA  NAS-11 Pain score:   Pre-procedure: 7 /10   Post-procedure: 7 /10      Target: Intra-articular hip joint Region: Hip joint, upper (proximal) femoral region Type of procedure: Percutaneous joint injection   Position / Prep / Materials:  Position: Supine  Prep solution: ChloraPrep (2% chlorhexidine gluconate and 70% isopropyl alcohol) Prep Area:  Entire Posterolateral hip area. Materials:  Tray: Block tray Needle(s):  Type: Spinal  Gauge (G): 22  Length: 3.5 Qty: 1  H&P (Pre-op Assessment):  Gary [name] is a 73 y.o. (year old), male patient, seen today for  interventional treatment. He  has a past surgical history that includes Cholecystectomy; Knee surgery (Left); deviated septum repair; Cardiac catheterization; Colonoscopy; Nasal septum surgery; Colonoscopy with propofol  (N/A, 01/09/2018); and Esophagogastroduodenoscopy (egd) with propofol  (N/A, 01/09/2018). Gary [name] has a current medication list which includes the following prescription(s): acetaminophen , albuterol sulfate, aspirin ec, cholecalciferol, clobetasol ointment, clotrimazole-betamethasone , diclofenac sodium, duloxetine , esomeprazole, ezetimibe, fenofibrate, hydrochlorothiazide, multivitamin, oxycodone -acetaminophen , [START ON 08/17/2023] oxycodone -acetaminophen , [START ON 09/16/2023] oxycodone -acetaminophen , potassium chloride sa, pregabalin , sildenafil , tamsulosin , and fluticasone-salmeterol, and the following Facility-Administered Medications: leuprolide  (6 month). His primarily concern today is the Back Pain (Left, lower)  Initial Vital Signs:  Pulse/HCG Rate: 89ECG Heart Rate: 89 Temp: (!) 96.1 F (35.6 C) Resp: 18 BP: 131/89 SpO2: 97 %  BMI: Estimated body mass index is 33.67 kg/m as calculated from the following:   Height as of this encounter: 5\' 7"  (1.702 m).   Weight as of this encounter: 215 lb (97.5 kg).  Risk Assessment: Allergies: Reviewed. He is allergic to niacin-lovastatin er, atorvastatin, other, simvastatin, and propoxyphene.  Allergy Precautions: None required Coagulopathies: Reviewed. None identified.  Blood-thinner therapy: None at this time Active Infection(s): Reviewed. None identified. Gary [name] is afebrile  Site Confirmation: Gary [name] was asked to confirm the procedure and laterality before marking the site Procedure checklist: Completed Consent: Before the procedure and under the influence of no sedative(s), amnesic(s), or anxiolytics, the patient was informed of the treatment options, risks and possible complications. To fulfill our ethical and legal  obligations, as recommended by the American Medical Association's Code of Ethics, I have informed the patient of my clinical impression; the nature and purpose of the treatment or procedure; the risks, benefits, and possible complications of the intervention; the alternatives, including doing nothing; the risk(s) and benefit(s) of the alternative treatment(s) or procedure(s); and the risk(s) and benefit(s)  of doing nothing. The patient was provided information about the general risks and possible complications associated with the procedure. These may include, but are not limited to: failure to achieve desired goals, infection, bleeding, organ or nerve damage, allergic reactions, paralysis, and death. In addition, the patient was informed of those risks and complications associated to the procedure, such as failure to decrease pain; infection; bleeding; organ or nerve damage with subsequent damage to sensory, motor, and/or autonomic systems, resulting in permanent pain, numbness, and/or weakness of one or several areas of the body; allergic reactions; (i.e.: anaphylactic reaction); and/or death. Furthermore, the patient was informed of those risks and complications associated with the medications. These include, but are not limited to: allergic reactions (i.e.: anaphylactic or anaphylactoid reaction(s)); adrenal axis suppression; blood sugar elevation that in diabetics may result in ketoacidosis or comma; water retention that in patients with history of congestive heart failure may result in shortness of breath, pulmonary edema, and decompensation with resultant heart failure; weight gain; swelling or edema; medication-induced neural toxicity; particulate matter embolism and blood vessel occlusion with resultant organ, and/or nervous system infarction; and/or aseptic necrosis of one or more joints. Finally, the patient was informed that Medicine is not an exact science; therefore, there is also the possibility of  unforeseen or unpredictable risks and/or possible complications that may result in a catastrophic outcome. The patient indicated having understood very clearly. We have given the patient no guarantees and we have made no promises. Enough time was given to the patient to ask questions, all of which were answered to the patient's satisfaction. Mr. Hambelton has indicated that he wanted to continue with the procedure. Attestation: I, the ordering provider, attest that I have discussed with the patient the benefits, risks, side-effects, alternatives, likelihood of achieving goals, and potential problems during recovery for the procedure that I have provided informed consent. Date  Time: 08/13/2023 10:59 AM  Pre-Procedure Preparation:  Monitoring: As per clinic protocol. Respiration, ETCO2, SpO2, BP, heart rate and rhythm monitor placed and checked for adequate function Safety Precautions: Patient was assessed for positional comfort and pressure points before starting the procedure. Time-out: I initiated and conducted the "Time-out" before starting the procedure, as per protocol. The patient was asked to participate by confirming the accuracy of the "Time Out" information. Verification of the correct person, site, and procedure were performed and confirmed by me, the nursing staff, and the patient. "Time-out" conducted as per Joint Commission's Universal Protocol (UP.01.01.01). Time: 1117 Start Time: 1117 hrs.  Description/Narrative of Procedure:          Rationale (medical necessity): procedure needed and proper for the diagnosis and/or treatment of the patient's medical symptoms and needs. Procedural Technique Safety Precautions: Aspiration looking for blood return was conducted prior to all injections. At no point did we inject any substances, as a needle was being advanced. No attempts were made at seeking any paresthesias. Safe injection practices and needle disposal techniques used. Medications properly  checked for expiration dates. SDV (single dose vial) medications used. Description of the Procedure: Protocol guidelines were followed. The patient was assisted into a comfortable position. The target area was identified and the area prepped in the usual manner. Skin & deeper tissues infiltrated with local anesthetic. Appropriate amount of time allowed to pass for local anesthetics to take effect. The procedure needles were then advanced to the target area. Proper needle placement secured. Negative aspiration confirmed. Solution injected in intermittent fashion, asking for systemic symptoms every 0.5cc of injectate. The  needles were then removed and the area cleansed, making sure to leave some of the prepping solution back to take advantage of its long term bactericidal properties.  Technical description of procedure:  Skin & deeper tissues infiltrated with local anesthetic. Appropriate amount of time allowed to pass for local anesthetics to take effect. The procedure needles were then advanced to the target area. Proper needle placement secured. Negative aspiration confirmed. Solution injected in intermittent fashion, asking for systemic symptoms every 0.5cc of injectate. The needles were then removed and the area cleansed, making sure to leave some of the prepping solution back to take advantage of its long term bactericidal properties.  5 cc solution made of 4 cc of 0.2% ropivacaine , 1 cc of methylprednisolone, 40 mg/cc.  Injected into the left hip joint       Vitals:   08/13/23 1100 08/13/23 1116 08/13/23 1121 08/13/23 1125  BP: 131/89 113/72 112/75 135/82  Pulse: 89     Resp: 18 17 18 17   Temp: (!) 96.1 F (35.6 C)     TempSrc: Temporal     SpO2: 97% 95% 95% 98%  Weight: 215 lb (97.5 kg)     Height: 5\' 7"  (1.702 m)        Start Time: 1117 hrs. End Time: 1124 hrs.  Imaging Guidance (Non-Spinal):          Type of Imaging Technique: Fluoroscopy Guidance (Non-Spinal) Indication(s):  Fluoroscopy guidance for needle placement to enhance accuracy in procedures requiring precise needle localization for targeted delivery of medication in or near specific anatomical locations not easily accessible without such real-time imaging assistance. Exposure Time: Please see nurses notes. Contrast: Before injecting any contrast, we confirmed that the patient did not have an allergy to iodine, shellfish, or radiological contrast. Once satisfactory needle placement was completed at the desired level, radiological contrast was injected. Contrast injected under live fluoroscopy. No contrast complications. See chart for type and volume of contrast used. Fluoroscopic Guidance: I was personally present during the use of fluoroscopy. "Tunnel Vision Technique" used to obtain the best possible view of the target area. Parallax error corrected before commencing the procedure. "Direction-depth-direction" technique used to introduce the needle under continuous pulsed fluoroscopy. Once target was reached, antero-posterior, oblique, and lateral fluoroscopic projection used confirm needle placement in all planes. Images permanently stored in EMR. Interpretation: I personally interpreted the imaging intraoperatively. Adequate needle placement confirmed in multiple planes. Appropriate spread of contrast into desired area was observed. No evidence of afferent or efferent intravascular uptake. Permanent images saved into the patient's record.  Post-operative Assessment:  Post-procedure Vital Signs:  Pulse/HCG Rate: 8980 Temp: (!) 96.1 F (35.6 C) Resp: 17 BP: 135/82 SpO2: 98 %  EBL: None  Complications: No immediate post-treatment complications observed by team, or reported by patient.  Note: The patient tolerated the entire procedure well. A repeat set of vitals were taken after the procedure and the patient was kept under observation following institutional policy, for this type of procedure. Post-procedural  neurological assessment was performed, showing return to baseline, prior to discharge. The patient was provided with post-procedure discharge instructions, including a section on how to identify potential problems. Should any problems arise concerning this procedure, the patient was given instructions to immediately contact us , at any time, without hesitation. In any case, we plan to contact the patient by telephone for a follow-up status report regarding this interventional procedure.  Comments:  No additional relevant information.  Plan of Care (POC)  Orders:  Orders  Placed This Encounter  Procedures   DG PAIN CLINIC C-ARM 1-60 MIN NO REPORT    Intraoperative interpretation by procedural physician at [facility].    Standing Status:   Standing    Number of Occurrences:   1    Reason for exam::   Assistance in needle guidance and placement for procedures requiring needle placement in or near specific anatomical locations not easily accessible without such assistance.   Chronic Opioid Analgesic:   Percocet 10 mg TID prn, #90/month, MME=45   Medications ordered for procedure: Meds ordered this encounter  Medications   iohexol  (OMNIPAQUE ) 180 MG/ML injection 10 mL    Must be Myelogram-compatible. If not available, you may substitute with a water-soluble, non-ionic, hypoallergenic, myelogram-compatible radiological contrast medium.   lidocaine  (XYLOCAINE ) 2 % (with pres) injection 400 mg   dexamethasone  (DECADRON ) injection 10 mg   methylPREDNISolone acetate (DEPO-MEDROL) injection 40 mg   ropivacaine  (PF) 2 mg/mL (0.2%) (NAROPIN ) injection 9 mL   ropivacaine  (PF) 2 mg/mL (0.2%) (NAROPIN ) injection 9 mL   Medications administered: We administered iohexol , lidocaine , dexamethasone , methylPREDNISolone acetate, ropivacaine  (PF) 2 mg/mL (0.2%), and ropivacaine  (PF) 2 mg/mL (0.2%).  See the medical record for exact dosing, route, and time of administration.  Follow-up plan:    Return keep 06/26 appt.      Recent Visits Date Type Provider Dept  07/19/23 Office Visit Cephus Collin, MD [facility]  06/05/23 Office Visit Cephus Collin, MD [facility]  Showing recent visits within past 90 days and meeting all other requirements Today's Visits Date Type Provider Dept  08/13/23 Procedure visit Cephus Collin, MD [facility]  Showing today's visits and meeting all other requirements Future Appointments Date Type Provider Dept  10/04/23 Appointment Cephus Collin, MD [facility]  Showing future appointments within next 90 days and meeting all other requirements  Disposition: Discharge home  Discharge (Date  Time): 08/13/2023; 1135 hrs.   Primary Care Physician: Jimmy Moulding, MD Location: [facility] Note by: Cephus Collin, MD (TTS technology used. I apologize for any typographical errors that were not detected and corrected.) Date: 08/13/2023; Time: 11:34 AM  Disclaimer:  Medicine is not an Visual merchandiser. The only guarantee in medicine is that nothing is guaranteed. It is important to note that the decision to proceed with this intervention was based on the information collected from the patient. The Data and conclusions were drawn from the patient's questionnaire, the interview, and the physical examination. Because the information was provided in large part by the patient, it cannot be guaranteed that it has not been purposely or unconsciously manipulated. Every effort has been made to obtain as much relevant data as possible for this evaluation. It is important to note that the conclusions that lead to this procedure are derived in large part from the available data. Always take into account that the treatment will also be dependent on availability of resources and existing treatment guidelines, considered by other Pain Management Practitioners as being common knowledge and practice, at the time  of the intervention. For Medico-Legal purposes, it is also important to point out that variation in procedural techniques and pharmacological choices are the acceptable norm. The indications, contraindications, technique, and results of the above procedure should only be interpreted and judged by a Board-Certified Interventional Pain Specialist with extensive familiarity and expertise in the same exact procedure and technique.

## 2023-08-14 ENCOUNTER — Telehealth: Payer: Self-pay

## 2023-08-14 NOTE — Telephone Encounter (Signed)
 Post procedure follow up.  LM

## 2023-08-22 ENCOUNTER — Ambulatory Visit: Attending: Cardiology | Admitting: Cardiology

## 2023-08-28 ENCOUNTER — Telehealth: Payer: Self-pay

## 2023-08-28 NOTE — Telephone Encounter (Signed)
 Copied from CRM [id number]. Topic: General - Other >> Aug 28, 2023  4:38 PM Gary [name] wrote: Reason for CRM: patient is calling because his oxygen mask doent fit or work for him, patient oxygen water d8ispenser is not properly dispensing water and patient is scared that he may be ingesting the water in his lungs due to him having congestion in his chest. Please call patient

## 2023-08-31 ENCOUNTER — Ambulatory Visit: Payer: Self-pay

## 2023-08-31 NOTE — Telephone Encounter (Signed)
 Patient denies any current symptoms at this time. Patient calling back with concerns about CPAP machine. He had initially contacted pulmonary office about his CPAP concerns on 08/28/2023. Patient was instructed to call company he received his CPAP machine from to give troubleshooting assistance. Patient verbalized understanding and all questions answered.   Copied from CRM [id number]. Topic: Clinical - Red Word Triage >> Aug 31, 2023  3:41 PM Ilene Malling wrote: Red Word that prompted transfer to Nurse Triage: Patient [phone] wants to speak with nurse regarding cpap machine and humidifier settings it out of whack and it doesn't feel right. A nurse was suppose to be read out. Patient felt like drowning with the new mask, so patient went back to the old mask. Patient called earlier and has not heard back from the office. Please advise. Reason for Disposition  General information question, no triage required and triager able to answer question  Answer Assessment - Initial Assessment Questions 1. REASON FOR CALL or QUESTION: "What is your reason for calling today?" or "How can I best help you?" or "What question do you have that I can help answer?"     Patient called with concerns for CPAP machine and humidifier setting. Patient spoke to [facility] from office on 5/202/2025. Patient states his new mask made him feel like he was drowning. Patient went back to his old mask. Patient was ultimately calling due to having not heard back from anyone at the office.  Protocols used: Information Only Call - No Triage-A-AH

## 2023-09-04 NOTE — Telephone Encounter (Signed)
 Noted. Nothing further needed.

## 2023-09-05 ENCOUNTER — Other Ambulatory Visit: Payer: Self-pay | Admitting: *Deleted

## 2023-09-05 MED ORDER — TAMSULOSIN HCL 0.4 MG PO CAPS: 0.4000 mg | ORAL_CAPSULE | Freq: Every day | ORAL | 3 refills | Status: AC

## 2023-09-05 NOTE — Progress Notes (Unsigned)
 Cardiology Clinic Note   Date: 09/06/2023 ID: Gary [name], Gary [name] [birth year], MRN [id number]  Primary Cardiologist:  Constancia Delton, MD  Chief Complaint   Gary [name] is a 73 y.o. male who presents to the clinic today for routine follow up.   Patient Profile   Gary [name] is followed by Dr. Junnie Olives for the history outlined below.      Past medical history significant for: Nonobstructive CAD.  Coronary CTA 07/24/2022: Coronary calcium 32 (27th percentile). Minimal RCA and LCx stenosis.  Mitral valve regurgitation. Echo 07/24/2022: EF 55-60%. No RWMA. Normal diastolic parameters. Normal RV size/function. Mild MR. Aortic valve sclerosis without stenosis.  Hypertension.  Hyperlipidemia. Lipid panel 07/06/2023: HDL 32, TG 764. OSA. CPAP 100% adherence.  T2DM.  Prostate cancer. Former tobacco abuse.  In summary, patient was first evaluated by Dr. Junnie Olives on 06/09/2022 for nonexertional chest pain.  He had previously been followed by [facility] cardiology.  A Lexiscan in 2019 showed no ischemia and echo demonstrated normal LV function.  He underwent CT chest for lung cancer screening June 2023 which demonstrated LCx calcifications.  Patient reported statin intolerance and was tolerating Zetia and bempedoic acid.  He underwent coronary CTA which showed minimal RCA and LCx stenosis and echo which showed normal LV/RV function as detailed above.  Patient was last seen in the office by Dr. Junnie Olives on 07/28/2022 for follow-up after testing.  He reported being the caretaker for his wife and helping her ambulate.  He admitted to lifting her frequently which may have caused some muscle strain.  He was otherwise doing well and no changes were made.     History of Present Illness    Today, patient reports he is doing well. Patient denies shortness of breath, dyspnea on exertion, lower extremity edema, orthopnea or PND. He generally has no chest pain, pressure, or tightness. He did  have one episode of brief epigastric pain that went away within minutes. He feels it could be related to reflux.  He reports occasional palpitations described as heart racing. He does not do regular exercise. He is limited secondary to back pain. He is active doing yard work and caring for his wife who needs a lot of help with ADLs and transfers.     ROS: All other systems reviewed and are otherwise negative except as noted in History of Present Illness.  EKGs/Labs Reviewed    EKG Interpretation Date/Time:  Thursday Sep 06 2023 14:33:08 EDT Ventricular Rate:  99 PR Interval:  156 QRS Duration:  120 QT Interval:  378 QTC Calculation: 485 R Axis:   78  Text Interpretation: Sinus rhythm with frequent Premature ventricular complexes Right bundle branch block Compared to previous EKG 07/28/2022 no significant change Confirmed by Morey Ar [phone]) on 09/06/2023 2:42:33 PM    Physical Exam    VS:  BP 110/60 (BP Location: Left Arm, Patient Position: Sitting, Cuff Size: Normal)   Pulse 99   Ht 5\' 7"  (1.702 m)   Wt 208 lb 8 oz (94.6 kg)   SpO2 95%   BMI 32.66 kg/m  , BMI Body mass index is 32.66 kg/m.  GEN: Well nourished, well developed, in no acute distress. Neck: No JVD or carotid bruits. Cardiac:  RRR. Occasional extrasystole.  No murmurs. No rubs or gallops.   Respiratory:  Respirations regular and unlabored. Clear to auscultation without rales, wheezing or rhonchi. GI: Soft, nontender, nondistended. Extremities: Radials/DP/PT 2+ and equal bilaterally. No clubbing or cyanosis. No edema.  Skin: Warm and dry, no rash. Neuro: Strength intact.  Assessment & Plan   Nonobstructive CAD Coronary CTA April 2024 showed calcium score of 32, minimal RCA and LCx stenosis.  Patient generally does not have any chest pain. He had one episode of brief, nonexertional epigastric pain that went away on its own in minutes. He feels this could be related to reflux. He stays active doing yard  work and caring for his wife who needs a lot of assistance with ADLs. He would like to start a walking program. EKG without acute changes.  No further testing indicated at this time.  - Continue aspirin, Nexlizet, fenofibrate, Zetia.  Mitral valve regurgitation Echo April 2024 showed normal LV/RV function, normal diastolic parameters, mild MR, aortic valve sclerosis without stenosis.  Patient reports he had rheumatic fever as a child. He denies lightheadedness, dizziness, presyncope or syncope.  - Repeat echo as clinically indicated.  PVCs Patient reports occasional palpitations described as heart racing. EKG today shows sinus rhythm with PVCs HR 99 bpm. Occasional extrasystole auscultated on exam today.  - 7 day ZIO for further evaluation.   Hypertension BP today 110/60. No headaches or dizziness reported.  - Continue hydrochlorothiazide.  Hyperlipidemia/hypertriglyceridemia Lipid panel March 2025 showed TG 764.   - Start Nexlizet, fenofibrate, Zetia.  - Direct LDL today.   Disposition: Direct LDL today. 7 day ZIO. Return in 6-8 weeks or sooner as needed.          Signed, Lonell Rives. Hyun Marsalis, DNP, NP-C

## 2023-09-06 ENCOUNTER — Ambulatory Visit

## 2023-09-06 ENCOUNTER — Encounter: Payer: Self-pay | Admitting: Student

## 2023-09-06 ENCOUNTER — Ambulatory Visit: Attending: Student | Admitting: Student

## 2023-09-06 VITALS — BP 110/60 | HR 99 | Ht 67.0 in | Wt 208.5 lb

## 2023-09-06 DIAGNOSIS — I10 Essential (primary) hypertension: Secondary | ICD-10-CM

## 2023-09-06 DIAGNOSIS — Z79899 Other long term (current) drug therapy: Secondary | ICD-10-CM | POA: Diagnosis not present

## 2023-09-06 DIAGNOSIS — I051 Rheumatic mitral insufficiency: Secondary | ICD-10-CM

## 2023-09-06 DIAGNOSIS — I2510 Atherosclerotic heart disease of native coronary artery without angina pectoris: Secondary | ICD-10-CM | POA: Diagnosis not present

## 2023-09-06 DIAGNOSIS — R002 Palpitations: Secondary | ICD-10-CM | POA: Diagnosis not present

## 2023-09-06 DIAGNOSIS — E785 Hyperlipidemia, unspecified: Secondary | ICD-10-CM

## 2023-09-06 DIAGNOSIS — E781 Pure hyperglyceridemia: Secondary | ICD-10-CM

## 2023-09-06 DIAGNOSIS — I493 Ventricular premature depolarization: Secondary | ICD-10-CM

## 2023-09-06 NOTE — Patient Instructions (Signed)
 Medication Instructions:  Your Physician recommend you continue on your current medication as directed.    *If you need a refill on your cardiac medications before your next appointment, please call your pharmacy*  Lab Work: Your provider would like for you to have following labs drawn today LDL Direct.   If you have labs (blood work) drawn today and your tests are completely normal, you will receive your results only by: MyChart Message (if you have MyChart) OR A paper copy in the mail If you have any lab test that is abnormal or we need to change your treatment, we will call you to review the results.  Testing/Procedures: Your physician has recommended that you wear a Zio monitor.   This monitor is a medical device that records the heart's electrical activity. Doctors most often use these monitors to diagnose arrhythmias. Arrhythmias are problems with the speed or rhythm of the heartbeat. The monitor is a small device applied to your chest. You can wear one while you do your normal daily activities. While wearing this monitor if you have any symptoms to push the button and record what you felt. Once you have worn this monitor for the period of time provider prescribed (Usually 14 days), you will return the monitor device in the postage paid box. Once it is returned they will download the data collected and provide us  with a report which the provider will then review and we will call you with those results. Important tips:  Avoid showering during the first 24 hours of wearing the monitor. Avoid excessive sweating to help maximize wear time. Do not submerge the device, no hot tubs, and no swimming pools. Keep any lotions or oils away from the patch. After 24 hours you may shower with the patch on. Take brief showers with your back facing the shower head.  Do not remove patch once it has been placed because that will interrupt data and decrease adhesive wear time. Push the button when you have  any symptoms and write down what you were feeling. Once you have completed wearing your monitor, remove and place into box which has postage paid and place in your outgoing mailbox.  If for some reason you have misplaced your box then call our office and we can provide another box and/or mail it off for you.   Follow-Up: At [facility], you and your health needs are our priority.  As part of our continuing mission to provide you with exceptional heart care, our providers are all part of one team.  This team includes your primary Cardiologist (physician) and Advanced Practice Providers or APPs (Physician Assistants and Nurse Practitioners) who all work together to provide you with the care you need, when you need it.  Your next appointment:   6 - 8 week(s)  Provider:   You may see Constancia Delton, MD or one of the following Advanced Practice Providers on your designated Care Team:   Laneta Pintos, NP Gildardo Labrador, PA-C Varney Gentleman, PA-C Cadence Cortland West, PA-C Ronald Cockayne, NP Morey Ar, NP    We recommend signing up for the patient portal called "MyChart".  Sign up information is provided on this After Visit Summary.  MyChart is used to connect with patients for Virtual Visits (Telemedicine).  Patients are able to view lab/test results, encounter notes, upcoming appointments, etc.  Non-urgent messages can be sent to your provider as well.   To learn more about what you can do with MyChart, go to [email or web address].

## 2023-09-07 LAB — LDL CHOLESTEROL, DIRECT: LDL Direct: 37 mg/dL (ref 0–99)

## 2023-09-08 ENCOUNTER — Ambulatory Visit: Payer: Self-pay | Admitting: Student

## 2023-09-14 ENCOUNTER — Telehealth: Payer: Self-pay | Admitting: Cardiology

## 2023-09-14 NOTE — Telephone Encounter (Signed)
 Pt was told to wear the zio monitor for 7 days but he states the number he called the number on the box and was told to wear the monitor for 3 days and send it back.

## 2023-09-14 NOTE — Telephone Encounter (Signed)
 Called patient, advised that it looks like the order was placed for 3 days- ZIO advised him to send it back after 3 days, per the order. OV note states 7 days- DW, are you going to want more than 3 days. He has already worn it for the three days and will send it back, but was unsure if you would want more information- we would have to reorder and send another ZIO to him.   Thanks!

## 2023-09-17 NOTE — Telephone Encounter (Signed)
Called patient, LVM advising of message below.   Left call back number if questions/concerns.

## 2023-09-18 ENCOUNTER — Ambulatory Visit: Admitting: Sleep Medicine

## 2023-09-21 ENCOUNTER — Telehealth: Payer: Self-pay | Admitting: Student

## 2023-09-21 ENCOUNTER — Encounter: Payer: Self-pay | Admitting: Sleep Medicine

## 2023-09-21 ENCOUNTER — Ambulatory Visit: Admitting: Sleep Medicine

## 2023-09-21 VITALS — BP 112/80 | HR 60 | Temp 97.9°F | Ht 67.0 in | Wt 211.0 lb

## 2023-09-21 DIAGNOSIS — G4733 Obstructive sleep apnea (adult) (pediatric): Secondary | ICD-10-CM

## 2023-09-21 DIAGNOSIS — I10 Essential (primary) hypertension: Secondary | ICD-10-CM

## 2023-09-21 DIAGNOSIS — Z87891 Personal history of nicotine dependence: Secondary | ICD-10-CM

## 2023-09-21 NOTE — Progress Notes (Signed)
 Name:Gary [name] MRN: [id number] DOB: 21-Feb-1951   CHIEF COMPLAINT:  CPAP F/U   HISTORY OF PRESENT ILLNESS:  Gary [name] is a 73 y.o. w/ a h/o OSA, HTN, GERD and hyperlipidemia who presents for CPAP F/U visit. Reports using CPAP therapy every night, which is confirmed by compliance data. He is currently using the Airfit N30i nasal mask, which is comfortable however has intermittent air leaks. Overall states that he feels more refreshed upon awakening with CPAP therapy.    EPWORTH SLEEP SCORE    07/19/2023   11:00 AM  Results of the Epworth flowsheet  Sitting and reading 3  Watching TV 3  Sitting, inactive in a public place (e.g. a theatre or a meeting) 0  As a passenger in a car for an hour without a break 0  Lying down to rest in the afternoon when circumstances permit 3  Sitting and talking to someone 0  Sitting quietly after a lunch without alcohol 1  In a car, while stopped for a few minutes in traffic 0  Total score 10     PAST MEDICAL HISTORY :   has a past medical history of Anxiety, Asthma, Benign essential HTN (06/24/2015), BPH (benign prostatic hyperplasia), COPD (chronic obstructive pulmonary disease) (HCC), Coronary artery disease (02/16/2014), DDD (degenerative disc disease), cervical, Degenerative disc disease, cervical (01/22/2017), Depression, DJD (degenerative joint disease), Emphysema of lung (HCC), Heel pain (10/10/2017), Hyperglobulinemia, Lumbar degenerative disc disease (01/22/2017), Major depression in remission (HCC) (03/10/2014), Migraines, Mitral regurgitation, Mixed hyperlipidemia (02/16/2014), Neck pain (01/24/2017), OSA (obstructive sleep apnea) (07/05/2016), Osteoarthritis of knee (08/12/2014), Prostate cancer (HCC) (2019), RA (rheumatoid arthritis) (HCC), Spinal stenosis of lumbar region with neurogenic claudication (04/18/2016), Spondylosis without myelopathy or radiculopathy, lumbar region (01/22/2017), and Venous insufficiency of both lower  extremities (11/07/2016).  has a past surgical history that includes Cholecystectomy; Knee surgery (Left); deviated septum repair; Cardiac catheterization; Colonoscopy; Nasal septum surgery; Colonoscopy with propofol  (N/A, 01/09/2018); and Esophagogastroduodenoscopy (egd) with propofol  (N/A, 01/09/2018). Prior to Admission medications   Medication Sig Start Date End Date Taking? Authorizing Provider  acetaminophen  (TYLENOL ) 500 MG tablet Take as needed by mouth.    Yes [provider]  Albuterol Sulfate 108 (90 Base) MCG/ACT AEPB Inhale as needed into the lungs.    Yes [provider]  aspirin EC 81 MG tablet Take 1 tablet by mouth daily. 12/27/22  Yes [provider]  cholecalciferol (VITAMIN D) 1000 units tablet Take by mouth.   Yes [provider]  clobetasol ointment (TEMOVATE) 0.05 % Apply 1 application topically 2 (two) times daily.   Yes [provider]  clotrimazole-betamethasone  (LOTRISONE) cream Apply 1 application topically 2 (two) times daily.   Yes [provider]  diclofenac sodium (VOLTAREN) 1 % GEL Apply topically 4 (four) times daily.   Yes [provider]  DULoxetine  (CYMBALTA ) 60 MG capsule Take 1 capsule (60 mg total) by mouth daily. 06/05/23 05/30/24 Yes Cephus Collin, MD  esomeprazole (NEXIUM) 40 MG capsule Take 40 mg by mouth daily. 08/30/18  Yes [provider]  ezetimibe (ZETIA) 10 MG tablet Take 10 mg by mouth daily.  08/21/17  Yes [provider]  fenofibrate (TRICOR) 48 MG tablet Take 48 mg by mouth daily.   Yes [provider]  finasteride (PROSCAR) 5 MG tablet Take 1 tablet by mouth daily. 08/21/23  Yes [provider]  fluticasone-salmeterol (ADVAIR) 100-50 MCG/ACT AEPB Inhale into the lungs. 05/31/21 09/21/23 Yes [provider]  hydrochlorothiazide (HYDRODIURIL) 12.5 MG tablet Take 12.5 mg by mouth daily.   Yes [provider]  Multiple Vitamin (MULTIVITAMIN)  tablet Take 1 tablet by mouth daily.   Yes [provider]  NEXLIZET 180-10 MG TABS Take 1 tablet by mouth daily. 08/18/23  Yes [provider]  oxyCODONE -acetaminophen  (PERCOCET) 10-325 MG tablet Take 1 tablet by mouth every 8 (eight) hours as needed for pain. 09/16/23 10/16/23 Yes Cephus Collin, MD  potassium chloride SA (KLOR-CON M) 20 MEQ tablet Take 20 mEq by mouth daily. 03/27/22  Yes [provider]  pregabalin  (LYRICA ) 75 MG capsule Take 1 capsule (75 mg total) by mouth at bedtime as needed. 07/04/23 03/30/24 Yes Cephus Collin, MD  sildenafil  (REVATIO ) 20 MG tablet Take 3 to 5 tablets two hours before intercouse on an empty stomach.  Do not take with nitrates. 10/10/17  Yes McGowan, Cathleen Coach A, PA-C  tamsulosin  (FLOMAX ) 0.4 MG CAPS capsule Take 1 capsule (0.4 mg total) by mouth daily after supper. 09/05/23  Yes Chrystal, Allison Ivory, MD  tamsulosin  (FLOMAX ) 0.4 MG CAPS capsule TAKE 1 CAPSULE EVERY DAY AFTER SUPPER Patient not taking: Reported on 09/21/2023 06/23/22   Glenis Langdon, MD   Allergies  Allergen Reactions   Niacin-Lovastatin Er Other (See Comments)    unknown unknown   Atorvastatin Other (See Comments)   Other    Simvastatin Other (See Comments)   Propoxyphene Nausea Only    Passed out after taking it on an empty stomach    FAMILY HISTORY:  family history includes Heart disease in his mother. SOCIAL HISTORY:  reports that he quit smoking about 11 years ago. His smoking use included cigarettes. He started smoking about 56 years ago. He has a 67.5 pack-year smoking history. He has never used smokeless tobacco. He reports that he does not drink alcohol and does not use drugs.   Review of Systems:  Gen:  Denies  fever, sweats, chills weight loss  HEENT: Denies blurred vision, double vision, ear pain, eye pain, hearing loss, nose bleeds, sore throat Cardiac:  No dizziness, chest pain or heaviness, chest tightness,edema, No JVD Resp:   No cough, -sputum  production, -shortness of breath,-wheezing, -hemoptysis,  Gi: Denies swallowing difficulty, stomach pain, nausea or vomiting, diarrhea, constipation, bowel incontinence Gu:  Denies bladder incontinence, burning urine Ext:   Denies Joint pain, stiffness or swelling Skin: Denies  skin rash, easy bruising or bleeding or hives Endoc:  Denies polyuria, polydipsia , polyphagia or weight change Psych:   Denies depression, insomnia or hallucinations  Other:  All other systems negative  VITAL SIGNS: BP 112/80 (BP Location: Right Arm, Patient Position: Sitting, Cuff Size: Large)   Pulse 60   Temp 97.9 F (36.6 C) (Oral)   Ht 5' 7 (1.702 m)   Wt 211 lb (95.7 kg)   SpO2 96%   BMI 33.05 kg/m    Physical Examination:   General Appearance: No distress  EYES PERRLA, EOM intact.   NECK Supple, No JVD Pulmonary: normal breath sounds, No wheezing.  CardiovascularNormal S1,S2.  No m/r/g.   Abdomen: Benign, Soft, non-tender. Skin:   warm, no rashes, no ecchymosis  Extremities: normal, no cyanosis, clubbing. Neuro:without focal findings,  speech normal  PSYCHIATRIC: Mood, affect within normal limits.   ASSESSMENT AND PLAN  OSA Patient is using and benefiting from CPAP therapy. For mask leaks, advised patient to use snore tape. Discussed the consequences of untreated sleep apnea. Advised not to drive drowsy for safety of patient and others.  Will follow up in 3 months.     HTN Stable, on current management. Following with PCP.    Patient  satisfied with Plan of action and management. All questions answered  I spent a total of 31 minutes reviewing chart data, face-to-face evaluation with the patient, counseling and coordination of care as detailed above.    Rutledge Selsor, M.D.  Sleep Medicine [facility]

## 2023-09-21 NOTE — Telephone Encounter (Signed)
 Patient c/o Palpitations:  STAT if patient reporting lightheadedness, shortness of breath, or chest pain  How long have you had palpitations/irregular HR/ Afib? Are you having the symptoms now? Not sure, no  Are you currently experiencing lightheadedness, SOB or CP? no  Do you have a history of afib (atrial fibrillation) or irregular heart rhythm? no  Have you checked your BP or HR? (document readings if available): 112/80 60  Are you experiencing any other symptoms? Sob, feels faint  Went to Pulmonary doctor today and they told him he had an irregular heat beat and to call and let us  know.

## 2023-09-21 NOTE — Patient Instructions (Addendum)

## 2023-09-21 NOTE — Telephone Encounter (Signed)
 Called patient to gather further information - patient has been outside working in the yard and possible dehydration, denies SOB CP positive for lightheadedness - reviewed S&S to call 911 - patient verbalized understanding, will try to limit outdoor activity to mornings and evenings due to the temperature and humidity  He will try to decrease salt, increase water intake and exercise [facility] [facility] [employer]  Advised patient to keep follow up with Bernardo Bridgeman on 7/11

## 2023-10-03 DIAGNOSIS — R002 Palpitations: Secondary | ICD-10-CM

## 2023-10-04 ENCOUNTER — Encounter: Payer: Medicare HMO | Admitting: Nurse Practitioner

## 2023-10-04 ENCOUNTER — Telehealth: Payer: Self-pay | Admitting: Student

## 2023-10-04 ENCOUNTER — Ambulatory Visit: Payer: Self-pay | Admitting: Emergency Medicine

## 2023-10-04 DIAGNOSIS — I493 Ventricular premature depolarization: Secondary | ICD-10-CM

## 2023-10-04 NOTE — Progress Notes (Signed)
 Referral for Electrophysiology placed.

## 2023-10-04 NOTE — Telephone Encounter (Signed)
 Pt calling requesting cb with monitor results

## 2023-10-04 NOTE — Progress Notes (Signed)
Referral for EP placed.

## 2023-10-04 NOTE — Telephone Encounter (Signed)
-----   Message from Barnie Hila sent at 10/04/2023 12:45 PM EDT ----- Please let patient know heart monitor showed runs of a fast heart beat as well as frequent premature beats coming from the bottom of the heart (PVCs). I would like patient to follow up with EP for  further evaluation of PVCs.   Will you please change his follow up with me to EP?   Thank you!  DW  ----- Message ----- From: Darliss Rogue, MD Sent: 10/03/2023   8:19 AM EDT To: Barnie Hila, NP

## 2023-10-17 NOTE — Progress Notes (Deleted)
 Cardiology Clinic Note   Date: 10/17/2023 ID: Gary [name], DOB 1950/10/05, MRN [id number]  Primary Cardiologist:  Redell Cave, MD  Chief Complaint   Gary [name] is a 73 y.o. male who presents to the clinic today for ***  Patient Profile   Gary [name] is followed by *** for the history outlined below.       Past medical history significant for: Nonobstructive CAD.  Coronary CTA 07/24/2022: Coronary calcium 32 (27th percentile). Minimal RCA and LCx stenosis.  Mitral valve regurgitation. Echo 07/24/2022: EF 55-60%. No RWMA. Normal diastolic parameters. Normal RV size/function. Mild MR. Aortic valve sclerosis without stenosis.  Palpitations/PVCs. 3-day ZIO 09/27/2023: HR 59 to 226 bpm, average 90 bpm.  Predominantly sinus rhythm.  BBB/IVCD present.  4 runs of NSVT fastest/longest 15 beats max rate 226 bpm, average 192 bpm.  21 runs of SVT fastest/longest 9 beats 197 bpm, average 164 bpm.  Occasional PACs (2.1%).  Frequent PVCs (17.9%). Hypertension.  Hyperlipidemia. Lipid panel 07/06/2023: HDL 32, TG 764. Direct LDL 09/06/2023: 37. OSA. CPAP 100% adherence.  T2DM.  Prostate cancer. Former tobacco abuse.  In summary, patient was first evaluated by Dr. Cave on 06/09/2022 for nonexertional chest pain.  He had previously been followed by [facility] cardiology.  A Lexiscan in 2019 showed no ischemia and echo demonstrated normal LV function.  He underwent CT chest for lung cancer screening June 2023 which demonstrated LCx calcifications.  Patient reported statin intolerance and was tolerating Zetia and bempedoic acid.  He underwent coronary CTA which showed minimal RCA and LCx stenosis and echo which showed normal LV/RV function as detailed above.  Patient was seen in the office on 07/28/2022 for follow-up after testing.  He reported being the caretaker for his wife and helping her ambulate.  He admitted to lifting her frequently which may have caused some muscle strain.  He  was otherwise doing well and no changes were made.   Patient was last seen in the office by me on 09/06/2023 for routine follow-up.  Patient reported occasional palpitations described as heart racing.  EKG demonstrated PVCs.  3-day ZIO demonstrated 18% PVC burden as well as runs of NSVT and SVT.  Patient was referred to EP.     History of Present Illness    Today, patient ***  Nonobstructive CAD Coronary CTA April 2024 showed calcium score of 32, minimal RCA and LCx stenosis.  Patient *** - Continue aspirin, Nexlizet, fenofibrate, Zetia.   Mitral valve regurgitation Echo April 2024 showed normal LV/RV function, normal diastolic parameters, mild MR, aortic valve sclerosis without stenosis.  Patient reports he had rheumatic fever as a child. He denies lightheadedness, dizziness, presyncope or syncope.*** - Repeat echo as clinically indicated.   PVCs 3-day ZIO June 2025 demonstrated 18% PVC burden, 4 runs of NSVT, 21 runs of SVT.  Patient***EKG*** - Start Toprol  12.5 mg daily*** - Keep scheduled follow-up with EP 8/20.   Hypertension BP today***. No headaches or dizziness reported.  - Continue hydrochlorothiazide.   Hyperlipidemia/hypertriglyceridemia Lipid panel March 2025 showed TG 764.  Direct LDL 37 May 2025. - Start Nexlizet, fenofibrate, Zetia.***  ROS: All other systems reviewed and are otherwise negative except as noted in History of Present Illness.  EKGs/Labs Reviewed        No results found for requested labs within last 365 days.   No results found for requested labs within last 365 days.   No results found for requested labs within last 365 days.  No results found for requested labs within last 365 days.  ***  Risk Assessment/Calculations    {Does this patient have ATRIAL FIBRILLATION?:[phone]} No BP recorded.  {Refresh Note OR Click here to enter BP  :1}***        Physical Exam    VS:  There were no vitals taken for this visit. , BMI There is no  height or weight on file to calculate BMI.  GEN: Well nourished, well developed, in no acute distress. Neck: No JVD or carotid bruits. Cardiac: *** RRR. *** No murmur. No rubs or gallops.   Respiratory:  Respirations regular and unlabored. Clear to auscultation without rales, wheezing or rhonchi. GI: Soft, nontender, nondistended. Extremities: Radials/DP/PT 2+ and equal bilaterally. No clubbing or cyanosis. No edema ***  Skin: Warm and dry, no rash. Neuro: Strength intact.  Assessment & Plan   ***  Disposition: ***     {Are you ordering a CV Procedure (e.g. stress test, cath, DCCV, TEE, etc)?   Press F2        :[id number]}   Signed, Barnie HERO. Jamaine Quintin, DNP, NP-C

## 2023-10-19 ENCOUNTER — Ambulatory Visit: Attending: Student | Admitting: Student

## 2023-10-30 ENCOUNTER — Ambulatory Visit: Attending: Nurse Practitioner | Admitting: Nurse Practitioner

## 2023-10-30 ENCOUNTER — Encounter: Payer: Self-pay | Admitting: Nurse Practitioner

## 2023-10-30 VITALS — BP 133/79 | HR 54 | Temp 97.3°F | Ht 67.0 in | Wt 211.0 lb

## 2023-10-30 DIAGNOSIS — G5702 Lesion of sciatic nerve, left lower limb: Secondary | ICD-10-CM | POA: Insufficient documentation

## 2023-10-30 DIAGNOSIS — M47816 Spondylosis without myelopathy or radiculopathy, lumbar region: Secondary | ICD-10-CM | POA: Diagnosis present

## 2023-10-30 DIAGNOSIS — M4726 Other spondylosis with radiculopathy, lumbar region: Secondary | ICD-10-CM

## 2023-10-30 DIAGNOSIS — M533 Sacrococcygeal disorders, not elsewhere classified: Secondary | ICD-10-CM | POA: Insufficient documentation

## 2023-10-30 DIAGNOSIS — G894 Chronic pain syndrome: Secondary | ICD-10-CM | POA: Diagnosis not present

## 2023-10-30 DIAGNOSIS — M48062 Spinal stenosis, lumbar region with neurogenic claudication: Secondary | ICD-10-CM | POA: Diagnosis present

## 2023-10-30 DIAGNOSIS — G8929 Other chronic pain: Secondary | ICD-10-CM | POA: Insufficient documentation

## 2023-10-30 DIAGNOSIS — M1612 Unilateral primary osteoarthritis, left hip: Secondary | ICD-10-CM | POA: Insufficient documentation

## 2023-10-30 DIAGNOSIS — M5416 Radiculopathy, lumbar region: Secondary | ICD-10-CM | POA: Insufficient documentation

## 2023-10-30 DIAGNOSIS — Z79899 Other long term (current) drug therapy: Secondary | ICD-10-CM | POA: Insufficient documentation

## 2023-10-30 MED ORDER — OXYCODONE-ACETAMINOPHEN 10-325 MG PO TABS: 1.0000 | ORAL_TABLET | Freq: Three times a day (TID) | ORAL | 0 refills | Status: AC | PRN

## 2023-10-30 MED ORDER — OXYCODONE-ACETAMINOPHEN 10-325 MG PO TABS
1.0000 | ORAL_TABLET | Freq: Three times a day (TID) | ORAL | 0 refills | Status: AC | PRN
Start: 2024-01-02 — End: 2024-02-01

## 2023-10-30 MED ORDER — PREGABALIN 75 MG PO CAPS: 75.0000 mg | ORAL_CAPSULE | Freq: Every evening | ORAL | 2 refills | Status: DC | PRN

## 2023-10-30 MED ORDER — OXYCODONE-ACETAMINOPHEN 10-325 MG PO TABS
1.0000 | ORAL_TABLET | Freq: Three times a day (TID) | ORAL | 0 refills | Status: AC | PRN
Start: 2023-12-03 — End: 2024-01-02

## 2023-10-30 NOTE — Progress Notes (Signed)
 Nursing Pain Medication Assessment:  Safety precautions to be maintained throughout the outpatient stay will include: orient to surroundings, keep bed in low position, maintain call bell within reach at all times, provide assistance with transfer out of bed and ambulation.  Medication Inspection Compliance: Pill count conducted under aseptic conditions, in front of the patient. Neither the pills nor the bottle was removed from the patient's sight at any time. Once count was completed pills were immediately returned to the patient in their original bottle.  Medication: Oxycodone /APAP Pill/Patch Count: 9 of 90 pills/patches remain Pill/Patch Appearance: Markings consistent with prescribed medication Bottle Appearance: Standard pharmacy container. Clearly labeled. Filled Date: 6 / 41 / 2025 Last Medication intake:  TodaySafety precautions to be maintained throughout the outpatient stay will include: orient to surroundings, keep bed in low position, maintain call bell within reach at all times, provide assistance with transfer out of bed and ambulation.

## 2023-10-30 NOTE — Progress Notes (Signed)
 PROVIDER NOTE: Interpretation of information contained herein should be left to medically-trained personnel. Specific patient instructions are provided elsewhere under Patient Instructions section of medical record. This document was created in part using AI and STT-dictation technology, any transcriptional errors that may result from this process are unintentional.  Patient: Gary [name]  Service: E/M   PCP: Lenon Layman ORN, MD  DOB: April 22, 1950  DOS: 10/30/2023  Provider: Emmy MARLA Blanch, NP  MRN: [id number]  Delivery: Face-to-face  Specialty: Interventional Pain Management  Type: Established Patient  Setting: Ambulatory outpatient facility  Specialty designation: 09  Referring Prov.: Lenon Layman ORN, MD  Location: Outpatient office facility       History of present illness (HPI) [name], a 73 y.o. year old male, is here today because of his Chronic pain syndrome [G89.4]. [name] primary complain today is Leg Pain (left)  Pertinent problems: [name] has Lumbar spondylosis; Lumbar degenerative disc disease; Degenerative disc disease, cervical; Facet arthropathy, lumbar; Spinal stenosis of lumbar region with neurogenic claudication; Chronic left sacroiliac joint pain; Chronic hip pain, left; Cervical spondylosis with radiculopathy; Groin pain, chronic, right, and Chronic pain syndrome on their pertinent problem list.   Pain Assessment: Severity of Chronic pain is reported as a 7 /10. Location: Hip Lower, Left/pain radiaties down left leg. Onset: More than a month ago. Quality: Aching, Burning, Constant, Throbbing. Timing: Constant. Modifying factor(s): Meds and lay down. Vitals:  height is 5' 7 (1.702 m) and weight is 211 lb (95.7 kg). His temperature is 97.3 F (36.3 C) (abnormal). His blood pressure is 133/79 and his pulse is 54 (abnormal). His oxygen saturation is 97%.  BMI: Estimated body mass index is 33.05 kg/m as calculated from the following:   Height as  of this encounter: 5' 7 (1.702 m).   Weight as of this encounter: 211 lb (95.7 kg).  Last encounter: 07/19/2023 Last procedure: 08/13/2023  Reason for encounter: both, medication management and post-procedure evaluation and assessment. No change in medical history since last visit.  Patient's pain is at baseline.  Patient continues multimodal pain regimen as prescribed.  States that it provides pain relief and improvement in functional status.   [name] underwent a diagnostic/therapeutic sacroiliac joint injection (SI), Left TPI (trigger point injection), and hip injection on Aug 13, 2023.  The patient initially experienced approximately 40% pain relief, which was sustained for 2 to 3 days; however, the pain eventually returned to baseline following the procedure without lasting relief.  Procedure Procedure: Intra-articular hip injection  #1  Laterality: Left (-LT)  Approach: Percutaneous posterolateral approach. Level: Lower pelvic and hip joint level.  Imaging: Fluoroscopy-guided         Anesthesia: Local anesthesia (1-2% Lidocaine ) DOS: 08/13/2023  Performed by: Wallie Sherry, MD   Purpose: Diagnostic/Therapeutic Indications: Hip pain severe enough to impact quality of life or function. Rationale (medical necessity): procedure needed and proper for the diagnosis and/or treatment of Mr. Kamau medical symptoms and needs. LEFT Hip OA   NAS-11 Pain score:        Pre-procedure: 7 /10        Post-procedure: 7 /10    Procedure Procedure: Sacroiliac Joint Steroid Injection #1 and Left Piriformis TPI    Laterality: Left     Level: PIIS (Posterior Inferior Iliac Spine)  Target: Interarticular sacroiliac joint. Location: Medial to the postero-medial edge of iliac spine. Region: Lumbosacral-sacrococcygeal. Approach: Inferior postero-medial percutaneous approach. Type of procedure: Percutaneous joint injection.   Imaging: Fluoroscopy-guided Non-spinal  (  [id number]) Anesthesia: Local anesthesia (1-2% Lidocaine ) DOS: 08/13/2023  Performed by: Wallie Sherry, MD   Purpose: Diagnostic/Therapeutic Indications: Sacroiliac joint pain in the lower back and hip area severe enough to impact quality of life or function. Rationale (medical necessity): procedure needed and proper for the diagnosis and/or treatment of [name] medical symptoms and needs. Left SI joint arthritis and left piriformis syndrome   NAS-11 Pain score:        Pre-procedure: 7 /10        Post-procedure: 7 /10   Post-Procedure Evaluation     Effectiveness:  Initial hour after procedure:   40%. Subsequent 4-6 hours post-procedure:   40%. Analgesia past initial 6 hours:   40% (pain relief maintaining for 2 to 3 days; however pain returned to baseline with both procedure). Ongoing improvement:  Analgesic:  Gary [name] underwent a diagnostic/therapeutic sacroiliac joint injection (SI), Left TPI (trigger point injection), and hip injection on Aug 13, 2023.  The patient initially experienced approximately 40% pain relief, which was sustained for 2 to 3 days; however, the pain eventually returned to baseline following the procedure without lasting relief. Function: Minimal improvement ROM: Minimal improvement  Pharmacotherapy Assessment   Percocet 10-325 mg tablet every 8 hours as needed for pain.  MME=45 Lyrica  75 Mg at bedtime Monitoring: [place] PMP: PDMP reviewed during this encounter.       Pharmacotherapy: No side-effects or adverse reactions reported. Compliance: No problems identified. Effectiveness: Clinically acceptable.  Delores Dorothe LABOR, RN  10/30/2023 11:04 AM  Sign when Signing Visit Nursing Pain Medication Assessment:  Safety precautions to be maintained throughout the outpatient stay will include: orient to surroundings, keep bed in low position, maintain call bell within reach at all times, provide assistance with transfer out of bed and ambulation.   Medication Inspection Compliance: Pill count conducted under aseptic conditions, in front of the patient. Neither the pills nor the bottle was removed from the patient's sight at any time. Once count was completed pills were immediately returned to the patient in their original bottle.  Medication: Oxycodone /APAP Pill/Patch Count: 9 of 90 pills/patches remain Pill/Patch Appearance: Markings consistent with prescribed medication Bottle Appearance: Standard pharmacy container. Clearly labeled. Filled Date: 6 / 56 / 2025 Last Medication intake:  TodaySafety precautions to be maintained throughout the outpatient stay will include: orient to surroundings, keep bed in low position, maintain call bell within reach at all times, provide assistance with transfer out of bed and ambulation.     UDS:  Summary  Date Value Ref Range Status  09/21/2022 Note  Final    Comment:    ==================================================================== Tox[employer] Select 13 (MW) ==================================================================== Test                             Result       Flag       Units  Drug Present and Declared for Prescription Verification   Oxycodone                       2468         EXPECTED   ng/mg creat   Oxymorphone                    1593         EXPECTED   ng/mg creat   Noroxycodone  2677         EXPECTED   ng/mg creat   Noroxymorphone                 572          EXPECTED   ng/mg creat    Sources of oxycodone  are scheduled prescription medications.    Oxymorphone, noroxycodone, and noroxymorphone are expected    metabolites of oxycodone . Oxymorphone is also available as a    scheduled prescription medication.  ==================================================================== Test                      Result    Flag   Units      Ref Range   Creatinine              98               mg/dL       >=79 ==================================================================== Declared Medications:  The flagging and interpretation on this report are based on the  following declared medications.  Unexpected results may arise from  inaccuracies in the declared medications.   **Note: The testing scope of this panel includes these medications:   Oxycodone  (Percocet)   **Note: The testing scope of this panel does not include the  following reported medications:   Acetaminophen  (Tylenol )  Acetaminophen  (Percocet)  Albuterol (Proair HFA)  Betamethasone  (Lotrisone)  Buspirone (Buspar)  Clobetasol (Temovate)  Clotrimazole (Lotrisone)  Diclofenac (Voltaren)  Duloxetine  (Cymbalta )  Esomeprazole (Nexium)  Ezetimibe (Zetia)  Fenofibrate (TriCor)  Fluticasone (Advair)  Hydrochlorothiazide (Hydrodiuril)  Multivitamin  Potassium (Klor-Con)  Pregabalin  (Lyrica )  Salmeterol (Advair)  Sildenafil   Tamsulosin  (Flomax )  Vitamin D3 ==================================================================== For clinical consultation, please call [phone]. ====================================================================     No results found for: CBDTHCR No results found for: D8THCCBX No results found for: D9THCCBX  ROS  Constitutional: Denies any fever or chills Gastrointestinal: No reported hemesis, hematochezia, vomiting, or acute GI distress Musculoskeletal: Leg pain (left) Neurological: No reported episodes of acute onset apraxia, aphasia, dysarthria, agnosia, amnesia, paralysis, loss of coordination, or loss of consciousness  Medication Review  Albuterol Sulfate, Bempedoic Acid-Ezetimibe, DULoxetine , acetaminophen , aspirin EC, cholecalciferol, clobetasol ointment, clotrimazole-betamethasone , diclofenac sodium, esomeprazole, ezetimibe, fenofibrate, finasteride, fluticasone-salmeterol, hydrochlorothiazide, multivitamin, oxyCODONE -acetaminophen , potassium chloride SA,  pregabalin , sildenafil , and tamsulosin   History Review  Allergy: Mr. Clearman is allergic to niacin-lovastatin er, atorvastatin, other, simvastatin, and propoxyphene. Drug: [name]  reports no history of drug use. Alcohol:  reports no history of alcohol use. Tobacco:  reports that he quit smoking about 11 years ago. His smoking use included cigarettes. He started smoking about 56 years ago. He has a 67.5 pack-year smoking history. He has never used smokeless tobacco. Social: Mr. Mrozek  reports that he quit smoking about 11 years ago. His smoking use included cigarettes. He started smoking about 56 years ago. He has a 67.5 pack-year smoking history. He has never used smokeless tobacco. He reports that he does not drink alcohol and does not use drugs. Medical:  has a past medical history of Anxiety, Asthma, Benign essential HTN (06/24/2015), BPH (benign prostatic hyperplasia), COPD (chronic obstructive pulmonary disease) (HCC), Coronary artery disease (02/16/2014), DDD (degenerative disc disease), cervical, Degenerative disc disease, cervical (01/22/2017), Depression, DJD (degenerative joint disease), Emphysema of lung (HCC), Heel pain (10/10/2017), Hyperglobulinemia, Lumbar degenerative disc disease (01/22/2017), Major depression in remission (HCC) (03/10/2014), Migraines, Mitral regurgitation, Mixed hyperlipidemia (02/16/2014), Neck pain (01/24/2017), OSA (obstructive sleep apnea) (07/05/2016), Osteoarthritis of knee (08/12/2014), Prostate cancer (HCC) (  2019), RA (rheumatoid arthritis) (HCC), Spinal stenosis of lumbar region with neurogenic claudication (04/18/2016), Spondylosis without myelopathy or radiculopathy, lumbar region (01/22/2017), and Venous insufficiency of both lower extremities (11/07/2016). Surgical: Mr. Konecny  has a past surgical history that includes Cholecystectomy; Knee surgery (Left); deviated septum repair; Cardiac catheterization; Colonoscopy; Nasal septum surgery; Colonoscopy with  propofol  (N/A, 01/09/2018); and Esophagogastroduodenoscopy (egd) with propofol  (N/A, 01/09/2018). Family: family history includes Heart disease in his mother.  Laboratory Chemistry Profile   Renal Lab Results  Component Value Date   BUN 25 (H) 06/12/2022   CREATININE 0.91 06/12/2022   GFRNONAA >60 06/12/2022    Hepatic No results found for: AST, ALT, ALBUMIN, ALKPHOS, HCVAB, AMYLASE, LIPASE, AMMONIA  Electrolytes Lab Results  Component Value Date   NA 136 06/12/2022   K 4.2 06/12/2022   CL 103 06/12/2022   CALCIUM 9.5 06/12/2022    Bone Lab Results  Component Value Date   TESTOSTERONE  <3 (L) 04/29/2018    Inflammation (CRP: Acute Phase) (ESR: Chronic Phase) No results found for: CRP, ESRSEDRATE, LATICACIDVEN       Note: Above Lab results reviewed.  Recent Imaging Review  LONG TERM MONITOR (3-14 DAYS) Patch Wear Time:  3 days and 3 hours (2025-06-03T20:49:55-0400 to  2025-06-07T00:30:43-0400)  Patient had a min HR of 59 bpm, max HR of 226 bpm, and avg HR of 90 bpm.  Predominant underlying rhythm was Sinus Rhythm. Bundle Branch Block/IVCD  was present. 4 Ventricular Tachycardia runs occurred, the run with the  fastest interval lasting 15 beats  with a max rate of 226 bpm (avg 192 bpm); the run with the fastest  interval was also the longest. 21 Supraventricular Tachycardia runs  occurred, the run with the fastest interval lasting 9 beats with a max  rate of 197 bpm, the longest lasting 9 beats  with an avg rate of 164 bpm. Isolated SVEs were occasional (2.1%, 8335),  SVE Couplets were rare (<1.0%, 177), and no SVE Triplets were present.  Isolated VEs were frequent (17.9%, [id number]), VE Couplets were occasional  (2.1%, 4173), and VE Triplets were rare  (<1.0%, 452). Ventricular Bigeminy and Trigeminy were present.  Conclusion Average heart rate 90, range 59-226. Nonsustained VT noted, fastest lasting up to 15 beats. Frequent PVCs noted, 17.9%  burden. Episodes of nonsustained SVT also noted, longest lasting 9 beats. Occasional PACs noted, 2.1% burden. Note: Reviewed        Physical Exam  Vitals: BP 133/79   Pulse (!) 54   Temp (!) 97.3 F (36.3 C)   Ht 5' 7 (1.702 m)   Wt 211 lb (95.7 kg)   SpO2 97%   BMI 33.05 kg/m  BMI: Estimated body mass index is 33.05 kg/m as calculated from the following:   Height as of this encounter: 5' 7 (1.702 m).   Weight as of this encounter: 211 lb (95.7 kg). Ideal: Ideal body weight: 66.1 kg (145 lb 11.6 oz) Adjusted ideal body weight: 77.9 kg (171 lb 13.4 oz) General appearance: Well nourished, well developed, and well hydrated. In no apparent acute distress Mental status: Alert, oriented x 3 (person, place, & time)       Respiratory: No evidence of acute respiratory distress Eyes: PERLA   Assessment   Diagnosis Status  1. Chronic pain syndrome   2. Chronic radicular lumbar pain   3. Spinal stenosis of lumbar region with neurogenic claudication   4. Sacroiliac joint pain (LEFT)   5. Primary osteoarthritis of left hip  6. Piriformis syndrome of left side   7. Lumbar spondylosis   8. Medication management    Controlled Controlled Controlled   Updated Problems: Problem  Medication Management   Overview:  Had a colonoscopy 06. Pneumovax 2014. Psa's here, Dr Laurice and due for exam. Walking some. Has a living will, no living will. Full code. HCV ordered for 5-17, declines flu  Overview:  Overview:  Had a colonoscopy 06. Pneumovax 2014. Psa's here, Dr Laurice and due for exam. Walking some. Has a living will, no living will. Full code. HCV ordered for 5-17, declines flu   Formatting of this note might be different from the original. Had a colonoscopy 06, reffered back 2019. Pneumovax 2014, prevnar 13 in 2018. Psa's here, Dr Laurice and due for exam. Walking some. Has a living will, no living will. Full code. HCV ordered for 5-17, declines flu, shingrix 8-19  MEDICARE WELLNESS  VISIT  PROVIDERS RENDERING CARE Dr. Lenon, [facility] [place]antly   FUNCTIONAL ASSESSMENT  (1) Hearing: Demonstrates normal hearing in conversation.  (2) Risk of Falls: No reports of falls or abnormal balance. Gait is observed to be good upon observation.  (3) Home Safety; Home is safe and secure (4) Activities of Daily Living; Household chores and grooming are managed without problems. Personal finances are managed without problems.   DEPRESSION SCREENING There does not seem to be loss of interest in activities nor excess crying or changes in sleep or appetite.   COGNITIVE SCREENING Orientation is appropriate as are responses to questions and general conversation. No reports of forgetfulness or losing things.   PREVENTION PLAN Cardiovascular: Cholesterol follow closely  Diabetes: Yearly glucose Colon Cancer: 06 and referred back 2019 again Glaucoma: Eye exam yearly  Pneumonia: Pneumovax 2014 and prevnar 13 in 8-19 Shingles: History of zostavax and shingrix 8-19 Influenza: Yearly Smoking Cessation: NA  OTHER PERSONALIZED HEALTH ADVISE More exercise and vegetable based diet  END OF LIFE CARE WANTS Full Code    Layman Lenon MD  Formatting of this note might be different from the original. Had a colonoscopy 06, reffered back 2019. Pneumovax 2014, prevnar 13 in 2018. Psa's here, Dr Laurice and due for exam. Walking some. Has a living will, no living will. Full code. HCV ordered for 5-17, declines flu, shingrix 8-19  MEDICARE WELLNESS VISIT  PROVIDERS RENDERING CARE Dr. Lenon, [facility] [place] concomit[facility]   FUNCTIONAL ASSESSMENT  (1) Hearing: Demonstrates normal hearing in conversation.  (2) Risk of Falls: No reports of falls or abnormal balance. Gait is observed to be good upon observation.  (3) Home Safety; Home is safe and secure (4) Activities of Daily Living; Household chores and grooming are managed without problems. Personal finances are  managed without problems.   DEPRESSION SCREENING There does not seem to be loss of interest in activities nor excess crying or changes in sleep or appetite.   COGNITIVE SCREENING Orientation is appropriate as are responses to questions and general conversation. No reports of forgetfulness or losing things.   PREVENTION PLAN Cardiovascular: Cholesterol follow closely  Diabetes: Yearly glucose Colon Cancer: 06 and referred back 2019 again Glaucoma: Eye exam yearly  Pneumonia: Pneumovax 2014, and 2019 and prevnar 13 in 8-19 Shingles: History of zostavax and shingrix 8-19 Influenza: Yearly Smoking Cessation: NA  OTHER PERSONALIZED HEALTH ADVISE More exercise and vegetable based diet  END OF LIFE CARE WANTS Full Code    Layman Lenon MD     Plan of Care  Problem-specific:  Assessment and  Plan Will continue on current medication regimen.  Prescribing drug monitoring (PDMP) reviewed; findings consistent with the use of prescribed medication and no evidence of narcotic misuse or abuse. Routine UDS ordered today.  Schedule follow-up in 90 days for medication management.  No other new issues or problems reported to this visit.   Gary [name] has a current medication list which includes the following long-term medication(s): albuterol sulfate, duloxetine , esomeprazole, ezetimibe, fenofibrate, fluticasone-salmeterol, hydrochlorothiazide, and pregabalin .  Pharmacotherapy (Medications Ordered): Meds ordered this encounter  Medications   oxyCODONE -acetaminophen  (PERCOCET) 10-325 MG tablet    Sig: Take 1 tablet by mouth every 8 (eight) hours as needed for pain. Must last 30 days.    Dispense:  90 tablet    Refill:  0    Chronic Pain: STOP Act (Not applicable) Fill 1 day early if closed on refill date. Avoid benzodiazepines within 8 hours of opioids   oxyCODONE -acetaminophen  (PERCOCET) 10-325 MG tablet    Sig: Take 1 tablet by mouth every 8 (eight) hours as needed for pain.  Must last 30 days.    Dispense:  90 tablet    Refill:  0    Chronic Pain: STOP Act (Not applicable) Fill 1 day early if closed on refill date. Avoid benzodiazepines within 8 hours of opioids   oxyCODONE -acetaminophen  (PERCOCET) 10-325 MG tablet    Sig: Take 1 tablet by mouth every 8 (eight) hours as needed for pain. Must last 30 days.    Dispense:  90 tablet    Refill:  0    Chronic Pain: STOP Act (Not applicable) Fill 1 day early if closed on refill date. Avoid benzodiazepines within 8 hours of opioids   pregabalin  (LYRICA ) 75 MG capsule    Sig: Take 1 capsule (75 mg total) by mouth at bedtime as needed.    Dispense:  90 capsule    Refill:  2    Fill one day early if pharmacy is closed on scheduled refill date. May substitute for generic if available.   Orders:  Orders Placed This Encounter  Procedures   ToxASSURE Select 13 (MW), Urine    Volume: 30 ml(s). Minimum 3 ml of urine is needed. Document temperature of fresh sample. Indications: Long term (current) use of opiate analgesic (S20.108)    Release to patient:   Immediate        Return in about 3 months (around 01/30/2024) for (F2F), (MM), Emmy Blanch NP.    Recent Visits Date Type Provider Dept  08/13/23 Procedure visit Marcelino Nurse, MD [facility]  Showing recent visits within past 90 days and meeting all other requirements Today's Visits Date Type Provider Dept  10/30/23 Office Visit Deontray Hunnicutt K, NP [facility]  Showing today's visits and meeting all other requirements Future Appointments Date Type Provider Dept  01/24/24 Appointment Delrose Rohwer K, NP [facility]  Showing future appointments within next 90 days and meeting all other requirements  I discussed the assessment and treatment plan with the patient. The patient was provided an opportunity to ask questions and all were answered. The patient agreed with the plan and demonstrated an understanding of the  instructions.  Patient advised to call back or seek an in-person evaluation if the symptoms or condition worsens.  Duration of encounter: 30 minutes.  Total time on encounter, as per AMA guidelines included both the face-to-face and non-face-to-face time personally spent by the physician and/or other qualified health care professional(s) on the day of the encounter (  includes time in activities that require the physician or other qualified health care professional and does not include time in activities normally performed by clinical staff). Physician's time may include the following activities when performed: Preparing to see the patient (e.g., pre-charting review of records, searching for previously ordered imaging, lab work, and nerve conduction tests) Review of prior analgesic pharmacotherapies. Reviewing PMP Interpreting ordered tests (e.g., lab work, imaging, nerve conduction tests) Performing post-procedure evaluations, including interpretation of diagnostic procedures Obtaining and/or reviewing separately obtained history Performing a medically appropriate examination and/or evaluation Counseling and educating the patient/family/caregiver Ordering medications, tests, or procedures Referring and communicating with other health care professionals (when not separately reported) Documenting clinical information in the electronic or other health record Independently interpreting results (not separately reported) and communicating results to the patient/ family/caregiver Care coordination (not separately reported)  Note by: Demani Weyrauch K Siddhi Dornbush, NP (TTS and AI technology used. I apologize for any typographical errors that were not detected and corrected.) Date: 10/30/2023; Time: 12:02 PM

## 2023-11-07 LAB — TOXASSURE SELECT 13 (MW), URINE

## 2023-11-24 NOTE — Progress Notes (Unsigned)
 Electrophysiology Office Note:    Date:  11/28/2023   ID:  Gary [name], Gary [name] 1951-01-15, MRN [id number]  [facility] Cardiologist:  Redell Cave, MD  [facility] Electrophysiologist:  OLE ONEIDA HOLTS, MD   Referring MD: Loistine Sober, NP   Chief Complaint: pvcs  History of Present Illness:    [name] is a 73 year old man who I am seeing today for an evaluation of PVCs at the request of Sober Loistine, NP.  The patient has a history of nonobstructive coronary artery disease, hypertension, hyperlipidemia, sleep apnea on CPAP, diabetes.  He was seen by Sober Sep 06, 2023.  He was previously followed by [facility] clinic.  At that appointment he reported intermittent palpitations.  No syncope or presyncope.  EKG at that appointment showed PVCs.  A ZIO monitor was ordered.  He is doing well today.  He does not feel his palpitations.  No syncope or presyncope.  No lightheadedness or dizziness.  He is quite active.  He is the primary caregiver to his wife who has had several severe strokes in the past.  She lives at home with him and she requires assistance with ambulation.     Their past medical, social and family history was reviewed.   ROS:   Please see the history of present illness.    All other systems reviewed and are negative.  EKGs/Labs/Other Studies Reviewed:    The following studies were reviewed today:  October 03, 2023 ZIO monitor 17% PVC burden  July 26, 2022 echo EF 55-60 RV normal Mild MR  Sep 06, 2023 EKG shows sinus rhythm.  Right bundle branch block.  Left bundle morphology of PVC #1 in V1 and is negative throughout the precordium.  Left superior axis.  PVC #2 also with a left superior axis.  #2 was more narrow.  Suspect an origin closer to the conduction system.  EKG Interpretation Date/Time:  Wednesday November 28 2023 09:47:29 EDT Ventricular Rate:  85 PR Interval:  160 QRS Duration:  138 QT Interval:  424 QTC  Calculation: 504 R Axis:   101  Text Interpretation: Sinus rhythm with frequent and consecutive Premature ventricular complexes Right bundle branch block Confirmed by HOLTS OLE [phone]) on 11/28/2023 9:53:39 AM    Physical Exam:    VS:  BP 131/79   Pulse 85   Ht 5' 7 (1.702 m)   Wt 208 lb (94.3 kg)   SpO2 94%   BMI 32.58 kg/m     Wt Readings from Last 3 Encounters:  11/28/23 208 lb (94.3 kg)  10/30/23 211 lb (95.7 kg)  09/21/23 211 lb (95.7 kg)     GEN: no distress CARD: RRR, No MRG RESP: No IWOB. CTAB.        ASSESSMENT AND PLAN:    1. PVC (premature ventricular contraction)   2. Primary hypertension     #Frequent PVCs Minimal symptoms.  Previously normal left ventricular function.  The pathophysiology of his ectopy during today's clinic appointment.  I discussed the indications for treatment.  He would prefer a more conservative management strategy and I think that the risks of suppressive medications outweigh the potential benefits.  #Hypertension At goal today.  Recommend checking blood pressures 1-2 times per week at home and recording the values.  Recommend bringing these recordings to the primary care physician.  Follow-up with EP on an as-needed basis   Signed, OLE ONEIDA. HOLTS, MD, [facility], [facility] 11/28/2023 10:02 AM    Electrophysiology [facility]  HeartCare

## 2023-11-28 ENCOUNTER — Encounter: Payer: Self-pay | Admitting: Cardiology

## 2023-11-28 ENCOUNTER — Ambulatory Visit: Attending: Cardiology | Admitting: Cardiology

## 2023-11-28 VITALS — BP 131/79 | HR 85 | Ht 67.0 in | Wt 208.0 lb

## 2023-11-28 DIAGNOSIS — I493 Ventricular premature depolarization: Secondary | ICD-10-CM

## 2023-11-28 DIAGNOSIS — I10 Essential (primary) hypertension: Secondary | ICD-10-CM | POA: Diagnosis not present

## 2023-11-28 NOTE — Patient Instructions (Signed)
 Medication Instructions:  Your physician recommends that you continue on your current medications as directed. Please refer to the Current Medication list given to you today.  *If you need a refill on your cardiac medications before your next appointment, please call your pharmacy*  Follow-Up: At [facility], you and your health needs are our priority.  As part of our continuing mission to provide you with exceptional heart care, our providers are all part of one team.  This team includes your primary Cardiologist (physician) and Advanced Practice Providers or APPs (Physician Assistants and Nurse Practitioners) who all work together to provide you with the care you need, when you need it.  Your next appointment:   As needed with Dr. Cindie

## 2023-12-24 ENCOUNTER — Ambulatory Visit: Admitting: Sleep Medicine

## 2024-01-08 ENCOUNTER — Ambulatory Visit: Admitting: Sleep Medicine

## 2024-01-08 DIAGNOSIS — Z9889 Other specified postprocedural states: Secondary | ICD-10-CM | POA: Insufficient documentation

## 2024-01-08 DIAGNOSIS — K2270 Barrett's esophagus without dysplasia: Secondary | ICD-10-CM | POA: Insufficient documentation

## 2024-01-08 DIAGNOSIS — I259 Chronic ischemic heart disease, unspecified: Secondary | ICD-10-CM | POA: Insufficient documentation

## 2024-01-17 ENCOUNTER — Ambulatory Visit: Admitting: Sleep Medicine

## 2024-01-24 ENCOUNTER — Ambulatory Visit: Admitting: Nurse Practitioner

## 2024-01-24 DIAGNOSIS — G8929 Other chronic pain: Secondary | ICD-10-CM

## 2024-01-24 DIAGNOSIS — G894 Chronic pain syndrome: Secondary | ICD-10-CM

## 2024-01-24 DIAGNOSIS — M48062 Spinal stenosis, lumbar region with neurogenic claudication: Secondary | ICD-10-CM

## 2024-01-24 DIAGNOSIS — Z91199 Patient's noncompliance with other medical treatment and regimen due to unspecified reason: Secondary | ICD-10-CM

## 2024-01-24 NOTE — Progress Notes (Signed)
 01/24/2024-No show

## 2024-02-04 ENCOUNTER — Encounter: Payer: Self-pay | Admitting: Nurse Practitioner

## 2024-02-04 ENCOUNTER — Ambulatory Visit: Attending: Nurse Practitioner | Admitting: Nurse Practitioner

## 2024-02-04 VITALS — BP 106/62 | HR 80 | Temp 97.3°F | Resp 16 | Ht 67.0 in | Wt 199.0 lb

## 2024-02-04 DIAGNOSIS — M1612 Unilateral primary osteoarthritis, left hip: Secondary | ICD-10-CM | POA: Diagnosis present

## 2024-02-04 DIAGNOSIS — Z79899 Other long term (current) drug therapy: Secondary | ICD-10-CM | POA: Diagnosis present

## 2024-02-04 DIAGNOSIS — G894 Chronic pain syndrome: Secondary | ICD-10-CM | POA: Diagnosis present

## 2024-02-04 DIAGNOSIS — M533 Sacrococcygeal disorders, not elsewhere classified: Secondary | ICD-10-CM | POA: Insufficient documentation

## 2024-02-04 DIAGNOSIS — M5416 Radiculopathy, lumbar region: Secondary | ICD-10-CM | POA: Insufficient documentation

## 2024-02-04 DIAGNOSIS — M48062 Spinal stenosis, lumbar region with neurogenic claudication: Secondary | ICD-10-CM | POA: Insufficient documentation

## 2024-02-04 DIAGNOSIS — G5702 Lesion of sciatic nerve, left lower limb: Secondary | ICD-10-CM | POA: Insufficient documentation

## 2024-02-04 DIAGNOSIS — G8929 Other chronic pain: Secondary | ICD-10-CM | POA: Insufficient documentation

## 2024-02-04 MED ORDER — OXYCODONE-ACETAMINOPHEN 10-325 MG PO TABS: 1.0000 | ORAL_TABLET | Freq: Three times a day (TID) | ORAL | 0 refills | Status: DC | PRN

## 2024-02-04 MED ORDER — PREGABALIN 75 MG PO CAPS: 75.0000 mg | ORAL_CAPSULE | Freq: Every evening | ORAL | 2 refills | Status: DC | PRN

## 2024-02-04 MED ORDER — OXYCODONE-ACETAMINOPHEN 10-325 MG PO TABS: 1.0000 | ORAL_TABLET | Freq: Three times a day (TID) | ORAL | 0 refills | Status: AC | PRN

## 2024-02-04 NOTE — Patient Instructions (Signed)

## 2024-02-04 NOTE — Progress Notes (Signed)
 PROVIDER NOTE: Interpretation of information contained herein should be left to medically-trained personnel. Specific patient instructions are provided elsewhere under Patient Instructions section of medical record. This document was created in part using AI and STT-dictation technology, any transcriptional errors that may result from this process are unintentional.  Patient: Gary [name]  Service: E/M   PCP: Lenon Layman ORN, MD  DOB: 1950-06-14  DOS: 02/04/2024  Provider: Emmy MARLA Blanch, NP  MRN: [id number]  Delivery: Face-to-face  Specialty: Interventional Pain Management  Type: Established Patient  Setting: Ambulatory outpatient facility  Specialty designation: 09  Referring Prov.: Lenon Layman ORN, MD  Location: Outpatient office facility       History of present illness (HPI) Gary [name], a 73 y.o. year old male, is here today because of his Primary osteoarthritis of left hip [M16.12]. Gary [name] primary complain today is Hip Pain (Bilateral, left is worse ), Back Pain (Lumbar bilateral left is worse ), and Leg Pain (Left, feeling that it may give out )  Pertinent problems: Gary [name]  has Lumbar spondylosis; Lumbar degenerative disc disease; Degenerative disc disease, cervical; Facet arthropathy, lumbar; Spinal stenosis of lumbar region with neurogenic claudication; Chronic left sacroiliac joint pain; Chronic hip pain, left; Cervical spondylosis with radiculopathy; Groin pain, chronic, right, and Chronic pain syndrome on their pertinent problem list.  Pain Assessment: Severity of Chronic pain is reported as a 9 /10. Location: Hip (bilateral lumbar) Right, Left/? back pain into hips and thigh on the left  and down legs. Onset: More than a month ago. Quality: Discomfort, Constant, Dull. Timing: Constant. Modifying factor(s): nothing currently. Vitals:  height is 5' 7 (1.702 m) and weight is 199 lb (90.3 kg). His temporal temperature is 97.3 F (36.3 C) (abnormal). His  blood pressure is 106/62 and his pulse is 80. His respiration is 16 and oxygen saturation is 97%.  BMI: Estimated body mass index is 31.17 kg/m as calculated from the following:   Height as of this encounter: 5' 7 (1.702 m).   Weight as of this encounter: 199 lb (90.3 kg).  Last encounter: 01/24/2024. Last procedure: Visit date not found.  Reason for encounter: evaluation for possible interventional PM therapy/treatment and medication management. No change in medical history since last visit.  Patient's pain is at baseline.  Patient continues multimodal pain regimen as prescribed.  States that it provides pain relief and improvement in functional status.   Discussed the use of AI scribe software for clinical note transcription with the patient, who gave verbal consent to proceed.  History of Present Illness   Gary [name] is a 73 year old male who presents with left hip and leg pain. He continuous experiences pain in his left buttocks, left hip, and down his left leg, which he believes may be related to the sciatic nerve. The pain radiates from his lower back to his hip and leg and is described as 'aggravating' and 'raw', but not burning or stinging.  He is currently taking oxycodone  10-325 mg every eight hours and Lyrica  (pregabalin ) 75 mg at bedtime. These medications provide some relief but do not significantly alleviate the pain.  Of note, he received a left SI joint and left piriformis trigger point injection on Aug 13, 2023, which provided approximately 5 months of pain relief and improvement in functional mobility before pain returned to baseline.  The patient is having a flareup of left-sided SI joint and piriformis syndrome.  We discussed repeating both injection with an  increased volume for better coverage and pain relief. Pharmacotherapy Assessment   Percocet 10-325 mg tablet every 8 hours as needed for pain.  MME=45 Lyrica  75 Mg at bedtime Monitoring: [place] PMP: PDMP  reviewed during this encounter.       Pharmacotherapy: No side-effects or adverse reactions reported. Compliance: No problems identified. Effectiveness: Clinically acceptable.  Gary Chrissie MATSU, RN  02/04/2024 11:01 AM  Sign when Signing Visit Nursing Pain Medication Assessment:  Safety precautions to be maintained throughout the outpatient stay will include: orient to surroundings, keep bed in low position, maintain call bell within reach at all times, provide assistance with transfer out of bed and ambulation.  Medication Inspection Compliance: Pill count conducted under aseptic conditions, in front of the patient. Neither the pills nor the bottle was removed from the patient's sight at any time. Once count was completed pills were immediately returned to the patient in their original bottle.  Medication: Oxycodone /APAP Pill/Patch Count: 77 of 90 pills/patches remain Pill/Patch Appearance: Markings consistent with prescribed medication Bottle Appearance: Standard pharmacy container. Clearly labeled. Filled Date: 61 / 23 / 2025 Last Medication intake:  Today    UDS:  Summary  Date Value Ref Range Status  10/31/2023 FINAL  Final    Comment:    ==================================================================== [employer] Select 13 (MW) ==================================================================== Test                             Result       Flag       Units  Drug Present   Oxycodone                       1623                    ng/mg creat   Oxymorphone                    174                     ng/mg creat   Noroxycodone                   1528                    ng/mg creat   Noroxymorphone                 275                     ng/mg creat    Sources of oxycodone  are scheduled prescription medications.    Oxymorphone, noroxycodone, and noroxymorphone are expected    metabolites of oxycodone . Oxymorphone is also available as a    scheduled prescription  medication.  ==================================================================== Test                      Result    Flag   Units      Ref Range   Creatinine              142              mg/dL      >=79 ==================================================================== Declared Medications:  Medication list was not provided. ==================================================================== For clinical consultation, please call [phone]. ====================================================================     No results found for: CBDTHCR No results found for: D8THCCBX No results found for: D9THCCBX  ROS  Constitutional: Denies any fever or chills Gastrointestinal: No reported hemesis, hematochezia, vomiting, or acute GI distress Musculoskeletal: Left hip pain and left SI joint pain Neurological: No reported episodes of acute onset apraxia, aphasia, dysarthria, agnosia, amnesia, paralysis, loss of coordination, or loss of consciousness  Medication Review  Albuterol Sulfate, Bempedoic Acid-Ezetimibe, DULoxetine , acetaminophen , aspirin EC, cholecalciferol, clobetasol ointment, clotrimazole-betamethasone , diclofenac sodium, esomeprazole, ezetimibe, fenofibrate, finasteride, fluticasone-salmeterol, hydrochlorothiazide, multivitamin, oxyCODONE -acetaminophen , potassium chloride SA, pregabalin , sildenafil , and tamsulosin   History Review  Allergy: Gary [name] is allergic to niacin-lovastatin er, atorvastatin, other, simvastatin, and propoxyphene. Drug: Gary [name]  reports no history of drug use. Alcohol:  reports no history of alcohol use. Tobacco:  reports that he quit smoking about 11 years ago. His smoking use included cigarettes. He started smoking about 56 years ago. He has a 67.5 pack-year smoking history. He has never used smokeless tobacco. Social: Gary [name]  reports that he quit smoking about 11 years ago. His smoking use included cigarettes. He started smoking  about 56 years ago. He has a 67.5 pack-year smoking history. He has never used smokeless tobacco. He reports that he does not drink alcohol and does not use drugs. Medical:  has a past medical history of Anxiety, Asthma, Benign essential HTN (06/24/2015), BPH (benign prostatic hyperplasia), COPD (chronic obstructive pulmonary disease) (HCC), Coronary artery disease (02/16/2014), DDD (degenerative disc disease), cervical, Degenerative disc disease, cervical (01/22/2017), Depression, DJD (degenerative joint disease), Emphysema of lung (HCC), Heel pain (10/10/2017), Hyperglobulinemia, Lumbar degenerative disc disease (01/22/2017), Major depression in remission (03/10/2014), Migraines, Mitral regurgitation, Mixed hyperlipidemia (02/16/2014), Neck pain (01/24/2017), OSA (obstructive sleep apnea) (07/05/2016), Osteoarthritis of knee (08/12/2014), Prostate cancer (HCC) (2019), RA (rheumatoid arthritis) (HCC), Spinal stenosis of lumbar region with neurogenic claudication (04/18/2016), Spondylosis without myelopathy or radiculopathy, lumbar region (01/22/2017), and Venous insufficiency of both lower extremities (11/07/2016). Surgical: Gary [name]  has a past surgical history that includes Cholecystectomy; Knee surgery (Left); deviated septum repair; Cardiac catheterization; Colonoscopy; Nasal septum surgery; Colonoscopy with propofol  (N/A, 01/09/2018); and Esophagogastroduodenoscopy (egd) with propofol  (N/A, 01/09/2018). Family: family history includes Heart disease in his mother.  Laboratory Chemistry Profile   Renal Lab Results  Component Value Date   BUN 25 (H) 06/12/2022   CREATININE 0.91 06/12/2022   GFRNONAA >60 06/12/2022    Hepatic No results found for: AST, ALT, ALBUMIN, ALKPHOS, HCVAB, AMYLASE, LIPASE, AMMONIA  Electrolytes Lab Results  Component Value Date   NA 136 06/12/2022   K 4.2 06/12/2022   CL 103 06/12/2022   CALCIUM 9.5 06/12/2022    Bone Lab Results  Component Value Date    TESTOSTERONE  <3 (L) 04/29/2018    Inflammation (CRP: Acute Phase) (ESR: Chronic Phase) No results found for: CRP, ESRSEDRATE, LATICACIDVEN       Note: Above Lab results reviewed.  Recent Imaging Review  LONG TERM MONITOR (3-14 DAYS) Patch Wear Time:  3 days and 3 hours (2025-06-03T20:49:55-0400 to  2025-06-07T00:30:43-0400)  Patient had a min HR of 59 bpm, max HR of 226 bpm, and avg HR of 90 bpm.  Predominant underlying rhythm was Sinus Rhythm. Bundle Branch Block/IVCD  was present. 4 Ventricular Tachycardia runs occurred, the run with the  fastest interval lasting 15 beats  with a max rate of 226 bpm (avg 192 bpm); the run with the fastest  interval was also the longest. 21 Supraventricular Tachycardia runs  occurred, the run with the fastest interval lasting 9 beats with a max  rate of 197 bpm, the longest lasting 9 beats  with an avg rate of 164 bpm. Isolated SVEs were occasional (2.1%, 8335),  SVE Couplets were rare (<1.0%, 177), and no SVE Triplets were present.  Isolated VEs were frequent (17.9%, [id number]), VE Couplets were occasional  (2.1%, 4173), and VE Triplets were rare  (<1.0%, 452). Ventricular Bigeminy and Trigeminy were present.  Conclusion Average heart rate 90, range 59-226. Nonsustained VT noted, fastest lasting up to 15 beats. Frequent PVCs noted, 17.9% burden. Episodes of nonsustained SVT also noted, longest lasting 9 beats. Occasional PACs noted, 2.1% burden. Note: Reviewed        Physical Exam  Vitals: BP 106/62 (BP Location: Right Arm, Patient Position: Sitting, Cuff Size: Normal)   Pulse 80   Temp (!) 97.3 F (36.3 C) (Temporal)   Resp 16   Ht 5' 7 (1.702 m)   Wt 199 lb (90.3 kg)   SpO2 97%   BMI 31.17 kg/m  BMI: Estimated body mass index is 31.17 kg/m as calculated from the following:   Height as of this encounter: 5' 7 (1.702 m).   Weight as of this encounter: 199 lb (90.3 kg). Ideal: Ideal body weight: 66.1 kg (145 lb 11.6  oz) Adjusted ideal body weight: 75.8 kg (167 lb 0.6 oz) General appearance: Well nourished, well developed, and well hydrated. In no apparent acute distress Mental status: Alert, oriented x 3 (person, place, & time)       Respiratory: No evidence of acute respiratory distress Eyes: PERLA  Musculoskeletal: + Left hip pain, left SI joint pain Lumbar Exam  Skin & Axial Inspection: No masses, redness, or swelling Alignment: Symmetrical Functional ROM: Pain restricted ROM affecting primarily the left Stability: No instability detected Muscle Tone/Strength: Functionally intact. No obvious neuro-muscular anomalies detected. Sensory (Neurological): Referred pain pattern Palpation: No palpable anomalies       Provocative Tests: Hyperextension/rotation test: deferred today       Lumbar quadrant test (Kemp's test): deferred today       Lateral bending test: deferred today       Patrick's Maneuver: (+) for left-sided S-I arthralgia             FABER* test: (+) for left-sided S-I arthralgia  S-I anterior distraction/compression test: (+) Left-sided S-I arthralgia/arthropathy S-I lateral compression test: Positive for Left-sided S-I arthralgia/arthropathy S-I Thigh-thrust test: (+) Left-sided S-I arthralgia/arthropathy S-I Gaenslen's test: Positive for Left-sided S-I arthralgia/arthropathy  *(Flexion, ABduction and External Rotation)  Hip Joint Findings: Tenderness to palpation over the anterior hip/groin Pain with passive internal and external rotation of the left hip (especially when flexed to 90) Positive FADIR test (pain with flexion, adduction, internal rotation)  Assessment   Diagnosis Status  1. Primary osteoarthritis of left hip   2. Sacroiliac joint pain (LEFT)   3. Piriformis syndrome of left side   4. Chronic radicular lumbar pain   5. Spinal stenosis of lumbar region with neurogenic claudication   6. Chronic pain syndrome   7. Medication management    Having a  Flare-up Having a Flare-up Having a Flare-up   Updated Problems: No problems updated.  Plan of Care  Problem-specific:  Assessment and Plan    Sacroiliac joint pain (left): The patient continues experiencing chronic left hip pain and SI joint pain, along with the left anterior hip discomfort, which limits his mobility and functional activities.  Physical examination and imaging findings are consistent with left sacroiliac joint dysfunction and intra articular left hip pathology.  - Schedule repeat SI joint injection with increased volume. -  Continue oxycodone  10-325 mg mg every 8 hours as needed. - Continue pregabalin  75 mg at bedtime.  Chronic left hip pain (OA of left hip): Chronic left hip pain possibly related to sciatic nerve dysfunction. Previous hip injections provided temporary relief. - Schedule repeat hip injection with increased volume.  Patient's pain is well-controlled with oxycodone  and Lyrica , will continue on current medication regimen.  Prescribing drug monitoring (PMP) reviewed, findings consistent with the use of prescribed medication and no evidence of narcotic misuse or abuse.  Urine drug screening (UDS) up to date and consistent with the use of prescribed medication.  The patient was advised drink more water to prevent opioid induced constipation.  She will follow-up in 90 days for medication management.  Piriformis syndrome of left side:  Plan: (Clinic): (L) SI joint and Piriformis injection # 2 and (L) IA hip injection # 2 with Dr. Marcelino        Gary [name] has a current medication list which includes the following long-term medication(s): albuterol sulfate, duloxetine , esomeprazole, ezetimibe, fenofibrate, fluticasone-salmeterol, hydrochlorothiazide, and pregabalin .  Pharmacotherapy (Medications Ordered): Meds ordered this encounter  Medications   oxyCODONE -acetaminophen  (PERCOCET) 10-325 MG tablet    Sig: Take 1 tablet by mouth every 8 (eight)  hours as needed for pain. Must last 30 days.    Dispense:  90 tablet    Refill:  0    Chronic Pain: STOP Act (Not applicable) Fill 1 day early if closed on refill date. Avoid benzodiazepines within 8 hours of opioids   oxyCODONE -acetaminophen  (PERCOCET) 10-325 MG tablet    Sig: Take 1 tablet by mouth every 8 (eight) hours as needed for pain. Must last 30 days.    Dispense:  90 tablet    Refill:  0    Chronic Pain: STOP Act (Not applicable) Fill 1 day early if closed on refill date. Avoid benzodiazepines within 8 hours of opioids   oxyCODONE -acetaminophen  (PERCOCET) 10-325 MG tablet    Sig: Take 1 tablet by mouth every 8 (eight) hours as needed for pain. Must last 30 days.    Dispense:  90 tablet    Refill:  0    Chronic Pain: STOP Act (Not applicable) Fill 1 day early if closed on refill date. Avoid benzodiazepines within 8 hours of opioids   pregabalin  (LYRICA ) 75 MG capsule    Sig: Take 1 capsule (75 mg total) by mouth at bedtime as needed.    Dispense:  90 capsule    Refill:  2    Fill one day early if pharmacy is closed on scheduled refill date. May substitute for generic if available.   Orders:  Orders Placed This Encounter  Procedures   SACROILIAC JOINT INJECTION    Physical Examination Findings: Positive Sacral Thrust (Sacral Spring, Downward Pressure): (Y) Positive FABER maneuver (Patrick's): (Y) Positive SI distraction (Gapping): (Y) Positive SI compression (Approximation): (Y) Positive Thigh Thrust:  (Y) Positive Gaenslen's: (Y) Positive Sacral Sulcus Tenderness: (Y)    Standing Status:   Future    Expiration Date:   05/06/2024    Scheduling Instructions:     Procedure: Sacroiliac Joint Injection     Side  Laterality: Left-sided     Sedation: Patient's choice.     Timeframe: As soon as schedule allows.    Where will this procedure be performed?:   [facility]   TRIGGER POINT INJECTION    Area: Buttocks region (gluteal area) Indications: Piriformis muscle  pain; Left (G57.02) piriformis-syndrome; piriformis muscle spasms (  F37.161). CPT code: 79447    Scheduling Instructions:     Type: Myoneural block (TPI) of piriformis muscle.     Side: Left     Sedation: Patient's choice.     Timeframe: ASAA    Where will this procedure be performed?:   [facility]   HIP INJECTION    Standing Status:   Future    Expiration Date:   05/06/2024    Scheduling Instructions:     Procedure: Intra-articular Hip joint injection     Side: Left-sided     Approach: Percutaneous posterolateral approach.      Sedation: Patient's choice.     Timeframe: As soon as schedule allows        Return in about 3 weeks (around 02/25/2024) for [facility]): (L) SI joint and Piriformis injection # 2 and (L) IA hip injection # 2 with Dr. Marcelino .    Recent Visits Date Type Provider Dept  01/24/24 Office Visit Chaise Mahabir K, NP [facility]  Showing recent visits within past 90 days and meeting all other requirements Today's Visits Date Type Provider Dept  02/04/24 Office Visit Takeira Yanes K, NP [facility]  Showing today's visits and meeting all other requirements Future Appointments No visits were found meeting these conditions. Showing future appointments within next 90 days and meeting all other requirements  I discussed the assessment and treatment plan with the patient. The patient was provided an opportunity to ask questions and all were answered. The patient agreed with the plan and demonstrated an understanding of the instructions.  Patient advised to call back or seek an in-person evaluation if the symptoms or condition worsens.  I personally spent a total of 30 minutes in the care of the patient today including preparing to see the patient, getting/reviewing separately obtained history, performing a medically appropriate exam/evaluation, counseling and educating, placing orders, referring and communicating with other health care  professionals, documenting clinical information in the EHR, independently interpreting results, communicating results, and coordinating care.   Note by: Marixa Mellott K Tkai Large, NP (TTS and AI technology used. I apologize for any typographical errors that were not detected and corrected.) Date: 02/04/2024; Time: 12:45 PM

## 2024-02-04 NOTE — Progress Notes (Signed)
 Nursing Pain Medication Assessment:  Safety precautions to be maintained throughout the outpatient stay will include: orient to surroundings, keep bed in low position, maintain call bell within reach at all times, provide assistance with transfer out of bed and ambulation.  Medication Inspection Compliance: Pill count conducted under aseptic conditions, in front of the patient. Neither the pills nor the bottle was removed from the patient's sight at any time. Once count was completed pills were immediately returned to the patient in their original bottle.  Medication: Oxycodone /APAP Pill/Patch Count: 77 of 90 pills/patches remain Pill/Patch Appearance: Markings consistent with prescribed medication Bottle Appearance: Standard pharmacy container. Clearly labeled. Filled Date: 31 / 23 / 2025 Last Medication intake:  Today

## 2024-02-08 ENCOUNTER — Telehealth: Payer: Self-pay | Admitting: Student in an Organized Health Care Education/Training Program

## 2024-02-08 NOTE — Telephone Encounter (Signed)
 Wife states patient is in bad shape and needs to come in for injections asap. Please call patient

## 2024-02-08 NOTE — Telephone Encounter (Signed)
 Seema has put in orders for procedures.  Nathanel notified.

## 2024-02-11 ENCOUNTER — Other Ambulatory Visit: Payer: Self-pay | Admitting: Nurse Practitioner

## 2024-02-11 ENCOUNTER — Telehealth: Payer: Self-pay | Admitting: Student in an Organized Health Care Education/Training Program

## 2024-02-11 DIAGNOSIS — G8929 Other chronic pain: Secondary | ICD-10-CM

## 2024-02-11 DIAGNOSIS — G894 Chronic pain syndrome: Secondary | ICD-10-CM

## 2024-02-11 DIAGNOSIS — M533 Sacrococcygeal disorders, not elsewhere classified: Secondary | ICD-10-CM

## 2024-02-11 MED ORDER — PREDNISONE 20 MG PO TABS: ORAL_TABLET | ORAL | 0 refills | Status: AC

## 2024-02-11 NOTE — Telephone Encounter (Signed)
 PT stated and wife that he is really deteriorating. He cannot hardly walk.

## 2024-02-11 NOTE — Telephone Encounter (Signed)
 Patient wants to know if a chiropractor would help with the pinched nerve. Would like a call back

## 2024-02-12 ENCOUNTER — Telehealth: Payer: Self-pay

## 2024-02-12 NOTE — Telephone Encounter (Signed)
 Dr Marcelino, the patient has called stating that his procedure had been denied and that they would need you to call them and give medical necessity.  [facility]  [phone] option #1

## 2024-02-14 ENCOUNTER — Telehealth: Payer: Self-pay | Admitting: Student in an Organized Health Care Education/Training Program

## 2024-02-14 NOTE — Telephone Encounter (Signed)
 PT stated he went to urgent care was advise to go to ER last night he did not. He wants to know is his meds is causing the problem or his cane that he walks with.

## 2024-02-14 NOTE — Telephone Encounter (Signed)
 Patient has called back re; severe pain, he went to urgent care on 02/09/24 and was told to go the ED which he did not do.  He is taking oxycodone  - apap 10-325 mg every 8 hours, steroid taper and toggling ibuprofen and tylenol  for the pain.  He is also using a cane.  Pain is in his left hip and buttock and down back side of left leg to the ankle/foot. I am told that his ordered procedure was denied d/t inadequate information or criteria.  He feels that possibly the cane is throwing his gait off.  Reports that he has a hematoma above the groin.  No pain there.  They are wondering if this is coming from use of the cane to walk.

## 2024-02-15 ENCOUNTER — Other Ambulatory Visit: Payer: Self-pay | Admitting: Physician Assistant

## 2024-02-15 ENCOUNTER — Ambulatory Visit
Admission: RE | Admit: 2024-02-15 | Discharge: 2024-02-15 | Disposition: A | Discharge status: Home / Self Care | Source: Ambulatory Visit | Attending: Physician Assistant | Admitting: Physician Assistant

## 2024-02-15 DIAGNOSIS — R1084 Generalized abdominal pain: Secondary | ICD-10-CM | POA: Insufficient documentation

## 2024-02-15 LAB — POCT I-STAT CREATININE: Creatinine, Ser: 1 mg/dL (ref 0.61–1.24)

## 2024-02-15 MED ORDER — IOHEXOL 300 MG/ML  SOLN
100.0000 mL | Freq: Once | INTRAMUSCULAR | Status: AC | PRN
  Administered 2024-02-15: 100 mL via INTRAVENOUS

## 2024-02-18 ENCOUNTER — Other Ambulatory Visit: Payer: Self-pay | Admitting: Nurse Practitioner

## 2024-02-18 ENCOUNTER — Telehealth: Payer: Self-pay | Admitting: Student in an Organized Health Care Education/Training Program

## 2024-02-18 ENCOUNTER — Telehealth: Payer: Self-pay

## 2024-02-18 DIAGNOSIS — M1612 Unilateral primary osteoarthritis, left hip: Secondary | ICD-10-CM

## 2024-02-18 DIAGNOSIS — M48061 Spinal stenosis, lumbar region without neurogenic claudication: Secondary | ICD-10-CM

## 2024-02-18 DIAGNOSIS — G8929 Other chronic pain: Secondary | ICD-10-CM

## 2024-02-18 DIAGNOSIS — M48062 Spinal stenosis, lumbar region with neurogenic claudication: Secondary | ICD-10-CM

## 2024-02-18 NOTE — Telephone Encounter (Signed)
 Patient had to go to the ED over the weekend. He spoke with ortho surgeon about the CT done. He was recommended for surgery. Want Dr Marcelino to look at the test and recommend what he should do.

## 2024-02-18 NOTE — Telephone Encounter (Signed)
 I called the patient at [facility] request to see if the patient wanted to come in for an lesi because his LINDSAY was denied. He stated the last one did not work. He said after his CT scan the doctor suggested kyphoplasty. He wants to know if it would be a good idea to be sent to a neurosurgeon.

## 2024-02-18 NOTE — Telephone Encounter (Signed)
 Informed patient of CT results and he said he is still in a lot of pain  but constipation has resolved some.

## 2024-02-20 ENCOUNTER — Other Ambulatory Visit: Payer: Self-pay | Admitting: Physician Assistant

## 2024-02-20 DIAGNOSIS — S32050G Wedge compression fracture of fifth lumbar vertebra, subsequent encounter for fracture with delayed healing: Secondary | ICD-10-CM

## 2024-02-21 ENCOUNTER — Ambulatory Visit: Admit: 2024-02-21 | Admitting: Ophthalmology

## 2024-02-21 ENCOUNTER — Other Ambulatory Visit

## 2024-02-21 SURGERY — PHACOEMULSIFICATION, CATARACT, WITH IOL INSERTION
Anesthesia: Topical | Laterality: Left

## 2024-02-26 NOTE — Progress Notes (Incomplete)
 Chief Complaint: Patient was seen in consultation today for No chief complaint on file.  at the request of Tumey,Robert  Referring Physician(s): Tumey,Robert  History of Present Illness: Gary [name] is a 73 y.o. male ***  Past Medical History:  Diagnosis Date   Anxiety    Asthma    Benign essential HTN 06/24/2015   Last Assessment & Plan:  Taking medications without noted side effects or dizziness.   Overview:  Last Assessment & Plan:  Is compliant with hypertensive medications without clear side effects or lack of control.     BPH (benign prostatic hyperplasia)    COPD (chronic obstructive pulmonary disease) (HCC)    Coronary artery disease 02/16/2014   Overview:  Minimal disease by cath 12/13  Last Assessment & Plan:  Seems to be tolerating medical regimen without significant side effects and symptoms such as worsening chest pain are not noted.   Overview:  Overview:  Minimal disease by cath 12/13  Last Assessment & Plan:  Seems to be tolerating medical regimen without significant side effects and symptoms such as worsening chest pain are not no   DDD (degenerative disc disease), cervical    Degenerative disc disease, cervical 01/22/2017   Depression    DJD (degenerative joint disease)    Emphysema of lung (HCC)    Heel pain 10/10/2017   Hyperglobulinemia    Lumbar degenerative disc disease 01/22/2017   Major depression in remission 03/10/2014   Last Assessment & Plan:  Mood is doing well on meds Overview:  Last Assessment & Plan:  Mood is doing well on meds    Migraines    Mitral regurgitation    Mixed hyperlipidemia 02/16/2014   Last Assessment & Plan:  Diet for healthy cholesterol is being attempted and no clear myalgia's or other side effects are noted.   Overview:  Last Assessment & Plan:  Low fat diet is being attempted and no significant side effects such as myalgia's are noted.     Neck pain 01/24/2017   OSA (obstructive sleep apnea) 07/05/2016   Last Assessment  & Plan:  Continues to use cpap consistently and is continuing to benefit from it's use.     Osteoarthritis of knee 08/12/2014   Overview:  Right knee is worse now  Last Assessment & Plan:  Diffuse pain on back in hips and knees also. Went to a pain clinic  Overview:  Overview:  Right knee is worse now  Last Assessment & Plan:  Diffuse pain on back in hips and knees also. Went to a pain clinic    Prostate cancer (HCC) 2019   Rad Tx's.    RA (rheumatoid arthritis) (HCC)    Spinal stenosis of lumbar region with neurogenic claudication 04/18/2016   Spondylosis without myelopathy or radiculopathy, lumbar region 01/22/2017   Venous insufficiency of both lower extremities 11/07/2016    Past Surgical History:  Procedure Laterality Date   CARDIAC CATHETERIZATION     CHOLECYSTECTOMY     COLONOSCOPY     COLONOSCOPY WITH PROPOFOL  N/A 01/09/2018   Procedure: COLONOSCOPY WITH PROPOFOL ;  Surgeon: Toledo, Ladell POUR, MD;  Location: [facility] ENDOSCOPY;  Service: Gastroenterology;  Laterality: N/A;   deviated septum repair     ESOPHAGOGASTRODUODENOSCOPY (EGD) WITH PROPOFOL  N/A 01/09/2018   Procedure: ESOPHAGOGASTRODUODENOSCOPY (EGD) WITH PROPOFOL ;  Surgeon: Toledo, Ladell POUR, MD;  Location: [facility] ENDOSCOPY;  Service: Gastroenterology;  Laterality: N/A;   KNEE SURGERY Left    NASAL SEPTUM SURGERY  Allergies: Niacin-lovastatin er, Atorvastatin, Other, Simvastatin, and Propoxyphene  Medications: Prior to Admission medications   Medication Sig Start Date End Date Taking? Authorizing Provider  acetaminophen  (TYLENOL ) 500 MG tablet Take as needed by mouth.     [provider]  Albuterol Sulfate 108 (90 Base) MCG/ACT AEPB Inhale as needed into the lungs.     [provider]  aspirin EC 81 MG tablet Take 1 tablet by mouth daily. 12/27/22   [provider]  cholecalciferol (VITAMIN D) 1000 units tablet Take by mouth.    [provider]  clobetasol ointment (TEMOVATE) 0.05 % Apply 1  application topically 2 (two) times daily.    [provider]  clotrimazole-betamethasone  (LOTRISONE) cream Apply 1 application topically 2 (two) times daily.    [provider]  diclofenac sodium (VOLTAREN) 1 % GEL Apply topically 4 (four) times daily.    [provider]  DULoxetine  (CYMBALTA ) 60 MG capsule Take 1 capsule (60 mg total) by mouth daily. 06/05/23 05/30/24  Marcelino Nurse, MD  esomeprazole (NEXIUM) 40 MG capsule Take 40 mg by mouth daily. 08/30/18   [provider]  ezetimibe (ZETIA) 10 MG tablet Take 10 mg by mouth daily.  08/21/17   [provider]  fenofibrate (TRICOR) 48 MG tablet Take 48 mg by mouth daily.    [provider]  finasteride (PROSCAR) 5 MG tablet Take 1 tablet by mouth daily. 08/21/23   [provider]  fluticasone-salmeterol (ADVAIR) 100-50 MCG/ACT AEPB Inhale into the lungs. 05/31/21 02/04/24  [provider]  hydrochlorothiazide (HYDRODIURIL) 12.5 MG tablet Take 12.5 mg by mouth daily.    [provider]  Multiple Vitamin (MULTIVITAMIN) tablet Take 1 tablet by mouth daily.    [provider]  NEXLIZET 180-10 MG TABS Take 1 tablet by mouth daily. 08/18/23   [provider]  oxyCODONE -acetaminophen  (PERCOCET) 10-325 MG tablet Take 1 tablet by mouth every 8 (eight) hours as needed for pain. Must last 30 days. 03/01/24 03/31/24  Patel, Seema K, NP  oxyCODONE -acetaminophen  (PERCOCET) 10-325 MG tablet Take 1 tablet by mouth every 8 (eight) hours as needed for pain. Must last 30 days. 03/31/24 04/30/24  Patel, Seema K, NP  oxyCODONE -acetaminophen  (PERCOCET) 10-325 MG tablet Take 1 tablet by mouth every 8 (eight) hours as needed for pain. Must last 30 days. 04/30/24 05/30/24  Patel, Seema K, NP  potassium chloride SA (KLOR-CON M) 20 MEQ tablet Take 20 mEq by mouth daily. 03/27/22   [provider]  pregabalin  (LYRICA ) 75 MG capsule Take 1 capsule (75 mg total) by mouth at  bedtime as needed. 02/04/24 10/31/24  Patel, Seema K, NP  sildenafil  (REVATIO ) 20 MG tablet Take 3 to 5 tablets two hours before intercouse on an empty stomach.  Do not take with nitrates. 10/10/17   McGowan, Clotilda A, PA-C  tamsulosin  (FLOMAX ) 0.4 MG CAPS capsule TAKE 1 CAPSULE EVERY DAY AFTER SUPPER 06/23/22   Lenn Aran, MD  tamsulosin  (FLOMAX ) 0.4 MG CAPS capsule Take 1 capsule (0.4 mg total) by mouth daily after supper. 09/05/23   Lenn Aran, MD     Family History  Problem Relation Age of Onset   Heart disease Mother     Social History   Socioeconomic History   Marital status: Married    Spouse name: Not on file   Number of children: Not on file   Years of education: Not on file   Highest education level: Not on file  Occupational History  Not on file  Tobacco Use   Smoking status: Former    Current packs/day: 0.00    Average packs/day: 1.5 packs/day for 45.0 years (67.5 ttl pk-yrs)    Types: Cigarettes    Start date: 73    Quit date: 2014    Years since quitting: 11.8   Smokeless tobacco: Never  Vaping Use   Vaping status: Never Used  Substance and Sexual Activity   Alcohol use: No   Drug use: No   Sexual activity: Not on file  Other Topics Concern   Not on file  Social History Narrative   Not on file   Social Drivers of Health   Financial Resource Strain: Not on file  Food Insecurity: Not on file  Transportation Needs: Not on file  Physical Activity: Not on file  Stress: Not on file  Social Connections: Not on file    ECOG Status: {CHL ONC ECOG ED:8845999799}  Review of Systems: A 12 point ROS discussed and pertinent positives are indicated in the HPI above.  All other systems are negative.  Review of Systems  Vital Signs: There were no vitals taken for this visit.  Physical Exam  Mallampati Score:     Imaging: CT ABDOMEN PELVIS W CONTRAST Result Date: 02/15/2024 EXAM: CT ABDOMEN AND PELVIS WITH CONTRAST 02/15/2024 03:02:57 PM  TECHNIQUE: CT of the abdomen and pelvis was performed with the administration of 100 mL of iohexol  (OMNIPAQUE ) 300 MG/ML solution. Multiplanar reformatted images are provided for review. Automated exposure control, iterative reconstruction, and/or weight-based adjustment of the mA/kV was utilized to reduce the radiation dose to as low as reasonably achievable. COMPARISON: Lumbar spine MRI 09/27/2022. CLINICAL HISTORY: Bruising in the right abdomen and left hip. FINDINGS: LOWER CHEST: No acute abnormality. LIVER: There is fatty infiltration of the liver. GALLBLADDER AND BILE DUCTS: Gallbladder is surgically absent. Pneumobilia is present. No biliary ductal dilatation. SPLEEN: No acute abnormality. PANCREAS: No acute abnormality. ADRENAL GLANDS: No acute abnormality. KIDNEYS, URETERS AND BLADDER: No stones in the kidneys or ureters. No hydronephrosis. No perinephric or periureteral stranding. Urinary bladder is unremarkable. GI AND BOWEL: The stomach is decompressed. There is a large amount of stool throughout the colon. The appendix appears normal. There is no bowel obstruction. PERITONEUM AND RETROPERITONEUM: No ascites. No free air. VASCULATURE: Aorta is normal in caliber. LYMPH NODES: No lymphadenopathy. REPRODUCTIVE ORGANS: No acute abnormality. BONES AND SOFT TISSUES: Multilevel degenerative changes of the thoracic spine. There is acute/subacute fracture of the inferior endplate of L5 with 10% vertebral body height loss. There are small fat-containing bilateral inguinal hernias. There is also a small fat-containing umbilical hernia. No focal body wall hematoma identified. IMPRESSION: 1. Acute/subacute fracture of the inferior endplate of L5 with approximately 10% vertebral body height loss. 2. Status post cholecystectomy with pneumobilia, correlate with biliary instrumentation. 3. Hepatic steatosis. Electronically signed by: Greig Pique MD 02/15/2024 03:34 PM EST RP Workstation: [id number]     Labs:  CBC: No results for input(s): WBC, HGB, HCT, PLT in the last 8760 hours.  COAGS: No results for input(s): INR, APTT in the last 8760 hours.  BMP: Recent Labs    02/15/24 1444  CREATININE 1.00    LIVER FUNCTION TESTS: No results for input(s): BILITOT, AST, ALT, ALKPHOS, PROT, ALBUMIN in the last 8760 hours.  TUMOR MARKERS: No results for input(s): AFPTM, CEA, CA199, CHROMGRNA in the last 8760 hours.  Assessment and Plan:  ***  Thank you for this interesting consult.  I greatly enjoyed meeting  Gary [name] and look forward to participating in their care.  A copy of this report was sent to the requesting provider on this date.  Electronically Signed:  Thom Hall, MD [facility]   Pager. [phone] Clinic. [phone]  I spent a total of {New INPT:304952001} {New Out-Pt:304952002}  {Established Out-Pt:304952003} in face to face in clinical consultation, greater than 50% of which was counseling/coordinating care for ***

## 2024-02-27 ENCOUNTER — Ambulatory Visit: Payer: Self-pay

## 2024-02-27 NOTE — Telephone Encounter (Signed)
 I spoke with the patient. He has been seeing his PCP for this problem. He does not need anything from our office at this time. He will call back if he needs anything going forward.   Nothing further needed.

## 2024-02-27 NOTE — Telephone Encounter (Signed)
 FYI Only or Action Required?: Action required by provider: clinical question for provider.  Patient was last seen in primary care on NA.  Called Nurse Triage reporting Sciatica.  Symptoms began yesterday.  Interventions attempted: Nothing.  Symptoms are: unchanged.  Triage Disposition: No disposition on file.  Patient/caregiver understands and will follow disposition?:   Copied from CRM [id number]. Topic: Clinical - Red Word Triage >> Feb 27, 2024 10:09 AM Gary [name] wrote: Red Word that prompted transfer to Nurse Triage: Severe pain from hip all the way down to foot, and sudden bruising under right side of arm. Answer Assessment - Initial Assessment Questions Appt tomorrow for L5 back procedure, for hip and back pain  Reports brusing, no falls or injuries, no pain; already seen primary care regarding brusing. CT scan done, no internal bleeding. Onset, 4 weeks ago; does not take blood thinners  Requesting free imaging for checking lungs for patient's with hx of cancer. Seen spot on lung from previous CT Scan.  Answer Assessment - Initial Assessment Questions 1. REASON FOR CALL: What is the main reason for your call? or How can I best help you?  Patient reports:   Appt tomorrow for L5 back procedure, for hip and back pain  Reports brusing, no falls or injuries, no pain; already seen primary care regarding brusing. CT scan done, no internal bleeding. Onset, 4 weeks ago; does not take blood thinners  Requesting free imaging for checking lungs for patient's with hx of cancer. Seen spot on lung from previous CT Scan.  Patient asked questions about bruising being related to previous CT scan of lungs.  Offered to schedule appt with pulmonologist, patient declines and reports will call back after appt tomorrow.  Advised patient to call primary care provider.  Protocols used: Back Pain-A-AH, Information Only Call - No Triage-A-AH

## 2024-02-28 ENCOUNTER — Inpatient Hospital Stay
Admission: RE | Admit: 2024-02-28 | Discharge: 2024-02-28 | Disposition: A | Source: Ambulatory Visit | Attending: Physician Assistant

## 2024-02-28 ENCOUNTER — Ambulatory Visit
Admission: RE | Admit: 2024-02-28 | Discharge: 2024-02-28 | Disposition: A | Discharge status: Home / Self Care | Source: Ambulatory Visit | Attending: Interventional Radiology | Admitting: Interventional Radiology

## 2024-02-28 ENCOUNTER — Other Ambulatory Visit: Payer: Self-pay | Admitting: Interventional Radiology

## 2024-02-28 DIAGNOSIS — S32050G Wedge compression fracture of fifth lumbar vertebra, subsequent encounter for fracture with delayed healing: Secondary | ICD-10-CM | POA: Diagnosis present

## 2024-02-28 HISTORY — PX: IR RADIOLOGIST EVAL & MGMT: IMG5224

## 2024-02-28 NOTE — Consult Note (Addendum)
 Reason for visit: Symptomatic L5 vertebral fracture. Failed conservative management Referred by Suzzane Charleston PA   Care Team(s) Primary Care; Lenon Layman ORN, MD Pain Medicine; Marcelino Nurse, MD  History of Present Illness:  [name] is a 73 y.o. male with a history of cervical and lumbar degenerative disc disease, arthritis, lumbar stenosis and spondylosis, and lumbar facet arthropathy who was recently evaluated by his PCP for increasing lower back pain with radiation down his left leg. Patient is followed by Pain Medicine for his osteoarthritis and lower back pain. He has undergone several previous SI joint injections, lumbar epidural injections, and a lumbar facet nerve block most recently as of 08/2023.  Pt is joined by his spouse Gary [name]) who is a reliable historian, and they report that he was helping her in the yard in mid October and could not get out of bed on the 3rd day of working because of low back pain. He also had a large bruise that formed at his R flank but denied any fall, injury or accident.  He reported worsening back pain to his PCP and subsequently underwent recent CT AP (02/15/24) which demonstrated an acute/subacute fracture of the inferior endplate of L5 with approximately 10% vertebral body height loss.   He describes; a focused, low-back pain which radiates laterally to his L gluteus, thigh and down the L leg. 10/10 at it's worst and only mildly relieved with sitting or laying down. He has been taking a mixture of tylenol , alleve and prescribed opiate with minimal relief.  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 He reports a significant decrease in his QoL given his low back pain. ROS was positive for dysuria, chronic on flomax  and more recently constipation with his pain medication.   Review  of Systems: A 12-point ROS discussed, and pertinent positives are indicated in the HPI above.  All other systems are negative.   Past Medical History:  Diagnosis Date   Anxiety    Asthma    Benign essential HTN 06/24/2015   Last Assessment & Plan:  Taking medications without noted side effects or dizziness.   Overview:  Last Assessment & Plan:  Is compliant with hypertensive medications without clear side effects or lack of control.     BPH (benign prostatic hyperplasia)    COPD (chronic obstructive pulmonary disease) (HCC)    Coronary artery disease 02/16/2014   Overview:  Minimal disease by cath 12/13  Last Assessment & Plan:  Seems to be tolerating medical regimen without significant side effects and symptoms such as worsening chest pain are not noted.   Overview:  Overview:  Minimal disease by cath 12/13  Last Assessment & Plan:  Seems to be tolerating medical regimen without significant side effects and symptoms such as worsening chest pain are not no   DDD (degenerative disc disease), cervical    Degenerative disc disease, cervical 01/22/2017   Depression    DJD (degenerative joint disease)    Emphysema of lung (HCC)    Heel pain 10/10/2017   Hyperglobulinemia    Lumbar degenerative disc disease 01/22/2017   Major depression in remission 03/10/2014   Last Assessment & Plan:  Mood is doing well on meds Overview:  Last Assessment & Plan:  Mood is doing well on meds    Migraines    Mitral regurgitation  Mixed hyperlipidemia 02/16/2014   Last Assessment & Plan:  Diet for healthy cholesterol is being attempted and no clear myalgia's or other side effects are noted.   Overview:  Last Assessment & Plan:  Low fat diet is being attempted and no significant side effects such as myalgia's are noted.     Neck pain 01/24/2017   OSA (obstructive sleep apnea) 07/05/2016   Last Assessment & Plan:  Continues to use cpap consistently and is continuing to benefit from it's use.     Osteoarthritis of knee  08/12/2014   Overview:  Right knee is worse now  Last Assessment & Plan:  Diffuse pain on back in hips and knees also. Went to a pain clinic  Overview:  Overview:  Right knee is worse now  Last Assessment & Plan:  Diffuse pain on back in hips and knees also. Went to a pain clinic    Prostate cancer (HCC) 2019   Rad Tx's.    RA (rheumatoid arthritis) (HCC)    Spinal stenosis of lumbar region with neurogenic claudication 04/18/2016   Spondylosis without myelopathy or radiculopathy, lumbar region 01/22/2017   Venous insufficiency of both lower extremities 11/07/2016    Past Surgical History:  Procedure Laterality Date   CARDIAC CATHETERIZATION     CHOLECYSTECTOMY     COLONOSCOPY     COLONOSCOPY WITH PROPOFOL  N/A 01/09/2018   Procedure: COLONOSCOPY WITH PROPOFOL ;  Surgeon: Toledo, Ladell POUR, MD;  Location: [facility] ENDOSCOPY;  Service: Gastroenterology;  Laterality: N/A;   deviated septum repair     ESOPHAGOGASTRODUODENOSCOPY (EGD) WITH PROPOFOL  N/A 01/09/2018   Procedure: ESOPHAGOGASTRODUODENOSCOPY (EGD) WITH PROPOFOL ;  Surgeon: Toledo, Ladell POUR, MD;  Location: [facility] ENDOSCOPY;  Service: Gastroenterology;  Laterality: N/A;   IR RADIOLOGIST EVAL & MGMT  02/28/2024   KNEE SURGERY Left    NASAL SEPTUM SURGERY      Allergies: Niacin-lovastatin er, Atorvastatin, Other, Simvastatin, and Propoxyphene  Medications: Prior to Admission medications   Medication Sig Start Date End Date Taking? Authorizing Provider  acetaminophen  (TYLENOL ) 500 MG tablet Take as needed by mouth.     [provider]  Albuterol Sulfate 108 (90 Base) MCG/ACT AEPB Inhale as needed into the lungs.     [provider]  aspirin EC 81 MG tablet Take 1 tablet by mouth daily. 12/27/22   [provider]  cholecalciferol (VITAMIN D) 1000 units tablet Take by mouth.    [provider]  clobetasol ointment (TEMOVATE) 0.05 % Apply 1 application topically 2 (two) times daily.    [provider]   clotrimazole-betamethasone  (LOTRISONE) cream Apply 1 application topically 2 (two) times daily.    [provider]  diclofenac sodium (VOLTAREN) 1 % GEL Apply topically 4 (four) times daily.    [provider]  DULoxetine  (CYMBALTA ) 60 MG capsule Take 1 capsule (60 mg total) by mouth daily. 06/05/23 05/30/24  Marcelino Nurse, MD  esomeprazole (NEXIUM) 40 MG capsule Take 40 mg by mouth daily. 08/30/18   [provider]  ezetimibe (ZETIA) 10 MG tablet Take 10 mg by mouth daily.  08/21/17   [provider]  fenofibrate (TRICOR) 48 MG tablet Take 48 mg by mouth daily.    [provider]  finasteride (PROSCAR) 5 MG tablet Take 1 tablet by mouth daily. 08/21/23   [provider]  fluticasone-salmeterol (ADVAIR) 100-50 MCG/ACT AEPB Inhale into the lungs. 05/31/21 02/04/24  [provider]  hydrochlorothiazide (HYDRODIURIL) 12.5 MG tablet Take 12.5 mg by mouth daily.  [provider]  Multiple Vitamin (MULTIVITAMIN) tablet Take 1 tablet by mouth daily.    [provider]  NEXLIZET 180-10 MG TABS Take 1 tablet by mouth daily. 08/18/23   [provider]  oxyCODONE -acetaminophen  (PERCOCET) 10-325 MG tablet Take 1 tablet by mouth every 8 (eight) hours as needed for pain. Must last 30 days. 03/01/24 03/31/24  Patel, Seema K, NP  oxyCODONE -acetaminophen  (PERCOCET) 10-325 MG tablet Take 1 tablet by mouth every 8 (eight) hours as needed for pain. Must last 30 days. 03/31/24 04/30/24  Patel, Seema K, NP  oxyCODONE -acetaminophen  (PERCOCET) 10-325 MG tablet Take 1 tablet by mouth every 8 (eight) hours as needed for pain. Must last 30 days. 04/30/24 05/30/24  Patel, Seema K, NP  potassium chloride SA (KLOR-CON M) 20 MEQ tablet Take 20 mEq by mouth daily. 03/27/22   [provider]  pregabalin  (LYRICA ) 75 MG capsule Take 1 capsule (75 mg total) by mouth at bedtime as needed. 02/04/24 10/31/24  Patel, Seema K, NP  sildenafil   (REVATIO ) 20 MG tablet Take 3 to 5 tablets two hours before intercouse on an empty stomach.  Do not take with nitrates. 10/10/17   McGowan, Clotilda A, PA-C  tamsulosin  (FLOMAX ) 0.4 MG CAPS capsule TAKE 1 CAPSULE EVERY DAY AFTER SUPPER 06/23/22   Lenn Aran, MD  tamsulosin  (FLOMAX ) 0.4 MG CAPS capsule Take 1 capsule (0.4 mg total) by mouth daily after supper. 09/05/23   Lenn Aran, MD     Family History  Problem Relation Age of Onset   Heart disease Mother     Social History   Socioeconomic History   Marital status: Married    Spouse name: Not on file   Number of children: Not on file   Years of education: Not on file   Highest education level: Not on file  Occupational History   Not on file  Tobacco Use   Smoking status: Former    Current packs/day: 0.00    Average packs/day: 1.5 packs/day for 45.0 years (67.5 ttl pk-yrs)    Types: Cigarettes    Start date: 89    Quit date: 2014    Years since quitting: 11.8   Smokeless tobacco: Never  Vaping Use   Vaping status: Never Used  Substance and Sexual Activity   Alcohol use: No   Drug use: No   Sexual activity: Not on file  Other Topics Concern   Not on file  Social History Narrative   Not on file   Social Drivers of Health   Financial Resource Strain: Not on file  Food Insecurity: Not on file  Transportation Needs: Not on file  Physical Activity: Not on file  Stress: Not on file  Social Connections: Not on file    Review of Systems As above  Vital Signs: BP 113/61 (BP Location: Left Arm, Patient Position: Sitting, Cuff Size: Normal)   Pulse 61   Temp 98.1 F (36.7 C) (Oral)   Resp 16   Wt 87.1 kg   SpO2 98%   BMI 30.07 kg/m   Physical Exam Musculoskeletal:       Back:     Comments: Low back point tenderness    General: WD / WN elderly male, in moderate discomfort  CV: RRR on monitor Pulm: normal work of breathing on RA Abd: S, ND, NT MSK: using a rolling walker. R flank bruising.  Psych:  Appropriate affect.   Imaging:  CT AP (02/15/24)  Imaging independently reviewed, demonstrating a mildy displaced  L5 vertebral body fracture    IR Radiologist Eval & Mgmt Result Date: 02/28/2024 EXAM: NEW PATIENT OFFICE VISIT CHIEF COMPLAINT: See below HISTORY OF PRESENT ILLNESS: See below REVIEW OF SYSTEMS: See below PHYSICAL EXAMINATION: See below ASSESSMENT AND PLAN: Please refer to completed note in the electronic medical record on [place] [facility] Thom Hall, MD [facility] Electronically Signed   By: Thom Hall M.D.   On: 02/28/2024 14:39   CT ABDOMEN PELVIS W CONTRAST Result Date: 02/15/2024 EXAM: CT ABDOMEN AND PELVIS WITH CONTRAST 02/15/2024 03:02:57 PM TECHNIQUE: CT of the abdomen and pelvis was performed with the administration of 100 mL of iohexol  (OMNIPAQUE ) 300 MG/ML solution. Multiplanar reformatted images are provided for review. Automated exposure control, iterative reconstruction, and/or weight-based adjustment of the mA/kV was utilized to reduce the radiation dose to as low as reasonably achievable. COMPARISON: Lumbar spine MRI 09/27/2022. CLINICAL HISTORY: Bruising in the right abdomen and left hip. FINDINGS: LOWER CHEST: No acute abnormality. LIVER: There is fatty infiltration of the liver. GALLBLADDER AND BILE DUCTS: Gallbladder is surgically absent. Pneumobilia is present. No biliary ductal dilatation. SPLEEN: No acute abnormality. PANCREAS: No acute abnormality. ADRENAL GLANDS: No acute abnormality. KIDNEYS, URETERS AND BLADDER: No stones in the kidneys or ureters. No hydronephrosis. No perinephric or periureteral stranding. Urinary bladder is unremarkable. GI AND BOWEL: The stomach is decompressed. There is a large amount of stool throughout the colon. The appendix appears normal. There is no bowel obstruction. PERITONEUM AND RETROPERITONEUM: No ascites. No free air. VASCULATURE: Aorta is normal in caliber. LYMPH  NODES: No lymphadenopathy. REPRODUCTIVE ORGANS: No acute abnormality. BONES AND SOFT TISSUES: Multilevel degenerative changes of the thoracic spine. There is acute/subacute fracture of the inferior endplate of L5 with 10% vertebral body height loss. There are small fat-containing bilateral inguinal hernias. There is also a small fat-containing umbilical hernia. No focal body wall hematoma identified. IMPRESSION: 1. Acute/subacute fracture of the inferior endplate of L5 with approximately 10% vertebral body height loss. 2. Status post cholecystectomy with pneumobilia, correlate with biliary instrumentation. 3. Hepatic steatosis. Electronically signed by: Greig Pique MD 02/15/2024 03:34 PM EST RP Workstation: [id number]    Labs:  CBC: No results for input(s): WBC, HGB, HCT, PLT in the last 8760 hours.  COAGS: No results for input(s): INR, APTT in the last 8760 hours.  BMP: Recent Labs    02/15/24 1444  CREATININE 1.00    Assessment and Plan:  73 y/o M w PMHx significant for lumbar DJD and stenosis w acute, atraumatic L5 vertebral body fractures on CT  AP (02/15/24).   Ongoing, life-style limiting pain secondary to the fractures, refractory to conservative management.  Pain on exam concordant with level of fracture, Failure of conservative therapy and pain refractory to pain mediation, and significant disability on the [employer] Disability Questionnaire with 22/24 positive symptoms, reflecting significant impact/impairment of (ADLs)   ICD-10-CM Codes that Support Medical Necessity ([email or web address].[email or web address]?[email or web address]) S32.059A     Fracture of L5 lumbar vertebra, initial encounter for closed fracture   He is interested in pursuing a minimally-invasive option for the treatment of his lumbar fracture at this time, and is curious about L5 VERTEBRAL AUGMENTATION with KYPHOPLASTY.    The procedure has been fully reviewed with the  patient/patient's authorized representative. The risks, benefits and alternatives have been explained, and the patient/patient's authorized representative has consented to the procedure.   *CT AP (02/15/24) reviewed. MR L spine WO ordered, to be performed ASAP. *Proceed to  schedule based on mutual availability. Soonest available per Pt request given significant discomfort.   *Pt based in [place] and with limited mobility and transport. To be performed at [facility] or [facility], earliest available.  *[employer] KP representative support requested. *Same day procedure, no overnight admission. *Ancef for pre op Abx and ASA hold, 3 days. *Bone density testing w DEXA, Vit D and Ca2+ supplementation per PCP   Per CMS PQRS reporting requirements (PQRS Measure 24): Given the patient's age of greater than 50 and the fracture site (hip, distal radius, or spine), the patient should be tested for osteoporosis using DXA, and the appropriate treatment considered based on the DXA results.  Thank you for this interesting consult.  I greatly enjoyed meeting DEZMAN GRANDA and look forward to participating in their care.  A copy of this report was sent to the requesting provider on this date.  Electronically Signed:  Thom Hall, MD [facility]   Pager. [phone] Clinic. [phone]  I spent a total of 60 Minutes of face-to-face time in clinical consultation, greater than 50% of which was counseling/coordinating care for Mr. KASEN ADDUCI evaluation for symptomatic L5 vertebral body fracture.

## 2024-02-29 ENCOUNTER — Other Ambulatory Visit: Payer: Self-pay | Admitting: Interventional Radiology

## 2024-02-29 DIAGNOSIS — S32050A Wedge compression fracture of fifth lumbar vertebra, initial encounter for closed fracture: Secondary | ICD-10-CM

## 2024-02-29 NOTE — Progress Notes (Signed)
 Patient for IR L5 Kyphoplasty on Mon 03/03/24, I called and spoke with the patient on the phone and gave pre-procedure instructions. Pt was made aware to be here at 12p, NPO after MN prior to procedure as well as driver post procedure/recovery/discharge. Pt stated understanding.  Called 02/29/24  Dr Hughes said that the pt is ok with not taking his ASA 81mg  starting Friday 02/29/24

## 2024-02-29 NOTE — Progress Notes (Deleted)
 Referring Physician:  Tobie Emmy POUR, NP [address] [place],  [place] [address]  Primary Physician:  Lenon Layman ORN, MD  History of Present Illness: 02/29/2024 [name] is here today with a chief complaint of *** Scheduled for [facility] on 11/24 L5 compression fracture with delayed healing  increasing lower back pain with radiation down left leg   Duration: *** Location: *** Quality: *** Severity: ***  Precipitating: aggravated by *** Modifying factors: made better by *** Weakness: none Timing: *** Bowel/Bladder Dysfunction: none  Conservative measures:  Physical therapy: *** has not participated in?  Multimodal medical therapy including regular antiinflammatories: *** tylenol , Cymbalta , Meloxicam, Lyrica , Prednisone , Oxycodone  Injections: *** epidural steroid injections  Past Surgery: ***none  Gary [name] has ***no symptoms of cervical myelopathy.  The symptoms are causing a significant impact on the patient's life.   I have utilized the care everywhere function in epic to review the outside records available from external health systems.  Review of Systems:  A 10 point review of systems is negative, except for the pertinent positives and negatives detailed in the HPI.  Past Medical History: Past Medical History:  Diagnosis Date   Anxiety    Asthma    Benign essential HTN 06/24/2015   Last Assessment & Plan:  Taking medications without noted side effects or dizziness.   Overview:  Last Assessment & Plan:  Is compliant with hypertensive medications without clear side effects or lack of control.     BPH (benign prostatic hyperplasia)    COPD (chronic obstructive pulmonary disease) (HCC)    Coronary artery disease 02/16/2014   Overview:  Minimal disease by cath 12/13  Last Assessment & Plan:  Seems to be tolerating medical regimen without significant side effects and symptoms such as worsening chest pain are not noted.    Overview:  Overview:  Minimal disease by cath 12/13  Last Assessment & Plan:  Seems to be tolerating medical regimen without significant side effects and symptoms such as worsening chest pain are not no   DDD (degenerative disc disease), cervical    Degenerative disc disease, cervical 01/22/2017   Depression    DJD (degenerative joint disease)    Emphysema of lung (HCC)    Heel pain 10/10/2017   Hyperglobulinemia    Lumbar degenerative disc disease 01/22/2017   Major depression in remission 03/10/2014   Last Assessment & Plan:  Mood is doing well on meds Overview:  Last Assessment & Plan:  Mood is doing well on meds    Migraines    Mitral regurgitation    Mixed hyperlipidemia 02/16/2014   Last Assessment & Plan:  Diet for healthy cholesterol is being attempted and no clear myalgia's or other side effects are noted.   Overview:  Last Assessment & Plan:  Low fat diet is being attempted and no significant side effects such as myalgia's are noted.     Neck pain 01/24/2017   OSA (obstructive sleep apnea) 07/05/2016   Last Assessment & Plan:  Continues to use cpap consistently and is continuing to benefit from it's use.     Osteoarthritis of knee 08/12/2014   Overview:  Right knee is worse now  Last Assessment & Plan:  Diffuse pain on back in hips and knees also. Went to a pain clinic  Overview:  Overview:  Right knee is worse now  Last Assessment & Plan:  Diffuse pain on back in hips and knees also. Went to a pain clinic  Prostate cancer (HCC) 2019   Rad Tx's.    RA (rheumatoid arthritis) (HCC)    Spinal stenosis of lumbar region with neurogenic claudication 04/18/2016   Spondylosis without myelopathy or radiculopathy, lumbar region 01/22/2017   Venous insufficiency of both lower extremities 11/07/2016    Past Surgical History: Past Surgical History:  Procedure Laterality Date   CARDIAC CATHETERIZATION     CHOLECYSTECTOMY     COLONOSCOPY     COLONOSCOPY WITH PROPOFOL  N/A 01/09/2018   Procedure:  COLONOSCOPY WITH PROPOFOL ;  Surgeon: Toledo, Ladell POUR, MD;  Location: [facility];  Service: Gastroenterology;  Laterality: N/A;   deviated septum repair     ESOPHAGOGASTRODUODENOSCOPY (EGD) WITH PROPOFOL  N/A 01/09/2018   Procedure: ESOPHAGOGASTRODUODENOSCOPY (EGD) WITH PROPOFOL ;  Surgeon: Toledo, Ladell POUR, MD;  Location: [facility];  Service: Gastroenterology;  Laterality: N/A;   IR RADIOLOGIST EVAL & MGMT  02/28/2024   KNEE SURGERY Left    NASAL SEPTUM SURGERY      Allergies: Allergies as of 03/10/2024 - Review Complete 02/28/2024  Allergen Reaction Noted   Niacin-lovastatin er Other (See Comments) 01/24/2017   Atorvastatin Other (See Comments) 02/24/2015   Other  09/09/2015   Simvastatin Other (See Comments) 03/04/2014   Propoxyphene Nausea Only 09/22/2013    Medications:  Current Outpatient Medications:    acetaminophen  (TYLENOL ) 500 MG tablet, Take as needed by mouth. , Disp: , Rfl:    Albuterol Sulfate 108 (90 Base) MCG/ACT AEPB, Inhale as needed into the lungs. , Disp: , Rfl:    aspirin EC 81 MG tablet, Take 1 tablet by mouth daily., Disp: , Rfl:    cholecalciferol (VITAMIN D) 1000 units tablet, Take by mouth., Disp: , Rfl:    clobetasol ointment (TEMOVATE) 0.05 %, Apply 1 application topically 2 (two) times daily., Disp: , Rfl:    clotrimazole-betamethasone  (LOTRISONE) cream, Apply 1 application topically 2 (two) times daily., Disp: , Rfl:    diclofenac sodium (VOLTAREN) 1 % GEL, Apply topically 4 (four) times daily., Disp: , Rfl:    DULoxetine  (CYMBALTA ) 60 MG capsule, Take 1 capsule (60 mg total) by mouth daily., Disp: 30 capsule, Rfl: 11   esomeprazole (NEXIUM) 40 MG capsule, Take 40 mg by mouth daily., Disp: , Rfl:    ezetimibe (ZETIA) 10 MG tablet, Take 10 mg by mouth daily. , Disp: , Rfl:    fenofibrate (TRICOR) 48 MG tablet, Take 48 mg by mouth daily., Disp: , Rfl:    finasteride (PROSCAR) 5 MG tablet, Take 1 tablet by mouth daily., Disp: , Rfl:     fluticasone-salmeterol (ADVAIR) 100-50 MCG/ACT AEPB, Inhale into the lungs., Disp: , Rfl:    hydrochlorothiazide (HYDRODIURIL) 12.5 MG tablet, Take 12.5 mg by mouth daily., Disp: , Rfl:    Multiple Vitamin (MULTIVITAMIN) tablet, Take 1 tablet by mouth daily., Disp: , Rfl:    naproxen sodium (ALEVE) 220 MG tablet, Take 220 mg by mouth 2 (two) times daily as needed., Disp: , Rfl:    NEXLIZET 180-10 MG TABS, Take 1 tablet by mouth daily., Disp: , Rfl:    [START ON 03/01/2024] oxyCODONE -acetaminophen  (PERCOCET) 10-325 MG tablet, Take 1 tablet by mouth every 8 (eight) hours as needed for pain. Must last 30 days., Disp: 90 tablet, Rfl: 0   [START ON 03/31/2024] oxyCODONE -acetaminophen  (PERCOCET) 10-325 MG tablet, Take 1 tablet by mouth every 8 (eight) hours as needed for pain. Must last 30 days., Disp: 90 tablet, Rfl: 0   [START ON 04/30/2024] oxyCODONE -acetaminophen  (PERCOCET) 10-325 MG tablet, Take 1  tablet by mouth every 8 (eight) hours as needed for pain. Must last 30 days., Disp: 90 tablet, Rfl: 0   potassium chloride SA (KLOR-CON M) 20 MEQ tablet, Take 20 mEq by mouth daily., Disp: , Rfl:    pregabalin  (LYRICA ) 75 MG capsule, Take 1 capsule (75 mg total) by mouth at bedtime as needed., Disp: 90 capsule, Rfl: 2   sildenafil  (REVATIO ) 20 MG tablet, Take 3 to 5 tablets two hours before intercouse on an empty stomach.  Do not take with nitrates. (Patient not taking: Reported on 02/28/2024), Disp: 50 tablet, Rfl: 3   tamsulosin  (FLOMAX ) 0.4 MG CAPS capsule, TAKE 1 CAPSULE EVERY DAY AFTER SUPPER, Disp: 90 capsule, Rfl: 3   tamsulosin  (FLOMAX ) 0.4 MG CAPS capsule, Take 1 capsule (0.4 mg total) by mouth daily after supper., Disp: 90 capsule, Rfl: 3  Current Facility-Administered Medications:    leuprolide  (6 Month) (ELIGARD ) injection 45 mg, 45 mg, Subcutaneous, Once, Stoioff, Scott C, MD  Social History: Social History   Tobacco Use   Smoking status: Former    Current packs/day: 0.00    Average  packs/day: 1.5 packs/day for 45.0 years (67.5 ttl pk-yrs)    Types: Cigarettes    Start date: 20    Quit date: 2014    Years since quitting: 11.8   Smokeless tobacco: Never  Vaping Use   Vaping status: Never Used  Substance Use Topics   Alcohol use: No   Drug use: No    Family Medical History: Family History  Problem Relation Age of Onset   Heart disease Mother     Physical Examination: There were no vitals filed for this visit.  General: Patient is in no apparent distress. Attention to examination is appropriate.  Neck:   Supple.  Full range of motion.  Respiratory: Patient is breathing without any difficulty.   NEUROLOGICAL:     Awake, alert, oriented to person, place, and time.  Speech is clear and fluent.   Cranial Nerves: Pupils equal round and reactive to light.  Facial tone is symmetric.  Facial sensation is symmetric. Shoulder shrug is symmetric. Tongue protrusion is midline.    Strength: Side Biceps Triceps Deltoid Interossei Grip Wrist Ext. Wrist Flex.  R 5 5 5 5 5 5 5   L 5 5 5 5 5 5 5    Side Iliopsoas Quads Hamstring PF DF EHL  R 5 5 5 5 5 5   L 5 5 5 5 5 5    Reflexes are ***2+ and symmetric at the biceps, triceps, brachioradialis, patella and achilles.   Hoffman's is absent. Clonus is absent  Bilateral upper and lower extremity sensation is intact to light touch ***.     No evidence of dysmetria noted.  Gait is normal.    Imaging: *** I have personally reviewed the images and agree with the above interpretation.  Medical Decision Making/Assessment and Plan: [name] is a pleasant 73 y.o. male with ***  There are no diagnoses linked to this encounter.   Thank you for involving me in the care of this patient.    Penne MICAEL Sharps MD/MSCR Neurosurgery

## 2024-03-02 ENCOUNTER — Other Ambulatory Visit: Payer: Self-pay | Admitting: Radiology

## 2024-03-02 DIAGNOSIS — S32050A Wedge compression fracture of fifth lumbar vertebra, initial encounter for closed fracture: Secondary | ICD-10-CM

## 2024-03-02 NOTE — H&P (Shared)
 Chief Complaint: Patient was seen in consultation today for symptomatic L5 vertebral fracture s/p failed conservative management, with consideration for L5 kyphoplasty.   Referring Provider(s): Mr. Lamar Manus, [place]   Supervising Physician: Hughes Simmonds  Patient Status: [facility] - Out-pt  Patient is Full Code  History of Present Illness: Gary [name] is a 73 y.o. male  with PMHx notable for acute and symptomatic L5 compression fracture, HTN, HLD, mitral regurgitation, COPD, CAD, emphysema, OSA, prostate cancer, osteoarthritis, rheumatoid arthritis, degenerative disc disease, spinal stenosis, spondylosis, bilateral lower extremity venous insufficiency, and others as delineated below.  Per Dr. Thelda consult note on 02/28/2024: 73 y/o M w PMHx significant for lumbar DJD and stenosis w acute, atraumatic L5 vertebral body fractures on CT  AP (02/15/24).   Ongoing, life-style limiting pain secondary to the fractures, refractory to conservative management.   Pain on exam concordant with level of fracture, Failure of conservative therapy and pain refractory to pain mediation, and significant disability on the [employer] Disability Questionnaire with 22/24 positive symptoms, reflecting significant impact/impairment of (ADLs)   [...] He is interested in pursuing a minimally-invasive option for the treatment of his lumbar fracture at this time, and is curious about L5 VERTEBRAL AUGMENTATION with KYPHOPLASTY.    The procedure has been fully reviewed with the patient/patient's authorized representative. The risks, benefits and alternatives have been explained, and the patient/patient's authorized representative has consented to the procedure.  *CT AP (02/15/24) reviewed. MR L spine WO ordered, to be performed ASAP. *Proceed to schedule based on mutual availability. Soonest available per Pt request given significant discomfort.   *Pt based in [place] and with limited mobility and  transport. To be performed at [facility] or [facility], earliest available.  *[employer] KP representative support requested. *Same day procedure, no overnight admission. *Ancef for pre op Abx and ASA hold, 3 days. *Bone density testing w DEXA, Vit D and Ca2+ supplementation per PCP  Patient is scheduled for L5 vertebral augmentation with kyphoplasty in IR today.   ***Patient is alert and laying in bed, calm.  Patient is currently without any significant complaints.  Patient denies any fevers, headache, chest pain, SOB, cough, abdominal pain, nausea, vomiting or bleeding.     Past Medical History:  Diagnosis Date   Anxiety    Asthma    Benign essential HTN 06/24/2015   Last Assessment & Plan:  Taking medications without noted side effects or dizziness.   Overview:  Last Assessment & Plan:  Is compliant with hypertensive medications without clear side effects or lack of control.     BPH (benign prostatic hyperplasia)    COPD (chronic obstructive pulmonary disease) (HCC)    Coronary artery disease 02/16/2014   Overview:  Minimal disease by cath 12/13  Last Assessment & Plan:  Seems to be tolerating medical regimen without significant side effects and symptoms such as worsening chest pain are not noted.   Overview:  Overview:  Minimal disease by cath 12/13  Last Assessment & Plan:  Seems to be tolerating medical regimen without significant side effects and symptoms such as worsening chest pain are not no   DDD (degenerative disc disease), cervical    Degenerative disc disease, cervical 01/22/2017   Depression    DJD (degenerative joint disease)    Emphysema of lung (HCC)    Heel pain 10/10/2017   Hyperglobulinemia    Lumbar degenerative disc disease 01/22/2017   Major depression in remission 03/10/2014   Last Assessment & Plan:  Mood is doing well on meds Overview:  Last Assessment & Plan:  Mood is doing well on meds    Migraines    Mitral regurgitation    Mixed hyperlipidemia 02/16/2014   Last  Assessment & Plan:  Diet for healthy cholesterol is being attempted and no clear myalgia's or other side effects are noted.   Overview:  Last Assessment & Plan:  Low fat diet is being attempted and no significant side effects such as myalgia's are noted.     Neck pain 01/24/2017   OSA (obstructive sleep apnea) 07/05/2016   Last Assessment & Plan:  Continues to use cpap consistently and is continuing to benefit from it's use.     Osteoarthritis of knee 08/12/2014   Overview:  Right knee is worse now  Last Assessment & Plan:  Diffuse pain on back in hips and knees also. Went to a pain clinic  Overview:  Overview:  Right knee is worse now  Last Assessment & Plan:  Diffuse pain on back in hips and knees also. Went to a pain clinic    Prostate cancer (HCC) 2019   Rad Tx's.    RA (rheumatoid arthritis) (HCC)    Spinal stenosis of lumbar region with neurogenic claudication 04/18/2016   Spondylosis without myelopathy or radiculopathy, lumbar region 01/22/2017   Venous insufficiency of both lower extremities 11/07/2016    Past Surgical History:  Procedure Laterality Date   CARDIAC CATHETERIZATION     CHOLECYSTECTOMY     COLONOSCOPY     COLONOSCOPY WITH PROPOFOL  N/A 01/09/2018   Procedure: COLONOSCOPY WITH PROPOFOL ;  Surgeon: Toledo, Ladell POUR, MD;  Location: [facility] ENDOSCOPY;  Service: Gastroenterology;  Laterality: N/A;   deviated septum repair     ESOPHAGOGASTRODUODENOSCOPY (EGD) WITH PROPOFOL  N/A 01/09/2018   Procedure: ESOPHAGOGASTRODUODENOSCOPY (EGD) WITH PROPOFOL ;  Surgeon: Toledo, Ladell POUR, MD;  Location: [facility] ENDOSCOPY;  Service: Gastroenterology;  Laterality: N/A;   IR RADIOLOGIST EVAL & MGMT  02/28/2024   KNEE SURGERY Left    NASAL SEPTUM SURGERY      Allergies: Niacin-lovastatin er, Atorvastatin, Simvastatin, and Propoxyphene  Medications: Prior to Admission medications   Medication Sig Start Date End Date Taking? Authorizing Provider  acetaminophen  (TYLENOL ) 500 MG tablet Take as needed  by mouth.     [provider]  Albuterol Sulfate 108 (90 Base) MCG/ACT AEPB Inhale as needed into the lungs.     [provider]  aspirin EC 81 MG tablet Take 1 tablet by mouth daily. 12/27/22   [provider]  cholecalciferol (VITAMIN D) 1000 units tablet Take by mouth.    [provider]  clobetasol ointment (TEMOVATE) 0.05 % Apply 1 application topically 2 (two) times daily.    [provider]  clotrimazole-betamethasone  (LOTRISONE) cream Apply 1 application topically 2 (two) times daily.    [provider]  diclofenac sodium (VOLTAREN) 1 % GEL Apply topically 4 (four) times daily.    [provider]  DULoxetine  (CYMBALTA ) 60 MG capsule Take 1 capsule (60 mg total) by mouth daily. 06/05/23 05/30/24  Marcelino Nurse, MD  esomeprazole (NEXIUM) 40 MG capsule Take 40 mg by mouth daily. 08/30/18   [provider]  ezetimibe (ZETIA) 10 MG tablet Take 10 mg by mouth daily.  08/21/17   [provider]  fenofibrate (TRICOR) 48 MG tablet Take 48 mg by mouth daily.    [provider]  finasteride (PROSCAR) 5 MG tablet Take 1 tablet by mouth daily. 08/21/23   [provider]  fluticasone-salmeterol (ADVAIR) 100-50 MCG/ACT AEPB Inhale into the lungs. 05/31/21 02/04/24  [provider]  hydrochlorothiazide (HYDRODIURIL) 12.5 MG tablet Take 12.5 mg by mouth daily.    [provider]  Multiple Vitamin (MULTIVITAMIN) tablet Take 1 tablet by mouth daily.    [provider]  naproxen sodium (ALEVE) 220 MG tablet Take 220 mg by mouth 2 (two) times daily as needed.    [provider]  NEXLIZET 180-10 MG TABS Take 1 tablet by mouth daily. 08/18/23   [provider]  oxyCODONE -acetaminophen  (PERCOCET) 10-325 MG tablet Take 1 tablet by mouth every 8 (eight) hours as needed for pain. Must last 30 days. 03/01/24 03/31/24  Patel, Seema K, NP  oxyCODONE -acetaminophen  (PERCOCET) 10-325 MG  tablet Take 1 tablet by mouth every 8 (eight) hours as needed for pain. Must last 30 days. 03/31/24 04/30/24  Patel, Seema K, NP  oxyCODONE -acetaminophen  (PERCOCET) 10-325 MG tablet Take 1 tablet by mouth every 8 (eight) hours as needed for pain. Must last 30 days. 04/30/24 05/30/24  Patel, Seema K, NP  potassium chloride SA (KLOR-CON M) 20 MEQ tablet Take 20 mEq by mouth daily. 03/27/22   [provider]  pregabalin  (LYRICA ) 75 MG capsule Take 1 capsule (75 mg total) by mouth at bedtime as needed. 02/04/24 10/31/24  Patel, Seema K, NP  sildenafil  (REVATIO ) 20 MG tablet Take 3 to 5 tablets two hours before intercouse on an empty stomach.  Do not take with nitrates. Patient not taking: Reported on 02/28/2024 10/10/17   Helon Kirsch A, PA-C  tamsulosin  (FLOMAX ) 0.4 MG CAPS capsule TAKE 1 CAPSULE EVERY DAY AFTER SUPPER 06/23/22   Lenn Aran, MD  tamsulosin  (FLOMAX ) 0.4 MG CAPS capsule Take 1 capsule (0.4 mg total) by mouth daily after supper. 09/05/23   Lenn Aran, MD     Family History  Problem Relation Age of Onset   Heart disease Mother     Social History   Socioeconomic History   Marital status: Married    Spouse name: Not on file   Number of children: Not on file   Years of education: Not on file   Highest education level: Not on file  Occupational History   Not on file  Tobacco Use   Smoking status: Former    Current packs/day: 0.00    Average packs/day: 1.5 packs/day for 45.0 years (67.5 ttl pk-yrs)    Types: Cigarettes    Start date: 74    Quit date: 2014    Years since quitting: 11.9   Smokeless tobacco: Never  Vaping Use   Vaping status: Never Used  Substance and Sexual Activity   Alcohol use: No   Drug use: No   Sexual activity: Not on file  Other Topics Concern   Not on file  Social History Narrative   Not on file   Social Drivers of Health   Financial Resource Strain: Not on file  Food Insecurity: Not on file  Transportation Needs: Not on  file  Physical Activity: Not on file  Stress: Not on file  Social Connections: Not on file     Review of Systems: A 12 point ROS discussed and pertinent positives are indicated in the HPI above.  All other systems are negative.  Vital Signs: There were no vitals taken for this visit.  Advance Care Plan: The advanced care place/surrogate decision maker was discussed at the time of visit and the patient did not wish to discuss or was not able  to name a surrogate decision maker or provide an advance care plan.  Physical Exam  Imaging: MR LUMBAR SPINE WO CONTRAST Result Date: 02/28/2024 EXAM: MRI LUMBAR SPINE 02/28/2024 04:01:17 PM TECHNIQUE: Multiplanar multisequence MRI of the lumbar spine was performed without the administration of intravenous contrast. COMPARISON: MRI lumbar spine 12/28/2022. CT abdomen and pelvis 02/15/2024. CLINICAL HISTORY: Low back pain radiating down the left leg. FINDINGS: BONES AND ALIGNMENT: 5 lumbar type vertebrae. Grade 1 retrolisthesis of L1 on L2 and L2 on L3 and grade 1 anterolisthesis of L4 on L5. Lumbar levoscoliosis. L5 compression fracture with 45% vertebral body height loss, progressed from the recent prior CT. Marrow edema throughout the L5 vertebral body extending into the right sided posterior elements. Mild marrow edema along the S1 superior endplate without a visible discrete fracture. Likely degenerative right sided facet edema at L4-L5 and L5-S1. No suspicious marrow lesion. Modic type 2 endplate changes from T12 to L3. Diffusely increased bone marrow T1 signal intensity throughout the included pelvis likely related to history of radiation therapy for prostate cancer. SPINAL CORD: The conus medullaris terminates at L1-L2 and is normal in signal. SOFT TISSUES: No paraspinal mass. Disc Levels: Disc desiccation and disc space narrowing throughout the lumbar spine including severe narrowing at L1-L2 and L2-L3. T11-T12: Mild disc bulging and moderate facet  hypertrophy without significant stenosis, unchanged. T12-L1: Mild disc bulging and mild to moderate facet hypertrophy without stenosis, unchanged. L1-L2: Circumferential disc bulging, prominent epidural fat, and severe facet and ligamentum flavum hypertrophy result in moderate spinal stenosis, mild right and moderate left lateral recess stenosis, and moderate right and mild left neural foraminal stenosis, unchanged. L2-L3: Circumferential disc bulging, a left paracentral disc osteophyte complex, and severe facet and ligamentum flavum hypertrophy result in moderate spinal stenosis, moderate right and severe left lateral recess stenosis, and moderate to severe right and mild to moderate left neural foraminal stenosis, unchanged. L3-L4: Circumferential disc bulging, a right subarticular to right extraforaminal disc protrusion, and severe facet and ligamentum flavum hypertrophy result in moderate to severe spinal stenosis, severe right and mild to moderate left lateral recess stenosis, and severe right and mild left neural foraminal stenosis, unchanged. L4-L5: Anterolisthesis with bulging of uncovered disc eccentric to the left and severe facet and ligamentum flavum hypertrophy result in severe spinal stenosis and mild right and moderate left neural foraminal stenosis, unchanged. L5-S1: Disc bulging, a new left foraminal to extraforaminal disc protrusion, and severe facet and ligamentum flavum hypertrophy result in mild spinal stenosis, mild right and moderate left lateral recess stenosis, and mild right and severe left neural foraminal stenosis, progressed from prior and with potential left L5 nerve root impingement. IMPRESSION: 1. Acute/subacute L5 compression fracture with progressive 45% vertebral body height loss. 2. Severe left neural foraminal stenosis at L5-S1 due to a new disc protrusion. 3. Unchanged multilevel spinal stenosis, most severe at L4-L5. Electronically signed by: Dasie Hamburg MD 02/28/2024 04:26 PM  EST RP Workstation: [id number]   IR Radiologist Eval & Mgmt Result Date: 02/28/2024 EXAM: NEW PATIENT OFFICE VISIT CHIEF COMPLAINT: See below HISTORY OF PRESENT ILLNESS: See below REVIEW OF SYSTEMS: See below PHYSICAL EXAMINATION: See below ASSESSMENT AND PLAN: Please refer to completed note in the electronic medical record on [employer] Epic Thom Hall, MD [facility] Electronically Signed   By: Thom Hall M.D.   On: 02/28/2024 14:39   CT ABDOMEN PELVIS W CONTRAST Result Date: 02/15/2024 EXAM: CT ABDOMEN AND PELVIS WITH CONTRAST 02/15/2024  03:02:57 PM TECHNIQUE: CT of the abdomen and pelvis was performed with the administration of 100 mL of iohexol  (OMNIPAQUE ) 300 MG/ML solution. Multiplanar reformatted images are provided for review. Automated exposure control, iterative reconstruction, and/or weight-based adjustment of the mA/kV was utilized to reduce the radiation dose to as low as reasonably achievable. COMPARISON: Lumbar spine MRI 09/27/2022. CLINICAL HISTORY: Bruising in the right abdomen and left hip. FINDINGS: LOWER CHEST: No acute abnormality. LIVER: There is fatty infiltration of the liver. GALLBLADDER AND BILE DUCTS: Gallbladder is surgically absent. Pneumobilia is present. No biliary ductal dilatation. SPLEEN: No acute abnormality. PANCREAS: No acute abnormality. ADRENAL GLANDS: No acute abnormality. KIDNEYS, URETERS AND BLADDER: No stones in the kidneys or ureters. No hydronephrosis. No perinephric or periureteral stranding. Urinary bladder is unremarkable. GI AND BOWEL: The stomach is decompressed. There is a large amount of stool throughout the colon. The appendix appears normal. There is no bowel obstruction. PERITONEUM AND RETROPERITONEUM: No ascites. No free air. VASCULATURE: Aorta is normal in caliber. LYMPH NODES: No lymphadenopathy. REPRODUCTIVE ORGANS: No acute abnormality. BONES AND SOFT TISSUES: Multilevel degenerative  changes of the thoracic spine. There is acute/subacute fracture of the inferior endplate of L5 with 10% vertebral body height loss. There are small fat-containing bilateral inguinal hernias. There is also a small fat-containing umbilical hernia. No focal body wall hematoma identified. IMPRESSION: 1. Acute/subacute fracture of the inferior endplate of L5 with approximately 10% vertebral body height loss. 2. Status post cholecystectomy with pneumobilia, correlate with biliary instrumentation. 3. Hepatic steatosis. Electronically signed by: Greig Pique MD 02/15/2024 03:34 PM EST RP Workstation: [id number]    Labs:  CBC: No results for input(s): WBC, HGB, HCT, PLT in the last 8760 hours.  COAGS: No results for input(s): INR, APTT in the last 8760 hours.  BMP: Recent Labs    02/15/24 1444  CREATININE 1.00    LIVER FUNCTION TESTS: No results for input(s): BILITOT, AST, ALT, ALKPHOS, PROT, ALBUMIN in the last 8760 hours.  TUMOR MARKERS: No results for input(s): AFPTM, CEA, CA199, CHROMGRNA in the last 8760 hours.  Assessment and Plan: Per Dr. Thelda consult note on 02/28/2024: [Patient] is interested in pursuing a minimally-invasive option for the treatment of his lumbar fracture at this time, and is curious about L5 VERTEBRAL AUGMENTATION with KYPHOPLASTY.    The procedure has been fully reviewed with the patient/patient's authorized representative. The risks, benefits and alternatives have been explained, and the patient/patient's authorized representative has consented to the procedure.  *CT AP (02/15/24) reviewed. MR L spine WO ordered, to be performed ASAP. *Proceed to schedule based on mutual availability. Soonest available per Pt request given significant discomfort.   *Pt based in [place] and with limited mobility and transport. To be performed at [facility] or [facility], earliest available.  *[employer] KP representative support requested. *Same day  procedure, no overnight admission. *Ancef for pre op Abx and ASA hold, 3 days. *Bone density testing w DEXA, Vit D and Ca2+ supplementation per PCP  Patient presents for scheduled L5 kyphoplasty in IR today.  ***Patient has been NPO since midnight.  All labs and medications are within acceptable parameters.  No pertinent allergies.   Risks and benefits of L5 kyphoplasty were discussed with the patient including, but not limited to education regarding the natural healing process of compression fractures without intervention, bleeding, infection, cement migration which may cause spinal cord damage, paralysis, pulmonary embolism or even death.  This interventional procedure involves the use of X-rays and because of the nature of  the planned procedure, it is possible that we will have prolonged use of X-ray fluoroscopy.  Potential radiation risks to you include (but are not limited to) the following: - A slightly elevated risk for cancer several years later in life. This risk is typically less than 0.5% percent. This risk is low in comparison to the normal incidence of human cancer, which is 33% for women and 50% for men according to the American Cancer Society. - Radiation induced injury can include skin redness, resembling a rash, tissue breakdown / ulcers and hair loss (which can be temporary or permanent).   The likelihood of either of these occurring depends on the difficulty of the procedure and whether you are sensitive to radiation due to previous procedures, disease, or genetic conditions.   IF your procedure requires a prolonged use of radiation, you will be notified and given written instructions for further action.  It is your responsibility to monitor the irradiated area for the 2 weeks following the procedure and to notify your physician if you are concerned that you have suffered a radiation induced injury.    All of the patient's questions were answered, patient is agreeable to  proceed.  Consent signed and in chart.    Thank you for allowing our service to participate in Gary [name] 's care.  Electronically Signed: Carlin DELENA Griffon, PA-C   03/02/2024, 7:44 PM      I spent a total of {Established Out-Pt:[id number]} in face to face in clinical consultation, greater than 50% of which was counseling/coordinating care for symptomatic L5 vertebral fracture s/p failed conservative management, with consideration for L5 kyphoplasty.

## 2024-03-03 ENCOUNTER — Ambulatory Visit
Admission: RE | Admit: 2024-03-03 | Discharge: 2024-03-03 | Disposition: A | Discharge status: Home / Self Care | Source: Ambulatory Visit | Attending: Interventional Radiology | Admitting: Interventional Radiology

## 2024-03-10 ENCOUNTER — Ambulatory Visit: Admitting: Neurosurgery

## 2024-03-10 ENCOUNTER — Other Ambulatory Visit: Payer: Self-pay | Admitting: Radiology

## 2024-03-11 ENCOUNTER — Encounter: Payer: Self-pay | Admitting: Radiology

## 2024-03-11 ENCOUNTER — Other Ambulatory Visit: Payer: Self-pay

## 2024-03-11 ENCOUNTER — Ambulatory Visit
Admission: RE | Admit: 2024-03-11 | Discharge: 2024-03-11 | Disposition: A | Discharge status: Home / Self Care | Source: Ambulatory Visit | Attending: Interventional Radiology | Admitting: Interventional Radiology

## 2024-03-11 DIAGNOSIS — M5416 Radiculopathy, lumbar region: Secondary | ICD-10-CM | POA: Insufficient documentation

## 2024-03-11 DIAGNOSIS — M48061 Spinal stenosis, lumbar region without neurogenic claudication: Secondary | ICD-10-CM | POA: Insufficient documentation

## 2024-03-11 DIAGNOSIS — Z7982 Long term (current) use of aspirin: Secondary | ICD-10-CM | POA: Insufficient documentation

## 2024-03-11 DIAGNOSIS — S32050A Wedge compression fracture of fifth lumbar vertebra, initial encounter for closed fracture: Secondary | ICD-10-CM

## 2024-03-11 DIAGNOSIS — M4856XA Collapsed vertebra, not elsewhere classified, lumbar region, initial encounter for fracture: Secondary | ICD-10-CM | POA: Insufficient documentation

## 2024-03-11 HISTORY — PX: IR KYPHO LUMBAR INC FX REDUCE BONE BX UNI/BIL CANNULATION INC/IMAGING: IMG5519

## 2024-03-11 LAB — CBC WITH DIFFERENTIAL/PLATELET
Abs Immature Granulocytes: 0.01 K/uL (ref 0.00–0.07)
Basophils Absolute: 0.1 K/uL (ref 0.0–0.1)
Basophils Relative: 1 %
Eosinophils Absolute: 0.5 K/uL (ref 0.0–0.5)
Eosinophils Relative: 9 %
HCT: 38.3 % — ABNORMAL LOW (ref 39.0–52.0)
Hemoglobin: 12.7 g/dL — ABNORMAL LOW (ref 13.0–17.0)
Immature Granulocytes: 0 %
Lymphocytes Relative: 25 %
Lymphs Abs: 1.5 K/uL (ref 0.7–4.0)
MCH: 33 pg (ref 26.0–34.0)
MCHC: 33.2 g/dL (ref 30.0–36.0)
MCV: 99.5 fL (ref 80.0–100.0)
Monocytes Absolute: 0.7 K/uL (ref 0.1–1.0)
Monocytes Relative: 11 %
Neutro Abs: 3.4 K/uL (ref 1.7–7.7)
Neutrophils Relative %: 54 %
Platelets: 283 K/uL (ref 150–400)
RBC: 3.85 MIL/uL — ABNORMAL LOW (ref 4.22–5.81)
RDW: 12.7 % (ref 11.5–15.5)
WBC: 6.2 K/uL (ref 4.0–10.5)
nRBC: 0 % (ref 0.0–0.2)

## 2024-03-11 LAB — GLUCOSE, CAPILLARY: Glucose-Capillary: 109 mg/dL — ABNORMAL HIGH (ref 70–99)

## 2024-03-11 LAB — BASIC METABOLIC PANEL WITH GFR
Anion gap: 11 (ref 5–15)
BUN: 29 mg/dL — ABNORMAL HIGH (ref 8–23)
CO2: 25 mmol/L (ref 22–32)
Calcium: 9.7 mg/dL (ref 8.9–10.3)
Chloride: 105 mmol/L (ref 98–111)
Creatinine, Ser: 0.83 mg/dL (ref 0.61–1.24)
GFR, Estimated: >60 mL/min (ref >60)
Glucose, Bld: 157 mg/dL — ABNORMAL HIGH (ref 70–99)
Potassium: 4 mmol/L (ref 3.5–5.1)
Sodium: 141 mmol/L (ref 135–145)

## 2024-03-11 LAB — PROTIME-INR
INR: 1 (ref 0.8–1.2)
Prothrombin Time: 13.9 s (ref 11.4–15.2)

## 2024-03-11 MED ORDER — SODIUM CHLORIDE 0.9 % IV SOLN: INTRAVENOUS | Status: DC

## 2024-03-11 MED ORDER — MIDAZOLAM HCL 2 MG/2ML IJ SOLN
INTRAMUSCULAR | Status: AC
  Filled 2024-03-11: qty 2

## 2024-03-11 MED ORDER — LIDOCAINE HCL 1 % IJ SOLN
INTRAMUSCULAR | Status: AC
  Filled 2024-03-11: qty 20

## 2024-03-11 MED ORDER — BUPIVACAINE HCL (PF) 0.5 % IJ SOLN
30.0000 mL | Freq: Once | INTRAMUSCULAR | Status: AC
  Administered 2024-03-11: 20 mL

## 2024-03-11 MED ORDER — BUPIVACAINE IN DEXTROSE 0.75-8.25 % IT SOLN
2.0000 mL | Freq: Once | INTRATHECAL | Status: DC
  Filled 2024-03-11: qty 2

## 2024-03-11 MED ORDER — LIDOCAINE HCL 1 % IJ SOLN
20.0000 mL | Freq: Once | INTRAMUSCULAR | Status: AC
  Administered 2024-03-11: 20 mL via INTRADERMAL

## 2024-03-11 MED ORDER — MIDAZOLAM HCL (PF) 2 MG/2ML IJ SOLN
INTRAMUSCULAR | Status: AC | PRN
  Administered 2024-03-11: .5 mg via INTRAVENOUS
  Administered 2024-03-11: 1 mg via INTRAVENOUS
  Administered 2024-03-11 (×3): .5 mg via INTRAVENOUS

## 2024-03-11 MED ORDER — IOHEXOL 300 MG/ML  SOLN
10.0000 mL | Freq: Once | INTRAMUSCULAR | Status: AC | PRN
  Administered 2024-03-11: 15 mL

## 2024-03-11 MED ORDER — FENTANYL CITRATE (PF) 100 MCG/2ML IJ SOLN
INTRAMUSCULAR | Status: AC
  Filled 2024-03-11: qty 2

## 2024-03-11 MED ORDER — BUPIVACAINE HCL (PF) 0.5 % IJ SOLN
INTRAMUSCULAR | Status: AC
  Filled 2024-03-11: qty 30

## 2024-03-11 MED ORDER — FENTANYL CITRATE (PF) 100 MCG/2ML IJ SOLN
INTRAMUSCULAR | Status: AC | PRN
  Administered 2024-03-11 (×3): 25 ug via INTRAVENOUS
  Administered 2024-03-11 (×2): 50 ug via INTRAVENOUS
  Administered 2024-03-11: 25 ug via INTRAVENOUS

## 2024-03-11 MED ORDER — CEFAZOLIN SODIUM-DEXTROSE 2-4 GM/100ML-% IV SOLN
2.0000 g | INTRAVENOUS | Status: AC
  Administered 2024-03-11: 2 g via INTRAVENOUS

## 2024-03-11 MED ORDER — OXYCODONE HCL 5 MG PO TABS
10.0000 mg | ORAL_TABLET | ORAL | Status: DC | PRN
  Filled 2024-03-11: qty 2

## 2024-03-11 MED ORDER — CEFAZOLIN SODIUM-DEXTROSE 2-4 GM/100ML-% IV SOLN
INTRAVENOUS | Status: AC
  Filled 2024-03-11: qty 100

## 2024-03-11 MED ORDER — MIDAZOLAM HCL 5 MG/5ML IJ SOLN
INTRAMUSCULAR | Status: AC
  Filled 2024-03-11: qty 5

## 2024-03-11 NOTE — Procedures (Signed)
 Vascular and Interventional Radiology Procedure Note  Patient: Gary [name] DOB: 1950-06-25 Medical Record Number: [id number] Note Date/Time: 03/11/24 2:46 PM   Performing Physician: Thom Hall, MD Assistant(s): None  Diagnosis: Symptomatic L5 vertebral body fracture.  Procedure: L5 VERTEBRAL BODY KYPHOPLASTY  Anesthesia: Conscious Sedation Complications: None Estimated Blood Loss: Minimal Specimens: Sent for Pathology  Findings:  Successful Fluoroscopy-guided L5 vertebral body, bipedicular Kyphoplasty. A total of 9 mL PMMA was used. Hemostasis of the tract was achieved using Manual Pressure.  Plan: Bed rest for 2 hours.  See detailed procedure note with images in PACS. The patient tolerated the procedure well without incident or complication and was returned to Recovery in stable condition.    Thom Hall, MD [facility]   Pager. [phone] Clinic. [phone]

## 2024-03-11 NOTE — Discharge Instructions (Signed)
Kyphoplasty, Care After This sheet gives you information about how to care for yourself after your procedure. Your health care provider may also give you more specific instructions. If you have problems or questions, contact your health care provider. What can I expect after the procedure? After the procedure, it is common to have back pain. Follow these instructions at home: Medicines and Diet Take over-the-counter and prescription medicines only as told by your health care provider. Take over-the-counter or prescription medicines that you normally take.  However, if you are taking Aspirin or an anticoagulant/blood thinner you will be told when you can resume taking these by the healthcare provider.  You may resume a regular diet Eat foods that are high in fiber, such as beans, whole grains, and fresh fruits and vegetables. Limit foods that are high in fat and processed sugars, such as fried or sweet foods. Puncture site care   You may remove bandaid tomorrow after taking a shower.  Replace daily with a clean bandaid until healed. Check your puncture site every day for signs of infection. Check for: Redness, swelling, or pain. Fluid or blood. Warmth. Pus or a bad smell.   Managing pain, stiffness, and swelling If directed, put ice on the painful area. To do this: You may use an ice pack as needed to the injection sites on your back.  Ice to the site 20 minutes on and 20 minutes off, as needed. Place a towel between your skin and the bag.   Activity No driving or operating machinery for 24 hours after your procedure. Do not lift anything that is heavier than a milk jug for 1 to 2 weeks or determined by your physician..  Follow up with your physician in 2 weeks.  Contact a health care provider if: You have a fever greater than 100 degrees You have redness, swelling, or pain at the site of your puncture. You have fluid, blood, or pus coming from the puncture site. You have pain that  gets worse or does not get better with medicine. You develop numbness or weakness in any part of your body. Get help right away if: You have chest pain. You have difficulty breathing. You have weakness, numbness, or tingling in your legs. You cannot control your bladder or bowel movements (incontinence). You suddenly become weak or numb on one side of your body. You become very confused. You have trouble speaking or understanding, or both. These symptoms may represent a serious problem that is an emergency. Do not wait to see if the symptoms will go away. Get medical help right away. Call your local emergency services (911 in the [place] Do not drive yourself to the hospital. Summary Follow instructions from your health care provider about how to take care of your puncture site. Take over-the-counter and prescription medicines only as told by your health care provider. Rest your back and avoid intense physical activity for as long as told by your health care provider. Contact a health care provider if you have pain that gets worse or does not get better with medicine. Keep all follow-up visits. This is important. This information is not intended to replace advice given to you by your health care provider. Make sure you discuss any questions you have with your health care provider. Document Revised: 07/16/2019 Document Reviewed: 07/16/2019 Elsevier Patient Education  2022 [employer]       

## 2024-03-11 NOTE — Progress Notes (Addendum)
 Brittney Hunn[place]ut,PA with Dr. Hughes in at bedside to see pt.: PA speaking with pt. And his wife re: results of kyphoplasty. Pt. And wife verbalized understanding of conversation with PA

## 2024-03-11 NOTE — H&P (Signed)
 Chief Complaint: L5 compression fracture  Referring Provider(s): Mugweru,Jon   Supervising Physician: Hughes Simmonds  Patient Status: [facility] - Out-pt  History of Present Illness: Gary [name] is a 73 y.o. male with a history of cervical and lumbar degenerative disc disease, arthritis, lumbar stenosis and spondylosis, and lumbar facet arthropathy who was recently evaluated by his PCP for increasing lower back pain with radiation down his left leg. He is a patient of a pain clinic where he has received multiple injections in an attempt to relieve pain with minimal relief: SI joint injections, lumbar epidural injections, and a lumbar facet nerve block most recently as of 08/2023. In October, he began experiencing back pain radiating down left leg and affecting his ADLs; pain was not precipitated by fall, injury, or accident. CT AP (02/15/24) demonstrated an acute/subacute fracture of the inferior endplate of L5 with approximately 10% vertebral body height loss.   His pain has been unrelieved by conservative management with oral pain medications. He was seen in clinic by Dr. Hughes and found to be a candidate for L5 KP/VP. A biopsy is also recommended based on no precipitating event.    Confirms NPO since MN and ride/supervision available for 24 hours.  Does not use supplemental home O2. He does use a CPAP nightly.  Denies fever, chills, SOB, CP or palpitations, sore throat, N/V, abd pain, blood in stool or urine,  leg swelling. He endorses back pain and left leg radiation of pain and tingling which continues to impact his QoL. He experienced increased brusing with ASA recently but bruising has improved since stopping ASA, LD >5 days ago.   Allergies Reviewed:  Niacin-lovastatin er, Atorvastatin, Simvastatin, and Propoxyphene   Patient is Full Code  Past Medical History:  Diagnosis Date   Anxiety    Asthma    Benign essential HTN 06/24/2015   Last Assessment & Plan:  Taking  medications without noted side effects or dizziness.   Overview:  Last Assessment & Plan:  Is compliant with hypertensive medications without clear side effects or lack of control.     BPH (benign prostatic hyperplasia)    COPD (chronic obstructive pulmonary disease) (HCC)    Coronary artery disease 02/16/2014   Overview:  Minimal disease by cath 12/13  Last Assessment & Plan:  Seems to be tolerating medical regimen without significant side effects and symptoms such as worsening chest pain are not noted.   Overview:  Overview:  Minimal disease by cath 12/13  Last Assessment & Plan:  Seems to be tolerating medical regimen without significant side effects and symptoms such as worsening chest pain are not no   DDD (degenerative disc disease), cervical    Degenerative disc disease, cervical 01/22/2017   Depression    DJD (degenerative joint disease)    Emphysema of lung (HCC)    Heel pain 10/10/2017   Hyperglobulinemia    Lumbar degenerative disc disease 01/22/2017   Major depression in remission 03/10/2014   Last Assessment & Plan:  Mood is doing well on meds Overview:  Last Assessment & Plan:  Mood is doing well on meds    Migraines    Mitral regurgitation    Mixed hyperlipidemia 02/16/2014   Last Assessment & Plan:  Diet for healthy cholesterol is being attempted and no clear myalgia's or other side effects are noted.   Overview:  Last Assessment & Plan:  Low fat diet is being attempted and no significant side effects such as myalgia's are noted.  Neck pain 01/24/2017   OSA (obstructive sleep apnea) 07/05/2016   Last Assessment & Plan:  Continues to use cpap consistently and is continuing to benefit from it's use.     Osteoarthritis of knee 08/12/2014   Overview:  Right knee is worse now  Last Assessment & Plan:  Diffuse pain on back in hips and knees also. Went to a pain clinic  Overview:  Overview:  Right knee is worse now  Last Assessment & Plan:  Diffuse pain on back in hips and knees also. Went  to a pain clinic    Prostate cancer (HCC) 2019   Rad Tx's.    RA (rheumatoid arthritis) (HCC)    Spinal stenosis of lumbar region with neurogenic claudication 04/18/2016   Spondylosis without myelopathy or radiculopathy, lumbar region 01/22/2017   Venous insufficiency of both lower extremities 11/07/2016    Past Surgical History:  Procedure Laterality Date   CARDIAC CATHETERIZATION     CHOLECYSTECTOMY     COLONOSCOPY     COLONOSCOPY WITH PROPOFOL  N/A 01/09/2018   Procedure: COLONOSCOPY WITH PROPOFOL ;  Surgeon: Toledo, Ladell POUR, MD;  Location: [facility] ENDOSCOPY;  Service: Gastroenterology;  Laterality: N/A;   deviated septum repair     ESOPHAGOGASTRODUODENOSCOPY (EGD) WITH PROPOFOL  N/A 01/09/2018   Procedure: ESOPHAGOGASTRODUODENOSCOPY (EGD) WITH PROPOFOL ;  Surgeon: Toledo, Ladell POUR, MD;  Location: [facility] ENDOSCOPY;  Service: Gastroenterology;  Laterality: N/A;   IR RADIOLOGIST EVAL & MGMT  02/28/2024   KNEE SURGERY Left    NASAL SEPTUM SURGERY        Medications: Prior to Admission medications   Medication Sig Start Date End Date Taking? Authorizing Provider  acetaminophen  (TYLENOL ) 500 MG tablet Take as needed by mouth.    Yes [provider]  aspirin EC 81 MG tablet Take 1 tablet by mouth daily. 12/27/22  Yes [provider]  cholecalciferol (VITAMIN D) 1000 units tablet Take by mouth.   Yes [provider]  DULoxetine  (CYMBALTA ) 60 MG capsule Take 1 capsule (60 mg total) by mouth daily. 06/05/23 05/30/24 Yes Marcelino Nurse, MD  esomeprazole (NEXIUM) 40 MG capsule Take 40 mg by mouth daily. 08/30/18  Yes [provider]  fenofibrate (TRICOR) 48 MG tablet Take 48 mg by mouth daily.   Yes [provider]  finasteride (PROSCAR) 5 MG tablet Take 1 tablet by mouth daily. 08/21/23  Yes [provider]  hydrochlorothiazide (HYDRODIURIL) 12.5 MG tablet Take 12.5 mg by mouth daily.   Yes [provider]  Multiple Vitamin (MULTIVITAMIN)  tablet Take 1 tablet by mouth daily.   Yes [provider]  naproxen sodium (ALEVE) 220 MG tablet Take 220 mg by mouth 2 (two) times daily as needed.   Yes [provider]  NEXLIZET 180-10 MG TABS Take 1 tablet by mouth daily. 08/18/23  Yes [provider]  oxyCODONE -acetaminophen  (PERCOCET) 10-325 MG tablet Take 1 tablet by mouth every 8 (eight) hours as needed for pain. Must last 30 days. 03/01/24 03/31/24 Yes Patel, Seema K, NP  potassium chloride SA (KLOR-CON M) 20 MEQ tablet Take 20 mEq by mouth daily. 03/27/22  Yes [provider]  pregabalin  (LYRICA ) 75 MG capsule Take 1 capsule (75 mg total) by mouth at bedtime as needed. 02/04/24 10/31/24 Yes Patel, Seema K, NP  tamsulosin  (FLOMAX ) 0.4 MG CAPS capsule Take 1 capsule (0.4 mg total) by mouth daily after supper. 09/05/23  Yes Chrystal, Marcey, MD  Albuterol Sulfate 108 (90 Base) MCG/ACT AEPB Inhale as needed into the  lungs.     [provider]  clobetasol ointment (TEMOVATE) 0.05 % Apply 1 application topically 2 (two) times daily.    [provider]  clotrimazole-betamethasone  (LOTRISONE) cream Apply 1 application topically 2 (two) times daily.    [provider]  diclofenac sodium (VOLTAREN) 1 % GEL Apply topically 4 (four) times daily.    [provider]  ezetimibe (ZETIA) 10 MG tablet Take 10 mg by mouth daily.  08/21/17   [provider]  fluticasone-salmeterol (ADVAIR) 100-50 MCG/ACT AEPB Inhale into the lungs. 05/31/21 02/04/24  [provider]  oxyCODONE -acetaminophen  (PERCOCET) 10-325 MG tablet Take 1 tablet by mouth every 8 (eight) hours as needed for pain. Must last 30 days. 03/31/24 04/30/24  Patel, Seema K, NP  oxyCODONE -acetaminophen  (PERCOCET) 10-325 MG tablet Take 1 tablet by mouth every 8 (eight) hours as needed for pain. Must last 30 days. 04/30/24 05/30/24  Patel, Seema K, NP  sildenafil  (REVATIO ) 20 MG tablet Take 3 to 5 tablets two hours before  intercouse on an empty stomach.  Do not take with nitrates. Patient not taking: Reported on 02/28/2024 10/10/17   Helon Kirsch A, PA-C  tamsulosin  (FLOMAX ) 0.4 MG CAPS capsule TAKE 1 CAPSULE EVERY DAY AFTER SUPPER 06/23/22   Lenn Aran, MD     Family History  Problem Relation Age of Onset   Heart disease Mother     Social History   Socioeconomic History   Marital status: Married    Spouse name: Not on file   Number of children: Not on file   Years of education: Not on file   Highest education level: Not on file  Occupational History   Not on file  Tobacco Use   Smoking status: Former    Current packs/day: 0.00    Average packs/day: 1.5 packs/day for 45.0 years (67.5 ttl pk-yrs)    Types: Cigarettes    Start date: 56    Quit date: 2014    Years since quitting: 11.9   Smokeless tobacco: Never  Vaping Use   Vaping status: Never Used  Substance and Sexual Activity   Alcohol use: No   Drug use: No   Sexual activity: Not on file  Other Topics Concern   Not on file  Social History Narrative   Not on file   Social Drivers of Health   Financial Resource Strain: Not on file  Food Insecurity: Not on file  Transportation Needs: Not on file  Physical Activity: Not on file  Stress: Not on file  Social Connections: Not on file     Review of Systems: A 12 point ROS discussed and pertinent positives are indicated in the HPI above.  All other systems are negative.    Vital Signs: BP 127/84 (BP Location: Right Arm)   Pulse 72   Temp 97.6 F (36.4 C) (Temporal)   Resp 16   SpO2 92%   Advance Care Plan: No documents on file   Physical Exam HENT:     Mouth/Throat:     Mouth: Mucous membranes are moist.     Pharynx: Oropharynx is clear.  Cardiovascular:     Rate and Rhythm: Normal rate.     Pulses: Normal pulses.     Comments: Frequent PVCs noted on cardiac monitor Pulmonary:     Effort: Pulmonary effort is normal.     Breath sounds: Normal breath sounds.   Abdominal:     General: There is no distension.     Palpations: Abdomen is  soft.     Tenderness: There is no guarding.     Comments: RLQ tenderness over area of bruising previously assessed by PCP  Musculoskeletal:     Right lower leg: No edema.     Left lower leg: No edema.  Skin:    General: Skin is warm and dry.     Comments: No rash or open wound over planned puncture sites in the lumbar region  Neurological:     Mental Status: He is alert and oriented to person, place, and time.  Psychiatric:        Mood and Affect: Mood normal.        Behavior: Behavior normal.        Thought Content: Thought content normal.        Judgment: Judgment normal.     Imaging: MR LUMBAR SPINE WO CONTRAST Result Date: 02/28/2024 EXAM: MRI LUMBAR SPINE 02/28/2024 04:01:17 PM TECHNIQUE: Multiplanar multisequence MRI of the lumbar spine was performed without the administration of intravenous contrast. COMPARISON: MRI lumbar spine 12/28/2022. CT abdomen and pelvis 02/15/2024. CLINICAL HISTORY: Low back pain radiating down the left leg. FINDINGS: BONES AND ALIGNMENT: 5 lumbar type vertebrae. Grade 1 retrolisthesis of L1 on L2 and L2 on L3 and grade 1 anterolisthesis of L4 on L5. Lumbar levoscoliosis. L5 compression fracture with 45% vertebral body height loss, progressed from the recent prior CT. Marrow edema throughout the L5 vertebral body extending into the right sided posterior elements. Mild marrow edema along the S1 superior endplate without a visible discrete fracture. Likely degenerative right sided facet edema at L4-L5 and L5-S1. No suspicious marrow lesion. Modic type 2 endplate changes from T12 to L3. Diffusely increased bone marrow T1 signal intensity throughout the included pelvis likely related to history of radiation therapy for prostate cancer. SPINAL CORD: The conus medullaris terminates at L1-L2 and is normal in signal. SOFT TISSUES: No paraspinal mass. Disc Levels: Disc desiccation and disc  space narrowing throughout the lumbar spine including severe narrowing at L1-L2 and L2-L3. T11-T12: Mild disc bulging and moderate facet hypertrophy without significant stenosis, unchanged. T12-L1: Mild disc bulging and mild to moderate facet hypertrophy without stenosis, unchanged. L1-L2: Circumferential disc bulging, prominent epidural fat, and severe facet and ligamentum flavum hypertrophy result in moderate spinal stenosis, mild right and moderate left lateral recess stenosis, and moderate right and mild left neural foraminal stenosis, unchanged. L2-L3: Circumferential disc bulging, a left paracentral disc osteophyte complex, and severe facet and ligamentum flavum hypertrophy result in moderate spinal stenosis, moderate right and severe left lateral recess stenosis, and moderate to severe right and mild to moderate left neural foraminal stenosis, unchanged. L3-L4: Circumferential disc bulging, a right subarticular to right extraforaminal disc protrusion, and severe facet and ligamentum flavum hypertrophy result in moderate to severe spinal stenosis, severe right and mild to moderate left lateral recess stenosis, and severe right and mild left neural foraminal stenosis, unchanged. L4-L5: Anterolisthesis with bulging of uncovered disc eccentric to the left and severe facet and ligamentum flavum hypertrophy result in severe spinal stenosis and mild right and moderate left neural foraminal stenosis, unchanged. L5-S1: Disc bulging, a new left foraminal to extraforaminal disc protrusion, and severe facet and ligamentum flavum hypertrophy result in mild spinal stenosis, mild right and moderate left lateral recess stenosis, and mild right and severe left neural foraminal stenosis, progressed from prior and with potential left L5 nerve root impingement. IMPRESSION: 1. Acute/subacute L5 compression fracture with progressive 45% vertebral body height loss. 2. Severe left neural  foraminal stenosis at L5-S1 due to a new disc  protrusion. 3. Unchanged multilevel spinal stenosis, most severe at L4-L5. Electronically signed by: Dasie Hamburg MD 02/28/2024 04:26 PM EST RP Workstation: [id number]   IR Radiologist Eval & Mgmt Result Date: 02/28/2024 EXAM: NEW PATIENT OFFICE VISIT CHIEF COMPLAINT: See below HISTORY OF PRESENT ILLNESS: See below REVIEW OF SYSTEMS: See below PHYSICAL EXAMINATION: See below ASSESSMENT AND PLAN: Please refer to completed note in the electronic medical record on [facility] Thom Hall, MD [facility] Electronically Signed   By: Thom Hall M.D.   On: 02/28/2024 14:39   CT ABDOMEN PELVIS W CONTRAST Result Date: 02/15/2024 EXAM: CT ABDOMEN AND PELVIS WITH CONTRAST 02/15/2024 03:02:57 PM TECHNIQUE: CT of the abdomen and pelvis was performed with the administration of 100 mL of iohexol  (OMNIPAQUE ) 300 MG/ML solution. Multiplanar reformatted images are provided for review. Automated exposure control, iterative reconstruction, and/or weight-based adjustment of the mA/kV was utilized to reduce the radiation dose to as low as reasonably achievable. COMPARISON: Lumbar spine MRI 09/27/2022. CLINICAL HISTORY: Bruising in the right abdomen and left hip. FINDINGS: LOWER CHEST: No acute abnormality. LIVER: There is fatty infiltration of the liver. GALLBLADDER AND BILE DUCTS: Gallbladder is surgically absent. Pneumobilia is present. No biliary ductal dilatation. SPLEEN: No acute abnormality. PANCREAS: No acute abnormality. ADRENAL GLANDS: No acute abnormality. KIDNEYS, URETERS AND BLADDER: No stones in the kidneys or ureters. No hydronephrosis. No perinephric or periureteral stranding. Urinary bladder is unremarkable. GI AND BOWEL: The stomach is decompressed. There is a large amount of stool throughout the colon. The appendix appears normal. There is no bowel obstruction. PERITONEUM AND RETROPERITONEUM: No ascites. No free air. VASCULATURE: Aorta is normal in  caliber. LYMPH NODES: No lymphadenopathy. REPRODUCTIVE ORGANS: No acute abnormality. BONES AND SOFT TISSUES: Multilevel degenerative changes of the thoracic spine. There is acute/subacute fracture of the inferior endplate of L5 with 10% vertebral body height loss. There are small fat-containing bilateral inguinal hernias. There is also a small fat-containing umbilical hernia. No focal body wall hematoma identified. IMPRESSION: 1. Acute/subacute fracture of the inferior endplate of L5 with approximately 10% vertebral body height loss. 2. Status post cholecystectomy with pneumobilia, correlate with biliary instrumentation. 3. Hepatic steatosis. Electronically signed by: Greig Pique MD 02/15/2024 03:34 PM EST RP Workstation: [id number]    Labs:  CBC: No results for input(s): WBC, HGB, HCT, PLT in the last 8760 hours.  COAGS: Recent Labs    03/11/24 1250  INR 1.0    BMP: Recent Labs    02/15/24 1444  CREATININE 1.00    LIVER FUNCTION TESTS: No results for input(s): BILITOT, AST, ALT, ALKPHOS, PROT, ALBUMIN in the last 8760 hours.  TUMOR MARKERS: No results for input(s): AFPTM, CEA, CA199, CHROMGRNA in the last 8760 hours.  Assessment and Plan:  Request for  image guided L5 KP/VP and possible L5 biopsy approved by Dr. Hall for 03/11/2024. No contraindications for procedure identified in ROS, physical exam, or review of pre-sedation considerations. Labs, including same-day CBC and INR, reviewed and within acceptable range 02/28/2024 MR lumbar imaging available and reviewed.  Patient that he did undergo bone density scan in interim of clinic appointment VSS, afebrile Last dose aspirin greater than 5 days ago. Cefazolin ordered    Risks and benefits of image guided kyphoplasty/vertebroplasty and possible L5 biopsy were discussed with the patient including, but not limited to education regarding the natural healing process of compression fractures without  intervention, bleeding, infection,  cement migration which may cause spinal cord damage, paralysis, pulmonary embolism or even death.  This interventional procedure involves the use of X-rays and because of the nature of the planned procedure, it is possible that we will have prolonged use of X-ray fluoroscopy.  Potential radiation risks to you include (but are not limited to) the following: - A slightly elevated risk for cancer several years later in life. This risk is typically less than 0.5% percent. This risk is low in comparison to the normal incidence of human cancer, which is 33% for women and 50% for men according to the [employer] Society. - Radiation induced injury can include skin redness, resembling a rash, tissue breakdown / ulcers and hair loss (which can be temporary or permanent).   The likelihood of either of these occurring depends on the difficulty of the procedure and whether you are sensitive to radiation due to previous procedures, disease, or genetic conditions.   IF your procedure requires a prolonged use of radiation, you will be notified and given written instructions for further action.  It is your responsibility to monitor the irradiated area for the 2 weeks following the procedure and to notify your physician if you are concerned that you have suffered a radiation induced injury.    All of the patient's questions were answered, patient is agreeable to proceed.  Consent signed and in chart.     Thank you for allowing our service to participate in Gary [name] 's care.    Electronically Signed: Laymon Coast, NP   03/11/2024, 1:12 PM     I spent a total of  15 Minutes in face to face in clinical consultation, greater than 50% of which was counseling/coordinating care for image guided L5 KP/VP possible biopsy   (A copy of this note was sent to the referring provider and the time of visit.)

## 2024-03-11 NOTE — Progress Notes (Signed)
 Patient assessed post procedure in short stay (KP today with Dr. Hughes)  He is siting up in bed, eating dinner. His wife is at bedside. He has urinated since returning to recovery.   Alert, awake, and oriented x3. Speech and comprehension intact. Fine motor and coordination intact and symmetric. 5/5 Strength bilaterally upper and lower ext. Sensation intact bilateral upper and lower ext.  No concerns with exam at this time. Pain well controlled.  Gary Lortie NP 03/11/2024 5:29 PM

## 2024-03-12 ENCOUNTER — Telehealth: Payer: Self-pay | Admitting: Cardiology

## 2024-03-12 NOTE — Telephone Encounter (Signed)
 Spoke to the patient, who reported that he had a back procedure performed yesterday and was informed by the nurse that he was experiencing irregular heartbeats. The patient stated that the nurse did not specify the type of irregularity but reported his heart rate was 99-100. The patient stated he is asymptomatic.  He has a history of PVCs. An appointment is scheduled for 03/31/24. The patient is unable to schedule sooner due to his recent back surgery. Will forward to the MD to make them aware.

## 2024-03-12 NOTE — Telephone Encounter (Signed)
 Patient c/o Palpitations:  STAT if patient reporting lightheadedness, shortness of breath, or chest pain  How long have you had palpitations/irregular HR/ Afib? Are you having the symptoms now?   No  Are you currently experiencing lightheadedness, SOB or CP?  No  Do you have a history of afib (atrial fibrillation) or irregular heart rhythm?   Yes  Have you checked your BP or HR? (document readings if available):   No  Are you experiencing any other symptoms?   Patient stated yesterday he had a back procedure at [facility] (Dr. Alton) and it was noted patient had irregular heartbeats at that time.   Patient wants advice on next steps.

## 2024-03-14 LAB — SURGICAL PATHOLOGY

## 2024-03-17 ENCOUNTER — Other Ambulatory Visit: Payer: Self-pay | Admitting: Interventional Radiology

## 2024-03-17 DIAGNOSIS — S32050G Wedge compression fracture of fifth lumbar vertebra, subsequent encounter for fracture with delayed healing: Secondary | ICD-10-CM

## 2024-03-25 ENCOUNTER — Other Ambulatory Visit: Payer: Self-pay | Admitting: Interventional Radiology

## 2024-03-25 DIAGNOSIS — S32050G Wedge compression fracture of fifth lumbar vertebra, subsequent encounter for fracture with delayed healing: Secondary | ICD-10-CM

## 2024-03-25 DIAGNOSIS — Z9889 Other specified postprocedural states: Secondary | ICD-10-CM

## 2024-03-25 NOTE — Progress Notes (Signed)
 Goals      Follow my doctor's care plan        MEDICARE WELLNESS VISIT   PROVIDERS RENDERING CARE Dr. Lenon, [facility] [place] [facility]   FUNCTIONAL ASSESSMENT  (1) Hearing: Demonstrates normal hearing in conversation.  (2) Risk of Falls: No reports of falls or abnormal balance. Gait is observed to be good upon observation.  (3) Home Safety; Home is safe and secure (4) Activities of Daily Living; Household chores and grooming are managed without problems. Personal finances are managed without problems.   DEPRESSION SCREENING There does not seem to be loss of interest in activities nor excess crying or changes in sleep or appetite.   COGNITIVE SCREENING Orientation is appropriate as are responses to questions and general conversation. No reports of forgetfulness or losing things.    PREVENTION PLAN Cardiovascular: Cholesterol follow closely  Diabetes: Yearly glucose Colon Cancer: 06 and 10-19 and due in 10-24 per path report Glaucoma: Eye exam yearly  Pneumonia: Pneumovax 2014, and 2019 and prevnar 13 in 8-19 Shingles: History of zostavax and shingrix 8-19 Covid; Has been vaccinated Influenza: Yearly Smoking Cessation: NA   OTHER PERSONALIZED HEALTH ADVISE More exercise and vegetable based diet  END OF LIFE CARE WANTS Full Code       Layman Lenon MD     [facility] 2/9 last 3 flowsheet values     05/24/2022 03/25/2024  PHQ-9 Depression Screening   Little interest or pleasure in doing things  0  Feeling down, depressed, or hopeless  0  (OBSOLETE) Little interest or pleasure in doing things 0   (OBSOLETE) Feeling down, depressed, or hopeless (or irritable for Teens only)? 1   (OBSOLETE) Total Score = 1       Depression Severity and Treatment Recommendations:  0-4= None  5-9= Mild / Treatment: Support, educate to call if worse; return in one month  10-14= Moderate / Treatment: Support, watchful waiting; Antidepressant or Psychotherapy  15-19=  Moderately severe / Treatment: Antidepressant OR Psychotherapy  >= 20 = Major depression, severe / Antidepressant AND Psychotherapy  Please note approximately 15 minutes was spent and depression screening by me and nursing staff.     We discussed an exercise program and its benefits as well as a healthy diet including healthy fats and vegetables. These are very important parts of healthy living and cardiovascular health. 15 minutes was devoted to this.      Gary [name] is a 73 y.o. male here for his annual exam with health maintenance   Chief Complaint  Annual exam   HISTORY OF PRESENT ILLNESS  Controlled type 2 diabetes mellitus with complication, without long-term current use of insulin (CMS-HCC) Following a diabetic diet and medications are tolerated as appropriate.    Benign essential HTN Taking medications without noted side effects or dizziness.    B12 deficiency B12 is replaced and followed  OSA (obstructive sleep apnea) Continues to use cpap consistently and is continuing to benefit from it's use.    Major depression in remission [facility]) Mood is doing well    Review of Systems; Constitutional; No weight loss, fever, chills, weakness  HEENT: No visual loss, blurred vision, hearing loss, ear pain, runny nose or sore throat SKIN; No rash or itching CARDIOVASCULAR; No chest pain, pressure. No palpitations or edema RESPIRATORY; No shortness of breath, cough or sputum GASTROINTESTINAL; No nausea, vomiting, diarrhea or dysphagia GENITOURINARY; No dysuria, new incontinence, suprapubic pain NEUROLOGICAL; No headache, dizziness, syncopy, tingling MUSCULOSKELETAL; No muscle or joint pain or  injuries HEMATOLOGICAL; No anemia, bleeding or abnormal bruising noted PSYCHIATRIC; No manic symptoms or severely blue mood ENDOCRINE; No sweating, temperature intolerance or polyuria, polydipsia  Patient Active Problem List  Diagnosis   Coronary artery disease   Mixed  hyperlipidemia   Major depression in remission ()   Controlled type 2 diabetes mellitus with complication, without long-term current use of insulin (CMS/HHS-HCC)   Chronic osteoarthritis   Health care maintenance   Benign essential HTN   OSA (obstructive sleep apnea)   B12 deficiency   Statin myopathy   Aortic atherosclerosis   Spinal stenosis of lumbar region with neurogenic claudication   Chronic pain syndrome    Past Medical History:  Diagnosis Date   Anxiety    Arthritis    BPH (benign prostatic hypertrophy)    CAD (coronary artery disease)    COPD (chronic obstructive pulmonary disease) (CMS/HHS-HCC)    Coronary atherosclerosis    DDD (degenerative disc disease)    Depression    DJD (degenerative joint disease)    neck, cervical spine   Emphysema of lung (CMS/HHS-HCC)    Hyperlipidemia    Migraines    Mitral regurgitation    mild   MRSA (methicillin resistant Staphylococcus aureus)    Rheumatoid arthritis(714.0) (CMS/HHS-HCC)    Tobacco abuse    Ulcer     Past Surgical History:  Procedure Laterality Date   COLONOSCOPY  11/04/2004   Dr. CHARM Punch @ [facility] - Hyperplastic Polyp (7 mm)   KNEE ARTHROSCOPY Left 10/10/2006   OATS   KNEE ARTHROSCOPY Left 04/2007   PKR   COLONOSCOPY  11/29/2010   Dr. SHAUNNA. Oh @ [facility] - FHPolyps(m)(s), rpt 5 yrs per PYO   EGD  01/09/2018   Barrett's Esophagus/Repeat 4yrs/TKT   COLONOSCOPY  01/09/2018   Hyperplastic colon polyp/PHx CP/REpeat 58yrs/TKT   Cardiac cath     CHOLECYSTECTOMY     DEVIATED SEPTUM REPAIR     EGD  11/04/2004, 04/24/2008, 11/29/2010   PH Barrett's, due rpt 11/2011 per Dr. MYRTIS [facility]    Family History  Problem Relation Name Age of Onset   Rheum arthritis Mother     Other Mother         rectal mass - benign   Coronary Artery Disease (Blocked arteries around heart) Mother     Colon polyps Mother     Hyperlipidemia (Elevated cholesterol) Father     Arthritis Sister      Colon polyps Sister     Arthritis Brother     Alcohol abuse Maternal Aunt         Alcoholic cirrhosis   Cirrhosis Maternal Aunt     Coronary Artery Disease (Blocked arteries around heart) Maternal Grandmother     Diabetes Paternal Grandmother     High blood pressure (Hypertension) Paternal Grandmother     Glaucoma Neg Hx     Macular degeneration Neg Hx      Allergies  Allergen Reactions   Niacin-Lovastatin Unknown and Other (See Comments)    unknown unknown   Atorvastatin Muscle Pain   Darvocet A500 [Propoxyphene N-Acetaminophen ] Nausea    Passed out after taking it on an empty stomach   Pravastatin Muscle Pain   Zocor [Simvastatin] Unknown      Social History   Socioeconomic History   Marital status: Married  Tobacco Use   Smoking status: Former    Current packs/day: 0.00    Types: Cigarettes    Quit date: 01/08/2013    Years since quitting: 11.2  Smokeless tobacco: Never  Vaping Use   Vaping status: Never Used  Substance and Sexual Activity   Alcohol use: No    Alcohol/week: 0.0 standard drinks of alcohol   Drug use: No   Sexual activity: Defer   Social Drivers of Corporate Investment Banker Strain: Low Risk  (03/25/2024)   Overall Financial Resource Strain (CARDIA)    Difficulty of Paying Living Expenses: Not hard at all  Food Insecurity: No Food Insecurity (03/25/2024)   Hunger Vital Sign    Worried About Running Out of Food in the Last Year: Never true    Ran Out of Food in the Last Year: Never true  Transportation Needs: No Transportation Needs (03/25/2024)   PRAPARE - Administrator, Civil Service (Medical): No    Lack of Transportation (Non-Medical): No  Housing Stability: Low Risk  (03/25/2024)   Housing Stability Vital Sign    Unable to Pay for Housing in the Last Year: No    Number of Times Moved in the Last Year: 0    Homeless in the Last Year: No      Current Outpatient Medications:    acetaminophen   (TYLENOL ) 500 mg capsule, Take 500 mg by mouth every 6 (six) hours as needed for Fever or Pain., Disp: , Rfl:    albuterol (PROAIR RESPICLICK) 90 mcg/actuation inhaler, Inhale 2 inhalations into the lungs every 6 (six) hours as needed for Wheezing Inhale as needed into the lungs., Disp: 1 inhalation, Rfl: 2   aspirin 81 MG EC tablet, Take 81 mg by mouth once daily, Disp: , Rfl:    busPIRone (BUSPAR) 15 MG tablet, Take 15 mg by mouth 2 (two) times daily, Disp: , Rfl:    clobetasol (TEMOVATE) 0.05 % ointment, clobetasol 0.05 % topical ointment, Disp: , Rfl:    clotrimazole-betamethasone  (LOTRISONE) 1-0.05 % cream, clotrimazole-betamethasone  1 %-0.05 % topical cream, Disp: , Rfl:    DULoxetine  (CYMBALTA ) 60 MG DR capsule, Take 1 capsule (60 mg total) by mouth once daily, Disp: 90 capsule, Rfl: 3   finasteride (PROSCAR) 5 mg tablet, TAKE 1 TABLET BY MOUTH DAILY, Disp: 90 tablet, Rfl: 1   gatifloxacin (ZYMAXID) 0.5 % ophthalmic solution, , Disp: , Rfl:    hydroCHLOROthiazide (HYDRODIURIL) 12.5 MG tablet, TAKE 1 TABLET BY MOUTH ONCE DAILY, Disp: 90 tablet, Rfl: 1   meloxicam (MOBIC) 7.5 MG tablet, Take 1 tablet (7.5 mg total) by mouth once daily, Disp: 30 tablet, Rfl: 0   NEXIUM 40 mg DR capsule, TAKE 1 CAPSULE BY MOUTH ONCE DAILY, Disp: 90 capsule, Rfl: 1   oxyCODONE -acetaminophen  (PERCOCET) 10-325 mg tablet, Take 1 tablet by mouth every 8 (eight) hours as needed, Disp: , Rfl:    potassium chloride (KLOR-CON M20) 20 MEQ ER tablet, TAKE 1 TABLET BY MOUTH ONCE DAILY, Disp: 90 tablet, Rfl: 3   pregabalin  (LYRICA ) 75 MG capsule, Take 75 mg by mouth at bedtime as needed, Disp: , Rfl:    sildenafil , antihypertensive, (REVATIO ) 20 mg tablet, Take 3 to 5 tablets two hours before intercouse on an empty stomach.  Do not take with nitrates., Disp: , Rfl:    tamsulosin  (FLOMAX ) 0.4 mg capsule, Take 0.4 mg by mouth once daily   , Disp: , Rfl:   Vitals:   03/25/24 0935  BP: 122/71  Pulse: 52    Body mass index is 29.76 kg/m. No acute distress, pleasant  HEENT: Normocephalic and Atraumatic, Oropharynx is clear, Tympanic membranes clear, conjunctiva have normal color  NECK: No bruits, thyromegalia or adenopathy is noted CHEST; No distress, normal to inspection, clear to auscultation CARDIOVASCULAR; Regular rate and rhythm, no murmurs rubs or gallops appreciated. Peripheral pulses were palpated and present.  ABDOMEN; Soft and flat and nontender with bowel sounds appreciated in the normal range EXTREMITIES; No clubbing cyanosis or edema NEUROLOGICAL; Alert and responsive with good insight. Motor function and sensation are intact. Reflexes are present SKIN; No suspicious lesions are noted.    Same Day Visit on 02/15/2024  Component Date Value Ref Range Status   WBC (White Blood Cell Count) 02/15/2024 11.6 (H)  4.1 - 10.2 103/uL Final   RBC (Red Blood Cell Count) 02/15/2024 4.02 (L)  4.69 - 6.13 106/uL Final   Hemoglobin 02/15/2024 13.3 (L)  14.1 - 18.1 gm/dL Final   Hematocrit 88/92/7974 40.1  40.0 - 52.0 % Final   MCV (Mean Corpuscular Volume) 02/15/2024 99.8  80.0 - 100.0 fl Final   MCH (Mean Corpuscular Hemoglobin) 02/15/2024 33.1 (H)  27.0 - 31.2 pg Final   MCHC (Mean Corpuscular Hemoglobin * 02/15/2024 33.2  32.0 - 36.0 gm/dL Final   Platelet Count 02/15/2024 329  150 - 450 103/uL Final   RDW-CV (Red Cell Distribution Widt* 02/15/2024 12.6  11.6 - 14.8 % Final   MPV (Mean Platelet Volume) 02/15/2024 8.6 (L)  9.4 - 12.4 fl Final   Neutrophils 02/15/2024 9.58 (H)  1.50 - 7.80 103/uL Final   Lymphocytes 02/15/2024 1.20  1.00 - 3.60 103/uL Final   Monocytes 02/15/2024 0.62  0.00 - 1.50 103/uL Final   Eosinophils 02/15/2024 0.07  0.00 - 0.55 103/uL Final   Basophils 02/15/2024 0.03  0.00 - 0.09 103/uL Final   Neutrophil % 02/15/2024 82.8 (H)  32.0 - 70.0 % Final   Lymphocyte % 02/15/2024 10.4  10.0 - 50.0 % Final   Monocyte % 02/15/2024 5.4  4.0 - 13.0 %  Final   Eosinophil % 02/15/2024 0.6 (L)  1.0 - 5.0 % Final   Basophil% 02/15/2024 0.3  0.0 - 2.0 % Final   Immature Granulocyte % 02/15/2024 0.5  <=0.7 % Final   Immature Granulocyte Count 02/15/2024 0.06  <=0.06 10^3/L Final   Glucose 02/15/2024 146 (H)  70 - 110 mg/dL Final   Sodium 88/92/7974 140  136 - 145 mmol/L Final   Potassium 02/15/2024 4.5  3.6 - 5.1 mmol/L Final   Chloride 02/15/2024 101  97 - 109 mmol/L Final   Carbon Dioxide (CO2) 02/15/2024 31.5  22.0 - 32.0 mmol/L Final   Urea Nitrogen (BUN) 02/15/2024 33 (H)  7 - 25 mg/dL Final   Creatinine 88/92/7974 0.9  0.7 - 1.3 mg/dL Final   Glomerular Filtration Rate (eGFR) 02/15/2024 90  >60 mL/min/1.73sq m Final   Calcium 02/15/2024 9.8  8.7 - 10.3 mg/dL Final   AST  88/92/7974 42 (H)  8 - 39 U/L Final   ALT  02/15/2024 40  6 - 57 U/L Final   Alk Phos (alkaline Phosphatase) 02/15/2024 81  34 - 104 U/L Final   Albumin 02/15/2024 4.5  3.5 - 4.8 g/dL Final   Bilirubin, Total 02/15/2024 0.9  0.3 - 1.2 mg/dL Final   Protein, Total 02/15/2024 7.1  6.1 - 7.9 g/dL Final   A/G Ratio 88/92/7974 1.7  1.0 - 5.0 gm/dL Final  Appointment on 96/71/7974  Component Date Value Ref Range Status   Glucose 07/06/2023 140 (H)  70 - 110 mg/dL Final   Sodium 96/71/7974 142  136 - 145 mmol/L Final  Potassium 07/06/2023 4.0  3.6 - 5.1 mmol/L Final   Chloride 07/06/2023 104  97 - 109 mmol/L Final   Carbon Dioxide (CO2) 07/06/2023 31.6  22.0 - 32.0 mmol/L Final   Calcium 07/06/2023 9.7  8.7 - 10.3 mg/dL Final   Urea Nitrogen (BUN) 07/06/2023 33 (H)  7 - 25 mg/dL Final   Creatinine 96/71/7974 1.0  0.7 - 1.3 mg/dL Final   Glomerular Filtration Rate (eGFR) 07/06/2023 80  >60 mL/min/1.73sq m Final   BUN/Crea Ratio 07/06/2023 33.0 (H)  6.0 - 20.0 Final   Anion Gap w/K 07/06/2023 10.4  6.0 - 16.0 Final   Hemoglobin A1C 07/06/2023 6.9 (H)  4.2 - 5.6 % Final   Average Blood Glucose (Calc) 07/06/2023 151  mg/dL Final    Cholesterol, Total 07/06/2023 195  100 - 200 mg/dL Final   Triglyceride 96/71/7974 764 (H)  35 - 199 mg/dL Final   HDL (High Density Lipoprotein) Cho* 07/06/2023 32.4  29.0 - 71.0 mg/dL Final   Cholesterol/HDL Ratio 07/06/2023 6.0   Final   Creatinine, Random Urine 07/06/2023 168.9  40.0 - 300.0 mg/dL Final   Urine Albumin, Random 07/06/2023 21    mg/L Final   Urine Albumin/Creatinine Ratio 07/06/2023 12.4  <30.0 ug/mg Final   Thyroid  Stimulating Hormone (TSH) 07/06/2023 1.117  0.450-5.330 uIU/ml uIU/mL Final    ASSESSMENT  AND PLAN:  Diagnoses and all orders for this visit:  Routine general medical examination at a health care facility  Depression screening -     Depression Screen -(PHQ- 2/9, BDI)  Health care maintenance  B12 deficiency Assessment & Plan: B12 is replaced and followed   Benign essential HTN Assessment & Plan: Taking medications without noted side effects or dizziness.    Orders: -     Comprehensive Metabolic Panel (CMP) -     Hemoglobin A1C -     Lipid Panel w/calc LDL -     Microalbumin/Creatinine Ratio, Random Urine  Controlled type 2 diabetes mellitus with complication, without long-term current use of insulin (CMS/HHS-HCC) Assessment & Plan: Following a diabetic diet and medications are tolerated as appropriate.    Orders: -     Comprehensive Metabolic Panel (CMP) -     Hemoglobin A1C -     Lipid Panel w/calc LDL -     Microalbumin/Creatinine Ratio, Random Urine -     X-ray lumbar spine 2 to 3 views; Future  Major depression in remission () Assessment & Plan: Mood is doing well    OSA (obstructive sleep apnea) Assessment & Plan: Continues to use cpap consistently and is continuing to benefit from it's use.     Statin myopathy -     X-ray lumbar spine 2 to 3 views; Future      Goals      Follow my doctor's care plan        Legs are weaker and numb and more. Urination is generally good, not incontinence.  Likely mri  post xray. Warned of emergency symptoms

## 2024-03-31 ENCOUNTER — Ambulatory Visit: Admitting: Cardiology

## 2024-03-31 ENCOUNTER — Ambulatory Visit
Admission: RE | Admit: 2024-03-31 | Discharge: 2024-03-31 | Disposition: A | Source: Ambulatory Visit | Attending: Interventional Radiology

## 2024-03-31 ENCOUNTER — Inpatient Hospital Stay: Admission: RE | Admit: 2024-03-31

## 2024-03-31 ENCOUNTER — Other Ambulatory Visit

## 2024-03-31 DIAGNOSIS — Z9889 Other specified postprocedural states: Secondary | ICD-10-CM

## 2024-03-31 DIAGNOSIS — S32050G Wedge compression fracture of fifth lumbar vertebra, subsequent encounter for fracture with delayed healing: Secondary | ICD-10-CM

## 2024-03-31 HISTORY — PX: IR RADIOLOGIST EVAL & MGMT: IMG5224

## 2024-03-31 NOTE — Progress Notes (Signed)
 "   Reason for visit: Hx symptomatic L5 vertebral fracture. Failed conservative management, now s/p L5 BKP   Care Team(s) Primary Care; Lenon Layman ORN, MD Pain Medicine; Suzzane Charleston PA and Marcelino Nurse, MD  Virtual Visit via Video Conferencing  I connected with Gary [name] on 03/31/24 by video-telephonic conference and verified that I am speaking with the correct person using two identifiers. I discussed the limitations, risks, security and privacy concerns of performing an evaluation and management service by tele-visit and the availability of in-person appointments. As part of this video-telephonic encounter, no in-person exam was conducted.  History of present illness:  Gary [name] is a 73 y.o. male with a history of cervical and lumbar degenerative disc disease, arthritis, lumbar stenosis and spondylosis, and lumbar facet arthropathy who was recently evaluated by his PCP for increasing lower back pain with radiation down his left leg. Patient is followed by Pain Medicine for his osteoarthritis and lower back pain. He has undergone several previous SI joint injections, lumbar epidural injections, and a lumbar facet nerve block most recently as of 08/2023.   Pt reported that he was helping his wife in the yard in mid October and could not get out of bed on the 3rd day of working because of low back pain. He also had a large bruise that formed at his R flank but denied any fall, injury or accident.  He reported worsening back pain to his PCP and subsequently underwent recent CT AP (02/15/24) which demonstrated an acute/subacute fracture of the inferior endplate of L5 with approximately 10% vertebral body height loss.   He underwent uneventful L5 BKP with me on 03/11/24 and was discharged uneventfully on the same day. Post procedure he reported some access site discomfort that was adequately controlled with PO Rxs and was doing well, ambulating without a walker and completing  his ADLs with ease. Recently, he reports that he had new pain in his lower back which radiated to his BLE. Given concern for a juxta-level fracture, a CT L spine was obtained and he presents for post procedural follow up and imaging review. He reports normal bladder and bowel function.   Review of Systems: Pertinent positives are indicated in the HPI above.  All other systems are negative.   Past Medical History:  Diagnosis Date   Anxiety    Asthma    Benign essential HTN 06/24/2015   Last Assessment & Plan:  Taking medications without noted side effects or dizziness.   Overview:  Last Assessment & Plan:  Is compliant with hypertensive medications without clear side effects or lack of control.     BPH (benign prostatic hyperplasia)    COPD (chronic obstructive pulmonary disease) (HCC)    Coronary artery disease 02/16/2014   Overview:  Minimal disease by cath 12/13  Last Assessment & Plan:  Seems to be tolerating medical regimen without significant side effects and symptoms such as worsening chest pain are not noted.   Overview:  Overview:  Minimal disease by cath 12/13  Last Assessment & Plan:  Seems to be tolerating medical regimen without significant side effects and symptoms such as worsening chest pain are not no   DDD (degenerative disc disease), cervical    Degenerative disc disease, cervical 01/22/2017   Depression    DJD (degenerative joint disease)    Emphysema of lung (HCC)    Heel pain 10/10/2017   Hyperglobulinemia    Lumbar degenerative disc disease 01/22/2017   Major depression  in remission 03/10/2014   Last Assessment & Plan:  Mood is doing well on meds Overview:  Last Assessment & Plan:  Mood is doing well on meds    Migraines    Mitral regurgitation    Mixed hyperlipidemia 02/16/2014   Last Assessment & Plan:  Diet for healthy cholesterol is being attempted and no clear myalgia's or other side effects are noted.   Overview:  Last Assessment & Plan:  Low fat diet is being  attempted and no significant side effects such as myalgia's are noted.     Neck pain 01/24/2017   OSA (obstructive sleep apnea) 07/05/2016   Last Assessment & Plan:  Continues to use cpap consistently and is continuing to benefit from it's use.     Osteoarthritis of knee 08/12/2014   Overview:  Right knee is worse now  Last Assessment & Plan:  Diffuse pain on back in hips and knees also. Went to a pain clinic  Overview:  Overview:  Right knee is worse now  Last Assessment & Plan:  Diffuse pain on back in hips and knees also. Went to a pain clinic    Prostate cancer (HCC) 2019   Rad Tx's.    RA (rheumatoid arthritis) (HCC)    Spinal stenosis of lumbar region with neurogenic claudication 04/18/2016   Spondylosis without myelopathy or radiculopathy, lumbar region 01/22/2017   Venous insufficiency of both lower extremities 11/07/2016    Past Surgical History:  Procedure Laterality Date   CARDIAC CATHETERIZATION     CHOLECYSTECTOMY     COLONOSCOPY     COLONOSCOPY WITH PROPOFOL  N/A 01/09/2018   Procedure: COLONOSCOPY WITH PROPOFOL ;  Surgeon: Toledo, Ladell POUR, MD;  Location: [facility];  Service: Gastroenterology;  Laterality: N/A;   deviated septum repair     ESOPHAGOGASTRODUODENOSCOPY (EGD) WITH PROPOFOL  N/A 01/09/2018   Procedure: ESOPHAGOGASTRODUODENOSCOPY (EGD) WITH PROPOFOL ;  Surgeon: Toledo, Ladell POUR, MD;  Location: [facility];  Service: Gastroenterology;  Laterality: N/A;   IR KYPHO LUMBAR INC FX REDUCE BONE BX UNI/BIL CANNULATION INC/IMAGING  03/11/2024   IR RADIOLOGIST EVAL & MGMT  02/28/2024   IR RADIOLOGIST EVAL & MGMT  03/31/2024   KNEE SURGERY Left    NASAL SEPTUM SURGERY      Allergies: Niacin-lovastatin er, Atorvastatin, Simvastatin, and Propoxyphene  Medications: Prior to Admission medications  Medication Sig Start Date End Date Taking? Authorizing Provider  acetaminophen  (TYLENOL ) 500 MG tablet Take as needed by mouth.     [provider]  Albuterol Sulfate  108 (90 Base) MCG/ACT AEPB Inhale as needed into the lungs.     [provider]  aspirin EC 81 MG tablet Take 1 tablet by mouth daily. 12/27/22   [provider]  cholecalciferol (VITAMIN D) 1000 units tablet Take by mouth.    [provider]  clobetasol ointment (TEMOVATE) 0.05 % Apply 1 application topically 2 (two) times daily.    [provider]  clotrimazole-betamethasone  (LOTRISONE) cream Apply 1 application topically 2 (two) times daily.    [provider]  diclofenac sodium (VOLTAREN) 1 % GEL Apply topically 4 (four) times daily.    [provider]  DULoxetine  (CYMBALTA ) 60 MG capsule Take 1 capsule (60 mg total) by mouth daily. 06/05/23 05/30/24  Marcelino Nurse, MD  esomeprazole (NEXIUM) 40 MG capsule Take 40 mg by mouth daily. 08/30/18   [provider]  ezetimibe (ZETIA) 10 MG tablet Take 10 mg by mouth daily.  08/21/17   [provider]  fenofibrate (  TRICOR) 48 MG tablet Take 48 mg by mouth daily.    [provider]  finasteride (PROSCAR) 5 MG tablet Take 1 tablet by mouth daily. 08/21/23   [provider]  fluticasone-salmeterol (ADVAIR) 100-50 MCG/ACT AEPB Inhale into the lungs. 05/31/21 02/04/24  [provider]  hydrochlorothiazide (HYDRODIURIL) 12.5 MG tablet Take 12.5 mg by mouth daily.    [provider]  Multiple Vitamin (MULTIVITAMIN) tablet Take 1 tablet by mouth daily.    [provider]  naproxen sodium (ALEVE) 220 MG tablet Take 220 mg by mouth 2 (two) times daily as needed.    [provider]  NEXLIZET 180-10 MG TABS Take 1 tablet by mouth daily. 08/18/23   [provider]  oxyCODONE -acetaminophen  (PERCOCET) 10-325 MG tablet Take 1 tablet by mouth every 8 (eight) hours as needed for pain. Must last 30 days. 03/01/24 03/31/24  Patel, Seema K, NP  oxyCODONE -acetaminophen  (PERCOCET) 10-325 MG tablet Take 1 tablet by mouth every 8 (eight) hours as needed  for pain. Must last 30 days. 03/31/24 04/30/24  Patel, Seema K, NP  oxyCODONE -acetaminophen  (PERCOCET) 10-325 MG tablet Take 1 tablet by mouth every 8 (eight) hours as needed for pain. Must last 30 days. 04/30/24 05/30/24  Patel, Seema K, NP  potassium chloride SA (KLOR-CON M) 20 MEQ tablet Take 20 mEq by mouth daily. 03/27/22   [provider]  pregabalin  (LYRICA ) 75 MG capsule Take 1 capsule (75 mg total) by mouth at bedtime as needed. 02/04/24 10/31/24  Patel, Seema K, NP  sildenafil  (REVATIO ) 20 MG tablet Take 3 to 5 tablets two hours before intercouse on an empty stomach.  Do not take with nitrates. Patient not taking: Reported on 02/28/2024 10/10/17   Helon Kirsch A, PA-C  tamsulosin  (FLOMAX ) 0.4 MG CAPS capsule TAKE 1 CAPSULE EVERY DAY AFTER SUPPER 06/23/22   Lenn Aran, MD  tamsulosin  (FLOMAX ) 0.4 MG CAPS capsule Take 1 capsule (0.4 mg total) by mouth daily after supper. 09/05/23   Lenn Aran, MD     Family History  Problem Relation Age of Onset   Heart disease Mother     Social History   Socioeconomic History   Marital status: Married    Spouse name: Not on file   Number of children: Not on file   Years of education: Not on file   Highest education level: Not on file  Occupational History   Not on file  Tobacco Use   Smoking status: Former    Current packs/day: 0.00    Average packs/day: 1.5 packs/day for 45.0 years (67.5 ttl pk-yrs)    Types: Cigarettes    Start date: 28    Quit date: 2014    Years since quitting: 12.0   Smokeless tobacco: Never  Vaping Use   Vaping status: Never Used  Substance and Sexual Activity   Alcohol use: No   Drug use: No   Sexual activity: Not on file  Other Topics Concern   Not on file  Social History Narrative   Not on file   Social Drivers of Health   Tobacco Use: Medium Risk (03/11/2024)   Patient History    Smoking Tobacco Use: Former    Smokeless Tobacco Use: Never    Passive Exposure: Not on file   Financial Resource Strain: Low Risk  (03/25/2024)   Received from [facility]   Overall Financial Resource Strain (CARDIA)    Difficulty of Paying Living Expenses: Not hard at all  Food Insecurity:  No Food Insecurity (03/25/2024)   Received from [facility]   Epic    Within the past 12 months, you worried that your food would run out before you got the money to buy more.: Never true    Within the past 12 months, the food you bought just didn't last and you didn't have money to get more.: Never true  Transportation Needs: No Transportation Needs (03/25/2024)   Received from [facility] - Transportation    In the past 12 months, has lack of transportation kept you from medical appointments or from getting medications?: No    Lack of Transportation (Non-Medical): No  Physical Activity: Not on file  Stress: Not on file  Social Connections: Not on file  Depression (PHQ2-9): Low Risk (02/04/2024)   Depression (PHQ2-9)    PHQ-2 Score: 2  Alcohol Screen: Not on file  Housing: Low Risk  (03/25/2024)   Received from [facility]   Epic    In the last 12 months, was there a time when you were not able to pay the mortgage or rent on time?: No    In the past 12 months, how many times have you moved where you were living?: 0    At any time in the past 12 months, were you homeless or living in a shelter (including now)?: No  Utilities: Not At Risk (03/25/2024)   Received from [facility]   Epic    In the past 12 months has the electric, gas, oil, or water company threatened to shut off services in your home?: No  Health Literacy: Not on file   Vital Signs: There were no vitals taken for this visit.  Physical Exam Deferred secondary to virtual visit  Imaging:    CT L spine, 03/31/24 IMPRESSION:  1. Interval augmentation of the previously described L5 fracture with similar degree of height  loss compared with the prior MRI.  2. New mildly displaced fracture of an anterior osteophyte along the right S1 superior endplate.  3. Widespread disc and facet degeneration throughout the lumbar spine with advanced multilevel spinal and neural foraminal stenosis, similar to the prior MRI.    IR L5 BKP, 03/11/24 IMPRESSION:  1. Successful L5 vertebral body biopsy and cement augmentation with balloon kyphoplasty.   2. Inferior endplate fracture with some cement extravasation into the L5-S1 disc space. No posterior extravasation into the spinal canal, epidural venous contamination, or along the pedicular access.    Per CMS PQRS reporting requirements (PQRS Measure 24): Given the patient's age of greater than 50 and the fracture site (hip, distal radius, or spine), the patient should be tested for osteoporosis using DXA, and the appropriate treatment considered based on the DXA result.  No results found.  Labs:  CBC: Recent Labs    03/11/24 1250  WBC 6.2  HGB 12.7*  HCT 38.3*  PLT 283    COAGS: Recent Labs    03/11/24 1250  INR 1.0    BMP: Recent Labs    02/15/24 1444 03/11/24 1250  NA  --  141  K  --  4.0  CL  --  105  CO2  --  25  GLUCOSE  --  157*  BUN  --  29*  CALCIUM  --  9.7  CREATININE 1.00 0.83  GFRNONAA  --  >60   PATHOLOGY  REPORT OF SURGICAL PATHOLOGY  Accession #: [id number] Patient Name: SCHONEMAN, Cartel Visit # :  [id number] MRN: [id number]  FINAL DIAGNOSIS       1. Bone, biopsy, LS vertebral body :      BONE WITH MARKED REACTIVE / REGENERATIVE CHANGES.      NO EVIDENCE OF METASTATIC CARCINOMA.      SEE NOTE.       Diagnosis Note : Immunohistochemical stain for NKX3.1 was performed and is negative. There is no evidence of metastatic carcinoma.Controls worked appropriately.   Assessment and Plan:  73 y/o M w PMHx significant for lumbar DJD and stenosis w acute, atraumatic L5 vertebral body fractures on CT  AP (02/15/24). Failed  conservative management. Pt is s/p uneventful ambulatory L5 BKP on 03/11/24    *No access site or procedural complication concern.  *OK for physical therapy and rehabilitation from a VIR perspective. *recovery complicated by disc related disease and neuroforaminal stenosis  *additional management by Pain Medicine service, and consider NSGY / Orthopedic Spine evaluation *Bone density testing w DEXA, Vit D and Ca2+ supplementation per PCP   Per CMS PQRS reporting requirements (PQRS Measure 24): Given the patient's age of greater than 50 and the fracture site (hip, distal radius, or spine), the patient should be tested for osteoporosis using DXA, and the appropriate treatment considered based on the DXA results.   Thank you for allowing us  to participate in the care of this Patient. Please contact me with questions, concerns, or if new issues arise.  Electronically Signed:  Thom Hall, MD [facility]   Pager. [phone] Clinic. [phone]  I spent a total of 25 Minutes of non-face-to-face time in clinical consultation, greater than 50% of which was counseling/coordinating care for Mr ASHAAD [name]' evaluation for treatment of symptomatic L5 verteabral fractures.  The patient was physically located in [place]  or a state in which I am permitted to provide care. The encounter was reasonable and appropriate under the circumstances given the patient's presentation at the time.  "

## 2024-04-09 ENCOUNTER — Ambulatory Visit: Attending: Cardiology | Admitting: Cardiology

## 2024-04-09 NOTE — Progress Notes (Deleted)
 "     Electrophysiology Clinic Note    Date:  04/09/2024  Patient ID:  Gary [name], Gary [name] [birth year], MRN [id number] PCP:  Lenon Layman ORN, MD  Cardiologist:  Redell Cave, MD  Electrophysiologist:  Fonda Kitty, MD  Electrophysiology APP:  Janyiah Silveri, NP    ***refresh  Discussed the use of AI scribe software for clinical note transcription with the patient, who gave verbal consent to proceed.   Patient Profile    Chief Complaint: ***  History of Present Illness: Gary [name] is a 73 y.o. male with PMH notable for PVCs, non-obs CAD, HTN, HLD, OSA on CPAP, T2 DM; seen today for Fonda Kitty, MD (previously Dr. Cindie) for routine electrophysiology followup.   He last saw Dr. Cindie 11/2023 for initial EP evaluation of PVCs.  He was asymptomatic of PVCs with normal LVEF.  They discussed treatment and patient favored a more conservative management strategy, and patient was recommended to follow-up with EP as needed.   Patient called clinic earlier this month reporting that he had had a recent back procedure and nursing noted an irregular heart rate.  He was asymptomatic, and today's appt is follow-up of phone call.   On follow-up today, *** palps   Since last being seen in our clinic the patient reports doing ***.  he denies chest pain, palpitations, dyspnea, PND, orthopnea, nausea, vomiting, dizziness, syncope, edema, weight gain, or early satiety.      Arrhythmia/Device History No specialty comments available.    ROS:  Please see the history of present illness. All other systems are reviewed and otherwise negative.    Physical Exam    VS:  There were no vitals taken for this visit. BMI: There is no height or weight on file to calculate BMI.           Wt Readings from Last 3 Encounters:  02/28/24 192 lb (87.1 kg)  02/04/24 199 lb (90.3 kg)  11/28/23 208 lb (94.3 kg)     ***  GEN- The patient is well appearing, alert and oriented x 3  today.   Lungs- Clear to ausculation bilaterally, normal work of breathing.  Heart- {Blank single:19197::Regular,Irregularly irregular} rate and rhythm, ***no murmurs, rubs or gallops Extremities- {EDEMA LEVEL:28147::No} peripheral edema, warm, dry   Studies Reviewed   Previous EP, cardiology notes.    EKG {ACTION; IS/IS WNU:78978602} ordered. Personal review of EKG from {Blank single:19197::today,***} shows:  ***        Long term monitor, 09/27/2023 Average heart rate 90, range 59-226. Nonsustained VT noted, fastest lasting up to 15 beats. Frequent PVCs noted, 17.9% burden. Episodes of nonsustained SVT also noted, longest lasting 9 beats. Occasional PACs noted, 2.1% burden.  TTE, 07/24/2022  1. Left ventricular ejection fraction, by estimation, is 55 to 60%. The left ventricle has normal function. The left ventricle has no regional wall motion abnormalities. Left ventricular diastolic parameters were normal.   2. Right ventricular systolic function is normal. The right ventricular size is normal.   3. The mitral valve is normal in structure. Mild mitral valve regurgitation.   4. The aortic valve is tricuspid. Aortic valve regurgitation is not visualized. Aortic valve sclerosis is present, with no evidence of aortic valve stenosis.   5. The inferior vena cava is normal in size with greater than 50% respiratory variability, suggesting right atrial pressure of 3 mmHg.   Coronary CT, 07/24/2022 1. Coronary calcium score of 32.3. This was 27th percentile for age and sex matched  control.  2. Normal coronary origin with right dominance.  3. Minimal stenosis in proximal RCA and LCx (<25%).  4. CAD-RADS 1. Minimal non-obstructive CAD (0-24%). Consider non-atherosclerotic causes of chest pain. Consider preventive therapy and risk factor modification.    Assessment and Plan     #) PVC   #) ***   {Are you ordering a CV Procedure (e.g. stress test, cath, DCCV, TEE, etc)?    Press F2        :[id number]}   Current medicines are reviewed at length with the patient today.   The patient {ACTIONS; HAS/DOES NOT HAVE:19233} concerns regarding his medicines.  The following changes were made today:  {NONE DEFAULTED:18576}  Labs/ tests ordered today include: *** No orders of the defined types were placed in this encounter.    Disposition: Follow up with {EPMDS:28135::EP Team} or EP APP {EPFOLLOW UP:28173}   Signed, Chantal Needle, NP  04/09/2024  12:07 PM  Electrophysiology [facility] "

## 2024-04-14 ENCOUNTER — Encounter: Payer: Self-pay | Admitting: Neurosurgery

## 2024-04-14 ENCOUNTER — Ambulatory Visit: Admitting: Neurosurgery

## 2024-04-14 VITALS — BP 132/86 | Ht 67.0 in | Wt 192.0 lb

## 2024-04-14 DIAGNOSIS — M51361 Other intervertebral disc degeneration, lumbar region with lower extremity pain only: Secondary | ICD-10-CM | POA: Diagnosis not present

## 2024-04-14 DIAGNOSIS — M47816 Spondylosis without myelopathy or radiculopathy, lumbar region: Secondary | ICD-10-CM

## 2024-04-14 DIAGNOSIS — M4726 Other spondylosis with radiculopathy, lumbar region: Secondary | ICD-10-CM | POA: Diagnosis not present

## 2024-04-14 DIAGNOSIS — S32008S Other fracture of unspecified lumbar vertebra, sequela: Secondary | ICD-10-CM | POA: Diagnosis not present

## 2024-04-14 DIAGNOSIS — M48061 Spinal stenosis, lumbar region without neurogenic claudication: Secondary | ICD-10-CM | POA: Diagnosis not present

## 2024-04-14 NOTE — Patient Instructions (Signed)

## 2024-04-14 NOTE — Progress Notes (Signed)
 "   Referring Physician:  Patel, Seema K, NP [address] [place],  [place] [address]  Primary Physician:  Lenon Layman ORN, MD  Discussed the use of AI scribe software for clinical note transcription with the patient, who gave verbal consent to proceed.  History of Present Illness Gary [name] is a 74 year old male with lumbar vertebral fracture status post kyphoplasty, lumbar spinal stenosis, and right L5 radiculopathy who presents with persistent, severe right lower extremity and back pain.  He has severe pain in the right hip and right lower back radiating down the right leg to the knee, groin, and occasionally the dorsum of the foot. Pain is worst in the morning when he gets up, often preventing ambulation and forcing him back to bed. Standing and walking worsen the pain. Sitting in a specific position in his lift recliner provides only partial relief. He describes the pain as unbearable with marked limitation in daily activities and cannot tolerate straight-back chairs. He uses a walker for ambulation.  He has numbness in the right knee and intermittent tingling in the right leg. The pain previously involved the left leg but is now predominantly right-sided. He denies bowel or bladder dysfunction. He notes gait changes with the right leg turning inward.  He underwent kyphoplasty for lumbar vertebral fracture several months ago without preceding fall or major trauma. CT imaging in December 2025 was obtained. He has not done recent physical therapy for back pain.  He has had multiple epidural steroid injections over several years. The most recent injection in 2025 relieved pain for only two days compared with up to three months from prior injections. He takes oxycodone  three times daily plus alternating naproxen and acetaminophen , with his wife organizing his dosing.  He has spinal arthritis, osteophytes, scoliosis, and spinal stenosis. He had a left partial  knee arthroplasty and has declined right knee replacement.  Conservative measures:  Physical therapy: None Recent Multimodal medical therapy including regular antiinflammatories: Tylenol , Cymbalta ,Oxycodone , Aleve, Lyrica   Injections: 02/16/2021 L3-L4 ESI   Past Surgery: 03/11/2024 Kyphoplasty  The symptoms are causing a significant impact on the patient's life.   I have utilized the care everywhere function in epic to review the outside records available from external health systems.  Review of Systems:  A 10 point review of systems is negative, except for the pertinent positives and negatives detailed in the HPI.  Past Medical History: Past Medical History:  Diagnosis Date   Anxiety    Asthma    Benign essential HTN 06/24/2015   Last Assessment & Plan:  Taking medications without noted side effects or dizziness.   Overview:  Last Assessment & Plan:  Is compliant with hypertensive medications without clear side effects or lack of control.     BPH (benign prostatic hyperplasia)    COPD (chronic obstructive pulmonary disease) (HCC)    Coronary artery disease 02/16/2014   Overview:  Minimal disease by cath 12/13  Last Assessment & Plan:  Seems to be tolerating medical regimen without significant side effects and symptoms such as worsening chest pain are not noted.   Overview:  Overview:  Minimal disease by cath 12/13  Last Assessment & Plan:  Seems to be tolerating medical regimen without significant side effects and symptoms such as worsening chest pain are not no   DDD (degenerative disc disease), cervical    Degenerative disc disease, cervical 01/22/2017   Depression    DJD (degenerative joint disease)  Emphysema of lung (HCC)    Heel pain 10/10/2017   Hyperglobulinemia    Lumbar degenerative disc disease 01/22/2017   Major depression in remission 03/10/2014   Last Assessment & Plan:  Mood is doing well on meds Overview:  Last Assessment & Plan:  Mood is doing well on meds     Migraines    Mitral regurgitation    Mixed hyperlipidemia 02/16/2014   Last Assessment & Plan:  Diet for healthy cholesterol is being attempted and no clear myalgia's or other side effects are noted.   Overview:  Last Assessment & Plan:  Low fat diet is being attempted and no significant side effects such as myalgia's are noted.     Neck pain 01/24/2017   OSA (obstructive sleep apnea) 07/05/2016   Last Assessment & Plan:  Continues to use cpap consistently and is continuing to benefit from it's use.     Osteoarthritis of knee 08/12/2014   Overview:  Right knee is worse now  Last Assessment & Plan:  Diffuse pain on back in hips and knees also. Went to a pain clinic  Overview:  Overview:  Right knee is worse now  Last Assessment & Plan:  Diffuse pain on back in hips and knees also. Went to a pain clinic    Prostate cancer (HCC) 2019   Rad Tx's.    RA (rheumatoid arthritis) (HCC)    Spinal stenosis of lumbar region with neurogenic claudication 04/18/2016   Spondylosis without myelopathy or radiculopathy, lumbar region 01/22/2017   Venous insufficiency of both lower extremities 11/07/2016    Past Surgical History: Past Surgical History:  Procedure Laterality Date   CARDIAC CATHETERIZATION     CHOLECYSTECTOMY     COLONOSCOPY     COLONOSCOPY WITH PROPOFOL  N/A 01/09/2018   Procedure: COLONOSCOPY WITH PROPOFOL ;  Surgeon: Toledo, Ladell POUR, MD;  Location: [facility];  Service: Gastroenterology;  Laterality: N/A;   deviated septum repair     ESOPHAGOGASTRODUODENOSCOPY (EGD) WITH PROPOFOL  N/A 01/09/2018   Procedure: ESOPHAGOGASTRODUODENOSCOPY (EGD) WITH PROPOFOL ;  Surgeon: Toledo, Ladell POUR, MD;  Location: [facility];  Service: Gastroenterology;  Laterality: N/A;   IR KYPHO LUMBAR INC FX REDUCE BONE BX UNI/BIL CANNULATION INC/IMAGING  03/11/2024   IR RADIOLOGIST EVAL & MGMT  02/28/2024   IR RADIOLOGIST EVAL & MGMT  03/31/2024   KNEE SURGERY Left    NASAL SEPTUM SURGERY       Allergies: Allergies as of 04/14/2024 - Review Complete 04/14/2024  Allergen Reaction Noted   Niacin-lovastatin er Other (See Comments) 01/24/2017   Atorvastatin Other (See Comments) 02/24/2015   Simvastatin Other (See Comments) 03/04/2014   Propoxyphene Nausea Only 09/22/2013    Medications: Current Medications[1]  Social History: Social History[2]  Family Medical History: Family History  Problem Relation Age of Onset   Heart disease Mother     Physical Examination: Vitals:   04/14/24 1122  BP: 132/86    General: Patient is in no apparent distress. Attention to examination is appropriate.  Neck:   Supple.  Full range of motion.  Respiratory: Patient is breathing without any difficulty.   NEUROLOGICAL:     Awake, alert, oriented to person, place, and time.  Speech is clear and fluent.   Cranial Nerves: Pupils equal round and reactive to light.  Facial tone is symmetric.  Facial sensation is symmetric. Shoulder shrug is symmetric. Tongue protrusion is midline.    Strength:  Side Iliopsoas Quads Hamstring PF DF EHL  R 5 5 5 5  5  5  L 5 5 5 5 5 5    Reflexes are 2+ and symmetric at the biceps, triceps, brachioradialis, patella and achilles, slight decrease in the medial hamstrings noted   Hoffman's is absent. Clonus is absent  Decreased in the right L5 distribution      Imaging: Narrative & Impression  EXAM: MRI LUMBAR SPINE 02/28/2024 04:01:17 PM   TECHNIQUE: Multiplanar multisequence MRI of the lumbar spine was performed without the administration of intravenous contrast.   COMPARISON: MRI lumbar spine 12/28/2022. CT abdomen and pelvis 02/15/2024.   CLINICAL HISTORY: Low back pain radiating down the left leg.   FINDINGS:   BONES AND ALIGNMENT: 5 lumbar type vertebrae. Grade 1 retrolisthesis of L1 on L2 and L2 on L3 and grade 1 anterolisthesis of L4 on L5. Lumbar levoscoliosis. L5 compression fracture with 45% vertebral body height loss,  progressed from the recent prior CT. Marrow edema throughout the L5 vertebral body extending into the right sided posterior elements. Mild marrow edema along the S1 superior endplate without a visible discrete fracture. Likely degenerative right sided facet edema at L4-L5 and L5-S1. No suspicious marrow lesion. Modic type 2 endplate changes from T12 to L3. Diffusely increased bone marrow T1 signal intensity throughout the included pelvis likely related to history of radiation therapy for prostate cancer.   SPINAL CORD: The conus medullaris terminates at L1-L2 and is normal in signal.   SOFT TISSUES: No paraspinal mass.   Disc Levels: Disc desiccation and disc space narrowing throughout the lumbar spine including severe narrowing at L1-L2 and L2-L3.   T11-T12: Mild disc bulging and moderate facet hypertrophy without significant stenosis, unchanged.   T12-L1: Mild disc bulging and mild to moderate facet hypertrophy without stenosis, unchanged.   L1-L2: Circumferential disc bulging, prominent epidural fat, and severe facet and ligamentum flavum hypertrophy result in moderate spinal stenosis, mild right and moderate left lateral recess stenosis, and moderate right and mild left neural foraminal stenosis, unchanged.   L2-L3: Circumferential disc bulging, a left paracentral disc osteophyte complex, and severe facet and ligamentum flavum hypertrophy result in moderate spinal stenosis, moderate right and severe left lateral recess stenosis, and moderate to severe right and mild to moderate left neural foraminal stenosis, unchanged.   L3-L4: Circumferential disc bulging, a right subarticular to right extraforaminal disc protrusion, and severe facet and ligamentum flavum hypertrophy result in moderate to severe spinal stenosis, severe right and mild to moderate left lateral recess stenosis, and severe right and mild left neural foraminal stenosis, unchanged.    L4-L5: Anterolisthesis with bulging of uncovered disc eccentric to the left and severe facet and ligamentum flavum hypertrophy result in severe spinal stenosis and mild right and moderate left neural foraminal stenosis, unchanged.   L5-S1: Disc bulging, a new left foraminal to extraforaminal disc protrusion, and severe facet and ligamentum flavum hypertrophy result in mild spinal stenosis, mild right and moderate left lateral recess stenosis, and mild right and severe left neural foraminal stenosis, progressed from prior and with potential left L5 nerve root impingement.   IMPRESSION: 1. Acute/subacute L5 compression fracture with progressive 45% vertebral body height loss. 2. Severe left neural foraminal stenosis at L5-S1 due to a new disc protrusion. 3. Unchanged multilevel spinal stenosis, most severe at L4-L5.   Electronically signed by: Dasie Hamburg MD 02/28/2024 04:26 PM EST RP Workstation: [id number]   Narrative & Impression  EXAM: CT OF THE LUMBAR SPINE WITHOUT CONTRAST 03/31/2024 02:56:50 PM   TECHNIQUE: CT of the lumbar spine was performed without  the administration of intravenous contrast. Multiplanar reformatted images are provided for review. Automated exposure control, iterative reconstruction, and/or weight based adjustment of the mA/kV was utilized to reduce the radiation dose to as low as reasonably achievable.   COMPARISON: MRI lumbar spine 02/28/2024. CT abdomen and pelvis 02/15/2024.   CLINICAL HISTORY: L5 compression fracture status post kyphoplasty on 03/11/2024, initially improved but now with new symptoms.   FINDINGS:   BONES AND ALIGNMENT: 5 lumbar vertebrae. Mild lumbar levoscoliosis. Unchanged grade 1 retrolisthesis of L1 on L2 and L2 on L3 and grade 1 anterolisthesis of L4 on L5. Interval augmentation of the previously described L5 compression fracture with similar degree of height loss compared with the MRI. Of note, there is  a vertical/sagittally oriented fracture component through the midline of the L5 vertebral body extending to the posterior cortex. L5 inferior endplate retropulsion measures 4 mm. A mildly displaced fracture of an anterior osteophyte along the S1 superior endplate on the right is new from the prior CT. Marrow edema and cortical irregularity were present in this region on the interval MRI, although a displaced fracture was not apparent at that time. Other lumbar vertebral body heights are preserved. No suspicious bone lesion is identified. Multilevel chronic degenerative endplate changes and vacuum disc phenomenon are noted.   DEGENERATIVE CHANGES: Widespread disc and facet degeneration throughout the lumbar spine is similar to the prior MRI with advanced multilevel spinal and neural foraminal stenosis and with detailed level by level description deferred to the prior MRI.   SOFT TISSUES: Abdominal aortic atherosclerosis without aneurysm.   IMPRESSION: 1. Interval augmentation of the previously described L5 fracture with similar degree of height loss compared with the prior MRI. 2. New mildly displaced fracture of an anterior osteophyte along the right S1 superior endplate. 3. Widespread disc and facet degeneration throughout the lumbar spine with advanced multilevel spinal and neural foraminal stenosis, similar to the prior MRI.   Electronically signed by: Dasie Hamburg MD 03/31/2024 04:03 PM EST RP Workstation: [id number]      I have personally reviewed the images and agree with the above interpretation.  Assessment and Plan Assessment & Plan Lumbar spondylosis with right L5 radiculopathy and spinal stenosis Chronic, multilevel degenerative lumbar spondylosis with severe spinal stenosis at L4-L5 resulting in right L5 radiculopathy. Symptoms are functionally limiting, with severe pain radiating from the right hip and buttock down the leg, associated with numbness and paresthesia.  Strength is preserved and there is no bowel or bladder dysfunction. Pain is likely due to nerve impingement at L5, possibly exacerbated by prior vertebral fracture and ongoing degenerative changes. Non-surgical management is preferred at this time given preserved strength and recent kyphoplasty. - Referred to physical therapy for evaluation and management of lumbar radiculopathy and to facilitate insurance approval for further interventions. - Requested right-sided L5 epidural steroid injection from pain management (Dr. Charolotte team). - Provided list of local physical therapy providers for him to contact. - Scheduled follow-up in two months to reassess symptoms and response to interventions. - Advised him to return sooner if symptoms worsen or if directed by physical therapy or pain management.  Sequela of lumbar vertebral fracture status post kyphoplasty Lumbar vertebral fracture treated with kyphoplasty several months ago. Imaging demonstrates appropriate cement placement without evidence of complication or cement extravasation. Current symptoms are not attributable to kyphoplasty failure or cement-related issues. - Reviewed recent CT imaging confirming appropriate kyphoplasty result and absence of acute complications. - Discussed that current symptoms are not attributable to  kyphoplasty failure or cement-related issues.   Penne MICAEL Sharps MD/MSCR Neurosurgery      [1]  Current Outpatient Medications:    acetaminophen  (TYLENOL ) 500 MG tablet, Take as needed by mouth. , Disp: , Rfl:    Albuterol Sulfate 108 (90 Base) MCG/ACT AEPB, Inhale as needed into the lungs. , Disp: , Rfl:    aspirin EC 81 MG tablet, Take 1 tablet by mouth daily., Disp: , Rfl:    cholecalciferol (VITAMIN D) 1000 units tablet, Take by mouth., Disp: , Rfl:    clobetasol ointment (TEMOVATE) 0.05 %, Apply 1 application topically 2 (two) times daily., Disp: , Rfl:    clotrimazole-betamethasone  (LOTRISONE) cream, Apply 1  application topically 2 (two) times daily., Disp: , Rfl:    diclofenac sodium (VOLTAREN) 1 % GEL, Apply topically 4 (four) times daily., Disp: , Rfl:    DULoxetine  (CYMBALTA ) 60 MG capsule, Take 1 capsule (60 mg total) by mouth daily., Disp: 30 capsule, Rfl: 11   esomeprazole (NEXIUM) 40 MG capsule, Take 40 mg by mouth daily., Disp: , Rfl:    ezetimibe (ZETIA) 10 MG tablet, Take 10 mg by mouth daily. , Disp: , Rfl:    fenofibrate (TRICOR) 48 MG tablet, Take 48 mg by mouth daily., Disp: , Rfl:    finasteride (PROSCAR) 5 MG tablet, Take 1 tablet by mouth daily., Disp: , Rfl:    fluticasone-salmeterol (ADVAIR) 100-50 MCG/ACT AEPB, Inhale into the lungs., Disp: , Rfl:    hydrochlorothiazide (HYDRODIURIL) 12.5 MG tablet, Take 12.5 mg by mouth daily., Disp: , Rfl:    Multiple Vitamin (MULTIVITAMIN) tablet, Take 1 tablet by mouth daily., Disp: , Rfl:    naproxen sodium (ALEVE) 220 MG tablet, Take 220 mg by mouth 2 (two) times daily as needed., Disp: , Rfl:    NEXLIZET 180-10 MG TABS, Take 1 tablet by mouth daily., Disp: , Rfl:    oxyCODONE -acetaminophen  (PERCOCET) 10-325 MG tablet, Take 1 tablet by mouth every 8 (eight) hours as needed for pain. Must last 30 days., Disp: 90 tablet, Rfl: 0   [START ON 04/30/2024] oxyCODONE -acetaminophen  (PERCOCET) 10-325 MG tablet, Take 1 tablet by mouth every 8 (eight) hours as needed for pain. Must last 30 days., Disp: 90 tablet, Rfl: 0   potassium chloride SA (KLOR-CON M) 20 MEQ tablet, Take 20 mEq by mouth daily., Disp: , Rfl:    pregabalin  (LYRICA ) 75 MG capsule, Take 1 capsule (75 mg total) by mouth at bedtime as needed., Disp: 90 capsule, Rfl: 2   sildenafil  (REVATIO ) 20 MG tablet, Take 3 to 5 tablets two hours before intercouse on an empty stomach.  Do not take with nitrates., Disp: 50 tablet, Rfl: 3   tamsulosin  (FLOMAX ) 0.4 MG CAPS capsule, TAKE 1 CAPSULE EVERY DAY AFTER SUPPER, Disp: 90 capsule, Rfl: 3   tamsulosin  (FLOMAX ) 0.4 MG CAPS capsule, Take 1 capsule  (0.4 mg total) by mouth daily after supper., Disp: 90 capsule, Rfl: 3  Current Facility-Administered Medications:    leuprolide  (6 Month) (ELIGARD ) injection 45 mg, 45 mg, Subcutaneous, Once, Stoioff, Scott C, MD [2]  Social History Tobacco Use   Smoking status: Former    Current packs/day: 0.00    Average packs/day: 1.5 packs/day for 45.0 years (67.5 ttl pk-yrs)    Types: Cigarettes    Start date: 23    Quit date: 2014    Years since quitting: 12.0   Smokeless tobacco: Never  Vaping Use   Vaping status: Never Used  Substance Use Topics  Alcohol use: No   Drug use: No   "

## 2024-04-15 ENCOUNTER — Ambulatory Visit: Attending: Nurse Practitioner | Admitting: Nurse Practitioner

## 2024-04-15 ENCOUNTER — Encounter: Payer: Self-pay | Admitting: Nurse Practitioner

## 2024-04-15 VITALS — BP 127/86 | HR 43 | Temp 97.3°F | Resp 20 | Ht 65.0 in | Wt 180.0 lb

## 2024-04-15 DIAGNOSIS — M4726 Other spondylosis with radiculopathy, lumbar region: Secondary | ICD-10-CM | POA: Insufficient documentation

## 2024-04-15 DIAGNOSIS — G8929 Other chronic pain: Secondary | ICD-10-CM | POA: Insufficient documentation

## 2024-04-15 DIAGNOSIS — M5416 Radiculopathy, lumbar region: Secondary | ICD-10-CM | POA: Diagnosis present

## 2024-04-15 DIAGNOSIS — G894 Chronic pain syndrome: Secondary | ICD-10-CM | POA: Insufficient documentation

## 2024-04-15 DIAGNOSIS — M47816 Spondylosis without myelopathy or radiculopathy, lumbar region: Secondary | ICD-10-CM | POA: Insufficient documentation

## 2024-04-15 DIAGNOSIS — Z87891 Personal history of nicotine dependence: Secondary | ICD-10-CM | POA: Insufficient documentation

## 2024-04-15 DIAGNOSIS — M48061 Spinal stenosis, lumbar region without neurogenic claudication: Secondary | ICD-10-CM | POA: Insufficient documentation

## 2024-04-15 DIAGNOSIS — I2510 Atherosclerotic heart disease of native coronary artery without angina pectoris: Secondary | ICD-10-CM | POA: Insufficient documentation

## 2024-04-15 DIAGNOSIS — E119 Type 2 diabetes mellitus without complications: Secondary | ICD-10-CM | POA: Insufficient documentation

## 2024-04-15 DIAGNOSIS — I10 Essential (primary) hypertension: Secondary | ICD-10-CM | POA: Insufficient documentation

## 2024-04-15 DIAGNOSIS — Z794 Long term (current) use of insulin: Secondary | ICD-10-CM | POA: Insufficient documentation

## 2024-04-15 DIAGNOSIS — M533 Sacrococcygeal disorders, not elsewhere classified: Secondary | ICD-10-CM | POA: Insufficient documentation

## 2024-04-15 DIAGNOSIS — G4733 Obstructive sleep apnea (adult) (pediatric): Secondary | ICD-10-CM | POA: Insufficient documentation

## 2024-04-15 DIAGNOSIS — M5116 Intervertebral disc disorders with radiculopathy, lumbar region: Secondary | ICD-10-CM | POA: Insufficient documentation

## 2024-04-15 NOTE — Patient Instructions (Signed)
Epidural Steroid Injection ?Patient Information ? ?Description: The epidural space surrounds the nerves as they exit the spinal cord.  In some patients, the nerves can be compressed and inflamed by a bulging disc or a tight spinal canal (spinal stenosis).  By injecting steroids into the epidural space, we can bring irritated nerves into direct contact with a potentially helpful medication.  These steroids act directly on the irritated nerves and can reduce swelling and inflammation which often leads to decreased pain.  Epidural steroids may be injected anywhere along the spine and from the neck to the low back depending upon the location of your pain. ?  After numbing the skin with local anesthetic (like Novocaine), a small needle is passed into the epidural space slowly.  You may experience a sensation of pressure while this is being done.  The entire block usually last less than 10 minutes. ? ?Conditions which may be treated by epidural steroids: ? ?Low back and leg pain ?Neck and arm pain ?Spinal stenosis ?Post-laminectomy syndrome ?Herpes zoster (shingles) pain ?Pain from compression fractures ? ?Preparation for the injection: ? ?Do not eat any solid food or dairy products within 8 hours of your appointment.  ?You may drink clear liquids up to 3 hours before appointment.  Clear liquids include water, black coffee, juice or soda.  No milk or cream please. ?You may take your regular medication, including pain medications, with a sip of water before your appointment  Diabetics should hold regular insulin (if taken separately) and take 1/2 normal NPH dos the morning of the procedure.  Carry some sugar containing items with you to your appointment. ?A driver must accompany you and be prepared to drive you home after your procedure.  ?Bring all your current medications with your. ?An IV may be inserted and sedation may be given at the discretion of the physician.   ?A blood pressure cuff, EKG and other monitors will  often be applied during the procedure.  Some patients may need to have extra oxygen administered for a short period. ?You will be asked to provide medical information, including your allergies, prior to the procedure.  We must know immediately if you are taking blood thinners (like Coumadin/Warfarin)  Or if you are allergic to IV iodine contrast (dye). We must know if you could possible be pregnant. ? ?Possible side-effects: ?Bleeding from needle site ?Infection (rare, may require surgery) ?Nerve injury (rare) ?Numbness & tingling (temporary) ?Difficulty urinating (rare, temporary) ?Spinal headache ( a headache worse with upright posture) ?Light -headedness (temporary) ?Pain at injection site (several days) ?Decreased blood pressure (temporary) ?Weakness in arm/leg (temporary) ?Pressure sensation in back/neck (temporary) ? ?Call if you experience: ?Fever/chills associated with headache or increased back/neck pain. ?Headache worsened by an upright position. ?New onset weakness or numbness of an extremity below the injection site ?Hives or difficulty breathing (go to the emergency room) ?Inflammation or drainage at the infection site ?Severe back/neck pain ?Any new symptoms which are concerning to you ? ?Please note: ? ?Although the local anesthetic injected can often make your back or neck feel good for several hours after the injection, the pain will likely return.  It takes 3-7 days for steroids to work in the epidural space.  You may not notice any pain relief for at least that one week. ? ?If effective, we will often do a series of three injections spaced 3-6 weeks apart to maximally decrease your pain.  After the initial series, we generally will wait several months before   considering a repeat injection of the same type. ? ?If you have any questions, please call [phone] ?[facility] ?

## 2024-04-15 NOTE — Progress Notes (Deleted)
 Nursing Pain Medication Assessment:  Safety precautions to be maintained throughout the outpatient stay will include: orient to surroundings, keep bed in low position, maintain call bell within reach at all times, provide assistance with transfer out of bed and ambulation.  Medication Inspection Compliance: Gary [name] did not comply with our request to bring his pills to be counted. He was reminded that bringing the medication bottles, even when empty, is a requirement.  Medication: None brought in. Pill/Patch Count: None available to be counted. Bottle Appearance: No container available. Did not bring bottle(s) to appointment. Filled Date: N/A Last Medication intake:  {Blank single:19197::Ran out of medicine more than 48 hours ago,Day before yesterday,Yesterday,Today}

## 2024-04-15 NOTE — Progress Notes (Signed)
 PROVIDER NOTE: Interpretation of information contained herein should be left to medically-trained personnel. Specific patient instructions are provided elsewhere under Patient Instructions section of medical record. This document was created in part using AI and STT-dictation technology, any transcriptional errors that may result from this process are unintentional.  Patient: Gary [name]  Service: E/M   PCP: Lenon Layman ORN, MD  DOB: 1950-07-12  DOS: 04/15/2024  Provider: Emmy MARLA Blanch, NP  MRN: [id number]  Delivery: Face-to-face  Specialty: Interventional Pain Management  Type: Established Patient  Setting: Ambulatory outpatient facility  Specialty designation: 09  Referring Prov.: Lenon Layman ORN, MD  Location: Outpatient office facility       History of present illness (HPI) [name], a 74 y.o. year old male, is here today because of his Chronic radicular lumbar pain [M54.16, G89.29]. Gary [name] primary complain today is Back Pain  Pertinent problems: Gary [name] has Lumbar spondylosis; Lumbar degenerative disc disease; Degenerative disc disease, cervical; Chronic pain syndrome; Facet arthropathy, lumbar; Benign essential HTN; Chronic osteoarthritis; Coronary artery disease; Localized, primary osteoarthritis; Low back pain; Neck pain; OSA (obstructive sleep apnea); Spinal stenosis of lumbar region with neurogenic claudication; Venous insufficiency of both lower extremities; DDD (degenerative disc disease), cervical; Other intervertebral disc degeneration, lumbar region; Chronic bilateral low back pain with left-sided sciatica; Chronic left sacroiliac joint pain; Chronic hip pain, left; Cervical spondylosis with radiculopathy; Prostate cancer (HCC); Sacroiliac joint pain; Groin pain, chronic, right; Personal history of tobacco use, presenting hazards to health; Chronic left shoulder pain; Controlled type 2 diabetes mellitus with complication, without long-term current use  of insulin (HCC); Rotator cuff tendinitis, left; Tendinitis of upper biceps tendon of left shoulder; SI joint arthritis; Chronic radicular lumbar pain; Neuroforaminal stenosis of lumbar spine; and Primary osteoarthritis of left hip on their pertinent problem list.  Pain Assessment: Severity of Chronic pain is reported as a 10-Worst pain ever/10. Location: Back Lower/Down into hip and bilateral legs. Onset: More than a month ago. Quality: Constant, Aching. Timing: Constant. Modifying factor(s): Denies. Vitals:  height is 5' 5 (1.651 m) and weight is 180 lb (81.6 kg). His temporal temperature is 97.3 F (36.3 C) (abnormal). His blood pressure is 127/86 and his pulse is 43 (abnormal). His respiration is 20 and oxygen saturation is 97%.  BMI: Estimated body mass index is 29.95 kg/m as calculated from the following:   Height as of this encounter: 5' 5 (1.651 m).   Weight as of this encounter: 180 lb (81.6 kg).  Last encounter: 02/04/2024. Last procedure: Visit date not found.  Reason for encounter: evaluation for possible interventional PM therapy/treatment.  The patient continues experiencing lower back pain that is severe enough to hinder his mobility and making it difficult to walk or perform daily activities.  We discussed a transforaminal epidural injection at L4-L5 for his low back pain.   Discussed the use of AI scribe software for clinical note transcription with the patient, who gave verbal consent to proceed.  History of Present Illness   Gary [name] is a 74 year old male who presents for a transferaminal epidural injection at L4, L5. He was referred by Dr. Claudene for management of his back pain.  He experiences excruciating pain originating in the lower back, specifically at the L4-L5 region, radiating to the hip, groin, and down to the top of the foot and toe. The pain is severe enough to hinder mobility, making it difficult to walk or perform daily activities without pain  medication. He uses a walker with a carry tray to assist with mobility and daily activities. The pain disrupts his sleep, waking him up at night.  He has previously undergone various injections, including sacroiliac joint injections and intraarticular hip injections, with limited relief. A prior injection on his left side provided only a couple of days of relief.     Pharmacotherapy Assessment   Monitoring: [place] PMP: PDMP not reviewed this encounter.       Pharmacotherapy: No side-effects or adverse reactions reported. Compliance: No problems identified. Effectiveness: Clinically acceptable.  No notes on file  UDS:  Summary  Date Value Ref Range Status  10/31/2023 FINAL  Final    Comment:    ==================================================================== [employer] [facility] (MW) ==================================================================== Test                             Result       Flag       Units  Drug Present   Oxycodone                       1623                    ng/mg creat   Oxymorphone                    174                     ng/mg creat   Noroxycodone                   1528                    ng/mg creat   Noroxymorphone                 275                     ng/mg creat    Sources of oxycodone  are scheduled prescription medications.    Oxymorphone, noroxycodone, and noroxymorphone are expected    metabolites of oxycodone . Oxymorphone is also available as a    scheduled prescription medication.  ==================================================================== Test                      Result    Flag   Units      Ref Range   Creatinine              142              mg/dL      >=79 ==================================================================== Declared Medications:  Medication list was not provided. ==================================================================== For clinical consultation, please call [phone]. ====================================================================     No results found for: CBDTHCR No results found for: D8THCCBX No results found for: D9THCCBX  ROS  Constitutional: Denies any fever or chills Gastrointestinal: No reported hemesis, hematochezia, vomiting, or acute GI distress Musculoskeletal: Low back pain Neurological: No reported episodes of acute onset apraxia, aphasia, dysarthria, agnosia, amnesia, paralysis, loss of coordination, or loss of consciousness  Medication Review  Albuterol Sulfate, Bempedoic Acid-Ezetimibe, DULoxetine , acetaminophen , aspirin EC, cholecalciferol, clobetasol ointment, clotrimazole-betamethasone , diclofenac sodium, esomeprazole, ezetimibe, fenofibrate, finasteride, fluticasone-salmeterol, hydrochlorothiazide, multivitamin, naproxen sodium, oxyCODONE -acetaminophen , potassium chloride SA, pregabalin , sildenafil , and tamsulosin   History Review  Allergy: Gary [name] is allergic to niacin-lovastatin er, atorvastatin, simvastatin, and propoxyphene. Drug: Gary [name]  reports no history of drug use. Alcohol:  reports no history of alcohol use. Tobacco:  reports that he quit smoking about 12 years ago. His smoking use included cigarettes. He started smoking about 57 years ago. He has a 67.5 pack-year smoking history. He has never used smokeless tobacco. Social: Gary [name]  reports that he quit smoking about 12 years ago. His smoking use included cigarettes. He started smoking about 57 years ago. He has a 67.5 pack-year smoking history. He has never used smokeless tobacco. He reports that he does not drink alcohol and does not use drugs. Medical:  has a past medical history of Anxiety, Asthma, Benign essential HTN (06/24/2015), BPH (benign prostatic hyperplasia), COPD (chronic obstructive pulmonary disease) (HCC), Coronary artery disease (02/16/2014), DDD (degenerative disc disease), cervical, Degenerative disc disease, cervical  (01/22/2017), Depression, DJD (degenerative joint disease), Emphysema of lung (HCC), Heel pain (10/10/2017), Hyperglobulinemia, Lumbar degenerative disc disease (01/22/2017), Major depression in remission (03/10/2014), Migraines, Mitral regurgitation, Mixed hyperlipidemia (02/16/2014), Neck pain (01/24/2017), OSA (obstructive sleep apnea) (07/05/2016), Osteoarthritis of knee (08/12/2014), Prostate cancer (HCC) (2019), RA (rheumatoid arthritis) (HCC), Spinal stenosis of lumbar region with neurogenic claudication (04/18/2016), Spondylosis without myelopathy or radiculopathy, lumbar region (01/22/2017), and Venous insufficiency of both lower extremities (11/07/2016). Surgical: Gary [name]  has a past surgical history that includes Cholecystectomy; Knee surgery (Left); deviated septum repair; Cardiac catheterization; Colonoscopy; Nasal septum surgery; Colonoscopy with propofol  (N/A, 01/09/2018); Esophagogastroduodenoscopy (egd) with propofol  (N/A, 01/09/2018); IR Radiologist Eval & Mgmt (02/28/2024); IR KYPHO LUMBAR INC FX REDUCE BONE BX UNI/BIL CANNULATION INC/IMAGING (03/11/2024); and IR Radiologist Eval & Mgmt (03/31/2024). Family: family history includes Heart disease in his mother.  Laboratory Chemistry Profile   Renal Lab Results  Component Value Date   BUN 29 (H) 03/11/2024   CREATININE 0.83 03/11/2024   GFRNONAA >60 03/11/2024    Hepatic No results found for: AST, ALT, ALBUMIN, ALKPHOS, HCVAB, AMYLASE, LIPASE, AMMONIA  Electrolytes Lab Results  Component Value Date   NA 141 03/11/2024   K 4.0 03/11/2024   CL 105 03/11/2024   CALCIUM 9.7 03/11/2024    Bone Lab Results  Component Value Date   TESTOSTERONE  <3 (L) 04/29/2018    Inflammation (CRP: Acute Phase) (ESR: Chronic Phase) No results found for: CRP, ESRSEDRATE, LATICACIDVEN       Note: Above Lab results reviewed.  Recent Imaging Review  IR Radiologist Eval & Mgmt EXAM: ESTABLISHED PATIENT OFFICE VISIT  CHIEF  COMPLAINT: See below  HISTORY OF PRESENT ILLNESS: See below  REVIEW OF SYSTEMS: See below  PHYSICAL EXAMINATION: See below  ASSESSMENT AND PLAN: Please refer to completed note in the electronic medical record on [place] [facility]  Thom Hall, MD  [facility]  [facility]  Electronically Signed   By: Thom Hall M.D.   On: 03/31/2024 16:29 CT LUMBAR SPINE WO CONTRAST EXAM: CT OF THE LUMBAR SPINE WITHOUT CONTRAST 03/31/2024 02:56:50 PM  TECHNIQUE: CT of the lumbar spine was performed without the administration of intravenous contrast. Multiplanar reformatted images are provided for review. Automated exposure control, iterative reconstruction, and/or weight based adjustment of the mA/kV was utilized to reduce the radiation dose to as low as reasonably achievable.  COMPARISON: MRI lumbar spine 02/28/2024. CT abdomen and pelvis 02/15/2024.  CLINICAL HISTORY: L5 compression fracture status post kyphoplasty on 03/11/2024, initially improved but now with new symptoms.  FINDINGS:  BONES AND ALIGNMENT: 5 lumbar vertebrae. Mild lumbar levoscoliosis. Unchanged grade 1 retrolisthesis of L1 on L2 and L2 on  L3 and grade 1 anterolisthesis of L4 on L5. Interval augmentation of the previously described L5 compression fracture with similar degree of height loss compared with the MRI. Of note, there is a vertical/sagittally oriented fracture component through the midline of the L5 vertebral body extending to the posterior cortex. L5 inferior endplate retropulsion measures 4 mm. A mildly displaced fracture of an anterior osteophyte along the S1 superior endplate on the right is new from the prior CT. Marrow edema and cortical irregularity were present in this region on the interval MRI, although a displaced fracture was not apparent at that time. Other lumbar vertebral body heights are preserved. No suspicious bone lesion  is identified. Multilevel chronic degenerative endplate changes and vacuum disc phenomenon are noted.  DEGENERATIVE CHANGES: Widespread disc and facet degeneration throughout the lumbar spine is similar to the prior MRI with advanced multilevel spinal and neural foraminal stenosis and with detailed level by level description deferred to the prior MRI.  SOFT TISSUES: Abdominal aortic atherosclerosis without aneurysm.  IMPRESSION: 1. Interval augmentation of the previously described L5 fracture with similar degree of height loss compared with the prior MRI. 2. New mildly displaced fracture of an anterior osteophyte along the right S1 superior endplate. 3. Widespread disc and facet degeneration throughout the lumbar spine with advanced multilevel spinal and neural foraminal stenosis, similar to the prior MRI.  Electronically signed by: Dasie Hamburg MD 03/31/2024 04:03 PM EST RP Workstation: [id number] Note: Reviewed        Physical Exam  Vitals: BP 127/86 (BP Location: Right Arm, Patient Position: Sitting, Cuff Size: Normal)   Pulse (!) 43   Temp (!) 97.3 F (36.3 C) (Temporal)   Resp 20   Ht 5' 5 (1.651 m)   Wt 180 lb (81.6 kg)   SpO2 97%   BMI 29.95 kg/m  BMI: Estimated body mass index is 29.95 kg/m as calculated from the following:   Height as of this encounter: 5' 5 (1.651 m).   Weight as of this encounter: 180 lb (81.6 kg). Ideal: Ideal body weight: 61.5 kg (135 lb 9.3 oz) Adjusted ideal body weight: 69.6 kg (153 lb 5.6 oz) General appearance: Well nourished, well developed, and well hydrated. In no apparent acute distress Mental status: Alert, oriented x 3 (person, place, & time)       Respiratory: No evidence of acute respiratory distress Eyes: PERLA  Musculoskeletal: +LBP Lumbar Exam  Skin & Axial Inspection: No masses, redness, or swelling Alignment: Symmetrical Functional ROM: Pain restricted ROM affecting primarily the left Stability: No instability  detected Muscle Tone/Strength: Functionally intact. No obvious neuro-muscular anomalies detected. Sensory (Neurological): Referred pain pattern Palpation: No palpable anomalies       Provocative Tests: Hyperextension/rotation test: deferred today       Lumbar quadrant test (Kemp's test): deferred today       Lateral bending test: deferred today       Patrick's Maneuver: (+) for left-sided S-I arthralgia             FABER* test: (+) for left-sided S-I arthralgia  S-I anterior distraction/compression test: (+) Left-sided S-I arthralgia/arthropathy S-I lateral compression test: Positive for Left-sided S-I arthralgia/arthropathy S-I Thigh-thrust test: (+) Left-sided S-I arthralgia/arthropathy S-I Gaenslen's test: Positive for Left-sided S-I arthralgia/arthropathy  *(Flexion, ABduction and External Rotation) Assessment   Diagnosis Status  1. Chronic radicular lumbar pain   2. Neuroforaminal stenosis of lumbar spine   3. Sacroiliac joint pain (LEFT)   4. Chronic pain syndrome   5.  Lumbar spondylosis    Persistent Persistent Controlled   Updated Problems: No problems updated.  Plan of Care  Problem-specific:  Assessment and Plan    Chronic lumbar radiculopathy Severe pain at L4-L5 with radiation, limited relief from prior injections, significant functional impairment. - Scheduled transforaminal epidural steroid injection at L4-L5 bilaterally with sedation. - Coordinated with Doctor Latiff for procedure scheduling. - Ensured he has transportation post-procedure due to sedation.  Sacroiliac joint pain  Chronic pain syndrome Excruciating pain affecting daily activities and sleep, managed with medication for mobility and function. - Scheduled follow-up appointment on January 26th for medication management.   Plan: (Clinic): (B/L) L-TFESI # 1 with (IV sedation) Dr. Marcelino       Gary [name] has a current medication list which includes the following long-term  medication(s): albuterol sulfate, duloxetine , esomeprazole, ezetimibe, fenofibrate, fluticasone-salmeterol, hydrochlorothiazide, and pregabalin .  Pharmacotherapy (Medications Ordered): No orders of the defined types were placed in this encounter.  Orders:  Orders Placed This Encounter  Procedures   Lumbar Transforaminal Epidural    Standing Status:   Future    Expected Date:   04/29/2024    Expiration Date:   04/15/2025    Scheduling Instructions:     Laterality: Bilateral     Level(s): L4 & L5     Sedation: With Sedation (IV versed )     Timeframe: 2 weeks from now.    Where will this procedure be performed?:   [facility]        Return in about 2 weeks (around 04/29/2024) for [facility]): (B/L) L-TFESI # 1 with (IV sedation) Dr. Marcelino.    Recent Visits Date Type Provider Dept  02/04/24 Office Visit Kimoni Pagliarulo K, NP [facility]  01/24/24 Office Visit Jashiya Bassett K, NP [facility]  Showing recent visits within past 90 days and meeting all other requirements Today's Visits Date Type Provider Dept  04/15/24 Office Visit Johnell Landowski K, NP [facility]  Showing today's visits and meeting all other requirements Future Appointments Date Type Provider Dept  05/05/24 Appointment Ellenora Talton K, NP [facility]  Showing future appointments within next 90 days and meeting all other requirements  I discussed the assessment and treatment plan with the patient. The patient was provided an opportunity to ask questions and all were answered. The patient agreed with the plan and demonstrated an understanding of the instructions.  Patient advised to call back or seek an in-person evaluation if the symptoms or condition worsens.  I personally spent a total of 20 minutes in the care of the patient today including preparing to see the patient, getting/reviewing separately obtained history, performing a medically appropriate exam/evaluation, counseling  and educating, placing orders, referring and communicating with other health care professionals, documenting clinical information in the EHR, independently interpreting results, communicating results, and coordinating care.   Note by: Aren Cherne K Ramil [place], NP (TTS and AI technology used. I apologize for any typographical errors that were not detected and corrected.) Date: 04/15/2024; Time: 4:04 PM

## 2024-04-23 ENCOUNTER — Ambulatory Visit: Admitting: Student in an Organized Health Care Education/Training Program

## 2024-04-23 ENCOUNTER — Encounter: Payer: Self-pay | Admitting: Student in an Organized Health Care Education/Training Program

## 2024-04-23 ENCOUNTER — Ambulatory Visit
Admission: RE | Admit: 2024-04-23 | Discharge: 2024-04-23 | Disposition: A | Discharge status: Home / Self Care | Source: Ambulatory Visit | Attending: Student in an Organized Health Care Education/Training Program | Admitting: Student in an Organized Health Care Education/Training Program

## 2024-04-23 ENCOUNTER — Ambulatory Visit: Admitting: Neurosurgery

## 2024-04-23 DIAGNOSIS — M5416 Radiculopathy, lumbar region: Secondary | ICD-10-CM | POA: Diagnosis not present

## 2024-04-23 DIAGNOSIS — M48061 Spinal stenosis, lumbar region without neurogenic claudication: Secondary | ICD-10-CM | POA: Diagnosis not present

## 2024-04-23 DIAGNOSIS — G8929 Other chronic pain: Secondary | ICD-10-CM | POA: Insufficient documentation

## 2024-04-23 MED ORDER — MIDAZOLAM HCL (PF) 2 MG/2ML IJ SOLN
0.5000 mg | Freq: Once | INTRAMUSCULAR | Status: AC
  Administered 2024-04-23: 2 mg via INTRAVENOUS
  Filled 2024-04-23: qty 2

## 2024-04-23 MED ORDER — LACTATED RINGERS IV SOLN: Freq: Once | INTRAVENOUS | Status: AC

## 2024-04-23 MED ORDER — ROPIVACAINE HCL 2 MG/ML IJ SOLN
2.0000 mL | Freq: Once | INTRAMUSCULAR | Status: AC
  Administered 2024-04-23: 2 mL via EPIDURAL
  Filled 2024-04-23: qty 20

## 2024-04-23 MED ORDER — DEXAMETHASONE SOD PHOSPHATE PF 10 MG/ML IJ SOLN
20.0000 mg | Freq: Once | INTRAMUSCULAR | Status: DC
  Administered 2024-04-23: 20 mg

## 2024-04-23 MED ORDER — DEXAMETHASONE SOD PHOSPHATE PF 10 MG/ML IJ SOLN
20.0000 mg | Freq: Once | INTRAMUSCULAR | Status: DC
  Filled 2024-04-23: qty 2

## 2024-04-23 MED ORDER — SODIUM CHLORIDE 0.9% FLUSH
2.0000 mL | Freq: Once | INTRAVENOUS | Status: AC
  Administered 2024-04-23: 2 mL

## 2024-04-23 MED ORDER — LIDOCAINE HCL 2 % IJ SOLN
20.0000 mL | Freq: Once | INTRAMUSCULAR | Status: AC
  Administered 2024-04-23: 400 mg
  Filled 2024-04-23: qty 20

## 2024-04-23 MED ORDER — IOHEXOL 180 MG/ML  SOLN
10.0000 mL | Freq: Once | INTRAMUSCULAR | Status: AC
  Administered 2024-04-23: 10 mL via EPIDURAL
  Filled 2024-04-23: qty 20

## 2024-04-23 NOTE — Progress Notes (Signed)
 PROVIDER NOTE: Interpretation of information contained herein should be left to medically-trained personnel. Specific patient instructions are provided elsewhere under Patient Instructions section of medical record. This document was created in part using STT-dictation technology, any transcriptional errors that may result from this process are unintentional.  Patient: Gary [name] Type: Established DOB: May 18, 1950 MRN: [id number] PCP: Lenon Layman ORN, MD  Service: Procedure DOS: 04/23/2024 Setting: Ambulatory Location: Ambulatory outpatient facility Delivery: Face-to-face Provider: Wallie Sherry, MD Specialty: Interventional Pain Management Specialty designation: 09 Location: Outpatient facility Ref. Prov.: Patel, Seema K, NP       Interventional Therapy   Procedure: Lumbar trans-foraminal epidural steroid injection (L-TFESI) #1  Laterality: Right (-RT)  Level: L4 and L5 nerve root(s) Imaging: Fluoroscopy-guided         Anesthesia: Local anesthesia (1-2% Lidocaine ) Sedation: Minimal Sedation                       DOS: 04/23/2024  Performed by: Wallie Sherry, MD  Purpose: Diagnostic/Therapeutic Indications: Lumbar radicular pain severe enough to impact quality of life or function. 1. Chronic radicular lumbar pain   2. Neuroforaminal stenosis of lumbar spine    NAS-11 Pain score:   Pre-procedure: 10-Worst pain ever/10   Post-procedure: 8 /10     Position / Prep / Materials:  Position: Prone  Prep solution: ChloraPrep (2% chlorhexidine gluconate and 70% isopropyl alcohol) Prep Area: Entire Posterior Lumbosacral Area.  From the lower tip of the scapula down to the tailbone and from flank to flank. Materials:  Tray: Block Needle(s):  Type: Spinal  Gauge (G): 22  Length: 5-in  Qty: 2     H&P (Pre-op Assessment):  Gary [name] is a 74 y.o. (year old), male patient, seen today for interventional treatment. He  has a past surgical history that includes Cholecystectomy;  Knee surgery (Left); deviated septum repair; Cardiac catheterization; Colonoscopy; Nasal septum surgery; Colonoscopy with propofol  (N/A, 01/09/2018); Esophagogastroduodenoscopy (egd) with propofol  (N/A, 01/09/2018); IR Radiologist Eval & Mgmt (02/28/2024); IR KYPHO LUMBAR INC FX REDUCE BONE BX UNI/BIL CANNULATION INC/IMAGING (03/11/2024); and IR Radiologist Eval & Mgmt (03/31/2024). Gary [name] has a current medication list which includes the following prescription(s): acetaminophen , albuterol sulfate, aspirin ec, cholecalciferol, clobetasol ointment, clotrimazole-betamethasone , diclofenac sodium, duloxetine , esomeprazole, ezetimibe, fenofibrate, finasteride, fluticasone-salmeterol, hydrochlorothiazide, multivitamin, naproxen sodium, nexlizet, oxycodone -acetaminophen , [START ON 04/30/2024] oxycodone -acetaminophen , potassium chloride sa, pregabalin , sildenafil , tamsulosin , and tamsulosin , and the following Facility-Administered Medications: dexamethasone , lactated ringers , and leuprolide  (6 month). His primarily concern today is the Back Pain (lower)  Initial Vital Signs:  Pulse/HCG Rate: (!) 48ECG Heart Rate: (!) 46 Temp: (!) 97.2 F (36.2 C) Resp: 18 BP: (!) 116/91 SpO2: 95 %  BMI: Estimated body mass index is 29.95 kg/m as calculated from the following:   Height as of this encounter: 5' 5 (1.651 m).   Weight as of this encounter: 180 lb (81.6 kg).  Risk Assessment: Allergies: Reviewed. He is allergic to niacin-lovastatin er, atorvastatin, simvastatin, and propoxyphene.  Allergy Precautions: None required Coagulopathies: Reviewed. None identified.  Blood-thinner therapy: None at this time Active Infection(s): Reviewed. None identified. Gary [name] is afebrile  Site Confirmation: Gary [name] was asked to confirm the procedure and laterality before marking the site Procedure checklist: Completed Consent: Before the procedure and under the influence of no sedative(s), amnesic(s), or anxiolytics,  the patient was informed of the treatment options, risks and possible complications. To fulfill our ethical and legal obligations, as recommended by the American Medical Association's Code of Ethics, I  have informed the patient of my clinical impression; the nature and purpose of the treatment or procedure; the risks, benefits, and possible complications of the intervention; the alternatives, including doing nothing; the risk(s) and benefit(s) of the alternative treatment(s) or procedure(s); and the risk(s) and benefit(s) of doing nothing. The patient was provided information about the general risks and possible complications associated with the procedure. These may include, but are not limited to: failure to achieve desired goals, infection, bleeding, organ or nerve damage, allergic reactions, paralysis, and death. In addition, the patient was informed of those risks and complications associated to Spine-related procedures, such as failure to decrease pain; infection (i.e.: Meningitis, epidural or intraspinal abscess); bleeding (i.e.: epidural hematoma, subarachnoid hemorrhage, or any other type of intraspinal or peri-dural bleeding); organ or nerve damage (i.e.: Any type of peripheral nerve, nerve root, or spinal cord injury) with subsequent damage to sensory, motor, and/or autonomic systems, resulting in permanent pain, numbness, and/or weakness of one or several areas of the body; allergic reactions; (i.e.: anaphylactic reaction); and/or death. Furthermore, the patient was informed of those risks and complications associated with the medications. These include, but are not limited to: allergic reactions (i.e.: anaphylactic or anaphylactoid reaction(s)); adrenal axis suppression; blood sugar elevation that in diabetics may result in ketoacidosis or comma; water retention that in patients with history of congestive heart failure may result in shortness of breath, pulmonary edema, and decompensation with  resultant heart failure; weight gain; swelling or edema; medication-induced neural toxicity; particulate matter embolism and blood vessel occlusion with resultant organ, and/or nervous system infarction; and/or aseptic necrosis of one or more joints. Finally, the patient was informed that Medicine is not an exact science; therefore, there is also the possibility of unforeseen or unpredictable risks and/or possible complications that may result in a catastrophic outcome. The patient indicated having understood very clearly. We have given the patient no guarantees and we have made no promises. Enough time was given to the patient to ask questions, all of which were answered to the patient's satisfaction. Mr. Millstein has indicated that he wanted to continue with the procedure. Attestation: I, the ordering provider, attest that I have discussed with the patient the benefits, risks, side-effects, alternatives, likelihood of achieving goals, and potential problems during recovery for the procedure that I have provided informed consent. Date  Time: 04/23/2024 11:20 AM  Pre-Procedure Preparation:  Monitoring: As per clinic protocol. Respiration, ETCO2, SpO2, BP, heart rate and rhythm monitor placed and checked for adequate function Safety Precautions: Patient was assessed for positional comfort and pressure points before starting the procedure. Time-out: I initiated and conducted the Time-out before starting the procedure, as per protocol. The patient was asked to participate by confirming the accuracy of the Time Out information. Verification of the correct person, site, and procedure were performed and confirmed by me, the nursing staff, and the patient. Time-out conducted as per Joint Commission's Universal Protocol (UP.01.01.01). Time: 1142 Start Time: 1142 hrs.  Description/Narrative of Procedure:          Target: The 6 o'clock position under the pedicle, on the affected side. Region: Posterolateral  Lumbosacral Approach: Posterior Percutaneous Paravertebral approach.  Rationale (medical necessity): procedure needed and proper for the diagnosis and/or treatment of the patient's medical symptoms and needs. Procedural Technique Safety Precautions: Aspiration looking for blood return was conducted prior to all injections. At no point did we inject any substances, as a needle was being advanced. No attempts were made at seeking any paresthesias. Safe injection  practices and needle disposal techniques used. Medications properly checked for expiration dates. SDV (single dose vial) medications used. Description of the Procedure: Protocol guidelines were followed. The patient was placed in position over the procedure table. The target area was identified and the area prepped in the usual manner. Skin & deeper tissues infiltrated with local anesthetic. Appropriate amount of time allowed to pass for local anesthetics to take effect. The procedure needles were then advanced to the target area. Proper needle placement secured. Negative aspiration confirmed. Solution injected in intermittent fashion, asking for systemic symptoms every 0.5cc of injectate. The needles were then removed and the area cleansed, making sure to leave some of the prepping solution back to take advantage of its long term bactericidal properties.  Vitals:   04/23/24 1140 04/23/24 1145 04/23/24 1150 04/23/24 1205  BP: 125/86 131/82 121/80 116/61  Pulse:      Resp: 15 16 16 16   Temp:    (!) 97.3 F (36.3 C)  TempSrc:    Temporal  SpO2: 97% 98% 97% 96%  Weight:      Height:        Start Time: 1142 hrs. End Time: 1150 hrs.  Imaging Guidance (Spinal):          Type of Imaging Technique: Fluoroscopy Guidance (Spinal) Indication(s): Fluoroscopy guidance for needle placement to enhance accuracy in procedures requiring precise needle localization for targeted delivery of medication in or near specific anatomical locations not easily  accessible without such real-time imaging assistance. Exposure Time: Please see nurses notes. Contrast: Before injecting any contrast, we confirmed that the patient did not have an allergy to iodine, shellfish, or radiological contrast. Once satisfactory needle placement was completed at the desired level, radiological contrast was injected. Contrast injected under live fluoroscopy. No contrast complications. See chart for type and volume of contrast used. Fluoroscopic Guidance: I was personally present during the use of fluoroscopy. Tunnel Vision Technique used to obtain the best possible view of the target area. Parallax error corrected before commencing the procedure. Direction-depth-direction technique used to introduce the needle under continuous pulsed fluoroscopy. Once target was reached, antero-posterior, oblique, and lateral fluoroscopic projection used confirm needle placement in all planes. Images permanently stored in EMR. Interpretation: I personally interpreted the imaging intraoperatively. Adequate needle placement confirmed in multiple planes. Appropriate spread of contrast into desired area was observed. No evidence of afferent or efferent intravascular uptake. No intrathecal or subarachnoid spread observed. Permanent images saved into the patient's record.  Post-operative Assessment:  Post-procedure Vital Signs:  Pulse/HCG Rate: (!) 4886 Temp: (!) 97.3 F (36.3 C) Resp: 16 BP: 116/61 SpO2: 96 %  EBL: None  Complications: No immediate post-treatment complications observed by team, or reported by patient.  Note: The patient tolerated the entire procedure well. A repeat set of vitals were taken after the procedure and the patient was kept under observation following institutional policy, for this type of procedure. Post-procedural neurological assessment was performed, showing return to baseline, prior to discharge. The patient was provided with post-procedure discharge  instructions, including a section on how to identify potential problems. Should any problems arise concerning this procedure, the patient was given instructions to immediately contact us , at any time, without hesitation. In any case, we plan to contact the patient by telephone for a follow-up status report regarding this interventional procedure.  Comments:  No additional relevant information.  Plan of Care (POC)  Orders:  Orders Placed This Encounter  Procedures   DG PAIN CLINIC C-ARM 1-60 MIN  NO REPORT    Intraoperative interpretation by procedural physician at [facility].    Standing Status:   Standing    Number of Occurrences:   1    Reason for exam::   Assistance in needle guidance and placement for procedures requiring needle placement in or near specific anatomical locations not easily accessible without such assistance.     Medications ordered for procedure: Meds ordered this encounter  Medications   iohexol  (OMNIPAQUE ) 180 MG/ML injection 10 mL    Must be Myelogram-compatible. If not available, you may substitute with a water-soluble, non-ionic, hypoallergenic, myelogram-compatible radiological contrast medium.   lidocaine  (XYLOCAINE ) 2 % (with pres) injection 400 mg   lactated ringers  infusion   midazolam  PF (VERSED ) injection 0.5-2 mg    Make sure Flumazenil is available in the pyxis when using this medication. If oversedation occurs, administer 0.2 mg IV over 15 sec. If after 45 sec no response, administer 0.2 mg again over 1 min; may repeat at 1 min intervals; not to exceed 4 doses (1 mg)   dexamethasone  (DECADRON ) injection 20 mg    This is for a two (2) level block. Use two (2) syringes and divide content in half.   ropivacaine  (PF) 2 mg/mL (0.2%) (NAROPIN ) injection 2 mL    This is for a two (2) level block. Use two (2) syringes and divide content in half.   sodium chloride  flush (NS) 0.9 % injection 2 mL    This is for a two (2) level block. Use two (2)  syringes and divide content in half.   DISCONTD: dexamethasone  (DECADRON ) injection 20 mg   Medications administered: We administered iohexol , lidocaine , lactated ringers , midazolam  PF, ropivacaine  (PF) 2 mg/mL (0.2%), sodium chloride  flush, and dexamethasone .  See the medical record for exact dosing, route, and time of administration.   Follow-up plan:   Return for Keep sch. appt.     Recent Visits Date Type Provider Dept  04/15/24 Office Visit Patel, Seema K, NP [facility]  02/04/24 Office Visit Patel, Seema K, NP [facility]  01/24/24 Office Visit Patel, Seema K, NP [facility]  Showing recent visits within past 90 days and meeting all other requirements Today's Visits Date Type Provider Dept  04/23/24 Procedure visit Marcelino Nurse, MD [facility]  Showing today's visits and meeting all other requirements Future Appointments Date Type Provider Dept  05/05/24 Appointment Patel, Seema K, NP [facility]  Showing future appointments within next 90 days and meeting all other requirements   Disposition: Discharge home  Discharge (Date  Time): 04/23/2024; 1207 hrs.   Primary Care Physician: Lenon Layman ORN, MD Location: [facility] Note by: Nurse Marcelino, MD (TTS technology used. I apologize for any typographical errors that were not detected and corrected.) Date: 04/23/2024; Time: 12:24 PM  Disclaimer:  Medicine is not an visual merchandiser. The only guarantee in medicine is that nothing is guaranteed. It is important to note that the decision to proceed with this intervention was based on the information collected from the patient. The Data and conclusions were drawn from the patient's questionnaire, the interview, and the physical examination. Because the information was provided in large part by the patient, it cannot be guaranteed that it has not been purposely or unconsciously manipulated. Every effort  has been made to obtain as much relevant data as possible for this evaluation. It is important to note that the conclusions that lead to this procedure are  derived in large part from the available data. Always take into account that the treatment will also be dependent on availability of resources and existing treatment guidelines, considered by other Pain Management Practitioners as being common knowledge and practice, at the time of the intervention. For Medico-Legal purposes, it is also important to point out that variation in procedural techniques and pharmacological choices are the acceptable norm. The indications, contraindications, technique, and results of the above procedure should only be interpreted and judged by a Board-Certified Interventional Pain Specialist with extensive familiarity and expertise in the same exact procedure and technique.

## 2024-04-23 NOTE — Patient Instructions (Signed)

## 2024-04-24 ENCOUNTER — Telehealth: Payer: Self-pay

## 2024-04-24 NOTE — Telephone Encounter (Signed)
 Post procedure follow up.  Patient states he is doing ok but is still hurting in buttock.  Instructed to document on pain diary and call us  if needed.

## 2024-04-28 ENCOUNTER — Telehealth: Payer: Self-pay | Admitting: Neurosurgery

## 2024-04-28 ENCOUNTER — Telehealth: Payer: Self-pay | Admitting: Student in an Organized Health Care Education/Training Program

## 2024-04-28 DIAGNOSIS — G8929 Other chronic pain: Secondary | ICD-10-CM

## 2024-04-28 MED ORDER — PREDNISONE 20 MG PO TABS: ORAL_TABLET | ORAL | 0 refills | Status: AC

## 2024-04-28 NOTE — Telephone Encounter (Signed)
 Continues to have worsened pain after TFESI.  Has tried his pain meds and muscle relaxers and Tylenol .   He is willing to try Prednisone  Dosepack.

## 2024-04-28 NOTE — Telephone Encounter (Signed)
 Pt stated that the procedure did not work. He is really serve pain from the hip all the way down to his feet. He thinks a nerve has been hit in the process. He never felt so much pain and he need to know what to do because the procedure did not work

## 2024-04-28 NOTE — Telephone Encounter (Signed)
 Patient reports that the injection provided no relief. He has been in contact with Dr. Andrey office to discuss next steps. The patient is expected to start a prednisone  Dosepak but is requesting Dr. Vernon input regarding the best course of treatment, including whether surgical intervention may be necessary.

## 2024-04-28 NOTE — Telephone Encounter (Signed)
 I spoke to patient and informed him that the injections can take a couple weeks to work. He states he has had injections in the past and they have helped within a couple days.He said this injection was the most painful he has ever had. He states he can hardly walk in the morning until he gets pain medication in his system. He will be starting the Prednisone  medication in the morning. He states at night when he is in the bed his right leg get ice cold and around the ankle to the foot and he is concerned about this. He states he had a doppler before Christmas and he was told it was normal. He would like to know what the next steps will be. Would you like to set up a follow up appointment to discuss?

## 2024-04-29 ENCOUNTER — Ambulatory Visit: Admitting: Neurosurgery

## 2024-04-29 DIAGNOSIS — M51361 Other intervertebral disc degeneration, lumbar region with lower extremity pain only: Secondary | ICD-10-CM

## 2024-04-29 NOTE — Telephone Encounter (Signed)
 Spoke appointment is made.

## 2024-04-29 NOTE — Progress Notes (Signed)
 This visit was performed via telephone. Patient consented to go forward with a phone visit.  Patient location: home Provider location: office  Dx for Discussion: Degeneration of intervertebral disc of lumbar region with lower extremity pain  Interval history: Significant pain down the right leg and hip after injection. Severe pain down the leg. In the mornings his pain is out of control, and has escalated his pain medication needs. Right upper lateral buttocks, right leg laterally, past knee.  Just started on a prednisone  taper after the irritation.    Images:  MR LUMBAR SPINE WO CONTRAST Result Date: 02/28/2024 EXAM: MRI LUMBAR SPINE 02/28/2024 04:01:17 PM TECHNIQUE: Multiplanar multisequence MRI of the lumbar spine was performed without the administration of intravenous contrast. COMPARISON: MRI lumbar spine 12/28/2022. CT abdomen and pelvis 02/15/2024. CLINICAL HISTORY: Low back pain radiating down the left leg. FINDINGS: BONES AND ALIGNMENT: 5 lumbar type vertebrae. Grade 1 retrolisthesis of L1 on L2 and L2 on L3 and grade 1 anterolisthesis of L4 on L5. Lumbar levoscoliosis. L5 compression fracture with 45% vertebral body height loss, progressed from the recent prior CT. Marrow edema throughout the L5 vertebral body extending into the right sided posterior elements. Mild marrow edema along the S1 superior endplate without a visible discrete fracture. Likely degenerative right sided facet edema at L4-L5 and L5-S1. No suspicious marrow lesion. Modic type 2 endplate changes from T12 to L3. Diffusely increased bone marrow T1 signal intensity throughout the included pelvis likely related to history of radiation therapy for prostate cancer. SPINAL CORD: The conus medullaris terminates at L1-L2 and is normal in signal. SOFT TISSUES: No paraspinal mass. Disc Levels: Disc desiccation and disc space narrowing throughout the lumbar spine including severe narrowing at L1-L2 and L2-L3. T11-T12: Mild disc  bulging and moderate facet hypertrophy without significant stenosis, unchanged. T12-L1: Mild disc bulging and mild to moderate facet hypertrophy without stenosis, unchanged. L1-L2: Circumferential disc bulging, prominent epidural fat, and severe facet and ligamentum flavum hypertrophy result in moderate spinal stenosis, mild right and moderate left lateral recess stenosis, and moderate right and mild left neural foraminal stenosis, unchanged. L2-L3: Circumferential disc bulging, a left paracentral disc osteophyte complex, and severe facet and ligamentum flavum hypertrophy result in moderate spinal stenosis, moderate right and severe left lateral recess stenosis, and moderate to severe right and mild to moderate left neural foraminal stenosis, unchanged. L3-L4: Circumferential disc bulging, a right subarticular to right extraforaminal disc protrusion, and severe facet and ligamentum flavum hypertrophy result in moderate to severe spinal stenosis, severe right and mild to moderate left lateral recess stenosis, and severe right and mild left neural foraminal stenosis, unchanged. L4-L5: Anterolisthesis with bulging of uncovered disc eccentric to the left and severe facet and ligamentum flavum hypertrophy result in severe spinal stenosis and mild right and moderate left neural foraminal stenosis, unchanged. L5-S1: Disc bulging, a new left foraminal to extraforaminal disc protrusion, and severe facet and ligamentum flavum hypertrophy result in mild spinal stenosis, mild right and moderate left lateral recess stenosis, and mild right and severe left neural foraminal stenosis, progressed from prior and with potential left L5 nerve root impingement. IMPRESSION: 1. Acute/subacute L5 compression fracture with progressive 45% vertebral body height loss. 2. Severe left neural foraminal stenosis at L5-S1 due to a new disc protrusion. 3. Unchanged multilevel spinal stenosis, most severe at L4-L5. Electronically signed by: Dasie Hamburg MD 02/28/2024 04:26 PM EST RP Workstation: [id number]    Assessment and Plan: Very pleasant 74 year old gentleman with significant back and lower  extremity pain.  Following up after a epidural steroid injection.  He felt like he had some worsening of his pain in the L5 distribution after the procedure.  We discussed that this is not uncommon especially in areas of severe tightness and stenosis as we have to introduce of volume of fluid into that small space and that can sometimes temporarily make pressure go up.  We agree with oral steroids to help with the irritation.  He is starting physical therapy on Thursday, I would like to see him on February 2 at 1145 to follow-up on his progress.  I spent a total of 10 minutes non-face-to-face activities for this visit on the date of this encounter including review of current clinical condition and response to treatment.  The patient is aware of and accepts the limits of this telehealth visit.

## 2024-05-05 ENCOUNTER — Encounter: Admitting: Nurse Practitioner

## 2024-05-07 NOTE — Progress Notes (Unsigned)
 PROVIDER NOTE: Interpretation of information contained herein should be left to medically-trained personnel. Specific patient instructions are provided elsewhere under Patient Instructions section of medical record. This document was created in part using AI and STT-dictation technology, any transcriptional errors that may result from this process are unintentional.  Patient: Gary [name]  Service: E/M   PCP: Lenon Layman ORN, MD  DOB: 1950/05/14  DOS: 05/08/2024  Provider: Emmy MARLA Blanch, NP  MRN: [id number]  Delivery: Face-to-face  Specialty: Interventional Pain Management  Type: Established Patient  Setting: Ambulatory outpatient facility  Specialty designation: 09  Referring Prov.: Lenon Layman ORN, MD  Location: Outpatient office facility       History of present illness (HPI) [name], a 74 y.o. year old male, is here today because of his No primary diagnosis found.. [name] primary complain today is No chief complaint on file.  Pertinent problems: Gary [name] has Lumbar spondylosis; Lumbar degenerative disc disease; Degenerative disc disease, cervical; Chronic pain syndrome; Facet arthropathy, lumbar; Benign essential HTN; Chronic osteoarthritis; Coronary artery disease; Localized, primary osteoarthritis; Low back pain; Neck pain; OSA (obstructive sleep apnea); Spinal stenosis of lumbar region with neurogenic claudication; Venous insufficiency of both lower extremities; DDD (degenerative disc disease), cervical; Other intervertebral disc degeneration, lumbar region; Chronic bilateral low back pain with left-sided sciatica; Chronic left sacroiliac joint pain; Chronic hip pain, left; Cervical spondylosis with radiculopathy; Prostate cancer (HCC); Sacroiliac joint pain; Groin pain, chronic, right; Personal history of tobacco use, presenting hazards to health; Chronic left shoulder pain; Controlled type 2 diabetes mellitus with complication, without long-term current use  of insulin (HCC); Rotator cuff tendinitis, left; Tendinitis of upper biceps tendon of left shoulder; SI joint arthritis; Chronic radicular lumbar pain; Neuroforaminal stenosis of lumbar spine; and Primary osteoarthritis of left hip on their pertinent problem list.  Pain Assessment: Severity of   is reported as a  /10. Location:    / . Onset:  . Quality:  . Timing:  . Modifying factor(s):  SABRA Vitals:  vitals were not taken for this visit.  BMI: Estimated body mass index is 29.95 kg/m as calculated from the following:   Height as of 04/23/24: 5' 5 (1.651 m).   Weight as of 04/23/24: 180 lb (81.6 kg).  Last encounter: 04/15/2024. Last procedure: Visit date not found.  Reason for encounter:  *** .   Discussed the use of AI scribe software for clinical note transcription with the patient, who gave verbal consent to proceed.  History of Present Illness          Procedure Procedure: Lumbar trans-foraminal epidural steroid injection (L-TFESI) #1  Laterality: Right (-RT)  Level: L4 and L5 nerve root(s) Imaging: Fluoroscopy-guided         Anesthesia: Local anesthesia (1-2% Lidocaine ) Sedation: Minimal Sedation                       DOS: 04/23/2024  Performed by: Wallie Sherry, MD   Purpose: Diagnostic/Therapeutic Indications: Lumbar radicular pain severe enough to impact quality of life or function. 1. Chronic radicular lumbar pain   2. Neuroforaminal stenosis of lumbar spine     NAS-11 Pain score:        Pre-procedure: 10-Worst pain ever/10        Post-procedure: 8 /10   Post-Procedure Evaluation     Effectiveness:  Initial hour after procedure:   ***. Subsequent 4-6 hours post-procedure:   ***. Analgesia past initial 6 hours:   ***.  Ongoing improvement:  Analgesic:  *** Function: {Blank single:19197::No benefit,No improvement,Back to baseline,Transient improvement,Gary [name] reports improvement in function,Somewhat improved,Minimal improvement,   ***   } ROM:  {Blank single:19197::No benefit,No improvement,Back to baseline,Transient improvement,Gary [name] reports improvement in ROM,Somewhat improved,Minimal improvement,   ***   } Interpretation: ***  Pharmacotherapy Assessment   Monitoring:  PMP: PDMP not reviewed this encounter.       Pharmacotherapy: No side-effects or adverse reactions reported. Compliance: No problems identified. Effectiveness: Clinically acceptable.  No notes on file  UDS:  Summary  Date Value Ref Range Status  10/31/2023 FINAL  Final    Comment:    ==================================================================== [employer] [facility] (MW) ==================================================================== Test                             Result       Flag       Units  Drug Present   Oxycodone                       1623                    ng/mg creat   Oxymorphone                    174                     ng/mg creat   Noroxycodone                   1528                    ng/mg creat   Noroxymorphone                 275                     ng/mg creat    Sources of oxycodone  are scheduled prescription medications.    Oxymorphone, noroxycodone, and noroxymorphone are expected    metabolites of oxycodone . Oxymorphone is also available as a    scheduled prescription medication.  ==================================================================== Test                      Result    Flag   Units      Ref Range   Creatinine              142              mg/dL      >=79 ==================================================================== Declared Medications:  Medication list was not provided. ==================================================================== For clinical consultation, please call [phone]. ====================================================================     No results found for: CBDTHCR No results found for: D8THCCBX No results found for:  D9THCCBX  ROS  Constitutional: Denies any fever or chills Gastrointestinal: No reported hemesis, hematochezia, vomiting, or acute GI distress Musculoskeletal: Denies any acute onset joint swelling, redness, loss of ROM, or weakness Neurological: No reported episodes of acute onset apraxia, aphasia, dysarthria, agnosia, amnesia, paralysis, loss of coordination, or loss of consciousness  Medication Review  Albuterol Sulfate, Bempedoic Acid-Ezetimibe, DULoxetine , acetaminophen , aspirin EC, cholecalciferol, clobetasol ointment, clotrimazole-betamethasone , diclofenac sodium, esomeprazole, ezetimibe, fenofibrate, finasteride, fluticasone-salmeterol, hydrochlorothiazide, multivitamin, naproxen sodium, oxyCODONE -acetaminophen , potassium chloride SA, predniSONE , pregabalin , sildenafil , and tamsulosin   History Review  Allergy: Gary [name] is allergic to niacin-lovastatin er, atorvastatin, simvastatin, and propoxyphene. Drug: Gary [name]  reports  no history of drug use. Alcohol:  reports no history of alcohol use. Tobacco:  reports that he quit smoking about 12 years ago. His smoking use included cigarettes. He started smoking about 57 years ago. He has a 67.5 pack-year smoking history. He has never used smokeless tobacco. Social: Gary [name]  reports that he quit smoking about 12 years ago. His smoking use included cigarettes. He started smoking about 57 years ago. He has a 67.5 pack-year smoking history. He has never used smokeless tobacco. He reports that he does not drink alcohol and does not use drugs. Medical:  has a past medical history of Anxiety, Asthma, Benign essential HTN (06/24/2015), BPH (benign prostatic hyperplasia), COPD (chronic obstructive pulmonary disease) (HCC), Coronary artery disease (02/16/2014), DDD (degenerative disc disease), cervical, Degenerative disc disease, cervical (01/22/2017), Depression, DJD (degenerative joint disease), Emphysema of lung (HCC), Heel pain (10/10/2017),  Hyperglobulinemia, Lumbar degenerative disc disease (01/22/2017), Major depression in remission (03/10/2014), Migraines, Mitral regurgitation, Mixed hyperlipidemia (02/16/2014), Neck pain (01/24/2017), OSA (obstructive sleep apnea) (07/05/2016), Osteoarthritis of knee (08/12/2014), Prostate cancer (HCC) (2019), RA (rheumatoid arthritis) (HCC), Spinal stenosis of lumbar region with neurogenic claudication (04/18/2016), Spondylosis without myelopathy or radiculopathy, lumbar region (01/22/2017), and Venous insufficiency of both lower extremities (11/07/2016). Surgical: Gary [name]  has a past surgical history that includes Cholecystectomy; Knee surgery (Left); deviated septum repair; Cardiac catheterization; Colonoscopy; Nasal septum surgery; Colonoscopy with propofol  (N/A, 01/09/2018); Esophagogastroduodenoscopy (egd) with propofol  (N/A, 01/09/2018); IR Radiologist Eval & Mgmt (02/28/2024); IR KYPHO LUMBAR INC FX REDUCE BONE BX UNI/BIL CANNULATION INC/IMAGING (03/11/2024); and IR Radiologist Eval & Mgmt (03/31/2024). Family: family history includes Heart disease in his mother.  Laboratory Chemistry Profile   Renal Lab Results  Component Value Date   BUN 29 (H) 03/11/2024   CREATININE 0.83 03/11/2024   GFRNONAA >60 03/11/2024    Hepatic No results found for: AST, ALT, ALBUMIN, ALKPHOS, HCVAB, AMYLASE, LIPASE, AMMONIA  Electrolytes Lab Results  Component Value Date   NA 141 03/11/2024   K 4.0 03/11/2024   CL 105 03/11/2024   CALCIUM 9.7 03/11/2024    Bone Lab Results  Component Value Date   TESTOSTERONE  <3 (L) 04/29/2018    Inflammation (CRP: Acute Phase) (ESR: Chronic Phase) No results found for: CRP, ESRSEDRATE, LATICACIDVEN       Note: Above Lab results reviewed.  Recent Imaging Review  DG PAIN CLINIC C-ARM 1-60 MIN NO REPORT Fluoro was used, but no Radiologist interpretation will be provided.  Please refer to NOTES tab for provider progress note. Note: Reviewed         Physical Exam  Vitals: There were no vitals taken for this visit. BMI: Estimated body mass index is 29.95 kg/m as calculated from the following:   Height as of 04/23/24: 5' 5 (1.651 m).   Weight as of 04/23/24: 180 lb (81.6 kg). Ideal: Patient weight not recorded General appearance: Well nourished, well developed, and well hydrated. In no apparent acute distress Mental status: Alert, oriented x 3 (person, place, & time)       Respiratory: No evidence of acute respiratory distress Eyes: PERLA   Assessment   Diagnosis Status  No diagnosis found. Controlled Controlled Controlled   Updated Problems: No problems updated.  Plan of Care  Problem-specific:  Assessment and Plan            Gary [name] has a current medication list which includes the following long-term medication(s): albuterol sulfate, duloxetine , esomeprazole, ezetimibe, fenofibrate, fluticasone-salmeterol, hydrochlorothiazide, and pregabalin .  Pharmacotherapy (  Medications Ordered): No orders of the defined types were placed in this encounter.  Orders:  No orders of the defined types were placed in this encounter.    {There is no content from the last Plan section.}   No follow-ups on file.    Recent Visits Date Type Provider Dept  04/23/24 Procedure visit Marcelino Nurse, MD [facility]  04/15/24 Office Visit Spencer Peterkin K, NP [facility]  Showing recent visits within past 90 days and meeting all other requirements Future Appointments Date Type Provider Dept  05/08/24 Appointment Bladyn Tipps K, NP [facility]  Showing future appointments within next 90 days and meeting all other requirements  I discussed the assessment and treatment plan with the patient. The patient was provided an opportunity to ask questions and all were answered. The patient agreed with the plan and demonstrated an understanding of the instructions.  Patient advised to call back or seek an  in-person evaluation if the symptoms or condition worsens.  Duration of encounter: *** minutes.  Total time on encounter, as per AMA guidelines included both the face-to-face and non-face-to-face time personally spent by the physician and/or other qualified health care professional(s) on the day of the encounter (includes time in activities that require the physician or other qualified health care professional and does not include time in activities normally performed by clinical staff). Physician's time may include the following activities when performed: Preparing to see the patient (e.g., pre-charting review of records, searching for previously ordered imaging, lab work, and nerve conduction tests) Review of prior analgesic pharmacotherapies. Reviewing PMP Interpreting ordered tests (e.g., lab work, imaging, nerve conduction tests) Performing post-procedure evaluations, including interpretation of diagnostic procedures Obtaining and/or reviewing separately obtained history Performing a medically appropriate examination and/or evaluation Counseling and educating the patient/family/caregiver Ordering medications, tests, or procedures Referring and communicating with other health care professionals (when not separately reported) Documenting clinical information in the electronic or other health record Independently interpreting results (not separately reported) and communicating results to the patient/ family/caregiver Care coordination (not separately reported)  Note by: Kolbey Teichert K [place], NP (TTS and AI technology used. I apologize for any typographical errors that were not detected and corrected.) Date: 05/08/2024; Time: 1:54 PM

## 2024-05-08 ENCOUNTER — Ambulatory Visit (HOSPITAL_BASED_OUTPATIENT_CLINIC_OR_DEPARTMENT_OTHER): Payer: Self-pay | Admitting: Nurse Practitioner

## 2024-05-08 DIAGNOSIS — G894 Chronic pain syndrome: Secondary | ICD-10-CM

## 2024-05-08 DIAGNOSIS — Z91199 Patient's noncompliance with other medical treatment and regimen due to unspecified reason: Secondary | ICD-10-CM

## 2024-05-12 ENCOUNTER — Ambulatory Visit: Admitting: Neurosurgery

## 2024-05-12 DIAGNOSIS — M5416 Radiculopathy, lumbar region: Secondary | ICD-10-CM

## 2024-05-12 DIAGNOSIS — M51361 Other intervertebral disc degeneration, lumbar region with lower extremity pain only: Secondary | ICD-10-CM

## 2024-05-12 DIAGNOSIS — M48061 Spinal stenosis, lumbar region without neurogenic claudication: Secondary | ICD-10-CM

## 2024-05-13 NOTE — Progress Notes (Signed)
 I had a follow-up phone visit with Mr. Peake and his spouse.  They were at home and I was in the office.  Gave consent to go forward with a phone visit.  We discussed his ongoing radiculopathy.  This has been severely impacting his quality of life.  He had a recent injection which did not give him any significant relief.  He does feel like he is continuing to have worsening weakness and worsened overall functional status.  Given these findings and concern for continued decline we will have him come to clinic for formal evaluation.  As we discussed in clinic he may require a lumbar decompression for his significant lumbar stenosis and radiculopathy symptoms.  Will plan to see him as early as Wednesday for reevaluation, scheduling team will be reaching out.  Penne MICAEL Sharps, MD  Spent a total of 15 minutes on the phone call today, we discussed his ongoing radicular symptoms, worsening quality of life, progression of pain.  And came up with  plan moving forward.

## 2024-05-14 ENCOUNTER — Encounter: Payer: Self-pay | Admitting: Nurse Practitioner

## 2024-05-14 ENCOUNTER — Ambulatory Visit: Admitting: Nurse Practitioner

## 2024-05-14 ENCOUNTER — Ambulatory Visit: Admitting: Neurosurgery

## 2024-05-14 VITALS — BP 116/75 | HR 84 | Temp 97.2°F | Ht 65.0 in | Wt 180.0 lb

## 2024-05-14 DIAGNOSIS — M4726 Other spondylosis with radiculopathy, lumbar region: Secondary | ICD-10-CM

## 2024-05-14 DIAGNOSIS — Z79899 Other long term (current) drug therapy: Secondary | ICD-10-CM | POA: Insufficient documentation

## 2024-05-14 DIAGNOSIS — M1612 Unilateral primary osteoarthritis, left hip: Secondary | ICD-10-CM | POA: Diagnosis not present

## 2024-05-14 DIAGNOSIS — M48061 Spinal stenosis, lumbar region without neurogenic claudication: Secondary | ICD-10-CM

## 2024-05-14 DIAGNOSIS — M48062 Spinal stenosis, lumbar region with neurogenic claudication: Secondary | ICD-10-CM

## 2024-05-14 DIAGNOSIS — G894 Chronic pain syndrome: Secondary | ICD-10-CM | POA: Diagnosis not present

## 2024-05-14 DIAGNOSIS — M47816 Spondylosis without myelopathy or radiculopathy, lumbar region: Secondary | ICD-10-CM

## 2024-05-14 DIAGNOSIS — M533 Sacrococcygeal disorders, not elsewhere classified: Secondary | ICD-10-CM

## 2024-05-14 DIAGNOSIS — G5702 Lesion of sciatic nerve, left lower limb: Secondary | ICD-10-CM | POA: Diagnosis not present

## 2024-05-14 DIAGNOSIS — G8929 Other chronic pain: Secondary | ICD-10-CM

## 2024-05-14 MED ORDER — OXYCODONE-ACETAMINOPHEN 10-325 MG PO TABS: 1.0000 | ORAL_TABLET | Freq: Three times a day (TID) | ORAL | 0 refills | Status: AC | PRN

## 2024-05-14 MED ORDER — PREGABALIN 75 MG PO CAPS: 75.0000 mg | ORAL_CAPSULE | Freq: Every evening | ORAL | 2 refills | Status: AC | PRN

## 2024-05-14 NOTE — Patient Instructions (Signed)

## 2024-05-14 NOTE — Progress Notes (Signed)
 Safety precautions to be maintained throughout the outpatient stay will include: orient to surroundings, keep bed in low position, maintain call bell within reach at all times, provide assistance with transfer out of bed and ambulation.   Nursing Pain Medication Assessment:  Safety precautions to be maintained throughout the outpatient stay will include: orient to surroundings, keep bed in low position, maintain call bell within reach at all times, provide assistance with transfer out of bed and ambulation.  Medication Inspection Compliance: Pill count conducted under aseptic conditions, in front of the patient. Neither the pills nor the bottle was removed from the patient's sight at any time. Once count was completed pills were immediately returned to the patient in their original bottle.  Medication: Oxycodone /APAP Pill/Patch Count: No pills/patches available to be counted. Pill/Patch Appearance: No markings Bottle Appearance: No container available. Did not bring bottle(s) to appointment. Filled Date: no container / no container / 2025 Last Medication intake:  Today  Instructed pt to always bring bottle to appointments

## 2024-05-15 ENCOUNTER — Telehealth: Payer: Self-pay | Admitting: Sleep Medicine

## 2024-05-15 NOTE — Telephone Encounter (Unsigned)
 Copied from CRM #[id number]. Topic: Clinical - Medical Advice >> May 15, 2024 10:31 AM Leila C wrote: Reason for CRM: Patient [phone] states went to pain management appointment yesterday, and was speaking with the providers about having a light, annoying headaches started in the last few weeks, was advised to have cpap settings needs to be changed. Patient states there's issues with thewater in the tubing, water residue around the face. Per CAL, patient needs to be seen with Dr. Jess to review his cpap readings, send crm to clinical on cpap machine settings.Unable to scheduled in [facility]: Reddy, Pallavi D, MD in [facility]: Provider does not match visit type restrictions. Patient cannot come int this week and asking for next week. Scheduled with Dr. Jess 05/19/24 at 11:30 am. Please advise and call back.

## 2024-05-16 ENCOUNTER — Ambulatory Visit: Admitting: Neurosurgery

## 2024-05-16 NOTE — Telephone Encounter (Signed)
 Spoke with pt and let him know that his tube temp has been increased which should hopefully help with any dryness and headaches in the morning, and that Dr. Jess said his events have improved but will require further testing. Pt was appreciative and said he will be bringing in machine on Monday to have it looked over. Spoke with [employer] about his water running out quickly or not at all during some nights and they advised he speak with us .   NFN.

## 2024-05-19 ENCOUNTER — Ambulatory Visit: Admitting: Sleep Medicine

## 2024-05-30 ENCOUNTER — Ambulatory Visit: Admitting: Neurosurgery

## 2024-06-09 ENCOUNTER — Encounter: Admitting: Nurse Practitioner
